# Patient Record
Sex: Female | Born: 1975 | Race: White | Hispanic: No | Smoking: Never smoker
Health system: Southern US, Community
[De-identification: ages and names within clinical notes are randomized; demographics above are authoritative.]

## PROBLEM LIST (undated history)

## (undated) ENCOUNTER — Inpatient Hospital Stay (HOSPITAL_COMMUNITY): Payer: Self-pay

## (undated) VITALS — BP 155/97 | HR 91 | Temp 98.1°F | Resp 18 | Ht 66.0 in | Wt 178.5 lb

## (undated) DIAGNOSIS — B999 Unspecified infectious disease: Secondary | ICD-10-CM

## (undated) DIAGNOSIS — R87619 Unspecified abnormal cytological findings in specimens from cervix uteri: Secondary | ICD-10-CM

## (undated) DIAGNOSIS — F319 Bipolar disorder, unspecified: Secondary | ICD-10-CM

## (undated) DIAGNOSIS — F32A Depression, unspecified: Secondary | ICD-10-CM

## (undated) DIAGNOSIS — F1010 Alcohol abuse, uncomplicated: Secondary | ICD-10-CM

## (undated) DIAGNOSIS — F329 Major depressive disorder, single episode, unspecified: Secondary | ICD-10-CM

## (undated) DIAGNOSIS — I471 Supraventricular tachycardia, unspecified: Secondary | ICD-10-CM

## (undated) DIAGNOSIS — IMO0002 Reserved for concepts with insufficient information to code with codable children: Secondary | ICD-10-CM

## (undated) DIAGNOSIS — N83209 Unspecified ovarian cyst, unspecified side: Secondary | ICD-10-CM

## (undated) DIAGNOSIS — A6000 Herpesviral infection of urogenital system, unspecified: Secondary | ICD-10-CM

## (undated) DIAGNOSIS — I10 Essential (primary) hypertension: Secondary | ICD-10-CM

## (undated) DIAGNOSIS — F259 Schizoaffective disorder, unspecified: Secondary | ICD-10-CM

## (undated) DIAGNOSIS — F419 Anxiety disorder, unspecified: Secondary | ICD-10-CM

## (undated) HISTORY — PX: OTHER SURGICAL HISTORY: SHX169

## (undated) HISTORY — DX: Depression, unspecified: F32.A

## (undated) HISTORY — DX: Herpesviral infection of urogenital system, unspecified: A60.00

## (undated) HISTORY — PX: KNEE SURGERY: SHX244

## (undated) HISTORY — PX: BREAST ENHANCEMENT SURGERY: SHX7

## (undated) HISTORY — DX: Essential (primary) hypertension: I10

## (undated) HISTORY — DX: Major depressive disorder, single episode, unspecified: F32.9

## (undated) HISTORY — PX: TONSILLECTOMY: SUR1361

## (undated) HISTORY — PX: WISDOM TOOTH EXTRACTION: SHX21

## (undated) HISTORY — PX: BREAST SURGERY: SHX581

---

## 1998-03-25 DIAGNOSIS — A6000 Herpesviral infection of urogenital system, unspecified: Secondary | ICD-10-CM

## 1998-03-25 HISTORY — DX: Herpesviral infection of urogenital system, unspecified: A60.00

## 1998-11-22 ENCOUNTER — Inpatient Hospital Stay (HOSPITAL_COMMUNITY): Admission: RE | Admit: 1998-11-22 | Discharge: 1998-11-22 | Payer: Self-pay | Admitting: Obstetrics

## 1998-12-04 ENCOUNTER — Encounter: Admission: RE | Admit: 1998-12-04 | Discharge: 1998-12-04 | Payer: Self-pay | Admitting: Internal Medicine

## 1998-12-07 ENCOUNTER — Encounter: Admission: RE | Admit: 1998-12-07 | Discharge: 1998-12-07 | Payer: Self-pay | Admitting: Obstetrics

## 1999-01-15 ENCOUNTER — Encounter: Admission: RE | Admit: 1999-01-15 | Discharge: 1999-01-15 | Payer: Self-pay | Admitting: Internal Medicine

## 2000-11-03 ENCOUNTER — Emergency Department (HOSPITAL_COMMUNITY): Admission: EM | Admit: 2000-11-03 | Discharge: 2000-11-03 | Payer: Self-pay | Admitting: Emergency Medicine

## 2000-11-03 ENCOUNTER — Encounter: Payer: Self-pay | Admitting: Emergency Medicine

## 2004-04-22 ENCOUNTER — Emergency Department: Payer: Self-pay | Admitting: Emergency Medicine

## 2007-04-23 ENCOUNTER — Emergency Department (HOSPITAL_COMMUNITY): Admission: EM | Admit: 2007-04-23 | Discharge: 2007-04-23 | Payer: Self-pay | Admitting: Emergency Medicine

## 2007-11-11 ENCOUNTER — Emergency Department (HOSPITAL_COMMUNITY): Admission: EM | Admit: 2007-11-11 | Discharge: 2007-11-11 | Payer: Self-pay | Admitting: Emergency Medicine

## 2008-06-07 ENCOUNTER — Emergency Department (HOSPITAL_COMMUNITY): Admission: EM | Admit: 2008-06-07 | Discharge: 2008-06-07 | Payer: Self-pay | Admitting: Internal Medicine

## 2008-11-15 ENCOUNTER — Emergency Department (HOSPITAL_COMMUNITY): Admission: EM | Admit: 2008-11-15 | Discharge: 2008-11-15 | Payer: Self-pay | Admitting: Emergency Medicine

## 2008-12-26 ENCOUNTER — Emergency Department (HOSPITAL_COMMUNITY): Admission: EM | Admit: 2008-12-26 | Discharge: 2008-12-26 | Payer: Self-pay | Admitting: Emergency Medicine

## 2009-01-31 ENCOUNTER — Inpatient Hospital Stay (HOSPITAL_COMMUNITY): Admission: AD | Admit: 2009-01-31 | Discharge: 2009-02-19 | Payer: Self-pay | Admitting: Psychiatry

## 2009-01-31 ENCOUNTER — Emergency Department (HOSPITAL_COMMUNITY): Admission: EM | Admit: 2009-01-31 | Discharge: 2009-01-31 | Payer: Self-pay | Admitting: Emergency Medicine

## 2009-01-31 ENCOUNTER — Ambulatory Visit: Payer: Self-pay | Admitting: Psychiatry

## 2009-02-05 ENCOUNTER — Emergency Department (HOSPITAL_COMMUNITY): Admission: EM | Admit: 2009-02-05 | Discharge: 2009-02-05 | Payer: Self-pay | Admitting: Emergency Medicine

## 2009-04-27 ENCOUNTER — Emergency Department (HOSPITAL_COMMUNITY): Admission: EM | Admit: 2009-04-27 | Discharge: 2009-04-28 | Payer: Self-pay | Admitting: Emergency Medicine

## 2009-04-28 ENCOUNTER — Inpatient Hospital Stay (HOSPITAL_COMMUNITY): Admission: RE | Admit: 2009-04-28 | Discharge: 2009-05-03 | Payer: Self-pay | Admitting: Psychiatry

## 2009-04-28 ENCOUNTER — Ambulatory Visit: Payer: Self-pay | Admitting: Psychiatry

## 2009-05-04 ENCOUNTER — Emergency Department (HOSPITAL_COMMUNITY): Admission: EM | Admit: 2009-05-04 | Discharge: 2009-05-05 | Payer: Self-pay | Admitting: Emergency Medicine

## 2009-07-01 ENCOUNTER — Emergency Department (HOSPITAL_COMMUNITY): Admission: EM | Admit: 2009-07-01 | Discharge: 2009-07-01 | Payer: Self-pay | Admitting: Emergency Medicine

## 2009-07-11 ENCOUNTER — Emergency Department (HOSPITAL_COMMUNITY): Admission: EM | Admit: 2009-07-11 | Discharge: 2009-07-11 | Payer: Self-pay | Admitting: Emergency Medicine

## 2009-07-11 ENCOUNTER — Inpatient Hospital Stay (HOSPITAL_COMMUNITY): Admission: EM | Admit: 2009-07-11 | Discharge: 2009-07-14 | Payer: Self-pay | Admitting: Psychiatry

## 2009-07-11 ENCOUNTER — Ambulatory Visit: Payer: Self-pay | Admitting: Psychiatry

## 2009-10-06 ENCOUNTER — Emergency Department (HOSPITAL_COMMUNITY): Admission: EM | Admit: 2009-10-06 | Discharge: 2009-10-07 | Payer: Self-pay | Admitting: Emergency Medicine

## 2009-10-07 ENCOUNTER — Ambulatory Visit: Payer: Self-pay | Admitting: Psychiatry

## 2009-10-17 ENCOUNTER — Emergency Department (HOSPITAL_COMMUNITY): Admission: EM | Admit: 2009-10-17 | Discharge: 2009-10-17 | Payer: Self-pay | Admitting: Emergency Medicine

## 2009-10-30 ENCOUNTER — Ambulatory Visit: Payer: Self-pay | Admitting: Psychiatry

## 2010-04-22 ENCOUNTER — Emergency Department (HOSPITAL_COMMUNITY)
Admission: EM | Admit: 2010-04-22 | Discharge: 2010-04-22 | Payer: Self-pay | Source: Home / Self Care | Admitting: Emergency Medicine

## 2010-05-24 ENCOUNTER — Emergency Department (HOSPITAL_COMMUNITY)
Admission: EM | Admit: 2010-05-24 | Discharge: 2010-05-24 | Disposition: A | Discharge status: Home / Self Care | Payer: Self-pay | Attending: Emergency Medicine | Admitting: Emergency Medicine

## 2010-05-24 DIAGNOSIS — K5289 Other specified noninfective gastroenteritis and colitis: Secondary | ICD-10-CM | POA: Insufficient documentation

## 2010-05-24 DIAGNOSIS — R5383 Other fatigue: Secondary | ICD-10-CM | POA: Insufficient documentation

## 2010-05-24 DIAGNOSIS — R630 Anorexia: Secondary | ICD-10-CM | POA: Insufficient documentation

## 2010-05-24 DIAGNOSIS — R1012 Left upper quadrant pain: Secondary | ICD-10-CM | POA: Insufficient documentation

## 2010-05-24 DIAGNOSIS — R509 Fever, unspecified: Secondary | ICD-10-CM | POA: Insufficient documentation

## 2010-05-24 DIAGNOSIS — R5381 Other malaise: Secondary | ICD-10-CM | POA: Insufficient documentation

## 2010-05-24 DIAGNOSIS — R112 Nausea with vomiting, unspecified: Secondary | ICD-10-CM | POA: Insufficient documentation

## 2010-05-24 DIAGNOSIS — R197 Diarrhea, unspecified: Secondary | ICD-10-CM | POA: Insufficient documentation

## 2010-05-24 LAB — BASIC METABOLIC PANEL
BUN: 8 mg/dL (ref 6–23)
CO2: 24 mEq/L (ref 19–32)
Chloride: 107 mEq/L (ref 96–112)
GFR calc non Af Amer: >60 mL/min (ref >60)
Sodium: 139 mEq/L (ref 135–145)

## 2010-06-09 LAB — CBC
Hemoglobin: 13.5 g/dL (ref 12.0–15.0)
MCHC: 34.9 g/dL (ref 30.0–36.0)
MCV: 91.8 fL (ref 78.0–100.0)
Platelets: 279 10*3/uL (ref 150–400)
WBC: 8.5 10*3/uL (ref 4.0–10.5)

## 2010-06-09 LAB — ETHANOL: Alcohol, Ethyl (B): 156 mg/dL — ABNORMAL HIGH (ref 0–10)

## 2010-06-09 LAB — DIFFERENTIAL
Basophils Relative: 0 % (ref 0–1)
Eosinophils Absolute: 0.1 10*3/uL (ref 0.0–0.7)
Lymphs Abs: 1.7 10*3/uL (ref 0.7–4.0)
Monocytes Absolute: 0.3 10*3/uL (ref 0.1–1.0)
Neutro Abs: 6.4 10*3/uL (ref 1.7–7.7)
Neutrophils Relative %: 76 % (ref 43–77)

## 2010-06-09 LAB — RAPID URINE DRUG SCREEN, HOSP PERFORMED
Benzodiazepines: NOT DETECTED
Cocaine: POSITIVE — AB

## 2010-06-09 LAB — POCT I-STAT, CHEM 8
Calcium, Ion: 1.08 mmol/L — ABNORMAL LOW (ref 1.12–1.32)
Potassium: 3.8 mEq/L (ref 3.5–5.1)
TCO2: 20 mmol/L (ref 0–100)

## 2010-06-09 LAB — TRICYCLICS SCREEN, URINE: TCA Scrn: NOT DETECTED

## 2010-06-12 LAB — CBC
Hemoglobin: 15.2 g/dL — ABNORMAL HIGH (ref 12.0–15.0)
MCHC: 33.5 g/dL (ref 30.0–36.0)
WBC: 11.5 10*3/uL — ABNORMAL HIGH (ref 4.0–10.5)

## 2010-06-12 LAB — RAPID URINE DRUG SCREEN, HOSP PERFORMED
Amphetamines: NOT DETECTED
Barbiturates: NOT DETECTED
Benzodiazepines: NOT DETECTED

## 2010-06-12 LAB — COMPREHENSIVE METABOLIC PANEL
AST: 30 U/L (ref 0–37)
Albumin: 5 g/dL (ref 3.5–5.2)
Alkaline Phosphatase: 69 U/L (ref 39–117)
BUN: 6 mg/dL (ref 6–23)
CO2: 24 mEq/L (ref 19–32)
Creatinine, Ser: 0.8 mg/dL (ref 0.4–1.2)
Glucose, Bld: 98 mg/dL (ref 70–99)
Potassium: 3.5 mEq/L (ref 3.5–5.1)
Sodium: 141 mEq/L (ref 135–145)
Total Protein: 8.5 g/dL — ABNORMAL HIGH (ref 6.0–8.3)

## 2010-06-13 LAB — COMPREHENSIVE METABOLIC PANEL
Albumin: 4.6 g/dL (ref 3.5–5.2)
BUN: 8 mg/dL (ref 6–23)
CO2: 24 mEq/L (ref 19–32)
Calcium: 9.6 mg/dL (ref 8.4–10.5)
Chloride: 101 mEq/L (ref 96–112)
Glucose, Bld: 82 mg/dL (ref 70–99)
Sodium: 138 mEq/L (ref 135–145)

## 2010-06-13 LAB — CBC
HCT: 42.8 % (ref 36.0–46.0)
Hemoglobin: 14.9 g/dL (ref 12.0–15.0)
WBC: 14 10*3/uL — ABNORMAL HIGH (ref 4.0–10.5)

## 2010-06-13 LAB — DIFFERENTIAL
Basophils Absolute: 0 10*3/uL (ref 0.0–0.1)
Eosinophils Absolute: 0.2 10*3/uL (ref 0.0–0.7)
Eosinophils Relative: 1 % (ref 0–5)
Monocytes Relative: 6 % (ref 3–12)

## 2010-06-13 LAB — BASIC METABOLIC PANEL
CO2: 28 mEq/L (ref 19–32)
Calcium: 8.5 mg/dL (ref 8.4–10.5)
Creatinine, Ser: 0.96 mg/dL (ref 0.4–1.2)
GFR calc Af Amer: >60 mL/min (ref >60)
GFR calc non Af Amer: >60 mL/min (ref >60)
Sodium: 137 mEq/L (ref 135–145)

## 2010-06-13 LAB — URINALYSIS, ROUTINE W REFLEX MICROSCOPIC
Bilirubin Urine: NEGATIVE
Bilirubin Urine: NEGATIVE
Glucose, UA: NEGATIVE mg/dL
Ketones, ur: NEGATIVE mg/dL
Ketones, ur: NEGATIVE mg/dL
Nitrite: NEGATIVE
Protein, ur: NEGATIVE mg/dL
Protein, ur: NEGATIVE mg/dL
Specific Gravity, Urine: 1.007 (ref 1.005–1.030)
Urobilinogen, UA: 0.2 mg/dL (ref 0.0–1.0)
pH: 6 (ref 5.0–8.0)
pH: 6 (ref 5.0–8.0)

## 2010-06-13 LAB — URINE MICROSCOPIC-ADD ON

## 2010-06-13 LAB — SALICYLATE LEVEL: Salicylate Lvl: <4 mg/dL (ref 2.8–20.0)

## 2010-06-13 LAB — GLUCOSE, CAPILLARY: Glucose-Capillary: 104 mg/dL — ABNORMAL HIGH (ref 70–99)

## 2010-06-13 LAB — RAPID URINE DRUG SCREEN, HOSP PERFORMED
Barbiturates: NOT DETECTED
Benzodiazepines: POSITIVE — AB
Cocaine: NOT DETECTED
Cocaine: POSITIVE — AB
Tetrahydrocannabinol: NOT DETECTED

## 2010-06-13 LAB — POCT PREGNANCY, URINE: Preg Test, Ur: NEGATIVE

## 2010-06-13 LAB — PREGNANCY, URINE
Preg Test, Ur: NEGATIVE
Preg Test, Ur: NEGATIVE

## 2010-06-13 LAB — ACETAMINOPHEN LEVEL: Acetaminophen (Tylenol), Serum: <10 ug/mL — ABNORMAL LOW (ref 10–30)

## 2010-06-14 LAB — RAPID STREP SCREEN (MED CTR MEBANE ONLY): Streptococcus, Group A Screen (Direct): NEGATIVE

## 2010-06-27 LAB — URINALYSIS, ROUTINE W REFLEX MICROSCOPIC
Bilirubin Urine: NEGATIVE
Bilirubin Urine: NEGATIVE
Glucose, UA: NEGATIVE mg/dL
Glucose, UA: NEGATIVE mg/dL
Hgb urine dipstick: NEGATIVE
Ketones, ur: NEGATIVE mg/dL
Ketones, ur: NEGATIVE mg/dL
Leukocytes, UA: NEGATIVE
Nitrite: NEGATIVE
Protein, ur: NEGATIVE mg/dL
Protein, ur: NEGATIVE mg/dL
Specific Gravity, Urine: 1.009 (ref 1.005–1.030)
Urobilinogen, UA: 0.2 mg/dL (ref 0.0–1.0)
pH: 7.5 (ref 5.0–8.0)
pH: 6.5 (ref 5.0–8.0)

## 2010-06-27 LAB — BASIC METABOLIC PANEL WITH GFR
BUN: 12 mg/dL (ref 6–23)
Chloride: 103 meq/L (ref 96–112)
GFR calc Af Amer: >60 mL/min (ref >60)
Potassium: 3.7 meq/L (ref 3.5–5.1)
Sodium: 136 meq/L (ref 135–145)

## 2010-06-27 LAB — HEPATIC FUNCTION PANEL
Alkaline Phosphatase: 52 U/L (ref 39–117)
Bilirubin, Direct: 0.2 mg/dL (ref 0.0–0.3)
Indirect Bilirubin: 0.4 mg/dL (ref 0.3–0.9)
Total Protein: 6.2 g/dL (ref 6.0–8.3)

## 2010-06-27 LAB — DIFFERENTIAL
Basophils Absolute: 0.1 K/uL (ref 0.0–0.1)
Basophils Relative: 1 % (ref 0–1)
Eosinophils Absolute: 0.2 10*3/uL (ref 0.0–0.7)
Eosinophils Relative: 3 % (ref 0–5)
Lymphocytes Relative: 26 % (ref 12–46)
Lymphs Abs: 2.1 10*3/uL (ref 0.7–4.0)
Monocytes Absolute: 0.4 K/uL (ref 0.1–1.0)
Monocytes Relative: 5 % (ref 3–12)
Neutro Abs: 5.2 K/uL (ref 1.7–7.7)
Neutrophils Relative %: 66 % (ref 43–77)

## 2010-06-27 LAB — CBC
HCT: 42 % (ref 36.0–46.0)
Hemoglobin: 14.6 g/dL (ref 12.0–15.0)
MCHC: 34.8 g/dL (ref 30.0–36.0)
MCV: 91.5 fL (ref 78.0–100.0)
Platelets: 175 K/uL (ref 150–400)
RBC: 4.59 MIL/uL (ref 3.87–5.11)
RDW: 13 % (ref 11.5–15.5)
WBC: 8 K/uL (ref 4.0–10.5)

## 2010-06-27 LAB — BASIC METABOLIC PANEL
CO2: 23 mEq/L (ref 19–32)
Calcium: 9 mg/dL (ref 8.4–10.5)
Creatinine, Ser: 0.82 mg/dL (ref 0.4–1.2)
GFR calc non Af Amer: >60 mL/min (ref >60)
Glucose, Bld: 90 mg/dL (ref 70–99)

## 2010-06-27 LAB — HEMOCCULT GUIAC POC 1CARD (OFFICE): Fecal Occult Bld: POSITIVE

## 2010-06-27 LAB — PREGNANCY, URINE: Preg Test, Ur: NEGATIVE

## 2010-06-27 LAB — RAPID URINE DRUG SCREEN, HOSP PERFORMED
Amphetamines: NOT DETECTED
Barbiturates: NOT DETECTED
Benzodiazepines: POSITIVE — AB
Cocaine: NOT DETECTED
Opiates: NOT DETECTED
Tetrahydrocannabinol: NOT DETECTED

## 2010-06-27 LAB — URINE MICROSCOPIC-ADD ON

## 2010-06-27 LAB — ETHANOL: Alcohol, Ethyl (B): 157 mg/dL — ABNORMAL HIGH (ref 0–10)

## 2010-06-27 LAB — ACETAMINOPHEN LEVEL: Acetaminophen (Tylenol), Serum: <10 ug/mL — ABNORMAL LOW (ref 10–30)

## 2010-06-27 LAB — SALICYLATE LEVEL
Salicylate Lvl: 16.6 mg/dL (ref 2.8–20.0)
Salicylate Lvl: 20.1 mg/dL — ABNORMAL HIGH (ref 2.8–20.0)

## 2010-06-28 LAB — BASIC METABOLIC PANEL
CO2: 27 mEq/L (ref 19–32)
Calcium: 9 mg/dL (ref 8.4–10.5)
Creatinine, Ser: 0.74 mg/dL (ref 0.4–1.2)
GFR calc Af Amer: >60 mL/min (ref >60)
Sodium: 138 mEq/L (ref 135–145)

## 2010-06-28 LAB — RAPID URINE DRUG SCREEN, HOSP PERFORMED
Amphetamines: NOT DETECTED
Benzodiazepines: POSITIVE — AB
Cocaine: NOT DETECTED
Opiates: NOT DETECTED
Tetrahydrocannabinol: NOT DETECTED

## 2010-06-28 LAB — DIFFERENTIAL
Lymphocytes Relative: 16 % (ref 12–46)
Lymphs Abs: 2 10*3/uL (ref 0.7–4.0)
Monocytes Absolute: 0.7 10*3/uL (ref 0.1–1.0)
Monocytes Relative: 5 % (ref 3–12)
Neutro Abs: 10 10*3/uL — ABNORMAL HIGH (ref 1.7–7.7)
Neutrophils Relative %: 79 % — ABNORMAL HIGH (ref 43–77)

## 2010-06-28 LAB — URINALYSIS, ROUTINE W REFLEX MICROSCOPIC
Bilirubin Urine: NEGATIVE
Ketones, ur: NEGATIVE mg/dL
Nitrite: NEGATIVE
Protein, ur: NEGATIVE mg/dL
Urobilinogen, UA: 0.2 mg/dL (ref 0.0–1.0)
pH: 6 (ref 5.0–8.0)

## 2010-06-28 LAB — CBC
Hemoglobin: 13.6 g/dL (ref 12.0–15.0)
RBC: 4.29 MIL/uL (ref 3.87–5.11)
WBC: 12.7 10*3/uL — ABNORMAL HIGH (ref 4.0–10.5)

## 2010-06-28 LAB — ETHANOL: Alcohol, Ethyl (B): 108 mg/dL — ABNORMAL HIGH (ref 0–10)

## 2010-06-28 LAB — PREGNANCY, URINE: Preg Test, Ur: NEGATIVE

## 2010-06-30 LAB — POCT I-STAT, CHEM 8
BUN: 9 mg/dL (ref 6–23)
Creatinine, Ser: 0.8 mg/dL (ref 0.4–1.2)
Potassium: 3.6 mEq/L (ref 3.5–5.1)
Sodium: 139 mEq/L (ref 135–145)

## 2010-06-30 LAB — URINALYSIS, ROUTINE W REFLEX MICROSCOPIC
Bilirubin Urine: NEGATIVE
Ketones, ur: 15 mg/dL — AB
Specific Gravity, Urine: 1.017 (ref 1.005–1.030)
Urobilinogen, UA: 0.2 mg/dL (ref 0.0–1.0)

## 2010-06-30 LAB — RAPID URINE DRUG SCREEN, HOSP PERFORMED
Amphetamines: NOT DETECTED
Barbiturates: NOT DETECTED
Cocaine: NOT DETECTED
Opiates: NOT DETECTED

## 2010-06-30 LAB — URINE MICROSCOPIC-ADD ON

## 2010-07-05 LAB — URINALYSIS, ROUTINE W REFLEX MICROSCOPIC
Glucose, UA: NEGATIVE mg/dL
Specific Gravity, Urine: 1.016 (ref 1.005–1.030)

## 2010-07-05 LAB — WET PREP, GENITAL
Trich, Wet Prep: NONE SEEN
Yeast Wet Prep HPF POC: NONE SEEN

## 2010-07-05 LAB — GC/CHLAMYDIA PROBE AMP, GENITAL: GC Probe Amp, Genital: NEGATIVE

## 2010-07-05 LAB — URINE MICROSCOPIC-ADD ON

## 2010-07-05 LAB — POCT I-STAT, CHEM 8
BUN: 9 mg/dL (ref 6–23)
Creatinine, Ser: 1 mg/dL (ref 0.4–1.2)
Hemoglobin: 13.9 g/dL (ref 12.0–15.0)
Potassium: 4 mEq/L (ref 3.5–5.1)
Sodium: 138 mEq/L (ref 135–145)

## 2010-07-05 LAB — RAPID URINE DRUG SCREEN, HOSP PERFORMED: Barbiturates: NOT DETECTED

## 2010-09-12 ENCOUNTER — Emergency Department (HOSPITAL_COMMUNITY): Payer: Self-pay

## 2010-09-12 ENCOUNTER — Emergency Department (HOSPITAL_COMMUNITY)
Admission: EM | Admit: 2010-09-12 | Discharge: 2010-09-12 | Disposition: A | Discharge status: Home / Self Care | Payer: Self-pay | Attending: Emergency Medicine | Admitting: Emergency Medicine

## 2010-09-12 DIAGNOSIS — N739 Female pelvic inflammatory disease, unspecified: Secondary | ICD-10-CM | POA: Insufficient documentation

## 2010-09-12 DIAGNOSIS — R0602 Shortness of breath: Secondary | ICD-10-CM | POA: Insufficient documentation

## 2010-09-12 DIAGNOSIS — K5730 Diverticulosis of large intestine without perforation or abscess without bleeding: Secondary | ICD-10-CM | POA: Insufficient documentation

## 2010-09-12 DIAGNOSIS — R109 Unspecified abdominal pain: Secondary | ICD-10-CM | POA: Insufficient documentation

## 2010-09-12 DIAGNOSIS — R911 Solitary pulmonary nodule: Secondary | ICD-10-CM | POA: Insufficient documentation

## 2010-09-12 LAB — URINALYSIS, ROUTINE W REFLEX MICROSCOPIC
Glucose, UA: NEGATIVE mg/dL
Ketones, ur: NEGATIVE mg/dL
Leukocytes, UA: NEGATIVE
pH: 5.5 (ref 5.0–8.0)

## 2010-09-12 LAB — DIFFERENTIAL
Basophils Absolute: 0.1 10*3/uL (ref 0.0–0.1)
Basophils Relative: 1 % (ref 0–1)
Eosinophils Relative: 1 % (ref 0–5)
Monocytes Absolute: 0.6 10*3/uL (ref 0.1–1.0)
Neutro Abs: 4.9 10*3/uL (ref 1.7–7.7)

## 2010-09-12 LAB — COMPREHENSIVE METABOLIC PANEL
ALT: 19 U/L (ref 0–35)
AST: 19 U/L (ref 0–37)
Albumin: 3.7 g/dL (ref 3.5–5.2)
Alkaline Phosphatase: 71 U/L (ref 39–117)
Glucose, Bld: 103 mg/dL — ABNORMAL HIGH (ref 70–99)
Potassium: 3.6 mEq/L (ref 3.5–5.1)
Sodium: 140 mEq/L (ref 135–145)
Total Protein: 6.7 g/dL (ref 6.0–8.3)

## 2010-09-12 LAB — WET PREP, GENITAL
Trich, Wet Prep: NONE SEEN
Yeast Wet Prep HPF POC: NONE SEEN

## 2010-09-12 LAB — CBC
MCHC: 35.4 g/dL (ref 30.0–36.0)
Platelets: 255 10*3/uL (ref 150–400)
RDW: 12.5 % (ref 11.5–15.5)

## 2010-09-12 LAB — URINE MICROSCOPIC-ADD ON

## 2010-09-12 MED ORDER — IOHEXOL 300 MG/ML  SOLN
80.0000 mL | Freq: Once | INTRAMUSCULAR | Status: AC | PRN
  Administered 2010-09-12: 80 mL via INTRAVENOUS

## 2010-09-21 ENCOUNTER — Emergency Department (HOSPITAL_COMMUNITY): Payer: Self-pay

## 2010-09-21 ENCOUNTER — Emergency Department (HOSPITAL_COMMUNITY)
Admission: EM | Admit: 2010-09-21 | Discharge: 2010-09-21 | Disposition: A | Discharge status: Home / Self Care | Payer: Self-pay | Attending: Emergency Medicine | Admitting: Emergency Medicine

## 2010-09-21 DIAGNOSIS — R51 Headache: Secondary | ICD-10-CM | POA: Insufficient documentation

## 2010-09-21 DIAGNOSIS — N390 Urinary tract infection, site not specified: Secondary | ICD-10-CM | POA: Insufficient documentation

## 2010-09-21 LAB — BASIC METABOLIC PANEL
Chloride: 102 mEq/L (ref 96–112)
Creatinine, Ser: 0.87 mg/dL (ref 0.50–1.10)
GFR calc Af Amer: >60 mL/min (ref >60)
GFR calc non Af Amer: >60 mL/min (ref >60)

## 2010-09-21 LAB — URINE MICROSCOPIC-ADD ON

## 2010-09-21 LAB — CBC
MCHC: 35.1 g/dL (ref 30.0–36.0)
MCV: 89.9 fL (ref 78.0–100.0)
Platelets: 303 10*3/uL (ref 150–400)
RDW: 12.9 % (ref 11.5–15.5)
WBC: 9.5 10*3/uL (ref 4.0–10.5)

## 2010-09-21 LAB — DIFFERENTIAL
Basophils Absolute: 0 10*3/uL (ref 0.0–0.1)
Eosinophils Absolute: 0.3 10*3/uL (ref 0.0–0.7)
Eosinophils Relative: 3 % (ref 0–5)
Lymphs Abs: 2 10*3/uL (ref 0.7–4.0)
Monocytes Absolute: 1 10*3/uL (ref 0.1–1.0)

## 2010-09-21 LAB — URINALYSIS, ROUTINE W REFLEX MICROSCOPIC
Bilirubin Urine: NEGATIVE
Specific Gravity, Urine: 1.025 (ref 1.005–1.030)
pH: 5.5 (ref 5.0–8.0)

## 2010-09-21 LAB — POCT PREGNANCY, URINE: Preg Test, Ur: NEGATIVE

## 2010-10-14 ENCOUNTER — Emergency Department (HOSPITAL_COMMUNITY)
Admission: EM | Admit: 2010-10-14 | Discharge: 2010-10-14 | Disposition: A | Discharge status: Nursing Home (Includes ICF) | Payer: No Typology Code available for payment source | Attending: Emergency Medicine | Admitting: Emergency Medicine

## 2010-10-14 DIAGNOSIS — N949 Unspecified condition associated with female genital organs and menstrual cycle: Secondary | ICD-10-CM | POA: Insufficient documentation

## 2010-10-14 DIAGNOSIS — R109 Unspecified abdominal pain: Secondary | ICD-10-CM | POA: Insufficient documentation

## 2010-10-22 ENCOUNTER — Emergency Department (HOSPITAL_COMMUNITY)
Admission: EM | Admit: 2010-10-22 | Discharge: 2010-10-22 | Discharge status: Against Medical Advice | Payer: Self-pay | Attending: Emergency Medicine | Admitting: Emergency Medicine

## 2010-10-23 ENCOUNTER — Emergency Department (HOSPITAL_COMMUNITY): Payer: Self-pay

## 2010-10-23 ENCOUNTER — Emergency Department (HOSPITAL_COMMUNITY)
Admission: EM | Admit: 2010-10-23 | Discharge: 2010-10-23 | Disposition: A | Discharge status: Home / Self Care | Payer: Self-pay | Attending: Emergency Medicine | Admitting: Emergency Medicine

## 2010-10-23 DIAGNOSIS — F1910 Other psychoactive substance abuse, uncomplicated: Secondary | ICD-10-CM | POA: Insufficient documentation

## 2010-10-23 DIAGNOSIS — R110 Nausea: Secondary | ICD-10-CM | POA: Insufficient documentation

## 2010-10-23 DIAGNOSIS — M25539 Pain in unspecified wrist: Secondary | ICD-10-CM | POA: Insufficient documentation

## 2010-10-23 LAB — URINALYSIS, ROUTINE W REFLEX MICROSCOPIC
Bilirubin Urine: NEGATIVE
Glucose, UA: NEGATIVE mg/dL
Ketones, ur: NEGATIVE mg/dL
Leukocytes, UA: NEGATIVE
Nitrite: NEGATIVE
Specific Gravity, Urine: 1.028 (ref 1.005–1.030)
pH: 7 (ref 5.0–8.0)

## 2010-10-23 LAB — CBC
HCT: 38.2 % (ref 36.0–46.0)
MCHC: 35.6 g/dL (ref 30.0–36.0)
Platelets: 312 10*3/uL (ref 150–400)
RDW: 12.2 % (ref 11.5–15.5)
WBC: 12.5 10*3/uL — ABNORMAL HIGH (ref 4.0–10.5)

## 2010-10-23 LAB — DIFFERENTIAL
Basophils Absolute: 0 10*3/uL (ref 0.0–0.1)
Basophils Relative: 0 % (ref 0–1)
Eosinophils Absolute: 0 10*3/uL (ref 0.0–0.7)
Eosinophils Relative: 0 % (ref 0–5)
Lymphocytes Relative: 19 % (ref 12–46)
Monocytes Absolute: 0.8 10*3/uL (ref 0.1–1.0)

## 2010-10-23 LAB — POCT PREGNANCY, URINE: Preg Test, Ur: NEGATIVE

## 2010-10-23 LAB — BASIC METABOLIC PANEL
Calcium: 9.1 mg/dL (ref 8.4–10.5)
GFR calc Af Amer: >60 mL/min (ref >60)
GFR calc non Af Amer: >60 mL/min (ref >60)
Potassium: 3.5 mEq/L (ref 3.5–5.1)
Sodium: 135 mEq/L (ref 135–145)

## 2010-10-23 LAB — RAPID URINE DRUG SCREEN, HOSP PERFORMED
Barbiturates: NOT DETECTED
Benzodiazepines: NOT DETECTED
Cocaine: NOT DETECTED

## 2010-10-23 LAB — URINE MICROSCOPIC-ADD ON

## 2010-10-23 LAB — ETHANOL: Alcohol, Ethyl (B): 68 mg/dL — ABNORMAL HIGH (ref 0–11)

## 2010-11-26 ENCOUNTER — Emergency Department (HOSPITAL_COMMUNITY)
Admission: EM | Admit: 2010-11-26 | Discharge: 2010-11-26 | Disposition: A | Discharge status: Home / Self Care | Payer: Self-pay | Attending: Emergency Medicine | Admitting: Emergency Medicine

## 2010-11-26 DIAGNOSIS — L02219 Cutaneous abscess of trunk, unspecified: Secondary | ICD-10-CM | POA: Insufficient documentation

## 2010-11-26 DIAGNOSIS — R21 Rash and other nonspecific skin eruption: Secondary | ICD-10-CM | POA: Insufficient documentation

## 2010-11-27 ENCOUNTER — Emergency Department (HOSPITAL_COMMUNITY)
Admission: EM | Admit: 2010-11-27 | Discharge: 2010-11-28 | Disposition: A | Discharge status: Other Acute Inpatient Hospital | Payer: Self-pay | Attending: Emergency Medicine | Admitting: Emergency Medicine

## 2010-11-27 DIAGNOSIS — F489 Nonpsychotic mental disorder, unspecified: Secondary | ICD-10-CM | POA: Insufficient documentation

## 2010-11-27 DIAGNOSIS — S61509A Unspecified open wound of unspecified wrist, initial encounter: Secondary | ICD-10-CM | POA: Insufficient documentation

## 2010-11-27 DIAGNOSIS — X789XXA Intentional self-harm by unspecified sharp object, initial encounter: Secondary | ICD-10-CM | POA: Insufficient documentation

## 2010-11-27 LAB — CBC
HCT: 38.8 % (ref 36.0–46.0)
MCHC: 35.3 g/dL (ref 30.0–36.0)
Platelets: 291 10*3/uL (ref 150–400)
RDW: 11.9 % (ref 11.5–15.5)
WBC: 7.7 10*3/uL (ref 4.0–10.5)

## 2010-11-27 LAB — DIFFERENTIAL
Basophils Absolute: 0 10*3/uL (ref 0.0–0.1)
Basophils Relative: 0 % (ref 0–1)
Eosinophils Absolute: 0.1 10*3/uL (ref 0.0–0.7)
Monocytes Absolute: 0.2 10*3/uL (ref 0.1–1.0)
Monocytes Relative: 3 % (ref 3–12)
Neutro Abs: 5.5 10*3/uL (ref 1.7–7.7)
Neutrophils Relative %: 71 % (ref 43–77)

## 2010-11-27 LAB — RAPID URINE DRUG SCREEN, HOSP PERFORMED
Amphetamines: NOT DETECTED
Benzodiazepines: NOT DETECTED
Cocaine: POSITIVE — AB

## 2010-11-27 LAB — COMPREHENSIVE METABOLIC PANEL
AST: 21 U/L (ref 0–37)
Albumin: 3.5 g/dL (ref 3.5–5.2)
Alkaline Phosphatase: 90 U/L (ref 39–117)
BUN: 10 mg/dL (ref 6–23)
Chloride: 106 mEq/L (ref 96–112)
Creatinine, Ser: 0.79 mg/dL (ref 0.50–1.10)
Potassium: 3.5 mEq/L (ref 3.5–5.1)
Total Bilirubin: 0.1 mg/dL — ABNORMAL LOW (ref 0.3–1.2)
Total Protein: 7.3 g/dL (ref 6.0–8.3)

## 2010-11-27 LAB — POCT PREGNANCY, URINE: Preg Test, Ur: NEGATIVE

## 2010-11-27 LAB — ETHANOL: Alcohol, Ethyl (B): 270 mg/dL — ABNORMAL HIGH (ref 0–11)

## 2010-11-28 ENCOUNTER — Inpatient Hospital Stay (HOSPITAL_COMMUNITY)
Admission: AD | Admit: 2010-11-28 | Discharge: 2010-12-03 | DRG: 885 | Disposition: A | Discharge status: Home / Self Care | Payer: PRIVATE HEALTH INSURANCE | Source: Ambulatory Visit | Attending: Psychiatry | Admitting: Psychiatry

## 2010-11-28 DIAGNOSIS — Z5900 Homelessness unspecified: Secondary | ICD-10-CM

## 2010-11-28 DIAGNOSIS — R51 Headache: Secondary | ICD-10-CM

## 2010-11-28 DIAGNOSIS — Z880 Allergy status to penicillin: Secondary | ICD-10-CM

## 2010-11-28 DIAGNOSIS — F19939 Other psychoactive substance use, unspecified with withdrawal, unspecified: Secondary | ICD-10-CM

## 2010-11-28 DIAGNOSIS — R45851 Suicidal ideations: Secondary | ICD-10-CM

## 2010-11-28 DIAGNOSIS — L089 Local infection of the skin and subcutaneous tissue, unspecified: Secondary | ICD-10-CM

## 2010-11-28 DIAGNOSIS — F411 Generalized anxiety disorder: Secondary | ICD-10-CM

## 2010-11-28 DIAGNOSIS — F39 Unspecified mood [affective] disorder: Principal | ICD-10-CM

## 2010-11-28 DIAGNOSIS — F1020 Alcohol dependence, uncomplicated: Secondary | ICD-10-CM

## 2010-11-28 DIAGNOSIS — Z111 Encounter for screening for respiratory tuberculosis: Secondary | ICD-10-CM

## 2010-11-28 DIAGNOSIS — F1120 Opioid dependence, uncomplicated: Secondary | ICD-10-CM

## 2010-11-29 DIAGNOSIS — F1920 Other psychoactive substance dependence, uncomplicated: Secondary | ICD-10-CM

## 2010-11-29 DIAGNOSIS — F1020 Alcohol dependence, uncomplicated: Secondary | ICD-10-CM

## 2010-12-03 NOTE — Discharge Summary (Signed)
NAMERYENN, HOWETH NO.:  [id number]  MEDICAL RECORD NO.:  [id number]  LOCATION:  0301                          FACILITY:  [facility]  PHYSICIAN:  Orson Aloe, MD       DATE OF BIRTH:  May 29, 1975  DATE OF ADMISSION:  11/28/2010 DATE OF DISCHARGE:  12/03/2010                              DISCHARGE SUMMARY   REASON FOR ADMISSION:  This the fourth [facility] admission since 2010 for Megan Edwards who has previously been completely abstinent for about 8 months from all of her substances and then gradually relapsed.  She unclear now about how long she has been using substances.  She was picked up by the police and was intoxicated and found to have a BAL of 270 and was combative and agitated in the emergency room.  While she was there, she called her mother and told her mother that she is going to kill herself as she left the emergency room.  She was transferred to [facility] for ongoing treatment.  She reports that she has been using cocaine a few times in the past week but this is very atypical for her and she does not typically  abuse  cocaine.  She has been using approximately 20 tablets of 10 mg Norco per day, crushing and snorting them several times several weeks ago and she did not use IV heroin.  She has been drinking large amounts of alcohol, as much as she can get her hands on.  She denies any legal problems.  She is currently homeless. She denies suicidal thoughts today.  Significant laboratory is urine screen was positive for cocaine. Alcohol was BAL.  CBC was normal.  She does report that she has had some outpatient testing for HIV and some other STDs and tests which were negative.  DISCHARGE DIAGNOSES:  AXIS I:  Mood disorder not otherwise specified, alcohol dependence, opiate dependence. AXIS II:  Deferred. AXIS III:  Headache not otherwise specified, rule out opiate withdrawal headache and skin infection not otherwise specified. AXIS IV:  Severe social  issues and housing issues. AXIS V:  50.  HOSPITAL COURSE:  During the hospital course, the patient was placed on detox and noted to have a good response to that.  She received Zofran for nausea and was also started on Tegretol 100 mg twice a day and was increased to 200 mg twice a day for mood regulation.  She had responded well to Tegretol in the past.  For pain, depression and sleep, she was placed on Elavil 25 mg at bedtime with good results.  Trazodone was also used for sleep and it was considered helpful.  Imitrex 50 mg tablets was ordered for headaches.  Actually Fioricet was considered at one point and Imitrex subcu was given for headache.  CONDITION ON DISCHARGE:  She denied any suicidal or homicidal ideation. She denied hallucinations, illusions, delusions.  She had good eye contact.  She was able to focus for lengthy interchange and interview and as well as in groups.  She had clear goal-directed thoughts.  She had natural conversational speech volume, rate and tone.  She is oriented x4, had recent, remote memory which was intact.  Judgment  was considered able to construct a reasonably safe discharge placement by applying and going to the [facility] program.  [facility]ight was improved from admission.  DISCHARGE RECOMMENDATIONS: 1. Resume typical activities. 2. Resume typical diet. 3. Continue amitriptyline 25 mg at bedtime, Tegretol 10 mg twice a day     for mood stabilization as well as Keflex 500 mg 4 times a day to     finish the prescription and doxycycline 100 mg also to finish the     prescription at a twice a day schedule and trazodone 100 mg at     bedtime.  She was to stop taking Lamictal and Klonopin.  She was     also follow up at the [facility] program and received assistance in     getting transported there.          ______________________________ Orson Aloe, MD     EW/MEDQ  D:  12/03/2010  T:  12/03/2010  Job:  [id number]  Electronically Signed by Orson Aloe  on  12/03/2010 05:18:12 PM

## 2010-12-03 NOTE — Assessment & Plan Note (Signed)
NAMEDENISS, WORMLEY NO.:  [id number]  MEDICAL RECORD NO.:  [id number]  LOCATION:  0301                          FACILITY:  [facility]  PHYSICIAN:  Orson Aloe, MD       DATE OF BIRTH:  1975/05/05  DATE OF ADMISSION:  11/28/2010 DATE OF DISCHARGE:                      PSYCHIATRIC ADMISSION ASSESSMENT   IDENTIFICATION:  This is a 35 year old Caucasian female.  This is a voluntary admission.  HISTORY OF PRESENT ILLNESS:  This the fourth [facility] admission since 2010 for [facility] who had  previously been completely abstinent for about 8 months from all substances and then gradually relapsed.  She is unclear now about how long she has been using substances.  She [place] police [place] the [place]  intoxicated and found to have an alcohol level of 270 mg/dL and was combative and agitated in the emergency room.  While there she called her mother and told her mother that she was going to kill herself as soon as she left the emergency room.  She was transferred to [facility]  for ongoing treatment.  Shadoe reports that she used cocaine a few times in the past week but this is very atypical for her and she does not typically abuse cocaine. She has been using approximately 20 tablets of 10 mg Norco per date crushing and snorting although several weeks ago she did use some IV heroin..  She has been drinking large amounts of alcohol, as much as she could get her hands on.  She denies any legal problems.  She is currently homeless.  She denies suicidal thoughts today.  PAST PSYCHIATRIC HISTORY:  Fourth [facility] admission.  No current outpatient treatment. Last [facility] admission was in April 2011. After that stay she went to the [facility] program, then came back at her own apartment and was working, had her life organized and was able to stay abstinent an additional 8 months.  She has a history of IV heroin abuse and alcohol abuse.  Previous medication trials include Prozac which she  reports worked quite well for her,  helped stabilize her mood and reinforced her sobriety.  She has also had trazodone in the past for sleep.  SOCIAL HISTORY:  This is a single Caucasian female with no current legal charges, currently homeless.  Mother does live in the area and is supportive.  Shamicka is currently unemployed.  She has no children.  FAMILY HISTORY:  Not available.  MEDICAL HISTORY:  No regular primary care provider. Current medical problems are skin infection NOS.  MEDICATIONS:  Home medications:  Keflex 500 mg four times a day, duration unclear,  and doxycycline 100 mg b.i.d. duration unclear.  ALLERGIES:  DRUG ALLERGIES ARE PENICILLIN AND TRAMADOL.  PHYSICAL EXAMINATION:  Positive physical findings:  This is a normally- developed Caucasian female, fair skin with light blonde hair.  She weighs 63 kg,  5 feet 6 inches tall.  No abnormal movements.  Normal gait.  She does not appear to be in acute withdrawal but is complaining of significant headache at this time which  she reports is typical for her with drug withdrawal.  She scores the headache a 7-8/10 with photophobia and phonophobia.  Full  physical exam was done in the emergency room and is noted in the record.  Her urine drug screen was positive for cocaine.  Alcohol level 270 mg/dL and CBC normal.  She does report that she has had some outpatient testing for HIV and other STDs and tests were negative..  MENTAL STATUS EXAM:  Fully alert female, cooperative, blunt affect. Speech is normal and  nonpressured.  Gives a coherent history.  Mood is mildly depressed, some guilt and regret about the relapse but she is looking forward today,  formulating a plan for getting her life stabilized and getting back into a drug treatment program.  Denies any suicidal thoughts today.  Cognitively she is completely intact.  AXIS I:  Mood disorder NOS.  Alcohol dependence.  Opiate dependence. AXIS II:  Deferred. AXIS III:   Headache NOS, rule out opiate withdrawal headache, skin infection NOS. AXIS IV:  Severe social issues. AXIS V:  Current 40,  past year not known.  PLAN:  Plan is to voluntarily admit her with goal of alleviating her suicidal thoughts and giving her a safe detox from opiates and alcohol. We started her on a Librium protocol,  a clonidine protocol for her withdrawal symptoms.  We are going to give her 30 mg of Toradol IM x one today for the headache with 25 mg of Phenergan followed by oral Fioricet as needed for the headache and she has been started on Tegretol 100 mg p.o. b.i.d. for mood stability and we will  consider adding Prozac.     Margaret A. Lorin Picket, N.P.   ______________________________ Orson Aloe, MD    MAS/MEDQ  D:  11/30/2010  T:  11/30/2010  Job:  [id number]  Electronically Signed by Kari Baars N.P. on 12/03/2010 08:43:37 AM Electronically Signed by Orson Aloe  on 12/03/2010 11:40:59 AM

## 2011-08-09 ENCOUNTER — Emergency Department (HOSPITAL_COMMUNITY)
Admission: EM | Admit: 2011-08-09 | Discharge: 2011-08-10 | Disposition: A | Discharge status: Other Acute Inpatient Hospital | Payer: Self-pay | Attending: Emergency Medicine | Admitting: Emergency Medicine

## 2011-08-09 ENCOUNTER — Encounter (HOSPITAL_COMMUNITY): Payer: Self-pay

## 2011-08-09 DIAGNOSIS — F1010 Alcohol abuse, uncomplicated: Secondary | ICD-10-CM | POA: Insufficient documentation

## 2011-08-09 DIAGNOSIS — F17200 Nicotine dependence, unspecified, uncomplicated: Secondary | ICD-10-CM | POA: Insufficient documentation

## 2011-08-09 DIAGNOSIS — F3289 Other specified depressive episodes: Secondary | ICD-10-CM | POA: Insufficient documentation

## 2011-08-09 DIAGNOSIS — F1110 Opioid abuse, uncomplicated: Secondary | ICD-10-CM | POA: Insufficient documentation

## 2011-08-09 DIAGNOSIS — F329 Major depressive disorder, single episode, unspecified: Secondary | ICD-10-CM

## 2011-08-09 DIAGNOSIS — F1910 Other psychoactive substance abuse, uncomplicated: Secondary | ICD-10-CM

## 2011-08-09 LAB — RAPID URINE DRUG SCREEN, HOSP PERFORMED
Amphetamines: NOT DETECTED
Benzodiazepines: NOT DETECTED
Cocaine: POSITIVE — AB
Opiates: NOT DETECTED

## 2011-08-09 LAB — URINALYSIS, ROUTINE W REFLEX MICROSCOPIC
Bilirubin Urine: NEGATIVE
Glucose, UA: NEGATIVE mg/dL
Hgb urine dipstick: NEGATIVE
Protein, ur: NEGATIVE mg/dL

## 2011-08-09 LAB — COMPREHENSIVE METABOLIC PANEL
Albumin: 3.6 g/dL (ref 3.5–5.2)
Alkaline Phosphatase: 99 U/L (ref 39–117)
BUN: 9 mg/dL (ref 6–23)
Calcium: 8.5 mg/dL (ref 8.4–10.5)
Creatinine, Ser: 0.75 mg/dL (ref 0.50–1.10)
GFR calc Af Amer: >90 mL/min (ref >90)
Glucose, Bld: 131 mg/dL — ABNORMAL HIGH (ref 70–99)
Total Protein: 7.1 g/dL (ref 6.0–8.3)

## 2011-08-09 LAB — CBC
HCT: 42.3 % (ref 36.0–46.0)
Hemoglobin: 14.8 g/dL (ref 12.0–15.0)
MCH: 30.6 pg (ref 26.0–34.0)
MCHC: 35 g/dL (ref 30.0–36.0)
RDW: 12.5 % (ref 11.5–15.5)

## 2011-08-09 LAB — ETHANOL: Alcohol, Ethyl (B): 256 mg/dL — ABNORMAL HIGH (ref 0–11)

## 2011-08-09 MED ORDER — LORAZEPAM 1 MG PO TABS
1.0000 mg | ORAL_TABLET | Freq: Three times a day (TID) | ORAL | Status: DC | PRN
  Administered 2011-08-10: 1 mg via ORAL
  Filled 2011-08-09: qty 1

## 2011-08-09 NOTE — ED Notes (Signed)
Patient has 2 bags at nurse's station. Patient has changed into blue scrubs and wanded by security.

## 2011-08-09 NOTE — ED Notes (Signed)
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 Patient states she last used methadone 2 days ago and drank 4 40's beer today. Patient states she last had detox 3 years ago.

## 2011-08-09 NOTE — ED Provider Notes (Signed)
History     CSN: [id number]  Arrival date & time 08/09/11  1726   First MD Initiated Contact with Patient 08/09/11 1907      Chief Complaint  Patient presents with  . Medical Clearance    requesting detox from methadone and alcohol    (Consider location/radiation/quality/duration/timing/severity/associated sxs/prior treatment) The history is provided by the patient.  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 States initially got on pain meds after knee injury. Has been in methadone program via [facility]. Pt denies other narcotic abuse or bzd abuse. States today drank total of 4 40 ounce beers. States drinks most days, and when stops feels shaky. Denies hx seizures or dts. Does note feelings of depression, denies thoughts of harm to self or SI. Denies any recent other health problems or other symptoms. No nvd. No abd pain. No fall, pain or injury. No fever or chills.  History reviewed. No pertinent past medical history.  Past Surgical History  Procedure Date  . Knee surgery     History reviewed. No pertinent family history.  History  Substance Use Topics  . Smoking status: Current Everyday Smoker    Types: Cigarettes  . Smokeless tobacco: Not on file  . Alcohol Use: Yes     4 40's beer daily    OB History    Grav Para Term Preterm Abortions TAB SAB Ect Mult Living                  Review of Systems  Constitutional: Negative for fever and chills.  HENT: Negative for neck pain.   Eyes: Negative for pain.  Respiratory: Negative for shortness of breath.   Cardiovascular: Negative for chest pain.  Gastrointestinal: Negative for abdominal pain.  Genitourinary: Negative for flank pain.  Musculoskeletal: Negative for back pain.  Skin: Negative for rash.  Neurological: Negative for headaches.  Hematological: Does not bruise/bleed easily.  Psychiatric/Behavioral: Negative for confusion.    Allergies  Penicillins  Home Medications  No  current outpatient prescriptions on file.  BP 127/87  Pulse 104  Temp(Src) 98.5 F (36.9 C) (Oral)  Resp 22  SpO2 96%  LMP 08/07/2011  Physical Exam  Nursing note and vitals reviewed. Constitutional: She is oriented to person, place, and time. She appears well-developed and well-nourished. No distress.  HENT:  Head: Atraumatic.  Eyes: Pupils are equal, round, and reactive to light. No scleral icterus.  Neck: Neck supple. No tracheal deviation present.  Cardiovascular: Normal rate, regular rhythm, normal heart sounds and intact distal pulses.   Pulmonary/Chest: Effort normal and breath sounds normal. No respiratory distress.  Abdominal: Soft. Normal appearance and bowel sounds are normal. She exhibits no distension. There is no tenderness.  Musculoskeletal: She exhibits no edema.  Neurological: She is alert and oriented to person, place, and time.       Motor intact bil. Steady gait.   Skin: Skin is warm and dry. No rash noted.  Psychiatric:       Flat affect, depressed mood.     ED Course  Procedures (including critical care time)  Labs Reviewed  COMPREHENSIVE METABOLIC PANEL - Abnormal; Notable for the following:    Glucose, Bld 131 (*)    Total Bilirubin 0.1 (*)    All other components within normal limits  ETHANOL - Abnormal; Notable for the following:    Alcohol, Ethyl (B) 256 (*)    All other components within normal limits  CBC  URINE RAPID DRUG SCREEN (  HOSP PERFORMED)  URINALYSIS, ROUTINE W REFLEX MICROSCOPIC  URINE RAPID DRUG SCREEN (HOSP PERFORMED)   Results for orders placed during the hospital encounter of 08/09/11  CBC      Component Value Range   WBC 8.9  4.0 - 10.5 (K/uL)   RBC 4.83  3.87 - 5.11 (MIL/uL)   Hemoglobin 14.8  12.0 - 15.0 (g/dL)   HCT 16.1  09.6 - 04.5 (%)   MCV 87.6  78.0 - 100.0 (fL)   MCH 30.6  26.0 - 34.0 (pg)   MCHC 35.0  30.0 - 36.0 (g/dL)   RDW 40.9  81.1 - 91.4 (%)   Platelets 324  150 - 400 (K/uL)  COMPREHENSIVE METABOLIC  PANEL      Component Value Range   Sodium 136  135 - 145 (mEq/L)   Potassium 3.9  3.5 - 5.1 (mEq/L)   Chloride 101  96 - 112 (mEq/L)   CO2 25  19 - 32 (mEq/L)   Glucose, Bld 131 (*) 70 - 99 (mg/dL)   BUN 9  6 - 23 (mg/dL)   Creatinine, Ser 7.82  0.50 - 1.10 (mg/dL)   Calcium 8.5  8.4 - 95.6 (mg/dL)   Total Protein 7.1  6.0 - 8.3 (g/dL)   Albumin 3.6  3.5 - 5.2 (g/dL)   AST 22  0 - 37 (U/L)   ALT 18  0 - 35 (U/L)   Alkaline Phosphatase 99  39 - 117 (U/L)   Total Bilirubin 0.1 (*) 0.3 - 1.2 (mg/dL)   GFR calc non Af Amer >90  >90 (mL/min)   GFR calc Af Amer >90  >90 (mL/min)  URINE RAPID DRUG SCREEN (HOSP PERFORMED)      Component Value Range   Opiates NONE DETECTED  NONE DETECTED    Cocaine POSITIVE (*) NONE DETECTED    Benzodiazepines NONE DETECTED  NONE DETECTED    Amphetamines NONE DETECTED  NONE DETECTED    Tetrahydrocannabinol NONE DETECTED  NONE DETECTED    Barbiturates NONE DETECTED  NONE DETECTED   URINALYSIS, ROUTINE W REFLEX MICROSCOPIC      Component Value Range   Color, Urine YELLOW  YELLOW    APPearance CLOUDY (*) CLEAR    Specific Gravity, Urine 1.011  1.005 - 1.030    pH 6.0  5.0 - 8.0    Glucose, UA NEGATIVE  NEGATIVE (mg/dL)   Hgb urine dipstick NEGATIVE  NEGATIVE    Bilirubin Urine NEGATIVE  NEGATIVE    Ketones, ur NEGATIVE  NEGATIVE (mg/dL)   Protein, ur NEGATIVE  NEGATIVE (mg/dL)   Urobilinogen, UA 0.2  0.0 - 1.0 (mg/dL)   Nitrite NEGATIVE  NEGATIVE    Leukocytes, UA NEGATIVE  NEGATIVE   ETHANOL      Component Value Range   Alcohol, Ethyl (B) 256 (*) 0 - 11 (mg/dL)       MDM  Labs.   Reviewed prior charts and nursing notes for additional hx.    Recheck awake and alert, no tremor or shakes.    Discussed w act team, their eval is pending.  Will sign out to oncoming edp to f/u with act/psych eval, recheck pt, and dispo appropriately.        Suzi Roots, MD 08/09/11 2010

## 2011-08-10 ENCOUNTER — Inpatient Hospital Stay (HOSPITAL_COMMUNITY)
Admission: EM | Admit: 2011-08-10 | Discharge: 2011-08-20 | DRG: 897 | Disposition: A | Discharge status: Home / Self Care | Payer: No Typology Code available for payment source | Source: Ambulatory Visit | Attending: Psychiatry | Admitting: Psychiatry

## 2011-08-10 DIAGNOSIS — F1994 Other psychoactive substance use, unspecified with psychoactive substance-induced mood disorder: Secondary | ICD-10-CM | POA: Diagnosis present

## 2011-08-10 DIAGNOSIS — F19939 Other psychoactive substance use, unspecified with withdrawal, unspecified: Principal | ICD-10-CM | POA: Diagnosis present

## 2011-08-10 DIAGNOSIS — F10939 Alcohol use, unspecified with withdrawal, unspecified: Secondary | ICD-10-CM | POA: Diagnosis present

## 2011-08-10 DIAGNOSIS — F1120 Opioid dependence, uncomplicated: Secondary | ICD-10-CM | POA: Diagnosis present

## 2011-08-10 DIAGNOSIS — F10239 Alcohol dependence with withdrawal, unspecified: Secondary | ICD-10-CM | POA: Diagnosis present

## 2011-08-10 DIAGNOSIS — F1010 Alcohol abuse, uncomplicated: Secondary | ICD-10-CM | POA: Diagnosis present

## 2011-08-10 DIAGNOSIS — F17200 Nicotine dependence, unspecified, uncomplicated: Secondary | ICD-10-CM | POA: Diagnosis present

## 2011-08-10 DIAGNOSIS — F1920 Other psychoactive substance dependence, uncomplicated: Secondary | ICD-10-CM | POA: Diagnosis present

## 2011-08-10 MED ORDER — DICYCLOMINE HCL 20 MG PO TABS
20.0000 mg | ORAL_TABLET | Freq: Four times a day (QID) | ORAL | Status: DC | PRN
  Administered 2011-08-10: 20 mg via ORAL
  Filled 2011-08-10: qty 1

## 2011-08-10 MED ORDER — ONDANSETRON 4 MG PO TBDP: 4.0000 mg | ORAL_TABLET | Freq: Four times a day (QID) | ORAL | Status: DC | PRN

## 2011-08-10 MED ORDER — CHLORDIAZEPOXIDE HCL 25 MG PO CAPS: 25.0000 mg | ORAL_CAPSULE | Freq: Every day | ORAL | Status: DC

## 2011-08-10 MED ORDER — CLONIDINE HCL 0.1 MG PO TABS: 0.1000 mg | ORAL_TABLET | ORAL | Status: DC

## 2011-08-10 MED ORDER — ADULT MULTIVITAMIN W/MINERALS CH
1.0000 | ORAL_TABLET | Freq: Every day | ORAL | Status: DC
  Administered 2011-08-11 – 2011-08-20 (×10): 1 via ORAL
  Filled 2011-08-10 (×12): qty 1

## 2011-08-10 MED ORDER — CLONIDINE HCL 0.1 MG PO TABS: 0.1000 mg | ORAL_TABLET | Freq: Every day | ORAL | Status: DC

## 2011-08-10 MED ORDER — CHLORDIAZEPOXIDE HCL 25 MG PO CAPS: 25.0000 mg | ORAL_CAPSULE | ORAL | Status: DC

## 2011-08-10 MED ORDER — VITAMIN B-1 100 MG PO TABS
100.0000 mg | ORAL_TABLET | Freq: Every day | ORAL | Status: DC
  Administered 2011-08-11 – 2011-08-20 (×10): 100 mg via ORAL
  Filled 2011-08-10 (×12): qty 1

## 2011-08-10 MED ORDER — LOPERAMIDE HCL 2 MG PO CAPS
2.0000 mg | ORAL_CAPSULE | ORAL | Status: AC | PRN
  Administered 2011-08-13: 4 mg via ORAL
  Administered 2011-08-13: 2 mg via ORAL

## 2011-08-10 MED ORDER — NAPROXEN 500 MG PO TABS
500.0000 mg | ORAL_TABLET | Freq: Two times a day (BID) | ORAL | Status: DC | PRN
  Administered 2011-08-10: 500 mg via ORAL
  Filled 2011-08-10: qty 1

## 2011-08-10 MED ORDER — CHLORDIAZEPOXIDE HCL 25 MG PO CAPS
25.0000 mg | ORAL_CAPSULE | Freq: Four times a day (QID) | ORAL | Status: AC
  Administered 2011-08-10 – 2011-08-12 (×6): 25 mg via ORAL
  Filled 2011-08-10 (×6): qty 1

## 2011-08-10 MED ORDER — THIAMINE HCL 100 MG/ML IJ SOLN
100.0000 mg | Freq: Once | INTRAMUSCULAR | Status: AC
  Administered 2011-08-19: 100 mg via INTRAMUSCULAR

## 2011-08-10 MED ORDER — ONDANSETRON HCL 4 MG PO TABS
4.0000 mg | ORAL_TABLET | Freq: Three times a day (TID) | ORAL | Status: DC | PRN
  Administered 2011-08-10: 4 mg via ORAL
  Filled 2011-08-10: qty 1

## 2011-08-10 MED ORDER — CHLORDIAZEPOXIDE HCL 25 MG PO CAPS
25.0000 mg | ORAL_CAPSULE | Freq: Four times a day (QID) | ORAL | Status: AC | PRN
  Administered 2011-08-10 – 2011-08-13 (×9): 25 mg via ORAL
  Filled 2011-08-10 (×9): qty 1

## 2011-08-10 MED ORDER — LOPERAMIDE HCL 2 MG PO CAPS: 2.0000 mg | ORAL_CAPSULE | ORAL | Status: DC | PRN

## 2011-08-10 MED ORDER — CLONIDINE HCL 0.1 MG PO TABS
0.1000 mg | ORAL_TABLET | Freq: Four times a day (QID) | ORAL | Status: DC
  Administered 2011-08-10 (×3): 0.1 mg via ORAL
  Filled 2011-08-10 (×3): qty 1

## 2011-08-10 MED ORDER — HYDROXYZINE HCL 25 MG PO TABS
25.0000 mg | ORAL_TABLET | Freq: Four times a day (QID) | ORAL | Status: AC | PRN
  Administered 2011-08-10 – 2011-08-13 (×4): 25 mg via ORAL

## 2011-08-10 MED ORDER — CHLORDIAZEPOXIDE HCL 25 MG PO CAPS
25.0000 mg | ORAL_CAPSULE | Freq: Three times a day (TID) | ORAL | Status: DC
  Administered 2011-08-12: 25 mg via ORAL
  Filled 2011-08-10: qty 1

## 2011-08-10 MED ORDER — MAGNESIUM HYDROXIDE 400 MG/5ML PO SUSP: 30.0000 mL | Freq: Every day | ORAL | Status: DC | PRN

## 2011-08-10 MED ORDER — ONDANSETRON 4 MG PO TBDP
4.0000 mg | ORAL_TABLET | Freq: Four times a day (QID) | ORAL | Status: AC | PRN
  Administered 2011-08-11 – 2011-08-12 (×3): 4 mg via ORAL
  Filled 2011-08-10 (×2): qty 1

## 2011-08-10 MED ORDER — ALUM & MAG HYDROXIDE-SIMETH 200-200-20 MG/5ML PO SUSP: 30.0000 mL | ORAL | Status: DC | PRN

## 2011-08-10 MED ORDER — ACETAMINOPHEN 325 MG PO TABS
650.0000 mg | ORAL_TABLET | Freq: Four times a day (QID) | ORAL | Status: DC | PRN
  Administered 2011-08-11: 650 mg via ORAL

## 2011-08-10 MED ORDER — HYDROXYZINE HCL 25 MG PO TABS
25.0000 mg | ORAL_TABLET | Freq: Four times a day (QID) | ORAL | Status: DC | PRN
  Administered 2011-08-10: 25 mg via ORAL
  Filled 2011-08-10: qty 1

## 2011-08-10 MED ORDER — METHOCARBAMOL 500 MG PO TABS
500.0000 mg | ORAL_TABLET | Freq: Three times a day (TID) | ORAL | Status: DC | PRN
  Filled 2011-08-10: qty 1

## 2011-08-10 NOTE — ED Notes (Signed)
Pt has been accepted to [facility], but can not transport until after shift change-per bobby ACT

## 2011-08-10 NOTE — BH Assessment (Addendum)
Per 7 pm shift report, pt has been accepted at [facility] by Verne Spurr PA to Dr Koren Shiver. Bed 307-2. Support paperwork signed and faxed and originals put in pt's chart. RN notified. Pt will go to [facility] after 1930 shift change. EDP notified.

## 2011-08-10 NOTE — ED Notes (Signed)
Up to the bathroom-vomiting
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
## 2011-08-10 NOTE — ED Notes (Signed)
Report given to psych ed. Pt to be moved to room 40 and pink duffle bag and small pt belongings bag will be in activity room.

## 2011-08-10 NOTE — ED Notes (Signed)
Up to the desk on the phone 

## 2011-08-10 NOTE — Tx Team (Signed)
Initial Interdisciplinary Treatment Plan  PATIENT STRENGTHS: (choose at least two) Ability for insight Average or above average intelligence Capable of independent living Motivation for treatment/growth Supportive family/friends Work skills  PATIENT STRESSORS: Financial difficulties Substance abuse   PROBLEM LIST: Problem List/Patient Goals Date to be addressed Date deferred Reason deferred Estimated date of resolution  Substance abuse 08/10/11     Depression 08/10/11                                                DISCHARGE CRITERIA:  Motivation to continue treatment in a less acute level of care Need for constant or close observation no longer present Withdrawal symptoms are absent or subacute and managed without 24-hour nursing intervention  PRELIMINARY DISCHARGE PLAN: Attend 12-step recovery group Return to previous living arrangement Return to previous work or school arrangements  PATIENT/FAMIILY INVOLVEMENT: This treatment plan has been presented to and reviewed with the patient, [employer].  The patient and family have been given the opportunity to ask questions and make suggestions.  Juliann Pares 08/10/2011, 9:58 PM

## 2011-08-10 NOTE — ED Provider Notes (Signed)
Depression and requesting detox from etoh and opiates.  Medically clear for psychiatric evaluation.  On assessment this am requested medication for nausea otherwise had no additional complaints.    RRR Lungs CTAB Abd soft, NT, ND  Labs reviewed and temp holding orders are placed.  Dayton Bailiff, MD 08/10/11 [phone]
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
## 2011-08-11 DIAGNOSIS — F1920 Other psychoactive substance dependence, uncomplicated: Secondary | ICD-10-CM | POA: Diagnosis present

## 2011-08-11 MED ORDER — METHOCARBAMOL 500 MG PO TABS
500.0000 mg | ORAL_TABLET | Freq: Three times a day (TID) | ORAL | Status: DC | PRN
  Administered 2011-08-11 – 2011-08-12 (×3): 500 mg via ORAL
  Filled 2011-08-11 (×3): qty 1

## 2011-08-11 MED ORDER — DICYCLOMINE HCL 20 MG PO TABS
20.0000 mg | ORAL_TABLET | Freq: Four times a day (QID) | ORAL | Status: AC | PRN
  Administered 2011-08-11 – 2011-08-15 (×10): 20 mg via ORAL
  Filled 2011-08-11 (×11): qty 1

## 2011-08-11 MED ORDER — CLONIDINE HCL 0.1 MG PO TABS
0.1000 mg | ORAL_TABLET | Freq: Every day | ORAL | Status: DC
  Filled 2011-08-11 (×2): qty 1

## 2011-08-11 MED ORDER — CLONIDINE HCL 0.1 MG PO TABS
0.1000 mg | ORAL_TABLET | Freq: Four times a day (QID) | ORAL | Status: AC
  Administered 2011-08-11 – 2011-08-12 (×8): 0.1 mg via ORAL
  Filled 2011-08-11 (×8): qty 1

## 2011-08-11 MED ORDER — NAPROXEN 500 MG PO TABS
500.0000 mg | ORAL_TABLET | Freq: Two times a day (BID) | ORAL | Status: DC | PRN
  Administered 2011-08-11 – 2011-08-12 (×3): 500 mg via ORAL
  Filled 2011-08-11 (×3): qty 1

## 2011-08-11 MED ORDER — CARBAMAZEPINE ER 200 MG PO TB12
200.0000 mg | ORAL_TABLET | Freq: Two times a day (BID) | ORAL | Status: DC
  Administered 2011-08-11 – 2011-08-12 (×2): 200 mg via ORAL
  Filled 2011-08-11 (×6): qty 1

## 2011-08-11 MED ORDER — CLONIDINE HCL 0.1 MG PO TABS
0.1000 mg | ORAL_TABLET | ORAL | Status: DC
  Administered 2011-08-13 – 2011-08-14 (×3): 0.1 mg via ORAL
  Filled 2011-08-11 (×5): qty 1

## 2011-08-11 MED ORDER — TRAZODONE HCL 100 MG PO TABS
100.0000 mg | ORAL_TABLET | Freq: Every evening | ORAL | Status: DC | PRN
  Administered 2011-08-11 – 2011-08-12 (×2): 100 mg via ORAL
  Filled 2011-08-11 (×6): qty 1
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
## 2011-08-11 NOTE — Progress Notes (Signed)
Patient ID: Megan Edwards, female   DOB: [birth year], 36 y.o.   MRN: [id number]  Pt has been very flat and depressed on the unit, pt has also reported severe withdrawal symptoms. Pt was given Bentyl and Robaxin for her withdrawal symptoms. Pt reported being negative SI/HI, no AH/VH noted.

## 2011-08-11 NOTE — Progress Notes (Signed)
Patient ID: Megan Edwards, female   DOB: 1975-11-28, 36 y.o.   MRN: [id number]  Pt. did not attend aftercare planning group.

## 2011-08-11 NOTE — Progress Notes (Signed)
Patient ID: Megan Edwards, female   DOB: March 12, 1976, 36 y.o.   MRN: [id number]  [facility] Group Notes:  (Counselor/Nursing/MHT/Case Management/Adjunct)  08/11/2011 1:15 PM  Type of Therapy:  Group Therapy, Dance/Movement Therapy   Participation Level:  Did Not Attend  Rhunette Croft
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
## 2011-08-11 NOTE — Progress Notes (Signed)
[facility] Adult Inpatient Family/Significant Other Suicide Prevention Education  Suicide Prevention Education:  Patient Refusal for Family/Significant Other Suicide Prevention Education: The patient Megan Edwards has refused to provide written consent for family/significant other to be provided Family/Significant Other Suicide Prevention Education during admission and/or prior to discharge.  Physician notified.  Pt. accepted information on suicide prevention, warning signs to look for with suicide and crisis line numbers to use. The pt. agreed to call crisis line numbers if having warning signs or having thoughts of suicide.   Pt stated she has "noone to call".   [facility] 08/11/2011, 11:26 AM

## 2011-08-12 DIAGNOSIS — F39 Unspecified mood [affective] disorder: Secondary | ICD-10-CM

## 2011-08-12 DIAGNOSIS — F1420 Cocaine dependence, uncomplicated: Secondary | ICD-10-CM

## 2011-08-12 DIAGNOSIS — F1020 Alcohol dependence, uncomplicated: Secondary | ICD-10-CM

## 2011-08-12 DIAGNOSIS — F1120 Opioid dependence, uncomplicated: Secondary | ICD-10-CM

## 2011-08-12 MED ORDER — ASPIRIN-ACETAMINOPHEN-CAFFEINE 250-250-65 MG PO TABS
2.0000 | ORAL_TABLET | Freq: Four times a day (QID) | ORAL | Status: DC | PRN
  Administered 2011-08-12 – 2011-08-19 (×8): 2 via ORAL
  Filled 2011-08-12 (×2): qty 2

## 2011-08-12 MED ORDER — ASPIRIN-ACETAMINOPHEN-CAFFEINE 250-250-65 MG PO TABS: 1.0000 | ORAL_TABLET | Freq: Four times a day (QID) | ORAL | Status: DC | PRN

## 2011-08-12 MED ORDER — CHLORDIAZEPOXIDE HCL 25 MG PO CAPS
50.0000 mg | ORAL_CAPSULE | Freq: Three times a day (TID) | ORAL | Status: AC
  Administered 2011-08-12: 25 mg via ORAL
  Filled 2011-08-12: qty 2

## 2011-08-12 MED ORDER — METHOCARBAMOL 500 MG PO TABS
500.0000 mg | ORAL_TABLET | Freq: Four times a day (QID) | ORAL | Status: DC | PRN
  Administered 2011-08-12 – 2011-08-15 (×10): 500 mg via ORAL
  Filled 2011-08-12 (×10): qty 1

## 2011-08-12 MED ORDER — CARBAMAZEPINE 200 MG PO TABS
200.0000 mg | ORAL_TABLET | Freq: Three times a day (TID) | ORAL | Status: DC
  Administered 2011-08-12 – 2011-08-20 (×25): 200 mg via ORAL
  Filled 2011-08-12 (×7): qty 1
  Filled 2011-08-12 (×2): qty 21
  Filled 2011-08-12 (×10): qty 1
  Filled 2011-08-12: qty 21
  Filled 2011-08-12 (×10): qty 1

## 2011-08-12 MED ORDER — CHLORDIAZEPOXIDE HCL 25 MG PO CAPS
50.0000 mg | ORAL_CAPSULE | ORAL | Status: AC
  Administered 2011-08-13 (×2): 50 mg via ORAL
  Filled 2011-08-12: qty 2
  Filled 2011-08-12 (×2): qty 1

## 2011-08-12 MED ORDER — TRAZODONE HCL 100 MG PO TABS
200.0000 mg | ORAL_TABLET | Freq: Every evening | ORAL | Status: DC | PRN
  Administered 2011-08-12 – 2011-08-19 (×8): 200 mg via ORAL
  Filled 2011-08-12 (×12): qty 2
  Filled 2011-08-12: qty 1
  Filled 2011-08-12 (×4): qty 2
  Filled 2011-08-12: qty 1
  Filled 2011-08-12 (×4): qty 2

## 2011-08-12 MED ORDER — CHLORDIAZEPOXIDE HCL 25 MG PO CAPS
50.0000 mg | ORAL_CAPSULE | Freq: Every day | ORAL | Status: AC
  Administered 2011-08-14: 50 mg via ORAL
  Filled 2011-08-12: qty 2

## 2011-08-12 NOTE — Progress Notes (Signed)
Patient ID: Megan Edwards, female   DOB: April 03, 1975, 36 y.o.   MRN: [id number] Spent most of the evening in bed, stated she was achey all over, her back and knees especially, and was given heat packs which seemed to help.  Decided not to come out for group, but did come to med window afterward for her hs meds.  Has been pleasant and cooperative otherwise, planning to try to go to groups tomorrow.  Contracts for safety, denies SI/HI.  Will continue to monitor.
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
## 2011-08-12 NOTE — Progress Notes (Addendum)
[facility] Group Notes:  (Counselor/Nursing/MHT/Case Management/Adjunct)  08/12/2011 1:04 PM  Type of Therapy:  Group Therapy at 11AM  Participation Level:  Active  Participation Quality:  Redirectable and Sharing  Affect:  Angry and Irritable  Cognitive:  Oriented  Insight:  Limited  Engagement in Group:  Good  Engagement in Therapy:  Limited  Modes of Intervention:  Clarification, Limit-setting and Support  Summary of Progress/Problems:  Gracelin shared frustrations of finding herself in detox again.  "I'm at the mercy of what my mother and my job says I have to do and of course she has contacted them." "You folks have to talk to her and she's saying I need long term treatment; she wants me to go for two years."  Others in group offered reality that her job will not be held for two years.  "I don't want to sit through all this again, I could probably teach the damn rehab classes, I won't learn anything new."  Later patient was asked if she felt ready to make changes and she stated "I have to be."  Pt also shared during group that treatment center ([facility] and other unnamed) had put her on methadone once and suboxone another time.   Clide Dales 08/12/2011, 1:04 PM

## 2011-08-12 NOTE — Progress Notes (Signed)
Patient ID: Megan Edwards, female   DOB: 20-Feb-1976, 36 y.o.   MRN: [id number] She has been up and about and to groups interacting with peers and staff. Has requested and received PRN medications throughout the day. Has had little affect for her w/d symptoms.

## 2011-08-12 NOTE — Progress Notes (Signed)
Patient ID: Megan Edwards, female   DOB: 1975/04/30, 36 y.o.   MRN: [id number] Pt did not attend AA group, due to cramping. "I've been in bed most of the day because my stomach has been cramping". Stated she was hoping to have her methodone  decreased gradually, but was informed that "we aren't able to do that anymore".  Pt plans to attend long term treatment after discharge to get off "everything".
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
## 2011-08-13 MED ORDER — GABAPENTIN 100 MG PO CAPS
100.0000 mg | ORAL_CAPSULE | Freq: Three times a day (TID) | ORAL | Status: DC
  Administered 2011-08-13 – 2011-08-16 (×9): 100 mg via ORAL
  Filled 2011-08-13 (×13): qty 1

## 2011-08-13 MED ORDER — LIDOCAINE 5 % EX PTCH
1.0000 | MEDICATED_PATCH | Freq: Once | CUTANEOUS | Status: AC
  Administered 2011-08-13: 1 via TRANSDERMAL
  Filled 2011-08-13 (×2): qty 1

## 2011-08-13 MED ORDER — LIDOCAINE 5 % EX PTCH
1.0000 | MEDICATED_PATCH | CUTANEOUS | Status: DC
  Administered 2011-08-14 – 2011-08-20 (×7): 1 via TRANSDERMAL
  Filled 2011-08-13 (×9): qty 1

## 2011-08-13 NOTE — Progress Notes (Signed)
Patient ID: Megan Edwards, female   DOB: 06/15/1975, 36 y.o.   MRN: [id number]  Pt was more pleasant and responsive today. Stated that she still experiencing cramps, and diffused pain.  "I started to leave this place today and go to the clinic." "Why would you do that". "I don't no man, I'm tired of feeling like this". "You've made a decision to come, your mind is made up, so now is the time to do it".

## 2011-08-13 NOTE — Tx Team (Signed)
Interdisciplinary Treatment Plan Update (Adult)  Date:  08/13/2011  Time Reviewed:  9:57 AM   Progress in Treatment: Attending groups: Yes Participating in groups:  Yes Taking medication as prescribed: Yes Tolerating medication:  Yes Family/Significant othe contact made:  Yes Patient understands diagnosis:  Yes Discussing patient identified problems/goals with staff:  Yes Medical problems stabilized or resolved:  Yes Denies suicidal/homicidal ideation: Yes Issues/concerns per patient self-inventory:  None identified Other: N/A  New problem(s) identified: None Identified  Reason for Continuation of Hospitalization: Anxiety Depression Medication stabilization Withdrawal symptoms  Interventions implemented related to continuation of hospitalization: mood stabilization, medication monitoring and adjustment, group therapy and psycho education, safety checks q 15 mins  Additional comments: N/A  Estimated length of stay: 3-5 days  Discharge Plan: Pt will follow up at [facility] for further treatment.    New goal(s): N/A  Review of initial/current patient goals per problem list:    1.  Goal(s): Address substance use  Met:  No  Target date: by discharge  As evidenced by: completing detox protocol and refer to appropriate treatment  2.  Goal (s): Reduce depressive and anxiety symptoms  Met:  No  Target date: by discharge  As evidenced by: Reducing depression from a 10 to a 3 as reported by pt.    3.  Goal(s): Eliminate SI  Met:  No  Target date: by discharge  As evidenced by: pt denying SI.    Attendees: Patient:  Megan Edwards 08/13/2011 9:59 AM   Family:     Physician:  Lupe Carney, DO 08/13/2011 9:57 AM   Nursing: Izola Price, RN 08/13/2011 9:57 AM   Case Manager:  Reyes Ivan, LCSWA 08/13/2011  9:57 AM   Counselor:  Ronda Fairly, LCSWA 08/13/2011  9:57 AM   Other:  Richelle Ito, LCSW 08/13/2011 9:57 AM   Other:  Robbie Louis, RN  08/13/2011 9:59 AM   Other:  Roswell Miners, RN 08/13/2011 9:59 AM   Other:      Scribe for Treatment Team:   Reyes Ivan 08/13/2011 9:57 AM

## 2011-08-13 NOTE — Progress Notes (Signed)
Pt attended discharge planning group and actively participated.  Pt presents with agitated affect and depressed mood.  Pt states that she is having severe withdrawal today and in pain.  Pt states that she is angry and depressed.  Pt ranks depression and anxiety at an 8-9 today.  Pt denies SI.  Pt states that her family and job lie to her by saying she can come back home once she detoxes, but once she gets here they want her to get further treatment.  Pt states that she will do what she needs to do.  Pt asked if she can transfer to [facility] to complete her detox.  SW explained this is not an option.  Pt is scheduled to go to [facility] next Tuesday.  No further needs voiced by pt at this time.    Reyes Ivan, LCSWA 08/13/2011  10:19 AM

## 2011-08-13 NOTE — Progress Notes (Addendum)
[facility] MD Progress Note  08/13/2011 1:37 PM  Current Mental Status: Patient seen and evaluated in team. Chart reviewed. Patient stated that her mood was "not good". Sig w/d s/s noted off methadone. Her affect was mood congruent and anxious. She denied any current thoughts of self injurious behavior, suicidal ideation or homicidal ideation. There were no auditory or visual hallucinations, paranoia, delusional thought processes, or mania noted. Thought process was linear and goal directed. Psychomotor agitation noted. Speech was normal rate, tone and volume. Eye contact was good. Judgment and insight are fair. Patient has been up and engaged on the unit. No acute safety concerns reported from team. C/o pain, difficulty sleeping and sig w/d s/s off opioids.  Sleep:  Number of Hours: 6.25    Vital Signs:Blood pressure 113/73, pulse 76, temperature 98.2 F (36.8 C), temperature source Oral, resp. rate 20, height 5\' 6"  (1.676 m), weight 80.967 kg (178 lb 8 oz), last menstrual period 08/07/2011.  Lab Results: No results found for this or any previous visit (from the past 48 hour(s)).  Physical Findings: CIWA:  CIWA-Ar Total: 6  COWS:  COWS Total Score: 12   CLINICAL FACTORS: Alcohol Dependence & W/D; Cocaine Dependence; Opioid Dependence & W/D; Mood Disorder NOS; BPAD, per Hx; r/o PD NOS with Cluster B Traits   PLAN OF CARE: Pt admitted for crisis stabilization, detox and treatment. Increased several meds for acute w/d s/s including increasing Librium taper to 50mg  protocol; increased Robaxin to q6hrs; Excedrin Migraine for c/o HAs; increased trazodone to 200mg  per pt report of past efficacy at 200-300mg  po qhs; increased Tegretol to 200mg  tid for improved mood stability and all prn meds reviewed with pt. Added lidocaine patch and Neurontin 100mg  po tid (in light of polypharmacy) today per pt request for pain. Please see orders. Medications reviewed with pt and medication education provided. No clinical  indication for one on one level of observation at this time. Pt contracting for safety. Mental health treatment, medication management and continued sobriety will mitigate against the increased risk of harm to self and/or others. Discussed the importance of recovery with pt, as well as, tools to move forward in a healthy & safe manner. Pt agreeable with the plan. Discussed with the team.   Lupe Carney 08/13/2011, 1:37 PM

## 2011-08-13 NOTE — Progress Notes (Signed)
[facility] Group Notes:  (Counselor/Nursing/MHT/Case Management/Adjunct)  08/13/2011 10:52 PM  Type of Therapy:  Group Therapy at 1:15  Participation Level:  Active  Participation Quality:  Attentive and Sharing  Affect:  Flat and not feeling well  Cognitive:  Oriented  Insight:  Good  Engagement in Group:  Good  Engagement in Therapy:  Good  Modes of Intervention:  Clarification, Education and Support  Summary of Progress/Problems:  Megan Edwards came into group late but willingly joined in discussion on feelings about diagnosis.  "I just don't want to go through all this again and feel like it is kind of hopeless.  I mean I'm not sure I can do this sobriety thing; what is the point of trying?"  Patient was open to concept of giving her addiction an 'idenenity' and perhaps what she is saying is really more of her addiction talking than her.  "Yeah, I know I'll make more sens in a few days once I make it through this withdrawal; but then I go for 'little miss recovery person star' - it's always perfection or failure with me." Patient was encouraged to find the middle ground and knowing that what she is doing today and what she does tomorrow will be enough if she is staying away from a drink or a drug.  Others in group spoke about the concept of 'being enough.'   Clide Dales 08/13/2011, 11:00 PM

## 2011-08-14 DIAGNOSIS — F1120 Opioid dependence, uncomplicated: Secondary | ICD-10-CM | POA: Diagnosis present

## 2011-08-14 MED ORDER — CHLORDIAZEPOXIDE HCL 25 MG PO CAPS
25.0000 mg | ORAL_CAPSULE | Freq: Two times a day (BID) | ORAL | Status: DC | PRN
  Administered 2011-08-15: 25 mg via ORAL
  Filled 2011-08-14 (×3): qty 1

## 2011-08-14 MED ORDER — QUETIAPINE FUMARATE 25 MG PO TABS
ORAL_TABLET | ORAL | Status: AC
  Filled 2011-08-14: qty 1

## 2011-08-14 MED ORDER — CLONIDINE HCL 0.1 MG PO TABS
0.1000 mg | ORAL_TABLET | Freq: Two times a day (BID) | ORAL | Status: DC
  Administered 2011-08-17 – 2011-08-18 (×2): 0.1 mg via ORAL
  Filled 2011-08-14 (×6): qty 1

## 2011-08-14 MED ORDER — QUETIAPINE FUMARATE 25 MG PO TABS
25.0000 mg | ORAL_TABLET | Freq: Three times a day (TID) | ORAL | Status: DC
  Administered 2011-08-14 – 2011-08-16 (×5): 25 mg via ORAL
  Filled 2011-08-14 (×8): qty 1

## 2011-08-14 MED ORDER — CHLORDIAZEPOXIDE HCL 25 MG PO CAPS
25.0000 mg | ORAL_CAPSULE | Freq: Two times a day (BID) | ORAL | Status: DC | PRN
  Administered 2011-08-15 – 2011-08-19 (×9): 25 mg via ORAL
  Filled 2011-08-14 (×7): qty 1

## 2011-08-14 MED ORDER — QUETIAPINE FUMARATE 100 MG PO TABS
100.0000 mg | ORAL_TABLET | Freq: Every day | ORAL | Status: DC
  Administered 2011-08-14 – 2011-08-15 (×2): 100 mg via ORAL
  Filled 2011-08-14 (×5): qty 1

## 2011-08-14 MED ORDER — CHLORDIAZEPOXIDE HCL 25 MG PO CAPS
25.0000 mg | ORAL_CAPSULE | Freq: Two times a day (BID) | ORAL | Status: DC
  Administered 2011-08-14 (×2): 25 mg via ORAL
  Filled 2011-08-14 (×2): qty 1

## 2011-08-14 MED ORDER — CLONIDINE HCL 0.1 MG PO TABS
0.1000 mg | ORAL_TABLET | Freq: Three times a day (TID) | ORAL | Status: AC
  Administered 2011-08-14 – 2011-08-17 (×9): 0.1 mg via ORAL
  Filled 2011-08-14 (×10): qty 1

## 2011-08-14 NOTE — Progress Notes (Signed)
Patient ID: Darden Amber, female   DOB: 1976/01/27, 36 y.o.   MRN: [id number] She has been up and went to group this pm. She has requested and received prn medication, see mars,. Has been tearful this PM saying I want to be off this merry-go-round, that she was tired of all the pain.

## 2011-08-14 NOTE — Progress Notes (Signed)
Patient ID: Megan Edwards, female   DOB: 23-Jun-1975, 36 y.o.   MRN: [id number] Patient with history of methadone maintenance, last took dose of methadone 100 mg on or about 18th of May. She is currently on the clonidine detox protocol and is noted to be tachycardic today with a pulse of 111, blood pressure 139/76. She is up and about in the milieu, well dressed with makeup on.  She continues to complain of significant withdrawal symptoms including significant myalgias,anxiety, and mild agitated thoughts. Slept from 11 PM to 3 AM this morning but was unable to get back to sleep. Sleep is restless and of poor quality.  She is having difficulty settling down and concentrating in group due to her symptoms.  Reports drug cravings are getting significantly worse as last couple days go by.  Asking for help with symptoms and cravings.  Has done well on Seroquel 100mg  q HS in past.   Mental status exam: Fully alert female, in full contact with reality, with appropriate hygiene and dress. Eye contact is good. Affect is anxious and mood is congruent rates her anxiety and 9/10 on a 1-10 scale if 10 is the worst symptoms. Rates depression 3-4/10, hopelessness 5/10. She denies suicidal thoughts but admits that she "feels miserable all over". Insight adequate. Impulse control and judgment are within normal limits. Cognition is preserved. No signs of delirium or confusion.  Plan: We'll extend clonidine detox protocol, clonidine 0.1 mg 3 times a day for 3 days, followed by clonidine 0.1 mg twice a day x3 days. Add Seroquel 100 mg each bedtime. Add Seroquel 25 mg 3 times a day, first dose now.  Continue trazodone at at bedtime. Add Librium 25 mg BID prn anxiety.

## 2011-08-14 NOTE — Progress Notes (Signed)
Pt attended discharge planning group and actively participated.  Pt presents with anxious mood and affect.  Pt reports no depression but high anxiety due to dealing with withdrawal.  Pt denies SI.  Pt states that she had poor sleep last night.  Pt states that she is thinking about changing her d/c plan but is uncertain.  Pt states that she could live with her sponsor.  Pt reports going to [facility] 4 times and [facility] in the past.  Pt requested to keep the appointment for [facility] though because she may change her mind again.  No further needs voiced by pt at this time.  Safety planning and suicide prevention discussed.     Reyes Ivan, LCSWA 08/14/2011  9:44 AM

## 2011-08-14 NOTE — Progress Notes (Signed)
Patient ID: Megan Edwards, female   DOB: 21-May-1975, 36 y.o.   MRN: [id number] She has been anxious about her withdrawal symptoms of chilling, diarrhea(has not requested diarrhea  Prn medication)  Tremors and cravings. Spoke of being on methadone and wanting off of it , then said that  It had helped her craving.

## 2011-08-14 NOTE — Progress Notes (Signed)
[facility] Group Notes:  (Counselor/Nursing/MHT/Case Management/Adjunct)  08/14/2011 1:02 PM  Type of Therapy:  Group Therapy at 11:00  Participation Level:  Active  Participation Quality:  Sharing  Affect:  Anxious and Irritable with chills and other withdrawal symptoms  Cognitive:  Oriented  Insight:  Limited  Engagement in Group:  Good  Engagement in Therapy:  Limited  Modes of Intervention:  Clarification, Limit-setting and Support  Summary of Progress/Problems:  Megan Edwards was visibly having withdrawal symptoms but came to group for entire session.  "Is it wrong of me to say I am jealous of other people's withdrawal or what appears to be lack of withdrawal."  "[place] two [place] if I had a guarantee I could stay sober."  And "I feel like my family and you folks get me in here saying this "this and this will be available" but then I get here and it "if this" and "if that" and "if when."  Patient was offered by others in group and facilitator.    Clide Dales 08/14/2011, 1:02 PM

## 2011-08-14 NOTE — Progress Notes (Signed)
[facility] Group Notes:  (Counselor/Nursing/MHT/Case Management/Adjunct)  08/14/2011 5:49 PM  Type of Therapy:  Group Therapy at 1:15  Participation Level:  Active  Participation Quality:  Sharing  Affect:  Angry  Cognitive:  Oriented  Insight:  Limited  Engagement in Group:  Limited  Engagement in Therapy:  None  Modes of Intervention:  Clarification and Support  Summary of Progress/Problems:  Megan Edwards was present for one third of group and expressed her anger and frustration about being here saying it is all because of boyfriend who provides certain things like transportation and in return is controlling her life by "going to my boss and telling them I have stolen money from him, taking charges out on me and threatening to have me arrested and jailed... So here I sit because I'd rather go through detox here than in jail." "I'm mad at myself for being trapped in this position.    Clide Dales 08/14/2011, 5:49 PM

## 2011-08-15 MED ORDER — METHOCARBAMOL 500 MG PO TABS
1000.0000 mg | ORAL_TABLET | Freq: Four times a day (QID) | ORAL | Status: DC | PRN
  Administered 2011-08-15 – 2011-08-19 (×10): 1000 mg via ORAL
  Filled 2011-08-15: qty 1
  Filled 2011-08-15 (×6): qty 2
  Filled 2011-08-15 (×3): qty 1
  Filled 2011-08-15: qty 2

## 2011-08-15 NOTE — Progress Notes (Signed)
[facility] Group Notes:  (Counselor/Nursing/MHT/Case Management/Adjunct)  08/15/2011 2:00 PM  Type of Therapy:  Psycho/Edu Group  Participation Level: Did Not Attend   Takuya Lariccia C 08/15/2011, 2:00 PM  

## 2011-08-15 NOTE — Progress Notes (Signed)
Pt has been up and has been active while in the milieu today, has been actively participating in various activities, pt did speak about having some withdrawal still and having some general body aches, pt also spoke about wanting to go to [facility] on Tuesday, pt has received all medications without incident, support provided, will continue to monitor

## 2011-08-15 NOTE — Progress Notes (Signed)
Patient ID: Megan Edwards, female   DOB: 05-10-1975, 36 y.o.   MRN: [id number]  Pt was pleasant and still continues to complain of generalized pain. Requested Robaxin, and was given.

## 2011-08-15 NOTE — Progress Notes (Signed)
[facility] MD Progress Note  08/15/2011 3:12 PM  Current Mental Status: Patient seen and evaluated. Chart reviewed. Patient stated that her mood was "better". Sig w/d s/s noted off methadone with continued muscle cramps/spasms. Her affect was mood congruent and anxious. She denied any current thoughts of self injurious behavior, suicidal ideation or homicidal ideation. There were no auditory or visual hallucinations, paranoia, delusional thought processes, or mania noted. Thought process was linear and goal directed. Psychomotor agitation noted. Speech was normal rate, tone and volume. Eye contact was good. Judgment and insight are fair. Patient has been up and engaged on the unit. No acute safety concerns reported from team.   Sleep:  Number of Hours: 6    Vital Signs:Blood pressure 134/84, pulse 97, temperature 98.9 F (37.2 C), temperature source Oral, resp. rate 16, height 5\' 6"  (1.676 m), weight 80.967 kg (178 lb 8 oz), last menstrual period 08/07/2011.  Lab Results: No results found for this or any previous visit (from the past 48 hour(s)).  Physical Findings: CIWA:  CIWA-Ar Total: 3  COWS:  COWS Total Score: 12   CLINICAL FACTORS: Alcohol Dependence & W/D; Cocaine Dependence; Opioid Dependence & W/D; Mood Disorder NOS; BPAD, per Hx; r/o PD NOS with Cluster B Traits   PLAN OF CARE: Increased robaxin to 1000mg  po 6hrs prn.  Will follow.  Treatment goals and medication management reviewed with patient.  Continue current treatment plan and medications.  No SEs reported at current dosages.  No acute medical or psychiatric issues noted.  Pt agreeable with plan.  Discussed with team.   Lupe Carney 08/15/2011, 3:12 PM

## 2011-08-15 NOTE — Progress Notes (Signed)
[facility] Group Notes:  (Counselor/Nursing/MHT/Case Management/Adjunct)  08/15/2011 11:55 AM  Type of Therapy:  Group Therapy  Participation Level:  Minimal  Participation Quality:  Attentive and Sharing  Affect:  Depressed  Cognitive:  Alert and Oriented  Insight:  Limited  Engagement in Group:  Limited  Engagement in Therapy:  Limited  Modes of Intervention:  Clarification, Orientation, Problem-solving and Support  Summary of Progress/Problems:  Therapist began group with a brief check-in.  Therapist prompted Pt to verbalize one thing she would like to change about herself.  Patient actively participated by disclosing that she would like to change how angry and aggressive she gets when she is manic.  She reports that Seroquel and Tegretol have been therapeutic. Pt was receptive to feedback..  Therapist offered support and encouragement.    Christen Butter 08/15/2011, 11:55 AM

## 2011-08-15 NOTE — Progress Notes (Signed)
Pt attended discharge planning group and actively participated.  Pt presents with flat affect and depressed mood.  Pt reports feeling high anxiety today.  Pt states that she is still having severe withdrawal symptoms today, with chills and pain.  Pt states that she feels the meds are helping her now.  Pt is still unsure if she will go to residential treatment.  Pt states that the thought of going to another facility makes her want to jump off of a bridge.  Pt then states that she wants to keep her appointment with [facility] though in case she changes her mind.  No further needs voiced by pt at this time.    Reyes Ivan, LCSWA 08/15/2011  9:51 AM

## 2011-08-15 NOTE — Progress Notes (Signed)
08/15/2011         Time: 1415      Group Topic/Focus: The focus of this group is on discussing various styles of communication and communicating assertively using 'I' (feeling) statements.  Participation Level: Minimal  Participation Quality: Intrusive  Affect: Excited  Cognitive: Oriented   Additional Comments: Patient late to group, silly and easily distracted by a female peer. Patient frequently making statements like "I just like to get high."  [name] 08/15/2011 3:47 PM

## 2011-08-15 NOTE — Progress Notes (Signed)
Pt denies SI/HI/AVH. Pt rates her depression, hopelessness, and anxiety 5. Pt visible on the unit today and attended groups. Support and encouragement offered. Pt receptive.

## 2011-08-16 MED ORDER — QUETIAPINE FUMARATE 100 MG PO TABS
100.0000 mg | ORAL_TABLET | Freq: Two times a day (BID) | ORAL | Status: DC
  Administered 2011-08-16 – 2011-08-20 (×9): 100 mg via ORAL
  Filled 2011-08-16: qty 18
  Filled 2011-08-16 (×8): qty 1
  Filled 2011-08-16: qty 18
  Filled 2011-08-16 (×4): qty 1

## 2011-08-16 MED ORDER — QUETIAPINE FUMARATE 50 MG PO TABS
50.0000 mg | ORAL_TABLET | Freq: Every day | ORAL | Status: DC
  Administered 2011-08-17 – 2011-08-20 (×4): 50 mg via ORAL
  Filled 2011-08-16 (×6): qty 1

## 2011-08-16 MED ORDER — GABAPENTIN 300 MG PO CAPS
300.0000 mg | ORAL_CAPSULE | Freq: Three times a day (TID) | ORAL | Status: DC
  Administered 2011-08-16 – 2011-08-20 (×13): 300 mg via ORAL
  Filled 2011-08-16 (×3): qty 1
  Filled 2011-08-16: qty 21
  Filled 2011-08-16 (×10): qty 1
  Filled 2011-08-16 (×2): qty 21
  Filled 2011-08-16 (×2): qty 1

## 2011-08-16 MED ORDER — QUETIAPINE FUMARATE 200 MG PO TABS
200.0000 mg | ORAL_TABLET | Freq: Every day | ORAL | Status: DC
  Administered 2011-08-16 – 2011-08-19 (×4): 200 mg via ORAL
  Filled 2011-08-16 (×2): qty 1
  Filled 2011-08-16: qty 7
  Filled 2011-08-16 (×3): qty 1

## 2011-08-16 NOTE — Progress Notes (Signed)
Pt attended discharge planning group and actively participated.  Pt presents with tearful affect and anxious mood.  Pt states that she is doing very poor today and in a lot of pain.  Pt states that she did not sleep last night due to aches and pain from withdrawal.  Pt states that she is still not sure about if she will go to [facility] or [facility] at d/c or will do outpatient instead.  Pt is scheduled to go to [facility] on Tuesday.  No further needs voiced by pt at this time.  Safety planning and suicide prevention discussed.     Reyes Ivan, LCSWA 08/16/2011  10:53 AM

## 2011-08-16 NOTE — Progress Notes (Signed)
Pt is reporting psychological withdraw symptoms of anxiety, hopelessness, and crying spells. Along with this Clinical research associate and self-motivation pt did make it to group this evening. Pt is very knowledgeable of the expected withdrawal process from methadone. Pt seems to express good insight of future management of her addictions. Pt reports having a sponsor and future plans of attending meetings. Continued support and availability as needed has been extended to this pt. Pt safety remains with q67min checks.

## 2011-08-16 NOTE — Progress Notes (Signed)
[facility] Group Notes:  (Counselor/Nursing/MHT/Case Management/Adjunct)  08/16/2011 1:00 PM  Type of Therapy:  Psych/Edu Group Therapy  Participation Level:  Did not attend    Pierra Skora C 08/16/2011, 2:00 PM  

## 2011-08-16 NOTE — Progress Notes (Signed)
Pt has been up intermittently during the day today, limited interaction or participation, pt did speak about having a difficult withdrawal, did endorse feelings of depression today and anxiety, pt has received all medications today without incident, support provided, will continue to monitor

## 2011-08-16 NOTE — Progress Notes (Signed)
Patient ID: Darden Amber, female   DOB: [birth year], 36 y.o.   MRN: [id number] Megan Edwards presents fully alert, well groomed with makeup on. Tearful, complaining of chronic myalgia and feeling generally miserable with methadone withdrawal symptoms. Complaining of leg cramps, generalized muscle aches, and some clear rhinorrhea.Slept well until 3am, then could not get back to sleep.  Feels agitated with the withdrawal symptoms, and feels safe. She is cooperative today and asking for some additional help with withdrawal symptoms and feelings of anxiety and agitation  Mental status exam: Somewhat tearful, but fully alert, and cooperative with good hygiene. She is well groomed. She appears in no distress but subjectively complains of some agitation. Denies dangerous ideas or intent to harm herself. Mildly irritable. Speech nonpressured. Vital signs are essentially normal.  Plan: We'll increase Seroquel to 100 mg every morning, 50 mg at noon, 100 mg at 1600, and 200 mg each bedtime.

## 2011-08-16 NOTE — Progress Notes (Signed)
[facility] Group Notes:  (Counselor/Nursing/MHT/Case Management/Adjunct)  08/16/2011 11:00 AM  Type of Therapy:  Group Therapy  Participation Level:  Minimal  Participation Quality:  Attentive and Sharing  Affect:   Depressed, angry, blunted  Cognitive:  Alert and Oriented  Insight:  Limited  Engagement in Group:  Limited  Engagement in Therapy:  Limited  Modes of Intervention:  Clarification, Orientation, Problem-solving and Support  Summary of Progress/Problems:  Therapist prompted Patients to identify barriers to recovery.  Patients engaged in a heated discussion which affirmed that until a person makes decides on their own and usually due to severe consequences of their addiction, not treatment will keep a person from using AOD.  Patient explained that she was tired of taking suggestions due to becoming addicted to medications that had been recommended.  She stated she was having a hard detox off  Methadone. Pt expressed feeling betrayed by her family for making her keep going to treatment.  One patient began using some negative language, that offended another patient.  Therapist explained that being respectful was a recovery attitude and asked that the objectionable language be stopped. Therapist encouraged patients to avoid conflict while in treatment as emotions are raw.  Pt expressed feelings and gave feedback from personal experience. Therapist offered support and encouragement.    Marni Griffon C 08/16/2011, 12:00 PM

## 2011-08-16 NOTE — Tx Team (Signed)
Interdisciplinary Treatment Plan Update (Adult)  Date:  08/16/2011  Time Reviewed:  10:07 AM   Progress in Treatment: Attending groups: Yes Participating in groups:  Yes Taking medication as prescribed: Yes Tolerating medication:  Yes Family/Significant othe contact made:  No, pt refused.  Patient understands diagnosis:  Yes Discussing patient identified problems/goals with staff:  Yes Medical problems stabilized or resolved:  Yes Denies suicidal/homicidal ideation: Yes Issues/concerns per patient self-inventory:  None identified Other: N/A  New problem(s) identified: None Identified  Reason for Continuation of Hospitalization: Anxiety Depression Medication stabilization  Interventions implemented related to continuation of hospitalization: mood stabilization, medication monitoring and adjustment, group therapy and psycho education, safety checks q 15 mins  Additional comments: N/A  Estimated length of stay: 3-4 days  Discharge Plan: Pt will follow up at [facility] for medication management and therapy.    New goal(s): N/A  Review of initial/current patient goals per problem list:    1.  Goal(s): Address substance use  Met:  No  Target date: by discharge  As evidenced by: completing detox protocol and refer to appropriate treatment  2.  Goal (s): Reduce depressive and anxiety symptoms  Met:  No  Target date: by discharge  As evidenced by: Reducing depression from a 10 to a 3 as reported by pt.    3.  Goal(s): Eliminate SI  Met:  No  Target date: by discharge  As evidenced by: Pt denying SI.    Attendees: Patient:     Family:     Physician:  Lupe Carney, DO 08/16/2011 10:07 AM   Nursing: Berneice Heinrich, RN 08/16/2011 10:07 AM   Case Manager:  Reyes Ivan, LCSWA 08/16/2011  10:07 AM   Counselor:Lilla Roxan Hockey, LCAS 08/16/2011  10:07 AM   Other:  Richelle Ito, LCSW 08/16/2011 10:07 AM   Other:     Other:     Other:      Scribe for Treatment Team:     Reyes Ivan 08/16/2011 10:07 AM

## 2011-08-16 NOTE — Progress Notes (Signed)
Lying quietly in bed with eyes closed.  Safety checks are being conducted Q 15 minutes to maintain safety.

## 2011-08-16 NOTE — Progress Notes (Deleted)
[facility] Group Notes:  (Counselor/Nursing/MHT/Case Management/Adjunct)  08/16/2011 11:00 AM  Type of Therapy:  Group Therapy  Participation Level:  Did not attend   Korey Prashad C 08/16/2011, 12:00 PM  

## 2011-08-17 DIAGNOSIS — F1920 Other psychoactive substance dependence, uncomplicated: Secondary | ICD-10-CM

## 2011-08-17 NOTE — Progress Notes (Signed)
  Megan Edwards is a 36 y.o. female [id number] 04-Feb-1976  08/10/2011 Principal Problem:  *Methadone dependence Active Problems:  Polysubstance (excluding opioids) dependence   Mental Status: Alert& oriented denies SI/HI/AVH    Subjective/Objective: Seen in room in bed trying to get over a migraine.     Filed Vitals:   08/17/11 0622  BP: 143/103  Pulse: 103  Temp:   Resp:     Lab Results:   BMET    Component Value Date/Time   NA 136 08/09/2011 1730   K 3.9 08/09/2011 1730   CL 101 08/09/2011 1730   CO2 25 08/09/2011 1730   GLUCOSE 131* 08/09/2011 1730   BUN 9 08/09/2011 1730   CREATININE 0.75 08/09/2011 1730   CALCIUM 8.5 08/09/2011 1730   GFRNONAA >90 08/09/2011 1730   GFRAA >90 08/09/2011 1730    Medications:  Scheduled:     . carbamazepine  200 mg Oral TID  . cloNIDine  0.1 mg Oral TID   Followed by  . cloNIDine  0.1 mg Oral BID  . gabapentin  300 mg Oral TID  . lidocaine  1 patch Transdermal Q24H  . mulitivitamin with minerals  1 tablet Oral Daily  . QUEtiapine  100 mg Oral BID WC  . QUEtiapine  200 mg Oral QHS  . QUEtiapine  50 mg Oral Q1200  . thiamine  100 mg Intramuscular Once  . thiamine  100 mg Oral Daily  . traZODone  200 mg Oral QHS,MR X 1     PRN Meds alum & mag hydroxide-simeth, aspirin-acetaminophen-caffeine, chlordiazePOXIDE, chlordiazePOXIDE, magnesium hydroxide, methocarbamol  Plan: continue current plan of care.  Annye Forrey,MICKIE D. 08/17/2011

## 2011-08-17 NOTE — Progress Notes (Signed)
Pt is experiencing the psychological symptom of anxiety related to her withdrawals. Pt also complains of a headache that was given some relief with Excedrin. Pt has been observed interacting approprietly with others and attending groups. Continued support and availability as needed has been extended to this pt. Pt safety remains with q75min checks.

## 2011-08-17 NOTE — Progress Notes (Signed)
Pt is on unit interacting with peers and attending groups with minimal participation. Continues to c/o multiple s/s of withdrawal including diarrhea(not observed),tremors,chills,cravings and agitation.  C/o HA and prn medications requested and received.  Did not rate depression/anxiety.  Denies SI.  Ongoing support offered and 15' checks cont for safety.

## 2011-08-17 NOTE — Progress Notes (Signed)
Patient ID: Megan Edwards, female   DOB: [birth year], 36 y.o.   MRN: [id number]  [facility] Group Notes:  (Counselor/Nursing/MHT/Case Management/Adjunct)  08/17/2011 1:15 PM  Type of Therapy:  Group Therapy, Dance/Movement Therapy   Participation Level:  Minimal  Participation Quality:  Inattentive  Affect:  Flat  Cognitive:  Oriented  Insight:  Limited  Engagement in Group:  Limited  Engagement in Therapy:  Limited  Modes of Intervention:  Clarification, Problem-solving, Role-play, Socialization and Support  Summary of Progress/Problems: The therapist discussed recovery and invited group participants to share their personal definition of recovery. Therapist shared two poems, "I am your disease" and "I am your recovery" with the group and invited group members to share their thoughts and opinions. Kampbell stated that she felt "good" but left shortly after group started.     [name]

## 2011-08-17 NOTE — Progress Notes (Signed)
Patient ID: Megan Edwards, female   DOB: 01/02/1976, 36 y.o.   MRN: [id number]  Pt. attended aftercare planning group. Pt. reported that her anxiety was at a 9 and her depression was at a 7. Pt. stated that she did not want to be in the hospital. Pt. did not participate after the check-in and left shortly after group had begun.

## 2011-08-18 MED ORDER — CLONIDINE HCL 0.1 MG/24HR TD PTWK
0.1000 mg | MEDICATED_PATCH | TRANSDERMAL | Status: DC
  Administered 2011-08-18: 0.1 mg via TRANSDERMAL
  Filled 2011-08-18: qty 1

## 2011-08-18 MED ORDER — BUTALBITAL-APAP-CAFFEINE 50-325-40 MG PO TABS
2.0000 | ORAL_TABLET | Freq: Four times a day (QID) | ORAL | Status: DC | PRN
  Administered 2011-08-19: 2 via ORAL
  Filled 2011-08-18: qty 2

## 2011-08-18 NOTE — Progress Notes (Signed)
  Megan Edwards is a 36 y.o. female [id number] 1975/12/17  08/10/2011 Principal Problem:  *Methadone dependence Active Problems:  Polysubstance (excluding opioids) dependence   Mental Status: Mood is better than last weekend denies SI/HI/AVH.  Subjective/Objective:  Having a hard detox .Asking for Fioricet to get rid of headache.  Filed Vitals:   08/18/11 0701  BP: 159/101  Pulse: 91  Temp:   Resp:     Lab Results:   BMET    Component Value Date/Time   NA 136 08/09/2011 1730   K 3.9 08/09/2011 1730   CL 101 08/09/2011 1730   CO2 25 08/09/2011 1730   GLUCOSE 131* 08/09/2011 1730   BUN 9 08/09/2011 1730   CREATININE 0.75 08/09/2011 1730   CALCIUM 8.5 08/09/2011 1730   GFRNONAA >90 08/09/2011 1730   GFRAA >90 08/09/2011 1730    Medications:  Scheduled:     . carbamazepine  200 mg Oral TID  . cloNIDine  0.1 mg Oral BID  . gabapentin  300 mg Oral TID  . lidocaine  1 patch Transdermal Q24H  . mulitivitamin with minerals  1 tablet Oral Daily  . QUEtiapine  100 mg Oral BID WC  . QUEtiapine  200 mg Oral QHS  . QUEtiapine  50 mg Oral Q1200  . thiamine  100 mg Intramuscular Once  . thiamine  100 mg Oral Daily  . traZODone  200 mg Oral QHS,MR X 1     PRN Meds alum & mag hydroxide-simeth, aspirin-acetaminophen-caffeine, chlordiazePOXIDE, chlordiazePOXIDE, magnesium hydroxide, methocarbamol Plan: change po clonidine to a patch.  Megan Edwards,Megan Edwards. 08/18/2011

## 2011-08-18 NOTE — Progress Notes (Signed)
Patient ID: Megan Edwards, female   DOB: 04-19-1975, 36 y.o.   MRN: [id number]   [facility] Group Notes:  (Counselor/Nursing/MHT/Case Management/Adjunct)  08/18/2011 1:15 PM  Type of Therapy:  Group Therapy, Dance/Movement Therapy   Participation Level:  Active  Participation Quality:  Appropriate and Attentive  Affect:  Appropriate   Cognitive:  Appropriate  Insight:  Good  Engagement in Group:  Good  Engagement in Therapy:  Good  Modes of Intervention:  Clarification, Problem-solving, Role-play, Socialization and Support  Summary of Progress/Problems: Therapist discussed letting go of addiction and adapting new and effective coping skills. Therapist invited group members to share negative things they would be leaving behind once they were discharged and modeled activity and drawing a hand on a sheet of paper and writ ng a healthy support or coping skill on each finger. [name] shared that she would like to leave behind her co-dependency once she is discharged. During group, [name] was questioned about her number of visits to the hospital and became extremely anger which caused a verbal altercation between she and the other patient. Therapist was able to calm [name] down. Once calm, [name] took a deep breathe and was able to continue to participate and process with the group.     [name]

## 2011-08-18 NOTE — Progress Notes (Signed)
Patient ID: Megan Edwards, female   DOB: 10/21/1975, 36 y.o.   MRN: [id number]  Pt. attended  aftercare planning group. Pt. listed their current anxiety level as 8 and current depression level as 5. Pt. stated that she feels "cooped up."

## 2011-08-18 NOTE — Progress Notes (Signed)
Pt reports the withdrawal symptoms of anxiety and muscle spasms. Pt anxiety and muscle spasms has been effectively managed with librium and robaxin. Pt attended this evenings AA meeting. Pt observed interacting appropriately on the unit. Continued support and availability as needed has been extended to this pt. Pt safety remains with q70min checks.

## 2011-08-18 NOTE — Progress Notes (Signed)
Patient ID: Megan Edwards, female   DOB: 07-02-1975, 36 y.o.   MRN: [id number] Has been sleeping, eyes closed, no c/o's voiced, resp reg and unlabored.  Will continue to monitor. 0500  Up, showered, requested and was given robaxin for back spasms.  Will continue to monitor.

## 2011-08-18 NOTE — Progress Notes (Signed)
Pt has been up and has been participating in various milieu activities during the day, pt has stated that she is feeling better but that she is still having various withdrawal symptoms, pt spoke about not sleeping well last night, and spoke about having some cravings, pt received all medications today without incident, support and encouragement provided, will continue to monitor

## 2011-08-19 MED ORDER — TRAZODONE HCL 100 MG PO TABS: ORAL_TABLET | ORAL | Status: DC

## 2011-08-19 MED ORDER — CARBAMAZEPINE 200 MG PO TABS: 200.0000 mg | ORAL_TABLET | Freq: Three times a day (TID) | ORAL | Status: DC

## 2011-08-19 MED ORDER — QUETIAPINE FUMARATE 200 MG PO TABS: 200.0000 mg | ORAL_TABLET | Freq: Every day | ORAL | Status: DC

## 2011-08-19 MED ORDER — QUETIAPINE FUMARATE 100 MG PO TABS: ORAL_TABLET | ORAL | Status: DC

## 2011-08-19 MED ORDER — GABAPENTIN 300 MG PO CAPS: 300.0000 mg | ORAL_CAPSULE | Freq: Three times a day (TID) | ORAL | Status: DC

## 2011-08-19 NOTE — Progress Notes (Signed)
Pt attended discharge planning group and actively participated.  Pt presents with calm mood and affect.  Pt denies SI and depression and ranks anxiety as high today.  Pt states that she is doing a lot better with her detox today.  Pt states that she has an important court date on Thursday that she is not allowed to miss.  Pt states that it has been continued for a year now.  Pt states that she has decided to go to [facility] and is scheduled to go tomorrow.  Pt wants to go but requests to have date pushed out after court date.  SW called and left a message for [facility] for a new appointment date.  Pt states that she will be able to stay with her sponsor until she goes to [facility]. No further needs voiced by pt at this time.    Reyes Ivan, LCSWA 08/19/2011  10:31 AM

## 2011-08-19 NOTE — Tx Team (Signed)
Interdisciplinary Treatment Plan Update (Adult)  Date:  08/19/2011  Time Reviewed:  10:03 AM   Progress in Treatment: Attending groups: Yes Participating in groups:  Yes Taking medication as prescribed: Yes Tolerating medication:  Yes Family/Significant othe contact made:   Patient understands diagnosis:  Yes Discussing patient identified problems/goals with staff:  Yes Medical problems stabilized or resolved:  Yes Denies suicidal/homicidal ideation: Yes Issues/concerns per patient self-inventory:  None identified Other: N/A  New problem(s) identified: None Identified  Reason for Continuation of Hospitalization: Anxiety Medication stabilization  Interventions implemented related to continuation of hospitalization: mood stabilization, medication monitoring and adjustment, group therapy and psycho education, safety checks q 15 mins  Additional comments: N/A  Estimated length of stay:  Discharge Plan: Pt will stay with sponsor until [facility] appointment  New goal(s): N/A  Review of initial/current patient goals per problem list:    1.  Goal(s): Address substance use  Met:  No  Target date: by discharge  As evidenced by: completing detox protocol and refer to appropriate treatment  2.  Goal (s): Reduce depressive and anxiety symptoms  Met:  No  Target date: by discharge  As evidenced by: Reducing depression from a 10 to a 3 as reported by pt.    3.  Goal(s): Eliminate SI  Met:  No  Target date: by discharge  As evidenced by: pt denying SI   Attendees: Patient:  Megan Edwards 08/19/2011 10:04 AM   Family:     Physician:  Lupe Carney, DO 08/19/2011 10:03 AM   Nursing: Roswell Miners, RN 08/19/2011 10:03 AM   Case Manager:  Reyes Ivan, LCSWA 08/19/2011  10:03 AM   Counselor:  Ronda Fairly, LCSWA 08/19/2011  10:03 AM   Other:  Robbie Louis, RN 08/19/2011 10:03 AM   Other:     Other:     Other:      Scribe for Treatment Team:     Reyes Ivan 08/19/2011 10:03 AM

## 2011-08-19 NOTE — Progress Notes (Signed)
Currently resting quietly in bed in right lateral position with eyes closed. Respirations are even and unlabored. No acute distress or discomfort noted. Safety has been maintained with Q15 minute observation. Will continue current POC. 

## 2011-08-19 NOTE — Progress Notes (Signed)
Patient ID: Megan Edwards, female   DOB: [birth year], 36 y.o.   MRN: [id number] SHE HAS BEEN UP AND ABOUT AND TO GROUPS INTERACTING  WITH PEERS AND STAFF. HAS C/O AND RECEIVED PRN MEDICATIONS FOR  HEADACHE AND ANXIETY.  MEDICATIONS WERE EFFECTIVE.Marland Kitchen
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
## 2011-08-20 MED ORDER — TRAZODONE HCL 100 MG PO TABS
200.0000 mg | ORAL_TABLET | Freq: Every evening | ORAL | Status: DC | PRN
  Filled 2011-08-20: qty 14

## 2011-08-20 NOTE — Discharge Summary (Signed)
Physician Discharge Summary Note  Patient:  Megan Edwards is an 36 y.o., female MRN:  [id number] DOB:  [birth year] Patient phone:  [phone] (home)  Patient address:   [address] [place] [address],   Date of Admission:  08/10/2011 Date of Discharge  08/20/2011  Discharge Diagnoses:  AXIS I: Alcohol Dependence; Cocaine Dependence; Opioid Dependence; Mood Disorder NOS; BPAD, per Hx  AXIS II: r/o PD NOS with Cluster B Traits  AXIS III: No chronic medical issues.  AXIS IV: Moderate  AXIS V: 50   Hospital Course:  [name] presented requesting detox from alcohol and methadone. She had been attending a methadone maintenance clinic and had been taking 100 mg daily and previous 8 months. She had also relapsed on cocaine, and alcohol she said that she wanted to be clean and abstinent from all substances again, and was "tired of being sick."  She reported regular use of 4 40-oz. Beers daily. She denied suicidal plans or intent, but said she could not go on living taking drugs and alcohol.Please refer to the psychiatric admission note for details regarding the events and circumstances leading to admission.    She was admitted to our dual diagnosis unit and methadone was discontinued. She was detoxed with an extended clonidine taper. Methocarbamol was increased to 1000 mg 3 times a day for muscle aches, and trazodone was increased to 200 mg each bedtime to facilitate sleep. As her detox progressed, she complained of increased opiate cravings in some mild agitation. Seroquel was added and titrated to effectiveness.Tegretol was added for mood stability. Although she had significant issues with myalgias and anxiety, she did very well with the detox. She clearly and consistenly denied any suicidal thoughts.   Group therapy participation was satisfactory.Our counselors helped her identify relapse triggers, and develop a relapse prevention program.    And she voiced or intent to followup  with residential treatment after she dealt with some legal issues that she had. Meanwhile she was beginning to attend Narcotics Anonymous, and alcoholics anonymous meetings.  Consults:  None  Significant Diagnostic Studies:  Initial alcohol screen 256 mg percent. Urine drug screen was positive for cocaine. Electrolytes, liver enzymes, and CBC were normal.  Discharge Vitals:   Blood pressure 116/71, pulse 93, temperature 97.5 F (36.4 C), temperature source Oral, resp. rate 20, height 5\' 6"  (1.676 m), weight 80.967 kg (178 lb 8 oz), last menstrual period 08/07/2011.  Mental Status Exam: See Mental Status Examination and Suicide Risk Assessment completed by Attending Physician prior to discharge.  Discharge destination:  Home  Is patient on multiple antipsychotic therapies at discharge:  No   Has Patient had three or more failed trials of antipsychotic monotherapy by history:  No  Recommended Plan for Multiple Antipsychotic Therapies: N/A   Medication List  As of 08/20/2011  8:00 AM   TAKE these medications      Indication    carbamazepine 200 MG tablet   Commonly known as: TEGRETOL   Take 1 tablet (200 mg total) by mouth 3 (three) times daily. For mood stability       gabapentin 300 MG capsule   Commonly known as: NEURONTIN   Take 1 capsule (300 mg total) by mouth 3 (three) times daily. For anxiety.       QUEtiapine 100 MG tablet   Commonly known as: SEROQUEL   Take one tablet (100mg ) twice daily at 8am and 4pm, and one-half tablet (50mg ) daily at 12 noon. For mood stability  and agitation.       QUEtiapine 200 MG tablet   Commonly known as: SEROQUEL   Take 1 tablet (200 mg total) by mouth at bedtime. For mood stability and agitation       traZODone 100 MG tablet   Commonly known as: DESYREL   Take two tablets (200mg ) daily at bedtime, as needed for sleep.            Follow-up Information    Follow up with [facility] on 08/20/2011. (Arrive at 8:00 am on this  date!)    Contact information:   [address] [place], [place] [address] [phone]         Follow-up recommendations:  Activity:  unrestricted Diet:  regular  Signed: Malayla Granberry A 08/20/2011, 8:00 AM

## 2011-08-20 NOTE — Progress Notes (Addendum)
[facility] Case Management Discharge Plan:  Will you be returning to the same living situation after discharge: No. Will reside with sponsor until going to [facility] At discharge, do you have transportation home?:Yes,  access to transportation Do you have the ability to pay for your medications:Yes,  access to meds, provided samples  Release of information consent forms completed and in the chart;  Patient's signature needed at discharge.  Patient to Follow up at:  Follow-up Information    Follow up with [facility] on 08/28/2011. (Arrive there at 8:00 am)    Contact information:   [address] [place], [place] [address] [phone]         Patient denies SI/HI:   Yes,  denies SI/HI    Safety Planning and Suicide Prevention discussed:  Yes,  discussed with pt  Barrier to discharge identified:No.  Summary and Recommendations: Pt attended discharge planning group and actively participated.  Pt presents with calm mood and affect.  Pt denies having depression and SI and ranks anxiety at a 10 today.  Pt reports feeling stable to d/c today.  No recommendations from SW.  No further needs voiced by pt.  Pt stable to discharge.     Carmina Miller 08/20/2011, 10:00 AM

## 2011-08-20 NOTE — Progress Notes (Signed)
Patient ID: Megan Edwards, female   DOB: [birth year], 36 y.o.   MRN: [id number] Pt was discharged. Was picked up by her mother. Megan Edwards voiced that she understood discharge instruction and that the case manager was to call her at ( [phone]) with a appointment for when she could go to [facility].  She denies thought of SI, all belongings taken home with her.

## 2011-08-20 NOTE — Discharge Summary (Signed)
Pt seen and evaluated today for final discharge visit today in team.  Completed Admission Suicide Risk Assessment yesterday.  No Ms changes today and pt stable for discharge.  See orders.  Pt agreeable with plan.  Discussed with team.

## 2011-08-20 NOTE — Tx Team (Signed)
Interdisciplinary Treatment Plan Update (Adult)  Date:  08/20/2011  Time Reviewed:  9:56 AM   Progress in Treatment: Attending groups: Yes Participating in groups:  Yes Taking medication as prescribed: Yes Tolerating medication:  Yes Family/Significant othe contact made:  No Patient understands diagnosis:  Yes Discussing patient identified problems/goals with staff:  Yes Medical problems stabilized or resolved:  Yes Denies suicidal/homicidal ideation: Yes Issues/concerns per patient self-inventory:  None identified Other: N/A  New problem(s) identified: None Identified  Reason for Continuation of Hospitalization: Stable to d/c  Interventions implemented related to continuation of hospitalization: Stable to d/c  Additional comments: N/A  Estimated length of stay: D/C today  Discharge Plan: Pt will follow up at [facility] for further treatment.  New goal(s): N/A  Review of initial/current patient goals per problem list:    1.  Goal(s): Address substance use  Met:  Yes  Target date: by discharge  As evidenced by: completed detox protocol and referred to appropriate treatment  2.  Goal (s): Reduce depressive and anxiety symptoms  Met:  Yes  Target date: by discharge  As evidenced by: Reducing depression from a 10 to a 3 as reported by pt.  Pt denies depression and ranks anxiety as high but reports feeling stable to d/c today.   3.  Goal(s): Eliminate SI  Met:  Yes  Target date: by discharge  As evidenced by: pt denies SI.     Attendees: Patient:  Megan Edwards 08/20/2011 10:00 AM   Family:     Physician:  Lupe Carney, DO 08/20/2011 9:56 AM   Nursing: Roswell Miners, RN 08/20/2011 9:56 AM   Case Manager:  Reyes Ivan, LCSWA 08/20/2011  9:56 AM   Counselor:  Ronda Fairly, LCSWA 08/20/2011  9:56 AM   Other:  Richelle Ito, LCSW 08/20/2011 9:56 AM   Other:  Robbie Louis, RN 08/20/2011 10:00 AM   Other:     Other:      Scribe for Treatment  Team:   Reyes Ivan 08/20/2011 9:56 AM

## 2011-08-20 NOTE — Progress Notes (Signed)
Patient ID: Megan Edwards, female   DOB: 1975-06-19, 36 y.o.   MRN: [id number] Pt was pleasant and cooperative during the assessment. Had no complaints or concerns. Informed the writer that she's to be discharged tomorrow.

## 2011-08-22 NOTE — Progress Notes (Signed)
Patient Discharge Instructions:  After Visit Summary (AVS):   Faxed to:  08/21/2011 Psychiatric Admission Assessment Note:   Faxed to:  08/21/2011 Suicide Risk Assessment - Discharge Assessment:   Faxed to:  08/21/2011 Faxed/Sent to the Next Level Care provider:  08/21/2011  Faxed to [facility] @ [phone]  Wandra Scot, 08/22/2011, 5:34 PM

## 2012-03-25 NOTE — L&D Delivery Note (Signed)
Delivery Note At 12:37 PM a viable female was delivered via Vaginal, Spontaneous Delivery (Presentation: Vertex ; Occiput Anterior).  APGAR: 7-9, ; weight 8 lb 15.9 oz (4080 g).   Placenta status: Intact, Spontaneous.  Cord: 3 vessels with the following complications: None.  Cord pH: none  Anesthesia: Spinal  Episiotomy:  Lacerations: 2nd degree; Perineal Suture Repair: 2.0 Est. Blood Loss (mL): 900  Mom to postpartum.  Baby to nursery-stable.  HARPER,CHARLES A 11/26/2012, 1:12 PM

## 2012-04-10 ENCOUNTER — Inpatient Hospital Stay (HOSPITAL_COMMUNITY): Payer: Medicaid Other

## 2012-04-10 ENCOUNTER — Encounter (HOSPITAL_COMMUNITY): Payer: Self-pay

## 2012-04-10 ENCOUNTER — Inpatient Hospital Stay (HOSPITAL_COMMUNITY)
Admission: AD | Admit: 2012-04-10 | Discharge: 2012-04-10 | Disposition: A | Discharge status: Home / Self Care | Payer: Medicaid Other | Source: Ambulatory Visit | Attending: Obstetrics & Gynecology | Admitting: Obstetrics & Gynecology

## 2012-04-10 DIAGNOSIS — F319 Bipolar disorder, unspecified: Secondary | ICD-10-CM | POA: Insufficient documentation

## 2012-04-10 DIAGNOSIS — Z3490 Encounter for supervision of normal pregnancy, unspecified, unspecified trimester: Secondary | ICD-10-CM

## 2012-04-10 DIAGNOSIS — O99891 Other specified diseases and conditions complicating pregnancy: Secondary | ICD-10-CM | POA: Insufficient documentation

## 2012-04-10 DIAGNOSIS — O99340 Other mental disorders complicating pregnancy, unspecified trimester: Secondary | ICD-10-CM | POA: Insufficient documentation

## 2012-04-10 LAB — RAPID URINE DRUG SCREEN, HOSP PERFORMED
Barbiturates: NOT DETECTED
Benzodiazepines: NOT DETECTED

## 2012-04-10 LAB — HCG, QUANTITATIVE, PREGNANCY: hCG, Beta Chain, Quant, S: 65045 m[IU]/mL — ABNORMAL HIGH (ref <5)

## 2012-04-10 LAB — URINALYSIS, ROUTINE W REFLEX MICROSCOPIC
Bilirubin Urine: NEGATIVE
Glucose, UA: NEGATIVE mg/dL
Nitrite: NEGATIVE
Specific Gravity, Urine: 1.01 (ref 1.005–1.030)
pH: 6.5 (ref 5.0–8.0)

## 2012-04-10 LAB — URINE MICROSCOPIC-ADD ON

## 2012-04-10 LAB — ABO/RH: ABO/RH(D): A POS

## 2012-04-10 NOTE — MAU Note (Addendum)
Pt states here from pregnancy care center, was told could not see baby on u/s, sent here for further eval and lab work. Denies abnormal vag d/c or bleeding. LMP-02/20/2012. Notes cramping on r side, constant/dull. Began 3-4 days ago.

## 2012-04-10 NOTE — MAU Provider Note (Signed)
Chief Complaint: Early pregnant, r/o ectopic    None    SUBJECTIVE HPI: Megan Edwards is a 37 y.o. G4P0030 at [redacted]w[redacted]d by LMP who presents to maternity admissions with early pregnancy as her only report.  She was seen earlier today at [facility] and an abdominal U/S was performed without clear evidence of uterine pregnancy.  She was sent over from their office for f/u to r/o ectopic.  She denies abdominal pain, LOF, vaginal bleeding, vaginal itching/burning, urinary symptoms, h/a, dizziness, n/v, or fever/chills.    She has hx of opiate abuse and tx with methadone but reports she has been clean for 2 years.  She is currently on medications for bipolar and is worried about these in pregnancy.    History reviewed. No pertinent past medical history. Past Surgical History  Procedure Date  . Knee surgery   . Breast enhancement surgery    History   Social History  . Marital Status: Divorced    Spouse Name: N/A    Number of Children: N/A  . Years of Education: N/A   Occupational History  . Not on file.   Social History Main Topics  . Smoking status: Current Every Day Smoker    Types: Cigarettes  . Smokeless tobacco: Not on file  . Alcohol Use: Yes     Comment: 4 40's beer daily  . Drug Use: No  . Sexually Active: No   Other Topics Concern  . Not on file   Social History Narrative  . No narrative on file   No current facility-administered medications on file prior to encounter.   Current Outpatient Prescriptions on File Prior to Encounter  Medication Sig Dispense Refill  . carbamazepine (TEGRETOL) 200 MG tablet Take 1 tablet (200 mg total) by mouth 3 (three) times daily. For mood stability  90 tablet  0  . gabapentin (NEURONTIN) 300 MG capsule Take 1 capsule (300 mg total) by mouth 3 (three) times daily. For anxiety.  90 capsule  0  . QUEtiapine (SEROQUEL) 100 MG tablet Take one tablet (100mg ) twice daily at 8am and 4pm, and one-half tablet (50mg ) daily at  12 noon. For mood stability and agitation.  75 tablet  0  . traZODone (DESYREL) 100 MG tablet Take two tablets (200mg ) daily at bedtime, as needed for sleep.  60 tablet  0   Allergies  Allergen Reactions  . Penicillins Rash    ROS: Pertinent items in HPI  OBJECTIVE Blood pressure 141/81, pulse 94, temperature 98.5 F (36.9 C), temperature source Oral, resp. rate 16, height 5' 7.5" (1.715 m), weight 82.611 kg (182 lb 2 oz), last menstrual period 02/20/2012. GENERAL: Well-developed, well-nourished female in no acute distress.  HEENT: Normocephalic HEART: normal rate RESP: normal effort ABDOMEN: Soft, non-tender EXTREMITIES: Nontender, no edema NEURO: Alert and oriented SPECULUM EXAM: deferred   LAB RESULTS Results for orders placed during the hospital encounter of 04/10/12 (from the past 24 hour(s))  URINALYSIS, ROUTINE W REFLEX MICROSCOPIC     Status: Abnormal   Collection Time   04/10/12  1:20 PM      Component Value Range   Color, Urine STRAW (*) YELLOW   APPearance CLEAR  CLEAR   Specific Gravity, Urine 1.010  1.005 - 1.030   pH 6.5  5.0 - 8.0   Glucose, UA NEGATIVE  NEGATIVE mg/dL   Hgb urine dipstick TRACE (*) NEGATIVE   Bilirubin Urine NEGATIVE  NEGATIVE   Ketones, ur NEGATIVE  NEGATIVE mg/dL  Protein, ur NEGATIVE  NEGATIVE mg/dL   Urobilinogen, UA 0.2  0.0 - 1.0 mg/dL   Nitrite NEGATIVE  NEGATIVE   Leukocytes, UA NEGATIVE  NEGATIVE  POCT PREGNANCY, URINE     Status: Abnormal   Collection Time   04/10/12  1:20 PM      Component Value Range   Preg Test, Ur POSITIVE (*) NEGATIVE  URINE MICROSCOPIC-ADD ON     Status: Normal   Collection Time   04/10/12  1:20 PM      Component Value Range   Squamous Epithelial / LPF RARE  RARE   WBC, UA 0-2  <3 WBC/hpf   RBC / HPF 0-2  <3 RBC/hpf   Bacteria, UA RARE  RARE  HCG, QUANTITATIVE, PREGNANCY     Status: Abnormal   Collection Time   04/10/12  1:58 PM      Component Value Range   hCG, Beta Chain, Quant, [place] [address] (*) <5  mIU/mL  ABO/RH     Status: Normal   Collection Time   04/10/12  1:58 PM      Component Value Range   ABO/RH(D) A POS      IMAGING    ASSESSMENT 1. Normal IUP (intrauterine pregnancy) on prenatal ultrasound     PLAN Discharge home F/U with [facility] for prenatal care F/U with current mental health practitioner to review meds for bipolar, continue current regimen for now as symptoms are well managed Discussed hx of abuse and safety of methadone/suboxone treatment in pregnancy.  Pt plans not to use these medications for now but has contact information for previous clinic she has used. Return to MAU as needed    Medication List     As of 04/10/2012  4:04 PM    ASK your doctor about these medications         carbamazepine 200 MG tablet   Commonly known as: TEGRETOL   Take 1 tablet (200 mg total) by mouth 3 (three) times daily. For mood stability      gabapentin 300 MG capsule   Commonly known as: NEURONTIN   Take 1 capsule (300 mg total) by mouth 3 (three) times daily. For anxiety.      QUEtiapine 100 MG tablet   Commonly known as: SEROQUEL   Take one tablet (100mg ) twice daily at 8am and 4pm, and one-half tablet (50mg ) daily at 12 noon. For mood stability and agitation.      QUEtiapine 200 MG tablet   Commonly known as: SEROQUEL   Take 200 mg by mouth at bedtime.      traZODone 100 MG tablet   Commonly known as: DESYREL   Take two tablets (200mg ) daily at bedtime, as needed for sleep.           Follow-up Information    Follow up with [facility]. (Please call [facility] to make appointment.  Return to MAU as needed.)    Contact information:   [address] [place] [address] [phone]         Sharen Counter Certified Nurse-Midwife 04/10/2012  4:04 PM

## 2012-04-13 NOTE — MAU Provider Note (Signed)
Attestation of Attending Supervision of Advanced Practitioner (CNM/NP): Evaluation and management procedures were performed by the Advanced Practitioner under my supervision and collaboration.  I have reviewed the Advanced Practitioner's note and chart, and I agree with the management and plan.  HARRAWAY-SMITH, Dilia Alemany 11:49 AM

## 2012-06-17 ENCOUNTER — Ambulatory Visit (INDEPENDENT_AMBULATORY_CARE_PROVIDER_SITE_OTHER): Payer: Medicaid Other | Admitting: Obstetrics

## 2012-06-17 ENCOUNTER — Other Ambulatory Visit: Payer: Self-pay | Admitting: Obstetrics

## 2012-06-17 ENCOUNTER — Encounter: Payer: Self-pay | Admitting: Obstetrics

## 2012-06-17 VITALS — BP 123/70 | Temp 97.7°F | Wt 175.0 lb

## 2012-06-17 DIAGNOSIS — Z113 Encounter for screening for infections with a predominantly sexual mode of transmission: Secondary | ICD-10-CM

## 2012-06-17 DIAGNOSIS — O09522 Supervision of elderly multigravida, second trimester: Secondary | ICD-10-CM

## 2012-06-17 DIAGNOSIS — Z3201 Encounter for pregnancy test, result positive: Secondary | ICD-10-CM

## 2012-06-17 DIAGNOSIS — Z3400 Encounter for supervision of normal first pregnancy, unspecified trimester: Secondary | ICD-10-CM

## 2012-06-17 DIAGNOSIS — O09529 Supervision of elderly multigravida, unspecified trimester: Secondary | ICD-10-CM

## 2012-06-17 LAB — POCT URINALYSIS DIPSTICK
Bilirubin, UA: NEGATIVE
Ketones, UA: NEGATIVE
Leukocytes, UA: NEGATIVE

## 2012-06-17 LAB — OB RESULTS CONSOLE GC/CHLAMYDIA: Gonorrhea: NEGATIVE

## 2012-06-17 MED ORDER — VALACYCLOVIR HCL 1 G PO TABS: 1000.0000 mg | ORAL_TABLET | Freq: Every day | ORAL | Status: DC

## 2012-06-17 MED ORDER — VALACYCLOVIR HCL 1 G PO TABS
500.0000 mg | ORAL_TABLET | Freq: Two times a day (BID) | ORAL | Status: DC
Start: 2012-06-17 — End: 2012-06-30

## 2012-06-17 MED ORDER — ONDANSETRON 8 MG PO TBDP: 8.0000 mg | ORAL_TABLET | Freq: Three times a day (TID) | ORAL | Status: DC | PRN

## 2012-06-17 NOTE — Progress Notes (Signed)
Nausea.  Zofran Rx.

## 2012-06-17 NOTE — Progress Notes (Signed)
Pulse-80 Pt c/o lower back pain x 2 weeks. 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

## 2012-06-17 NOTE — Addendum Note (Signed)
Addended by: Julaine Hua on: 06/17/2012 04:11 PM   Modules accepted: Orders

## 2012-06-18 LAB — PAP IG W/ RFLX HPV ASCU

## 2012-06-18 LAB — OBSTETRIC PANEL
Basophils Absolute: 0 10*3/uL (ref 0.0–0.1)
HCT: 31.9 % — ABNORMAL LOW (ref 36.0–46.0)
Lymphocytes Relative: 20 % (ref 12–46)
Monocytes Absolute: 0.4 10*3/uL (ref 0.1–1.0)
Neutro Abs: 4.8 10*3/uL (ref 1.7–7.7)
Platelets: 231 10*3/uL (ref 150–400)
RDW: 13.8 % (ref 11.5–15.5)
Rubella: 3.71 Index — ABNORMAL HIGH (ref <0.90)
WBC: 6.9 10*3/uL (ref 4.0–10.5)

## 2012-06-18 LAB — GC/CHLAMYDIA PROBE AMP: GC Probe RNA: NEGATIVE

## 2012-06-18 LAB — HIV ANTIBODY (ROUTINE TESTING W REFLEX): HIV: NONREACTIVE

## 2012-06-18 LAB — WET PREP BY MOLECULAR PROBE
Candida species: POSITIVE — AB
Trichomonas vaginosis: NEGATIVE

## 2012-06-19 ENCOUNTER — Other Ambulatory Visit: Payer: Self-pay | Admitting: Obstetrics

## 2012-06-19 DIAGNOSIS — Z3689 Encounter for other specified antenatal screening: Secondary | ICD-10-CM

## 2012-06-19 DIAGNOSIS — O09522 Supervision of elderly multigravida, second trimester: Secondary | ICD-10-CM

## 2012-06-23 LAB — AFP, QUAD SCREEN
Curr Gest Age: 16.6 wks.days
INH: 173.8 pg/mL
Interpretation-AFP: POSITIVE — AB
MoM for hCG: 1.76
Osb Risk: <1:27300 {titer}
Tri 18 Scr Risk Est: NEGATIVE
uE3 Mom: 0.33
uE3 Value: 0.2 ng/mL

## 2012-06-25 ENCOUNTER — Ambulatory Visit (HOSPITAL_COMMUNITY)
Admission: RE | Admit: 2012-06-25 | Discharge: 2012-06-25 | Disposition: A | Discharge status: Home / Self Care | Payer: Medicaid Other | Source: Ambulatory Visit | Attending: Obstetrics | Admitting: Obstetrics

## 2012-06-25 ENCOUNTER — Encounter: Payer: Self-pay | Admitting: Obstetrics

## 2012-06-25 DIAGNOSIS — O99330 Smoking (tobacco) complicating pregnancy, unspecified trimester: Secondary | ICD-10-CM | POA: Insufficient documentation

## 2012-06-25 DIAGNOSIS — O99340 Other mental disorders complicating pregnancy, unspecified trimester: Secondary | ICD-10-CM | POA: Insufficient documentation

## 2012-06-25 DIAGNOSIS — F1910 Other psychoactive substance abuse, uncomplicated: Secondary | ICD-10-CM | POA: Insufficient documentation

## 2012-06-25 DIAGNOSIS — O09899 Supervision of other high risk pregnancies, unspecified trimester: Secondary | ICD-10-CM | POA: Insufficient documentation

## 2012-06-25 DIAGNOSIS — Z363 Encounter for antenatal screening for malformations: Secondary | ICD-10-CM | POA: Insufficient documentation

## 2012-06-25 DIAGNOSIS — Z1389 Encounter for screening for other disorder: Secondary | ICD-10-CM | POA: Insufficient documentation

## 2012-06-25 DIAGNOSIS — O09529 Supervision of elderly multigravida, unspecified trimester: Secondary | ICD-10-CM | POA: Insufficient documentation

## 2012-06-25 DIAGNOSIS — Z3689 Encounter for other specified antenatal screening: Secondary | ICD-10-CM

## 2012-06-25 DIAGNOSIS — O09522 Supervision of elderly multigravida, second trimester: Secondary | ICD-10-CM

## 2012-06-25 DIAGNOSIS — O358XX0 Maternal care for other (suspected) fetal abnormality and damage, not applicable or unspecified: Secondary | ICD-10-CM | POA: Insufficient documentation

## 2012-06-25 DIAGNOSIS — O289 Unspecified abnormal findings on antenatal screening of mother: Secondary | ICD-10-CM | POA: Insufficient documentation

## 2012-06-26 ENCOUNTER — Encounter: Payer: Self-pay | Admitting: Obstetrics

## 2012-06-26 DIAGNOSIS — O280 Abnormal hematological finding on antenatal screening of mother: Secondary | ICD-10-CM | POA: Insufficient documentation

## 2012-06-26 NOTE — Progress Notes (Signed)
Genetic Counseling  High-Risk Gestation Note  Appointment Date:  06/25/2012 Referred By: Brock Bad, MD Date of Birth:  Jan 25, 1976    Pregnancy History: G4P0030 Estimated Date of Delivery: 11/26/12 Estimated Gestational Age: [redacted]w[redacted]d Attending: Particia Nearing, MD    [name] was seen for genetic counseling regarding a maternal age of 37 y.o. and an increased risk for Down syndrome based on Quad screen performed through [employer]. The patient was accompanied by her mother to today's visit.   They were counseled regarding maternal age and the association with risk for chromosome conditions due to nondisjunction with aging of the ova.   We reviewed chromosomes, nondisjunction, and the associated 1 in 3 risk for fetal aneuploidy at [redacted]w[redacted]d gestation related to a maternal age of 37 y.o. at delivery.  They were counseled that the risk for aneuploidy decreases as gestational age increases, accounting for those pregnancies which spontaneously abort.  We specifically discussed Down syndrome (trisomy 34), trisomies 42 and 48, and sex chromosome aneuploidies (47,XXX and 47,XXY) including the common features and prognoses of each.   We also reviewed [name]'s maternal serum Quad screen result and the associated increase in risk for fetal Down syndrome (1 in 210 to 1 in 65).  She was counseled regarding other explanations for a screen positive result including normal variation and differences in maternal metabolism.  In addition, we reviewed the screen negative risks for trisomy 18 (1 in 310) and ONTDs (<1 in 27,300).  She understands that Quad screening provides a pregnancy specific risk for Down syndrome, but is not considered to be diagnostic.    We reviewed other available screening options including noninvasive prenatal screening (NIPS) and detailed ultrasound.  Specifically, we discussed that NIPS analyzes cell free fetal DNA found in the maternal circulation. This test  is not diagnostic for chromosome conditions, but can provide information regarding the presence or absence of extra fetal DNA for chromosomes 13, 18, 21, X, and Y, and missing fetal DNA for chromosome X and Y (Turner syndrome). Thus, it would not identify or rule out all genetic conditions. The reported detection rate is greater than 99% for Trisomy 21, greater than 98% for Trisomy 18, and is approximately 80% (8 out of 10) for Trisomy 13. The false positive rate is reported to be less than 0.1% for any of these conditions.  In addition, we discussed that ~50-80% of fetuses with Down syndrome and up to 90-95% of fetuses with trisomy 18/13, when well visualized, have detectable anomalies or soft markers by detailed ultrasound (~18+ weeks gestation).   [name] was also counseled regarding diagnostic testing via amniocentesis.  We reviewed the approximate 1 in 300-500 risk for complications, including spontaneous pregnancy loss. After consideration of all the options, she elected to proceed with targeted ultrasound only today and declined NIPS and amniocentesis. She planned to further consider the options of NIPS and amniocentesis and contact our office, if she elects to pursue either of these options in the pregnancy.  A complete ultrasound was performed today.  The ultrasound report will be sent under separate cover.  [name] was provided with written information regarding cystic fibrosis (CF) including the carrier frequency and incidence in the Caucasian and African American populations, the availability of carrier testing and prenatal diagnosis if indicated.  In addition, we discussed that CF is routinely screened for as part of the [facility] newborn screening panel.  She declined CF testing today.   We also discussed sickle cell  anemia (SCA) including the carrier frequency and incidence in the African-American population and lower carrier frequency in the Caucasian population, the availability of  carrier testing and prenatal diagnosis if indicated.  In addition, we discussed that hemoglobinopathies are routinely screened for as part of the [place] newborn screening panel.    Both family histories were reviewed and found to be noncontributory for birth defects, mental retardation, and known genetic conditions. The patient reportedly has a maternal aunt and two maternal first cousins (the maternal aunt's two daughters) with a history of thyroid cancer. Though most cancers are thought to be sporadic or due to environmental factors, some families appear to have a strong predisposition to cancers.  When considering a family history of cancer, we look for common types of cancer in multiple family members occurring at younger than typical ages. We discussed the option of meeting with a cancer genetic counselor to discuss any possible screening or testing options available. If they are concerned about the family history of cancer and would like to learn more about the family's chance for an inherited cancer syndrome, her doctor may refer her or her relatives to [facility] [phone]). [name] was not familiar with the father of the baby's family history.  We, therefore, cannot comment on how his history might contribute to the overall chance for the baby to have a birth defect. Without further information regarding the provided family history, an accurate genetic risk cannot be calculated. Further genetic counseling is warranted if more information is obtained.  [name] denied exposure to environmental toxins. She denied the use of alcohol, tobacco or street drugs during pregnancy. She denied significant viral illnesses during the course of her pregnancy. Her medical and surgical histories were contributory for use of Seroquel, trazodone, and tegretol during the pregnancy. She reported that her physician has decreased her dosage of tegretol.    Before assessing any risk to a  pregnancy, it is important to know that any time a couple conceives, there is an approximate 3-5% chance that the baby will be affected by a birth defect or have mental retardation. For this reason, it is often difficult to determine if a drug is responsible for causing a pregnancy loss or birth defect. Therefore, in many cases, we cannot provide specific information and can only caution about possible associations between a certain exposure and a specific birth defect. We discussed that the critical period for fetal organ development is from the third through the eighth weeks after conception. Exposures during this time might have the potential for disrupting the beginning of the development of the biological systems.  Many factors contribute to what the effects, if any, a prenatal exposure will have on a pregnancy. Drug dosage (the quantity of drug exposed), timing (when during pregnancy did the exposure occur), the number of exposures to a substance, any drug-drug interactions with other substances taken during pregnancy, the genetic make-up of the mother, and the genetic make-up of a fetus all contribute to the consequences of any prenatal exposure(s). We discussed that we are not able to assess all of these factors when assessing the risk of an exposure in pregnancy but can evaluate medical literature for information regarding each exposure individually.   Seroquel (quetiapine) is a dibenzothiazepine antipsychotic agent. Experimental animal study data have not indicated an expected increased risk for birth defects. A small series of cases regarding prenatal use in human pregnancy also have not indicated an increased risk for birth defects. However,  data are limited regarding use of this medication in pregnancy.  Available animal study data and limited human experience have not suggested an expected increased risk for birth defects with use of trazodone in pregnancy. Available study data have indicated that  prenatal exposures to Tegretol (Carbamazepine) appear to be associated with an increased risk for a neural tube defect (NTD), such as spina bifida, above the general population risk of approximately 1 in 500.  Conflicting study data exist on whether or not the prenatal use of Tegretol also increase the chance for additional birth defects or possible developmental delays. Given that the patient reported use of Tegretol during the first trimester, the risk for ONTDs would be increased based on this exposure. Available screening and testing options in the current pregnancy include maternal serum AFP screening (approximately 80% detection of ONTDs), targeted ultrasound in the second trimester (approximately 90% detection of ONTDs, when well visualized) and amniocentesis (greater than 98% detection of ONTDs). We reviewed that [name]'s maternal serum AFP reduced the risk for ONTDs in the current pregnancy.   Even though a limited number of medicines are known to cause birth defects, we cannot say that it is completely safe to use any medicines during pregnancy.  It is also not possible to predict any drug-drug interactions that occur, or how they might affect a pregnancy.  Sometimes the maternal use of medications, dictated by a medical condition, may even be more beneficial to a pregnancy than not taking the medication(s) at all. [name] should not alter her medication without first consulting her physician.   I counseled [name] regarding the above risks and available options.  The approximate face-to-face time with the genetic counselor was 40 minutes.    Quinn Plowman, MS,  Certified Genetic Counselor 06/26/2012

## 2012-06-30 ENCOUNTER — Encounter (HOSPITAL_COMMUNITY): Payer: Self-pay | Admitting: *Deleted

## 2012-06-30 ENCOUNTER — Emergency Department (HOSPITAL_COMMUNITY)
Admission: EM | Admit: 2012-06-30 | Discharge: 2012-06-30 | Disposition: A | Discharge status: Home / Self Care | Payer: Medicaid Other | Attending: Emergency Medicine | Admitting: Emergency Medicine

## 2012-06-30 DIAGNOSIS — F329 Major depressive disorder, single episode, unspecified: Secondary | ICD-10-CM | POA: Insufficient documentation

## 2012-06-30 DIAGNOSIS — E860 Dehydration: Secondary | ICD-10-CM | POA: Insufficient documentation

## 2012-06-30 DIAGNOSIS — O219 Vomiting of pregnancy, unspecified: Secondary | ICD-10-CM | POA: Insufficient documentation

## 2012-06-30 DIAGNOSIS — Z8619 Personal history of other infectious and parasitic diseases: Secondary | ICD-10-CM | POA: Insufficient documentation

## 2012-06-30 DIAGNOSIS — R109 Unspecified abdominal pain: Secondary | ICD-10-CM | POA: Insufficient documentation

## 2012-06-30 DIAGNOSIS — Z79899 Other long term (current) drug therapy: Secondary | ICD-10-CM | POA: Insufficient documentation

## 2012-06-30 DIAGNOSIS — O9989 Other specified diseases and conditions complicating pregnancy, childbirth and the puerperium: Secondary | ICD-10-CM | POA: Insufficient documentation

## 2012-06-30 DIAGNOSIS — R6883 Chills (without fever): Secondary | ICD-10-CM | POA: Insufficient documentation

## 2012-06-30 DIAGNOSIS — F3289 Other specified depressive episodes: Secondary | ICD-10-CM | POA: Insufficient documentation

## 2012-06-30 DIAGNOSIS — R112 Nausea with vomiting, unspecified: Secondary | ICD-10-CM

## 2012-06-30 LAB — URINALYSIS, ROUTINE W REFLEX MICROSCOPIC
Hgb urine dipstick: NEGATIVE
Protein, ur: 100 mg/dL — AB
Urobilinogen, UA: 0.2 mg/dL (ref 0.0–1.0)

## 2012-06-30 LAB — CBC WITH DIFFERENTIAL/PLATELET
Basophils Absolute: 0 10*3/uL (ref 0.0–0.1)
Eosinophils Relative: 0 % (ref 0–5)
Lymphocytes Relative: 5 % — ABNORMAL LOW (ref 12–46)
Lymphs Abs: 0.7 10*3/uL (ref 0.7–4.0)
MCV: 82.4 fL (ref 78.0–100.0)
Neutro Abs: 13.6 10*3/uL — ABNORMAL HIGH (ref 1.7–7.7)
Neutrophils Relative %: 91 % — ABNORMAL HIGH (ref 43–77)
Platelets: 255 10*3/uL (ref 150–400)
RBC: 4.26 MIL/uL (ref 3.87–5.11)
RDW: 12.8 % (ref 11.5–15.5)
WBC: 14.8 10*3/uL — ABNORMAL HIGH (ref 4.0–10.5)

## 2012-06-30 LAB — COMPREHENSIVE METABOLIC PANEL
ALT: 14 U/L (ref 0–35)
AST: 19 U/L (ref 0–37)
Alkaline Phosphatase: 72 U/L (ref 39–117)
CO2: 24 mEq/L (ref 19–32)
Calcium: 9.3 mg/dL (ref 8.4–10.5)
GFR calc non Af Amer: >90 mL/min (ref >90)
Potassium: 3.2 mEq/L — ABNORMAL LOW (ref 3.5–5.1)
Sodium: 133 mEq/L — ABNORMAL LOW (ref 135–145)

## 2012-06-30 LAB — URINE MICROSCOPIC-ADD ON

## 2012-06-30 MED ORDER — QUETIAPINE FUMARATE 25 MG PO TABS
300.0000 mg | ORAL_TABLET | Freq: Once | ORAL | Status: AC
  Administered 2012-06-30: 300 mg via ORAL
  Filled 2012-06-30: qty 4

## 2012-06-30 MED ORDER — ONDANSETRON HCL 4 MG/2ML IJ SOLN
4.0000 mg | Freq: Once | INTRAMUSCULAR | Status: AC
  Administered 2012-06-30: 4 mg via INTRAVENOUS
  Filled 2012-06-30: qty 2

## 2012-06-30 MED ORDER — PROMETHAZINE HCL 25 MG PO TABS: 25.0000 mg | ORAL_TABLET | Freq: Four times a day (QID) | ORAL | Status: DC | PRN

## 2012-06-30 MED ORDER — SODIUM CHLORIDE 0.9 % IV SOLN
1000.0000 mL | Freq: Once | INTRAVENOUS | Status: AC
  Administered 2012-06-30: 1000 mL via INTRAVENOUS

## 2012-06-30 MED ORDER — SODIUM CHLORIDE 0.9 % IV BOLUS (SEPSIS)
1000.0000 mL | Freq: Once | INTRAVENOUS | Status: AC
  Administered 2012-06-30: 1000 mL via INTRAVENOUS

## 2012-06-30 MED ORDER — SODIUM CHLORIDE 0.9 % IV SOLN
1000.0000 mL | INTRAVENOUS | Status: DC
  Administered 2012-06-30: 1000 mL via INTRAVENOUS

## 2012-06-30 MED ORDER — QUETIAPINE FUMARATE 200 MG PO TABS: 200.0000 mg | ORAL_TABLET | Freq: Every day | ORAL | Status: DC

## 2012-06-30 MED ORDER — PROMETHAZINE HCL 25 MG/ML IJ SOLN
25.0000 mg | Freq: Once | INTRAMUSCULAR | Status: AC
  Administered 2012-06-30: 25 mg via INTRAVENOUS
  Filled 2012-06-30: qty 1

## 2012-06-30 MED ORDER — QUETIAPINE FUMARATE 100 MG PO TABS: 300.0000 mg | ORAL_TABLET | Freq: Every day | ORAL | Status: DC

## 2012-06-30 NOTE — ED Provider Notes (Signed)
History     CSN: [id number]  Arrival date & time 06/30/12  1555   First MD Initiated Contact with Patient 06/30/12 1705      Chief Complaint  Patient presents with  . Emesis  . Abdominal Pain     The history is provided by the patient.   patient is a 37 year old G1 P0 at 4 and 2/7 weeks.  She presents with nausea and vomiting without diarrhea.  Chills without documented fever.  No recent sick contacts.  No vaginal bleeding or abdominal pain.  No difficulty urinating.  No back pain.  Her symptoms are mild to moderate in severity.  Nothing worsens or improves her symptoms.  Her vomiting began at around 2 AM this morning.  She's been unable to keep fluids down.  No history of kidney stones.  She also reports that she is out of her seroquel.    Past Medical History  Diagnosis Date  . Depression   . Genital herpes 2000    Past Surgical History  Procedure Laterality Date  . Knee surgery    . Breast enhancement surgery      Family History  Problem Relation Age of Onset  . Alcoholism    . Leukemia    . Cervical cancer    . Bone cancer    . Cirrhosis    . Diabetes Mellitus II    . Depression    . Hypertension    . Thyroid cancer      History  Substance Use Topics  . Smoking status: Never Smoker   . Smokeless tobacco: Never Used  . Alcohol Use: No     Comment: 4 40's beer daily- not currently    OB History   Grav Para Term Preterm Abortions TAB SAB Ect Mult Living   4 0 0 0 3 2 1 0 0 0       Review of Systems  All other systems reviewed and are negative.    Allergies  Penicillins  Home Medications   Current Outpatient Rx  Name  Route  Sig  Dispense  Refill  . carbamazepine (TEGRETOL) 200 MG tablet   Oral   Take 1 tablet (200 mg total) by mouth 3 (three) times daily. For mood stability   90 tablet   0   . gabapentin (NEURONTIN) 300 MG capsule   Oral   Take 1 capsule (300 mg total) by mouth 3 (three) times daily. For anxiety.   90 capsule   0   .  ondansetron (ZOFRAN ODT) 8 MG disintegrating tablet   Oral   Take 1 tablet (8 mg total) by mouth every 8 (eight) hours as needed for nausea.   20 tablet   5   . QUEtiapine (SEROQUEL) 100 MG tablet   Oral   Take 100 mg by mouth 3 (three) times daily. Take one tablet (100mg ) at 8am and noon. Take 3 tablets by mouth at bedtime. For mood stability and agitation.         . QUEtiapine (SEROQUEL) 200 MG tablet   Oral   Take 200 mg by mouth at bedtime.         . traZODone (DESYREL) 100 MG tablet   Oral   Take 200 mg by mouth at bedtime.         . valACYclovir (VALTREX) 1000 MG tablet   Oral   Take 1 tablet (1,000 mg total) by mouth daily.   30 tablet   11  Suppression during pregnancy.     BP 130/101  Pulse 92  Temp(Src) 98.1 F (36.7 C) (Oral)  Resp 16  SpO2 100%  LMP 02/20/2012  Physical Exam  Nursing note and vitals reviewed. Constitutional: She is oriented to person, place, and time. She appears well-developed and well-nourished. No distress.  HENT:  Head: Normocephalic and atraumatic.  Eyes: EOM are normal.  Neck: Normal range of motion.  Cardiovascular: Normal rate, regular rhythm and normal heart sounds.   Pulmonary/Chest: Effort normal and breath sounds normal.  Abdominal: Soft. She exhibits no distension. There is no tenderness.  Musculoskeletal: Normal range of motion.  Neurological: She is alert and oriented to person, place, and time.  Skin: Skin is warm and dry.  Psychiatric: She has a normal mood and affect. Judgment normal.    ED Course  Procedures (including critical care time)  Labs Reviewed  CBC WITH DIFFERENTIAL - Abnormal; Notable for the following:    WBC 14.8 (*)    HCT 35.1 (*)    MCHC 36.8 (*)    Neutrophils Relative 91 (*)    Neutro Abs 13.6 (*)    Lymphocytes Relative 5 (*)    All other components within normal limits  COMPREHENSIVE METABOLIC PANEL - Abnormal; Notable for the following:    Sodium 133 (*)    Potassium 3.2 (*)     Glucose, Bld 100 (*)    All other components within normal limits  URINALYSIS, ROUTINE W REFLEX MICROSCOPIC - Abnormal; Notable for the following:    Color, Urine AMBER (*)    APPearance CLOUDY (*)    Specific Gravity, Urine 1.035 (*)    Ketones, ur >80 (*)    Protein, ur 100 (*)    Leukocytes, UA TRACE (*)    All other components within normal limits  URINE MICROSCOPIC-ADD ON - Abnormal; Notable for the following:    Squamous Epithelial / LPF MANY (*)    Bacteria, UA MANY (*)    All other components within normal limits  LIPASE, BLOOD  URINALYSIS, MICROSCOPIC ONLY   No results found.   1. Nausea & vomiting   2. Dehydration       MDM  9:46 PM The patient feels much better at this time.  She's keeping oral fluids down.  She continues to feel the baby move.  Discharge home in good condition.  This appears to be a viral syndrome.  Abdominal exam is benign.  No vaginal complaints including no loss of fluid or vaginal bleeding.  Urine culture will be sent.  No white blood cells in her urine.  Appears be contaminated specimen.        Lyanne Co, MD 06/30/12 2149

## 2012-06-30 NOTE — ED Notes (Signed)
Pt, [redacted] weeks pregnant, with emesis since 2 am this morning and lower abd pain and difficulty urinating since 8 am.

## 2012-06-30 NOTE — ED Notes (Signed)
EDD 11-22-12. Denies vaginal bleeding or discharge.

## 2012-07-15 ENCOUNTER — Ambulatory Visit: Payer: Medicaid Other | Admitting: Obstetrics

## 2012-08-04 ENCOUNTER — Ambulatory Visit (INDEPENDENT_AMBULATORY_CARE_PROVIDER_SITE_OTHER): Payer: Medicaid Other | Admitting: Obstetrics

## 2012-08-04 ENCOUNTER — Inpatient Hospital Stay (HOSPITAL_COMMUNITY)
Admission: AD | Admit: 2012-08-04 | Discharge: 2012-08-04 | Disposition: A | Discharge status: Home / Self Care | Payer: Medicaid Other | Source: Ambulatory Visit | Attending: Obstetrics | Admitting: Obstetrics

## 2012-08-04 ENCOUNTER — Encounter (HOSPITAL_COMMUNITY): Payer: Self-pay | Admitting: *Deleted

## 2012-08-04 VITALS — BP 126/87 | Temp 98.2°F | Wt 172.2 lb

## 2012-08-04 DIAGNOSIS — O99340 Other mental disorders complicating pregnancy, unspecified trimester: Secondary | ICD-10-CM | POA: Insufficient documentation

## 2012-08-04 DIAGNOSIS — F3289 Other specified depressive episodes: Secondary | ICD-10-CM | POA: Insufficient documentation

## 2012-08-04 DIAGNOSIS — O212 Late vomiting of pregnancy: Secondary | ICD-10-CM | POA: Insufficient documentation

## 2012-08-04 DIAGNOSIS — F329 Major depressive disorder, single episode, unspecified: Secondary | ICD-10-CM | POA: Insufficient documentation

## 2012-08-04 DIAGNOSIS — O0990 Supervision of high risk pregnancy, unspecified, unspecified trimester: Secondary | ICD-10-CM

## 2012-08-04 DIAGNOSIS — O211 Hyperemesis gravidarum with metabolic disturbance: Secondary | ICD-10-CM

## 2012-08-04 DIAGNOSIS — O210 Mild hyperemesis gravidarum: Secondary | ICD-10-CM

## 2012-08-04 DIAGNOSIS — Z3402 Encounter for supervision of normal first pregnancy, second trimester: Secondary | ICD-10-CM

## 2012-08-04 HISTORY — DX: Unspecified ovarian cyst, unspecified side: N83.209

## 2012-08-04 HISTORY — DX: Reserved for concepts with insufficient information to code with codable children: IMO0002

## 2012-08-04 HISTORY — DX: Unspecified abnormal cytological findings in specimens from cervix uteri: R87.619

## 2012-08-04 HISTORY — DX: Unspecified infectious disease: B99.9

## 2012-08-04 LAB — URINALYSIS, ROUTINE W REFLEX MICROSCOPIC
Hgb urine dipstick: NEGATIVE
Nitrite: NEGATIVE
Specific Gravity, Urine: 1.03 (ref 1.005–1.030)
Urobilinogen, UA: 0.2 mg/dL (ref 0.0–1.0)

## 2012-08-04 LAB — POCT URINALYSIS DIPSTICK
Bilirubin, UA: NEGATIVE
Blood, UA: NEGATIVE
Glucose, UA: NEGATIVE
Nitrite, UA: NEGATIVE

## 2012-08-04 LAB — CBC
MCV: 87.9 fL (ref 78.0–100.0)
Platelets: 193 10*3/uL (ref 150–400)
RBC: 3.38 MIL/uL — ABNORMAL LOW (ref 3.87–5.11)
RDW: 13.1 % (ref 11.5–15.5)
WBC: 10.6 10*3/uL — ABNORMAL HIGH (ref 4.0–10.5)

## 2012-08-04 LAB — COMPREHENSIVE METABOLIC PANEL
ALT: 14 U/L (ref 0–35)
AST: 13 U/L (ref 0–37)
Albumin: 2.8 g/dL — ABNORMAL LOW (ref 3.5–5.2)
CO2: 26 mEq/L (ref 19–32)
Chloride: 100 mEq/L (ref 96–112)
GFR calc non Af Amer: >90 mL/min (ref >90)
Potassium: 3.3 mEq/L — ABNORMAL LOW (ref 3.5–5.1)
Sodium: 133 mEq/L — ABNORMAL LOW (ref 135–145)
Total Bilirubin: 0.2 mg/dL — ABNORMAL LOW (ref 0.3–1.2)

## 2012-08-04 MED ORDER — QUETIAPINE FUMARATE 100 MG PO TABS
100.0000 mg | ORAL_TABLET | Freq: Once | ORAL | Status: AC
  Administered 2012-08-04: 100 mg via ORAL
  Filled 2012-08-04: qty 1

## 2012-08-04 MED ORDER — PROMETHAZINE HCL 25 MG PO TABS: 25.0000 mg | ORAL_TABLET | Freq: Four times a day (QID) | ORAL | Status: DC | PRN

## 2012-08-04 MED ORDER — DEXTROSE IN LACTATED RINGERS 5 % IV SOLN
Freq: Once | INTRAVENOUS | Status: AC
  Administered 2012-08-04: 11:00:00 via INTRAVENOUS

## 2012-08-04 MED ORDER — PROMETHAZINE HCL 25 MG/ML IJ SOLN
25.0000 mg | Freq: Once | INTRAVENOUS | Status: AC
  Administered 2012-08-04: 25 mg via INTRAVENOUS
  Filled 2012-08-04: qty 1

## 2012-08-04 NOTE — Progress Notes (Signed)
Crackers and PB to pt per request

## 2012-08-04 NOTE — MAU Note (Signed)
Ongoing problem with hyperemesis.  Has been taking zofran at home, not working.  Sent from office for fluids.

## 2012-08-04 NOTE — Progress Notes (Signed)
Written and verbal d/c instructions given and understanding voiced. 

## 2012-08-04 NOTE — MAU Provider Note (Signed)
History     CSN: [id number]  Arrival date and time: 08/04/12 8657   First Provider Initiated Contact with Patient 08/04/12 1038      Chief Complaint  Patient presents with  . Emesis During Pregnancy   HPI  Pt is [redacted]w[redacted]d pregnant who presents with nausea and vomiting in pregnancy.  Dr. Clearance Coots called in orders.  Pt has hx of depression and is on Seroquel and has not had dose in 4 days - prescription is out- is feeling side affects of lack of med- with headache, etc pt thinks due to decrease in seratonin/dopamine antagonist.  Pt denies abd pain.  Past Medical History  Diagnosis Date  . Depression   . Genital herpes 2000  . Infection     UTI  . Ovarian cyst   . Abnormal Pap smear     Past Surgical History  Procedure Laterality Date  . Knee surgery    . Breast enhancement surgery      Family History  Problem Relation Age of Onset  . Alcoholism    . Leukemia    . Cervical cancer    . Bone cancer    . Cirrhosis    . Diabetes Mellitus II    . Depression    . Hypertension    . Thyroid cancer      History  Substance Use Topics  . Smoking status: Never Smoker   . Smokeless tobacco: Never Used  . Alcohol Use: No     Comment: 4 40's beer daily- not currently    Allergies:  Allergies  Allergen Reactions  . Penicillins Rash    Prescriptions prior to admission  Medication Sig Dispense Refill  . buprenorphine-naloxone (SUBOXONE) 8-2 MG SUBL Place 2 tablets under the tongue daily.      . Prenatal Vit-Fe Fumarate-FA (PRENATAL MULTIVITAMIN) TABS Take 1 tablet by mouth daily at 12 noon.      . QUEtiapine (SEROQUEL) 100 MG tablet Take 500 mg by mouth at bedtime.      . [DISCONTINUED] QUEtiapine (SEROQUEL) 100 MG tablet Take 3 tablets (300 mg total) by mouth at bedtime.  15 tablet  0  . valACYclovir (VALTREX) 1000 MG tablet Take 1 tablet (1,000 mg total) by mouth daily.  30 tablet  11    ROS Physical Exam   Blood pressure 125/82, pulse 87, temperature 97.5 F (36.4  C), temperature source Oral, resp. rate 18, height 5\' 7"  (1.702 m), weight 77.565 kg (171 lb), last menstrual period 02/20/2012.  Physical Exam  Nursing note and vitals reviewed. Constitutional: She is oriented to person, place, and time. She appears well-developed and well-nourished. No distress.  HENT:  Head: Normocephalic.  Eyes: Pupils are equal, round, and reactive to light.  Neck: Normal range of motion. Neck supple.  Cardiovascular: Normal rate.   Respiratory: Effort normal.  GI: Soft.  Musculoskeletal: Normal range of motion.  Neurological: She is alert and oriented to person, place, and time.  Skin: Skin is warm and dry.  Psychiatric: She has a normal mood and affect.    MAU Course  Procedures  Results for orders placed in visit on 08/04/12 (from the past 24 hour(s))  POCT URINALYSIS DIPSTICK     Status: None   Collection Time    08/04/12  9:39 AM      Result Value Range   Color, UA YELLOW     Clarity, UA CLOUDY     Glucose, UA NEGATIVE     Bilirubin, UA NEGATIVE  Ketones, UA TRACE     Spec Grav, UA 1.010     Blood, UA NEGATIVE     pH, UA >=9.0     Protein, UA NEGATIVE     Urobilinogen, UA negative     Nitrite, UA NEGATIVE     Leukocytes, UA moderate (2+)     Pt tolerating PO fluids and peanut butter and crackers- feeling much better after IV hydration and antiemetics  Assessment and Plan  Hyperemesis- rx for phenergan; diet for hyperemesis Bipolar- f/u with psychiatrist for Seroquel  Emmons Toth 08/04/2012, 10:41 AM

## 2012-08-04 NOTE — Progress Notes (Signed)
Pulse: 99 

## 2012-08-05 ENCOUNTER — Encounter: Payer: Self-pay | Admitting: Obstetrics

## 2012-08-05 LAB — URINE CULTURE

## 2012-08-13 ENCOUNTER — Encounter: Payer: Medicaid Other | Admitting: Obstetrics

## 2012-08-21 ENCOUNTER — Ambulatory Visit (INDEPENDENT_AMBULATORY_CARE_PROVIDER_SITE_OTHER): Payer: Medicaid Other | Admitting: Obstetrics

## 2012-08-21 VITALS — BP 115/76 | Temp 98.3°F | Wt 178.4 lb

## 2012-08-21 DIAGNOSIS — Z3483 Encounter for supervision of other normal pregnancy, third trimester: Secondary | ICD-10-CM

## 2012-08-21 DIAGNOSIS — Z3400 Encounter for supervision of normal first pregnancy, unspecified trimester: Secondary | ICD-10-CM

## 2012-08-21 DIAGNOSIS — Z3402 Encounter for supervision of normal first pregnancy, second trimester: Secondary | ICD-10-CM

## 2012-08-21 LAB — POCT URINALYSIS DIPSTICK
Bilirubin, UA: NEGATIVE
Blood, UA: NEGATIVE
Glucose, UA: NEGATIVE
Nitrite, UA: NEGATIVE
Spec Grav, UA: <=1.005
Urobilinogen, UA: NEGATIVE
pH, UA: 8

## 2012-08-21 NOTE — Progress Notes (Signed)
Pulse: 103

## 2012-09-07 ENCOUNTER — Encounter: Payer: Medicaid Other | Admitting: Obstetrics

## 2012-09-07 ENCOUNTER — Encounter: Payer: Self-pay | Admitting: Obstetrics

## 2012-09-07 ENCOUNTER — Ambulatory Visit (INDEPENDENT_AMBULATORY_CARE_PROVIDER_SITE_OTHER): Payer: Medicaid Other | Admitting: Obstetrics

## 2012-09-07 VITALS — BP 131/83 | Temp 98.0°F | Wt 185.0 lb

## 2012-09-07 DIAGNOSIS — O3663X1 Maternal care for excessive fetal growth, third trimester, fetus 1: Secondary | ICD-10-CM

## 2012-09-07 DIAGNOSIS — Z3403 Encounter for supervision of normal first pregnancy, third trimester: Secondary | ICD-10-CM

## 2012-09-07 DIAGNOSIS — O3660X0 Maternal care for excessive fetal growth, unspecified trimester, not applicable or unspecified: Secondary | ICD-10-CM

## 2012-09-07 DIAGNOSIS — Z3400 Encounter for supervision of normal first pregnancy, unspecified trimester: Secondary | ICD-10-CM

## 2012-09-07 LAB — POCT URINALYSIS DIPSTICK
Blood, UA: NEGATIVE
Glucose, UA: NEGATIVE
Ketones, UA: NEGATIVE
Spec Grav, UA: <=1.005
Urobilinogen, UA: NEGATIVE

## 2012-09-07 NOTE — Progress Notes (Signed)
Pulse- 105 Pt states she is having pain in her lower back and the top of her abdomen.

## 2012-09-08 ENCOUNTER — Ambulatory Visit (INDEPENDENT_AMBULATORY_CARE_PROVIDER_SITE_OTHER): Payer: Medicaid Other

## 2012-09-08 DIAGNOSIS — O3663X1 Maternal care for excessive fetal growth, third trimester, fetus 1: Secondary | ICD-10-CM

## 2012-09-08 DIAGNOSIS — Z3400 Encounter for supervision of normal first pregnancy, unspecified trimester: Secondary | ICD-10-CM

## 2012-09-08 LAB — US OB DETAIL + 14 WK

## 2012-09-09 ENCOUNTER — Encounter: Payer: Self-pay | Admitting: Obstetrics & Gynecology

## 2012-09-09 LAB — US OB DETAIL + 14 WK

## 2012-09-13 ENCOUNTER — Emergency Department (HOSPITAL_COMMUNITY)
Admission: EM | Admit: 2012-09-13 | Discharge: 2012-09-13 | Disposition: A | Discharge status: Home / Self Care | Payer: Medicaid Other

## 2012-09-13 NOTE — ED Notes (Signed)
Pt did not answer x 3 

## 2012-09-14 NOTE — Progress Notes (Signed)
This encounter was created in error - please disregard.

## 2012-09-15 ENCOUNTER — Emergency Department (HOSPITAL_COMMUNITY)
Admission: EM | Admit: 2012-09-15 | Discharge: 2012-09-15 | Disposition: A | Discharge status: Home / Self Care | Payer: Medicaid Other | Attending: Emergency Medicine | Admitting: Emergency Medicine

## 2012-09-15 ENCOUNTER — Emergency Department (HOSPITAL_COMMUNITY): Payer: Medicaid Other

## 2012-09-15 ENCOUNTER — Encounter (HOSPITAL_COMMUNITY): Payer: Self-pay | Admitting: Emergency Medicine

## 2012-09-15 DIAGNOSIS — Z880 Allergy status to penicillin: Secondary | ICD-10-CM | POA: Insufficient documentation

## 2012-09-15 DIAGNOSIS — Z3490 Encounter for supervision of normal pregnancy, unspecified, unspecified trimester: Secondary | ICD-10-CM

## 2012-09-15 DIAGNOSIS — O09519 Supervision of elderly primigravida, unspecified trimester: Secondary | ICD-10-CM | POA: Insufficient documentation

## 2012-09-15 DIAGNOSIS — R799 Abnormal finding of blood chemistry, unspecified: Secondary | ICD-10-CM | POA: Insufficient documentation

## 2012-09-15 DIAGNOSIS — F3289 Other specified depressive episodes: Secondary | ICD-10-CM | POA: Insufficient documentation

## 2012-09-15 DIAGNOSIS — Z79899 Other long term (current) drug therapy: Secondary | ICD-10-CM | POA: Insufficient documentation

## 2012-09-15 DIAGNOSIS — R0602 Shortness of breath: Secondary | ICD-10-CM | POA: Insufficient documentation

## 2012-09-15 DIAGNOSIS — R079 Chest pain, unspecified: Secondary | ICD-10-CM

## 2012-09-15 DIAGNOSIS — O99340 Other mental disorders complicating pregnancy, unspecified trimester: Secondary | ICD-10-CM | POA: Insufficient documentation

## 2012-09-15 DIAGNOSIS — F329 Major depressive disorder, single episode, unspecified: Secondary | ICD-10-CM | POA: Insufficient documentation

## 2012-09-15 DIAGNOSIS — O280 Abnormal hematological finding on antenatal screening of mother: Secondary | ICD-10-CM

## 2012-09-15 DIAGNOSIS — O09512 Supervision of elderly primigravida, second trimester: Secondary | ICD-10-CM

## 2012-09-15 DIAGNOSIS — R072 Precordial pain: Secondary | ICD-10-CM | POA: Insufficient documentation

## 2012-09-15 DIAGNOSIS — R918 Other nonspecific abnormal finding of lung field: Secondary | ICD-10-CM

## 2012-09-15 DIAGNOSIS — O9989 Other specified diseases and conditions complicating pregnancy, childbirth and the puerperium: Secondary | ICD-10-CM | POA: Insufficient documentation

## 2012-09-15 DIAGNOSIS — O219 Vomiting of pregnancy, unspecified: Secondary | ICD-10-CM | POA: Insufficient documentation

## 2012-09-15 DIAGNOSIS — Z87440 Personal history of urinary (tract) infections: Secondary | ICD-10-CM | POA: Insufficient documentation

## 2012-09-15 DIAGNOSIS — R000 Tachycardia, unspecified: Secondary | ICD-10-CM

## 2012-09-15 DIAGNOSIS — R911 Solitary pulmonary nodule: Secondary | ICD-10-CM | POA: Insufficient documentation

## 2012-09-15 LAB — CBC WITH DIFFERENTIAL/PLATELET
Basophils Absolute: 0 10*3/uL (ref 0.0–0.1)
Eosinophils Relative: 1 % (ref 0–5)
Lymphocytes Relative: 14 % (ref 12–46)
MCV: 83.3 fL (ref 78.0–100.0)
Neutrophils Relative %: 80 % — ABNORMAL HIGH (ref 43–77)
Platelets: 161 10*3/uL (ref 150–400)
RDW: 12.7 % (ref 11.5–15.5)
WBC: 8.5 10*3/uL (ref 4.0–10.5)

## 2012-09-15 LAB — BASIC METABOLIC PANEL
CO2: 21 mEq/L (ref 19–32)
Calcium: 8.2 mg/dL — ABNORMAL LOW (ref 8.4–10.5)
GFR calc non Af Amer: >90 mL/min (ref >90)
Sodium: 139 mEq/L (ref 135–145)

## 2012-09-15 LAB — POCT I-STAT TROPONIN I: Troponin i, poc: 0.01 ng/mL (ref 0.00–0.08)

## 2012-09-15 LAB — RAPID URINE DRUG SCREEN, HOSP PERFORMED
Amphetamines: NOT DETECTED
Barbiturates: NOT DETECTED
Benzodiazepines: NOT DETECTED
Tetrahydrocannabinol: NOT DETECTED

## 2012-09-15 MED ORDER — ADENOSINE 6 MG/2ML IV SOLN
12.0000 mg | Freq: Once | INTRAVENOUS | Status: AC
  Administered 2012-09-15: 12 mg via INTRAVENOUS

## 2012-09-15 MED ORDER — ADENOSINE 6 MG/2ML IV SOLN
INTRAVENOUS | Status: AC
  Filled 2012-09-15: qty 6

## 2012-09-15 MED ORDER — ADENOSINE 6 MG/2ML IV SOLN
6.0000 mg | Freq: Once | INTRAVENOUS | Status: AC
  Administered 2012-09-15: 6 mg via INTRAVENOUS

## 2012-09-15 MED ORDER — ADENOSINE 6 MG/2ML IV SOLN
INTRAVENOUS | Status: AC
  Filled 2012-09-15: qty 4

## 2012-09-15 MED ORDER — SODIUM CHLORIDE 0.9 % IV BOLUS (SEPSIS)
1000.0000 mL | Freq: Once | INTRAVENOUS | Status: AC
  Administered 2012-09-15: 1000 mL via INTRAVENOUS

## 2012-09-15 MED ORDER — ADENOSINE 6 MG/2ML IV SOLN
6.0000 mg | Freq: Once | INTRAVENOUS | Status: AC
  Administered 2012-09-15: 12 mg via INTRAVENOUS

## 2012-09-15 MED ORDER — LORAZEPAM 2 MG/ML IJ SOLN
0.5000 mg | Freq: Once | INTRAMUSCULAR | Status: AC
  Administered 2012-09-15: 0.5 mg via INTRAVENOUS
  Filled 2012-09-15: qty 1

## 2012-09-15 MED ORDER — IOHEXOL 350 MG/ML SOLN
100.0000 mL | Freq: Once | INTRAVENOUS | Status: AC | PRN
  Administered 2012-09-15: 100 mL via INTRAVENOUS

## 2012-09-15 NOTE — Progress Notes (Signed)
Dr. Clearance Coots notified of OB RROB encounter with pt at [facility] ED, of FHR and UC's and of plan per ED MD to call Dr. Clearance Coots following CT scan.  Patient OB cleared.

## 2012-09-15 NOTE — ED Notes (Signed)
Report received, assumed care.  

## 2012-09-15 NOTE — ED Provider Notes (Signed)
History    CSN: [id number] Arrival date & time 09/15/12  1443  First MD Initiated Contact with Patient 09/15/12 1500     Chief Complaint  Patient presents with  . Shortness of Breath  . Possible Pregnancy   (Consider location/radiation/quality/duration/timing/severity/associated sxs/prior Treatment) Patient is a 37 y.o. female presenting with chest pain.  Chest Pain Pain location:  Substernal area Pain quality: sharp   Pain radiates to:  Upper back, L shoulder and R shoulder Pain radiates to the back: yes   Onset quality:  Gradual Duration:  3 days Timing:  Constant Progression:  Waxing and waning Chronicity:  New Context: breathing and at rest   Relieved by:  Nothing Worsened by:  Nothing tried Ineffective treatments:  None tried Associated symptoms: nausea, shortness of breath and vomiting   Associated symptoms: no abdominal pain, no back pain, no cough, no dizziness, no fever, no near-syncope, no numbness, no syncope and no weakness    Past Medical History  Diagnosis Date  . Depression   . Genital herpes 2000  . Infection     UTI  . Ovarian cyst   . Abnormal Pap smear    Past Surgical History  Procedure Laterality Date  . Knee surgery    . Breast enhancement surgery     Family History  Problem Relation Age of Onset  . Alcoholism    . Leukemia    . Cervical cancer    . Bone cancer    . Cirrhosis    . Diabetes Mellitus II    . Depression    . Hypertension    . Thyroid cancer     History  Substance Use Topics  . Smoking status: Never Smoker   . Smokeless tobacco: Never Used  . Alcohol Use: No     Comment: 4 40's beer daily- not currently   OB History   Grav Para Term Preterm Abortions TAB SAB Ect Mult Living   4 0 0 0 3 2 1 0 0 0      Review of Systems  Constitutional: Negative for fever and chills.  HENT: Negative for congestion, sore throat and rhinorrhea.   Eyes: Negative for photophobia and visual disturbance.  Respiratory: Positive for  shortness of breath. Negative for cough.   Cardiovascular: Positive for chest pain. Negative for leg swelling, syncope and near-syncope.  Gastrointestinal: Positive for nausea and vomiting. Negative for abdominal pain, diarrhea and constipation.  Endocrine: Negative for polyphagia and polyuria.  Genitourinary: Negative for dysuria, flank pain, vaginal bleeding, vaginal discharge and enuresis.  Musculoskeletal: Negative for back pain and gait problem.  Skin: Negative for color change and rash.  Neurological: Negative for dizziness, syncope, weakness, light-headedness and numbness.  Hematological: Negative for adenopathy. Does not bruise/bleed easily.  All other systems reviewed and are negative.    Allergies  Penicillins  Home Medications   Current Outpatient Rx  Name  Route  Sig  Dispense  Refill  . buprenorphine-naloxone (SUBOXONE) 8-2 MG SUBL   Sublingual   Place 2 tablets under the tongue daily.         . Prenatal Vit-Fe Fumarate-FA (PRENATAL MULTIVITAMIN) TABS   Oral   Take 1 tablet by mouth daily at 12 noon.         . promethazine (PHENERGAN) 25 MG tablet   Oral   Take 1 tablet (25 mg total) by mouth every 6 (six) hours as needed for nausea.   30 tablet   0   . QUEtiapine (SEROQUEL)  100 MG tablet   Oral   Take 500 mg by mouth at bedtime.         . valACYclovir (VALTREX) 1000 MG tablet   Oral   Take 1 tablet (1,000 mg total) by mouth daily.   30 tablet   11     Suppression during pregnancy.    BP 126/76  Pulse 113  Resp 18  SpO2 98%  LMP 02/20/2012 Physical Exam  Vitals reviewed. Constitutional: She is oriented to person, place, and time. She appears well-developed and well-nourished.  HENT:  Head: Normocephalic and atraumatic.  Right Ear: External ear normal.  Left Ear: External ear normal.  Eyes: Conjunctivae and EOM are normal. Pupils are equal, round, and reactive to light.  Neck: Normal range of motion. Neck supple.  Cardiovascular: Regular  rhythm, normal heart sounds and intact distal pulses.  Tachycardia present.   Pulmonary/Chest: Effort normal and breath sounds normal.  Abdominal: Soft. Bowel sounds are normal. There is no tenderness.  Musculoskeletal: Normal range of motion.  Neurological: She is alert and oriented to person, place, and time.  Skin: Skin is warm and dry.    ED Course  Procedures (including critical care time) Labs Reviewed  CBC WITH DIFFERENTIAL - Abnormal; Notable for the following:    RBC 3.48 (*)    Hemoglobin 10.4 (*)    HCT 29.0 (*)    Neutrophils Relative % 80 (*)    All other components within normal limits  URINE RAPID DRUG SCREEN (HOSP PERFORMED)  BASIC METABOLIC PANEL   Dg Chest Portable 1 View  09/15/2012   *RADIOLOGY REPORT*  Clinical Data: Short of breath.  PORTABLE CHEST - 1 VIEW  Comparison: None.  Findings: This pregnant patient was shielded.  Cardiopericardial silhouette within normal limits. Mediastinal contours normal. Trachea midline.  No airspace disease or effusion. Monitoring leads are projected over the chest. No pneumothorax.  IMPRESSION: No active cardiopulmonary disease.   Original Report Authenticated By: Andreas Newport, M.D.   1. Elderly primigravida, antepartum, second trimester   2. Abnormal maternal serum screening test     Date: 09/15/2012  Rate: 174  Rhythm: supraventricular tachycardia (SVT)  QRS Axis: normal  Intervals: normal  ST/T Wave abnormalities: indeterminate  Conduction Disutrbances:none  Narrative Interpretation: SVT  Old EKG Reviewed: none available    Date: 09/15/2012  Rate: 139  Rhythm: sinus tachycardia  QRS Axis: right  Intervals: normal  ST/T Wave abnormalities: normal  Conduction Disutrbances:none  Narrative Interpretation: Sinus tach has replaced SVT  Old EKG Reviewed: changes noted    MDM   37 y.o. female  with pertinent PMH of depression, anxiety, on suboxone presents with pleuritic chest pain and dyspnea x3 days, waxing and  waning.  She is G4P0, at [redacted] wks gestation.  On arrival, pt with tachycardia to 170s, BP 120 systolic (no hypotension throughout my exam, suspect errant data on triage note).  Initial rhythm SVT, pushed adenosine 09-04-10 with slowing of rate to  ~140, apparent sinus rhythm at this time.  Labs and imaging performed as above.  CXR without acute findings.  Given sinus tachycardia and pleuritic chest pain in pt currently pregnant, feel that PE is of significant concern, discussed risks with pt and mother in room, and pt elected to receive ct scan.  NS bolus given.  Pt on FHR monitor, adequate rate, no vaginal bleeding/dc, or other obstetric concerns at this time.    Pt care to Dr. Fayrene Fearing pending final dispo.   Labs and imaging  as above reviewed by myself and attending,Dr. Patria Mane, with whom case was discussed.   1. Elderly primigravida, antepartum, second trimester   2. Abnormal maternal serum screening test        Noel Gerold, MD 09/15/12 1705

## 2012-09-15 NOTE — ED Notes (Signed)
Pt comes to er stating please help me I am 7 months preg pts heart rate 178  bp 68/40 pt pale axious stating I am going to die

## 2012-09-15 NOTE — ED Provider Notes (Signed)
I saw and evaluated the patient, reviewed the resident's note and I agree with the findings and plan. I personally evaluated the ECG and agree with the interpretation of the resident  Patient presents with what appears to be SVT.  Adenosine was given and the patient seemed to improve although she remains tachycardic at around 130.  She's having severe left-sided pleuritic chest pain and given her pregnancy test we are very concerned about the possibility of pulmonary embolism.  The patient will undergo CT angiogram of her chest at this time to evaluate for PE.  By mouth heart rate is in the 150s with good variability.  Her response nursing is at the bedside to assist with tocometry.  The patient's baby does not appear to be any fetal distress at this time.  Bedside echocardiogram will be obtained as well. Care to Dr Fonnie Jarvis  Results for orders placed during the hospital encounter of 09/15/12  URINE RAPID DRUG SCREEN (HOSP PERFORMED)      Result Value Range   Opiates NONE DETECTED  NONE DETECTED   Cocaine NONE DETECTED  NONE DETECTED   Benzodiazepines NONE DETECTED  NONE DETECTED   Amphetamines NONE DETECTED  NONE DETECTED   Tetrahydrocannabinol NONE DETECTED  NONE DETECTED   Barbiturates NONE DETECTED  NONE DETECTED  CBC WITH DIFFERENTIAL      Result Value Range   WBC 8.5  4.0 - 10.5 K/uL   RBC 3.48 (*) 3.87 - 5.11 MIL/uL   Hemoglobin 10.4 (*) 12.0 - 15.0 g/dL   HCT 13.0 (*) 86.5 - 78.4 %   MCV 83.3  78.0 - 100.0 fL   MCH 29.9  26.0 - 34.0 pg   MCHC 35.9  30.0 - 36.0 g/dL   RDW 69.6  29.5 - 28.4 %   Platelets 161  150 - 400 K/uL   Neutrophils Relative % 80 (*) 43 - 77 %   Neutro Abs 6.9  1.7 - 7.7 K/uL   Lymphocytes Relative 14  12 - 46 %   Lymphs Abs 1.2  0.7 - 4.0 K/uL   Monocytes Relative 5  3 - 12 %   Monocytes Absolute 0.5  0.1 - 1.0 K/uL   Eosinophils Relative 1  0 - 5 %   Eosinophils Absolute 0.1  0.0 - 0.7 K/uL   Basophils Relative 0  0 - 1 %   Basophils Absolute 0.0  0.0 -  0.1 K/uL  BASIC METABOLIC PANEL      Result Value Range   Sodium 139  135 - 145 mEq/L   Potassium 3.3 (*) 3.5 - 5.1 mEq/L   Chloride 107  96 - 112 mEq/L   CO2 21  19 - 32 mEq/L   Glucose, Bld 89  70 - 99 mg/dL   BUN 6  6 - 23 mg/dL   Creatinine, Ser 1.32  0.50 - 1.10 mg/dL   Calcium 8.2 (*) 8.4 - 10.5 mg/dL   GFR calc non Af Amer >90  >90 mL/min   GFR calc Af Amer >90  >90 mL/min   Dg Chest Portable 1 View  09/15/2012   *RADIOLOGY REPORT*  Clinical Data: Short of breath.  PORTABLE CHEST - 1 VIEW  Comparison: None.  Findings: This pregnant patient was shielded.  Cardiopericardial silhouette within normal limits. Mediastinal contours normal. Trachea midline.  No airspace disease or effusion. Monitoring leads are projected over the chest. No pneumothorax.  IMPRESSION: No active cardiopulmonary disease.   Original Report Authenticated By: Andreas Newport, M.D.  Lyanne Co, MD 09/15/12 1719

## 2012-09-15 NOTE — Progress Notes (Addendum)
1452 Received call from [facility] ED about this patient presenting at 29.[redacted] wk GA to ED with maternal tachycardia and hypotension. 1506  Arrived to ED RM #34 to find patient with ED MD and RN's, IV NS bolus going, maternal heart rate 140-150.  EFM was previously placed by ED RN's, not captured in OBIX, FHR on my arrival 145.  FHR very similar to maternal rate, fetal movement palpated, pt also reports fetal movement.  FHR sound on monitor consistent with fetal not maternal sound.  Patient denies bleeding or leaking fluids, complains of contractions earlier in the day.  Denies drug use.  Patient complains of SOB and chest pain. Patient has been given multiple doses of adenosine to lower maternal heart rate without success.  O2 10L via facemask placed. 1545  EFM x 30 min, reactive.  ED MD notified.  He states it is OK to d/c monitoring.  Plan for patient is CT to rule out PE.  ED MD to call OB MD once this result is received to decide if patient will be admitted to [facility] or to [facility].

## 2012-09-17 ENCOUNTER — Other Ambulatory Visit: Payer: Self-pay | Admitting: *Deleted

## 2012-09-17 DIAGNOSIS — Z3402 Encounter for supervision of normal first pregnancy, second trimester: Secondary | ICD-10-CM

## 2012-09-22 ENCOUNTER — Ambulatory Visit (INDEPENDENT_AMBULATORY_CARE_PROVIDER_SITE_OTHER): Payer: Medicaid Other

## 2012-09-22 ENCOUNTER — Ambulatory Visit (INDEPENDENT_AMBULATORY_CARE_PROVIDER_SITE_OTHER): Payer: Medicaid Other | Admitting: Obstetrics

## 2012-09-22 VITALS — BP 150/89 | Temp 98.3°F | Wt 185.0 lb

## 2012-09-22 DIAGNOSIS — Z3402 Encounter for supervision of normal first pregnancy, second trimester: Secondary | ICD-10-CM

## 2012-09-22 DIAGNOSIS — Z3400 Encounter for supervision of normal first pregnancy, unspecified trimester: Secondary | ICD-10-CM

## 2012-09-22 DIAGNOSIS — Z3403 Encounter for supervision of normal first pregnancy, third trimester: Secondary | ICD-10-CM

## 2012-09-22 LAB — POCT URINALYSIS DIPSTICK
Ketones, UA: NEGATIVE
Protein, UA: NEGATIVE
Spec Grav, UA: 1.01
pH, UA: 6

## 2012-09-22 NOTE — Progress Notes (Signed)
Pulse- 94 

## 2012-09-23 ENCOUNTER — Encounter: Payer: Self-pay | Admitting: Obstetrics

## 2012-09-23 LAB — US OB DETAIL + 14 WK

## 2012-10-08 ENCOUNTER — Encounter: Payer: Medicaid Other | Admitting: Obstetrics

## 2012-10-09 ENCOUNTER — Encounter (HOSPITAL_COMMUNITY): Payer: Self-pay | Admitting: *Deleted

## 2012-10-09 ENCOUNTER — Inpatient Hospital Stay (HOSPITAL_COMMUNITY)
Admission: AD | Admit: 2012-10-09 | Discharge: 2012-10-10 | Disposition: A | Discharge status: Home / Self Care | Payer: Medicaid Other | Source: Ambulatory Visit | Attending: Emergency Medicine | Admitting: Emergency Medicine

## 2012-10-09 ENCOUNTER — Inpatient Hospital Stay (HOSPITAL_COMMUNITY): Payer: Medicaid Other

## 2012-10-09 ENCOUNTER — Other Ambulatory Visit: Payer: Self-pay

## 2012-10-09 DIAGNOSIS — I498 Other specified cardiac arrhythmias: Secondary | ICD-10-CM | POA: Insufficient documentation

## 2012-10-09 DIAGNOSIS — R0602 Shortness of breath: Secondary | ICD-10-CM | POA: Insufficient documentation

## 2012-10-09 DIAGNOSIS — Z3493 Encounter for supervision of normal pregnancy, unspecified, third trimester: Secondary | ICD-10-CM

## 2012-10-09 DIAGNOSIS — R079 Chest pain, unspecified: Secondary | ICD-10-CM | POA: Insufficient documentation

## 2012-10-09 DIAGNOSIS — R000 Tachycardia, unspecified: Secondary | ICD-10-CM

## 2012-10-09 DIAGNOSIS — O99891 Other specified diseases and conditions complicating pregnancy: Secondary | ICD-10-CM | POA: Insufficient documentation

## 2012-10-09 HISTORY — DX: Supraventricular tachycardia: I47.1

## 2012-10-09 HISTORY — DX: Supraventricular tachycardia, unspecified: I47.10

## 2012-10-09 LAB — CBC WITH DIFFERENTIAL/PLATELET
Lymphocytes Relative: 19 % (ref 12–46)
Lymphs Abs: 1.8 10*3/uL (ref 0.7–4.0)
Neutrophils Relative %: 73 % (ref 43–77)
Platelets: 167 10*3/uL (ref 150–400)
RBC: 3.34 MIL/uL — ABNORMAL LOW (ref 3.87–5.11)
WBC: 9.8 10*3/uL (ref 4.0–10.5)

## 2012-10-09 LAB — POCT I-STAT TROPONIN I: Troponin i, poc: 0 ng/mL (ref 0.00–0.08)

## 2012-10-09 LAB — CG4 I-STAT (LACTIC ACID): Lactic Acid, Venous: 1.28 mmol/L (ref 0.5–2.2)

## 2012-10-09 MED ORDER — SODIUM CHLORIDE 0.9 % IV SOLN
1000.0000 mL | Freq: Once | INTRAVENOUS | Status: AC
  Administered 2012-10-09: 1000 mL via INTRAVENOUS

## 2012-10-09 MED ORDER — SODIUM CHLORIDE 0.9 % IV SOLN
INTRAVENOUS | Status: DC
  Administered 2012-10-09: 22:00:00 via INTRAVENOUS

## 2012-10-09 MED ORDER — SODIUM CHLORIDE 0.9 % IV SOLN
1000.0000 mL | INTRAVENOUS | Status: DC
  Administered 2012-10-09 – 2012-10-10 (×2): 1000 mL via INTRAVENOUS

## 2012-10-09 NOTE — MAU Note (Signed)
Janalyn Shy [facility] at bedside

## 2012-10-09 NOTE — ED Notes (Addendum)
2231  Pt arrives from [facility] with CP and SOB and tachy.  Pt is [redacted] wks pregnant and was cleared at [facility] by DR Clearance Coots stating baby is fine.  Pt is showing a lot of pain with a lot of chest tightness at this time.  Pt is extremely tachy at this time at 145 BMP.  Pt has had SVT in the past and amiodarone worked to convert her.  0030  Pt is still C/O chest tightness.

## 2012-10-09 NOTE — MAU Note (Signed)
EKG done

## 2012-10-09 NOTE — MAU Provider Note (Signed)
History     CSN: [id number]  Arrival date and time: 10/09/12 2105   First Provider Initiated Contact with Patient 10/09/12 2118      Chief Complaint  Patient presents with  . Chest Pain  . Shortness of Breath   Chest Pain  Associated symptoms include shortness of breath.  Shortness of Breath Associated symptoms include chest pain.    Megan Edwards is a 38 y.o. G4P0030 at [redacted]w[redacted]d who presents today with shortness of breath and rapid heart rate. She thought she was having a panic attack all day, but then realized that this was not a panic attack. She was seen on 09/15/12 and treated for SVT with adenosine. She has not had any problems since.   Past Medical History  Diagnosis Date  . Depression   . Genital herpes 2000  . Infection     UTI  . Ovarian cyst   . Abnormal Pap smear   . SVT (supraventricular tachycardia)     Past Surgical History  Procedure Laterality Date  . Knee surgery    . Breast enhancement surgery      Family History  Problem Relation Age of Onset  . Alcoholism    . Leukemia    . Cervical cancer    . Bone cancer    . Cirrhosis    . Diabetes Mellitus II    . Depression    . Hypertension    . Thyroid cancer      History  Substance Use Topics  . Smoking status: Never Smoker   . Smokeless tobacco: Never Used  . Alcohol Use: No     Comment: 4 40's beer daily- not currently    Allergies:  Allergies  Allergen Reactions  . Penicillins Rash    Prescriptions prior to admission  Medication Sig Dispense Refill  . buprenorphine-naloxone (SUBOXONE) 8-2 MG SUBL Place 2 tablets under the tongue daily.      . lansoprazole (PREVACID) 30 MG capsule Take 30 mg by mouth daily.      . Prenatal Vit-Fe Fumarate-FA (PRENATAL MULTIVITAMIN) TABS Take 1 tablet by mouth daily at 12 noon.      . promethazine (PHENERGAN) 25 MG tablet Take 1 tablet (25 mg total) by mouth every 6 (six) hours as needed for nausea.  30 tablet  0  . QUEtiapine (SEROQUEL) 100 MG  tablet Take 100-200 mg by mouth 4 (four) times daily - after meals and at bedtime. Patient takes 100 mg 3 times daily and 200 mg at bedtime.      . traZODone (DESYREL) 100 MG tablet Take 100 mg by mouth at bedtime.      . valACYclovir (VALTREX) 1000 MG tablet Take 1 tablet (1,000 mg total) by mouth daily.  30 tablet  11    Review of Systems  Respiratory: Positive for shortness of breath.   Cardiovascular: Positive for chest pain.   Physical Exam   Blood pressure 95/57, pulse 156, temperature 97 F (36.1 C), temperature source Oral, resp. rate 22, height 5\' 7"  (1.702 m), weight 79.379 kg (175 lb), last menstrual period 02/20/2012, SpO2 97.00%.  Physical Exam  Nursing note and vitals reviewed. Constitutional: She is oriented to person, place, and time. She appears well-developed and well-nourished. She appears distressed.  Cardiovascular:  Elevated heart rate  Respiratory: Breath sounds normal. No respiratory distress.  Only taking shallow breaths   GI: Soft. There is no tenderness.  Neurological: She is alert and oriented to person, place, and time.  Skin:  Skin is warm and dry.  Psychiatric: She has a normal mood and affect.    MAU Course  Procedures  2119: Spoke with Dr. Clearance Coots, he is no comfortable treating this patient here, and would like her transferred to [facility] or [facility] 2120; Spoke with Dr. Loretha Stapler and their telemetry is down, and he would prefer her to go to [facility] 2125: Unable to find the ED MD at this time, was advised to call back in 6-7 mins 2130: Asked Dr. Despina Hidden to also evaluate the patient 2135: Spoke with Dr. Radford Pax at [facility], transfer accepted 2145: Rapid response RN here to see the patient.  2144: Dr. Despina Hidden here to see the patient  2150: [facility] here to transfer patient 2206: Patient leaving the unit with [facility]  Assessment and Plan   1. Tachycardia    Transfer to [facility]  Tawnya Crook 10/09/2012, 9:38 PM

## 2012-10-09 NOTE — ED Provider Notes (Signed)
History    CSN: [id number] Arrival date & time 10/09/12  2105  First MD Initiated Contact with Patient 10/09/12 2302     Chief Complaint  Patient presents with  . Chest Pain  . Shortness of Breath   (Consider location/radiation/quality/duration/timing/severity/associated sxs/prior Treatment) Patient is a 37 y.o. female presenting with chest pain and shortness of breath. The history is provided by the patient.  Chest Pain Associated symptoms: shortness of breath   Shortness of Breath Associated symptoms: chest pain   She had onset this afternoon of a tight feeling in her abdomen and difficulty breathing. This is associated with dyspnea, nausea, diaphoresis. Hasn't continued, and she developed a burning sensation in her chest. Symptoms waxed and waned but she could not identify what made it better and was made it worse. She had a similar episode about one month ago and was in the ED. She is [redacted] weeks pregnant and is gravida 2, para 0 with one miscarriage. There've been no complications of pregnancy other than the noted episode of rapid heartbeat. She had been seen at the [facility] hospital prior to coming here and was told that there were no problems with the baby. Past Medical History  Diagnosis Date  . Depression   . Genital herpes 2000  . Infection     UTI  . Ovarian cyst   . Abnormal Pap smear   . SVT (supraventricular tachycardia)    Past Surgical History  Procedure Laterality Date  . Knee surgery    . Breast enhancement surgery     Family History  Problem Relation Age of Onset  . Alcoholism    . Leukemia    . Cervical cancer    . Bone cancer    . Cirrhosis    . Diabetes Mellitus II    . Depression    . Hypertension    . Thyroid cancer     History  Substance Use Topics  . Smoking status: Never Smoker   . Smokeless tobacco: Never Used  . Alcohol Use: No     Comment: 4 40's beer daily- not currently   OB History   Grav Para Term Preterm Abortions TAB SAB Ect Mult  Living   4 0 0 0 3 2 1 0 0 0      Review of Systems  Respiratory: Positive for shortness of breath.   Cardiovascular: Positive for chest pain.  All other systems reviewed and are negative.    Allergies  Penicillins  Home Medications   Current Outpatient Rx  Name  Route  Sig  Dispense  Refill  . buprenorphine-naloxone (SUBOXONE) 8-2 MG SUBL   Sublingual   Place 2 tablets under the tongue daily.         . lansoprazole (PREVACID) 30 MG capsule   Oral   Take 30 mg by mouth daily.         . Prenatal Vit-Fe Fumarate-FA (PRENATAL MULTIVITAMIN) TABS   Oral   Take 1 tablet by mouth daily at 12 noon.         . promethazine (PHENERGAN) 25 MG tablet   Oral   Take 1 tablet (25 mg total) by mouth every 6 (six) hours as needed for nausea.   30 tablet   0   . QUEtiapine (SEROQUEL) 100 MG tablet   Oral   Take 100-200 mg by mouth 4 (four) times daily - after meals and at bedtime. Patient takes 100 mg 3 times daily and 200 mg at bedtime.         Marland Kitchen  traZODone (DESYREL) 100 MG tablet   Oral   Take 100 mg by mouth at bedtime.         . valACYclovir (VALTREX) 1000 MG tablet   Oral   Take 1 tablet (1,000 mg total) by mouth daily.   30 tablet   11     Suppression during pregnancy.    BP 85/53  Pulse 132  Temp(Src) 97 F (36.1 C) (Oral)  Resp 21  Ht 5\' 7"  (1.702 m)  Wt 175 lb (79.379 kg)  BMI 27.4 kg/m2  SpO2 98%  LMP 02/20/2012 Physical Exam  Nursing note and vitals reviewed.  37 year old female, resting comfortably and in no acute distress. Vital signs are significant for hypertension with blood pressure 85/53, tachycardia with heart rate of 132, and tachypnea with respiratory rate of 21. Oxygen saturation is 98%, which is normal. Head is normocephalic and atraumatic. PERRLA, EOMI. Oropharynx is clear. Neck is nontender and supple without adenopathy or JVD. Back is nontender and there is no CVA tenderness. Lungs are clear without rales, wheezes, or  rhonchi. Chest is nontender. Heart has regular rate and rhythm without murmur. Abdomen is soft, flat, nontender without masses or hepatosplenomegaly and peristalsis is normoactive. Gravid uterus is present at near term height and fetal movement is detected. Extremities have no cyanosis or edema, full range of motion is present. Skin is warm and dry without rash. Neurologic: Mental status is normal, cranial nerves are intact, there are no motor or sensory deficits.  ED Course  Procedures (including critical care time) Results for orders placed during the hospital encounter of 10/09/12  CBC WITH DIFFERENTIAL      Result Value Range   WBC 9.8  4.0 - 10.5 K/uL   RBC 3.34 (*) 3.87 - 5.11 MIL/uL   Hemoglobin 9.5 (*) 12.0 - 15.0 g/dL   HCT 16.1 (*) 09.6 - 04.5 %   MCV 81.1  78.0 - 100.0 fL   MCH 28.4  26.0 - 34.0 pg   MCHC 35.1  30.0 - 36.0 g/dL   RDW 40.9  81.1 - 91.4 %   Platelets 167  150 - 400 K/uL   Neutrophils Relative % 73  43 - 77 %   Neutro Abs 7.1  1.7 - 7.7 K/uL   Lymphocytes Relative 19  12 - 46 %   Lymphs Abs 1.8  0.7 - 4.0 K/uL   Monocytes Relative 8  3 - 12 %   Monocytes Absolute 0.8  0.1 - 1.0 K/uL   Eosinophils Relative 1  0 - 5 %   Eosinophils Absolute 0.1  0.0 - 0.7 K/uL   Basophils Relative 0  0 - 1 %   Basophils Absolute 0.0  0.0 - 0.1 K/uL  BASIC METABOLIC PANEL      Result Value Range   Sodium 139  135 - 145 mEq/L   Potassium 3.7  3.5 - 5.1 mEq/L   Chloride 108  96 - 112 mEq/L   CO2 22  19 - 32 mEq/L   Glucose, Bld 83  70 - 99 mg/dL   BUN 5 (*) 6 - 23 mg/dL   Creatinine, Ser 7.82  0.50 - 1.10 mg/dL   Calcium 8.3 (*) 8.4 - 10.5 mg/dL   GFR calc non Af Amer >90  >90 mL/min   GFR calc Af Amer >90  >90 mL/min  CG4 I-STAT (LACTIC ACID)      Result Value Range   Lactic Acid, Venous 1.28  0.5 -  2.2 mmol/L  POCT I-STAT TROPONIN I      Result Value Range   Troponin i, poc 0.00  0.00 - 0.08 ng/mL   Comment 3            Dg Chest Portable 1 View  10/09/2012    *RADIOLOGY REPORT*  Clinical Data: Chest pain and shortness of breath  PORTABLE CHEST - 1 VIEW  Comparison: 09/15/2012  Findings: The heart, mediastinum and hila are within normal limits. The lungs are clear.  No pleural effusion or pneumothorax.  The bony thorax is intact.  IMPRESSION: Normal AP portable chest radiograph.   Original Report Authenticated By: Amie Portland, M.D.      Date: 10/09/2012  Rate: 154  Rhythm: sinus tachycardia  QRS Axis: normal  Intervals: normal  ST/T Wave abnormalities: normal  Conduction Disutrbances:none  Narrative Interpretation: Sinus tachycardia. When compared with ECG of 09/15/2012, reversal of arm leads has been corrected.  Old EKG Reviewed: changes noted   1. Tachycardia   2. Sinus tachycardia   3. Third trimester pregnancy     MDM  Sinus tachycardia of uncertain cause. Screening labs were obtained. Old records are reviewed and she was seen on June 24 with a SVT which converted to a sinus tachycardia after adenosine. There is a notation that she had been given amiodarone in the past but I cannot find any ED visits she been given amiodarone and patient was not aware of ever being given amiodarone.  In the ED, carotid sinus massage was attempted with little change in heart rate. She was given 2 L of fluid and heart rate came down to 123 but she was still symptomatic with a sense of heart racing and some vague chest discomfort. She was given a dose of metoprolol in heart rate has come down to 107 and she feels like she is back to normal. I discussed with her that metoprolol is category C. for pregnancy and I feel that she needs to discuss with her obstetrician whether she should continue taking at. She is discharged without any prescriptions and is to followup with her obstetrician next week.  Dione Booze, MD 10/10/12 [phone]

## 2012-10-09 NOTE — MAU Note (Signed)
Patient complains of shortness of breath and chest tightness all day. Felt like she was having a panic attack. Was seen in the ED for the same symptoms in June and told she nodes on her lungs.

## 2012-10-09 NOTE — MAU Note (Signed)
Dr. Eure at bedside. 

## 2012-10-09 NOTE — MAU Note (Signed)
Called to see pt hx SVT witrh recent cardioversion. Pt not prescribed beta-blockers,. Pt sitting high Semi-Fowlers's, c/o SOB. HR bounding at rate 170. Pt anxious and diaphoretic. Small ice pavk applied axilla and chest with no change in HR. Heart monitor applied, SVT rate 166.

## 2012-10-10 LAB — BASIC METABOLIC PANEL
CO2: 22 mEq/L (ref 19–32)
Chloride: 108 mEq/L (ref 96–112)
Glucose, Bld: 83 mg/dL (ref 70–99)
Potassium: 3.7 mEq/L (ref 3.5–5.1)
Sodium: 139 mEq/L (ref 135–145)

## 2012-10-10 MED ORDER — SODIUM CHLORIDE 0.9 % IV BOLUS (SEPSIS)
1000.0000 mL | Freq: Once | INTRAVENOUS | Status: AC
  Administered 2012-10-10: 1000 mL via INTRAVENOUS

## 2012-10-10 MED ORDER — METOPROLOL TARTRATE 1 MG/ML IV SOLN
2.5000 mg | Freq: Once | INTRAVENOUS | Status: AC
  Administered 2012-10-10: 2.5 mg via INTRAVENOUS
  Filled 2012-10-10: qty 5

## 2012-10-13 ENCOUNTER — Encounter: Payer: Self-pay | Admitting: Obstetrics & Gynecology

## 2012-10-20 ENCOUNTER — Encounter: Payer: Self-pay | Admitting: Obstetrics & Gynecology

## 2012-10-20 LAB — US OB DETAIL + 14 WK

## 2012-10-22 ENCOUNTER — Encounter: Payer: Self-pay | Admitting: Obstetrics

## 2012-10-22 ENCOUNTER — Ambulatory Visit (INDEPENDENT_AMBULATORY_CARE_PROVIDER_SITE_OTHER): Payer: Medicaid Other | Admitting: Obstetrics

## 2012-10-22 VITALS — BP 126/87 | Temp 98.1°F | Wt 198.0 lb

## 2012-10-22 DIAGNOSIS — Z3403 Encounter for supervision of normal first pregnancy, third trimester: Secondary | ICD-10-CM

## 2012-10-22 DIAGNOSIS — Z3400 Encounter for supervision of normal first pregnancy, unspecified trimester: Secondary | ICD-10-CM

## 2012-10-22 LAB — POCT URINALYSIS DIPSTICK
Spec Grav, UA: 1.015
Urobilinogen, UA: NEGATIVE
pH, UA: 8

## 2012-10-22 LAB — OB RESULTS CONSOLE GBS: GBS: NEGATIVE

## 2012-10-22 NOTE — Progress Notes (Signed)
P-99 Pt states she is having braxton hicks.

## 2012-10-25 LAB — CULTURE, BETA STREP (GROUP B ONLY)

## 2012-11-02 ENCOUNTER — Ambulatory Visit (INDEPENDENT_AMBULATORY_CARE_PROVIDER_SITE_OTHER): Payer: Medicaid Other | Admitting: Obstetrics

## 2012-11-02 VITALS — BP 128/80 | Temp 98.2°F | Wt 197.4 lb

## 2012-11-02 DIAGNOSIS — Z3400 Encounter for supervision of normal first pregnancy, unspecified trimester: Secondary | ICD-10-CM

## 2012-11-02 DIAGNOSIS — N390 Urinary tract infection, site not specified: Secondary | ICD-10-CM

## 2012-11-02 DIAGNOSIS — Z3403 Encounter for supervision of normal first pregnancy, third trimester: Secondary | ICD-10-CM

## 2012-11-02 LAB — POCT URINALYSIS DIPSTICK
Bilirubin, UA: NEGATIVE
Blood, UA: NEGATIVE
Glucose, UA: NEGATIVE
Nitrite, UA: POSITIVE
Spec Grav, UA: 1.015
pH, UA: 7

## 2012-11-02 MED ORDER — NITROFURANTOIN MONOHYD MACRO 100 MG PO CAPS: 100.0000 mg | ORAL_CAPSULE | Freq: Two times a day (BID) | ORAL | Status: DC

## 2012-11-02 NOTE — Progress Notes (Signed)
Pulse- 97. C/O abnormal green vaginal discharge.

## 2012-11-05 LAB — CULTURE, OB URINE

## 2012-11-09 ENCOUNTER — Encounter: Payer: Medicaid Other | Admitting: Obstetrics

## 2012-11-11 ENCOUNTER — Encounter: Payer: Medicaid Other | Admitting: Obstetrics

## 2012-11-13 ENCOUNTER — Ambulatory Visit (INDEPENDENT_AMBULATORY_CARE_PROVIDER_SITE_OTHER): Payer: Medicaid Other | Admitting: Obstetrics

## 2012-11-13 ENCOUNTER — Encounter: Payer: Self-pay | Admitting: Obstetrics

## 2012-11-13 VITALS — BP 136/90 | Temp 98.2°F | Wt 201.0 lb

## 2012-11-13 DIAGNOSIS — Z3403 Encounter for supervision of normal first pregnancy, third trimester: Secondary | ICD-10-CM

## 2012-11-13 DIAGNOSIS — Z3400 Encounter for supervision of normal first pregnancy, unspecified trimester: Secondary | ICD-10-CM

## 2012-11-13 LAB — POCT URINALYSIS DIPSTICK
Glucose, UA: NEGATIVE
Nitrite, UA: NEGATIVE
Spec Grav, UA: <=1.005
Urobilinogen, UA: NEGATIVE

## 2012-11-13 NOTE — Progress Notes (Signed)
Pulse: 108

## 2012-11-16 ENCOUNTER — Ambulatory Visit (INDEPENDENT_AMBULATORY_CARE_PROVIDER_SITE_OTHER): Payer: Medicaid Other | Admitting: Obstetrics

## 2012-11-16 ENCOUNTER — Encounter: Payer: Self-pay | Admitting: Obstetrics

## 2012-11-16 VITALS — BP 125/90 | Temp 97.6°F | Wt 203.0 lb

## 2012-11-16 DIAGNOSIS — Z3480 Encounter for supervision of other normal pregnancy, unspecified trimester: Secondary | ICD-10-CM

## 2012-11-16 DIAGNOSIS — Z3483 Encounter for supervision of other normal pregnancy, third trimester: Secondary | ICD-10-CM

## 2012-11-16 LAB — POCT URINALYSIS DIPSTICK
Nitrite, UA: NEGATIVE
Protein, UA: NEGATIVE
pH, UA: 6

## 2012-11-16 NOTE — Progress Notes (Signed)
P- 111 Pressure in lower abdomen and pain in lower back.

## 2012-11-24 ENCOUNTER — Inpatient Hospital Stay (HOSPITAL_COMMUNITY)
Admission: AD | Admit: 2012-11-24 | Discharge: 2012-11-28 | DRG: 775 | Disposition: A | Discharge status: Home / Self Care | Payer: Medicaid Other | Source: Ambulatory Visit | Attending: Obstetrics | Admitting: Obstetrics

## 2012-11-24 ENCOUNTER — Ambulatory Visit (INDEPENDENT_AMBULATORY_CARE_PROVIDER_SITE_OTHER): Payer: Medicaid Other | Admitting: Obstetrics

## 2012-11-24 ENCOUNTER — Encounter (HOSPITAL_COMMUNITY): Payer: Self-pay | Admitting: *Deleted

## 2012-11-24 VITALS — BP 153/97 | Temp 98.6°F | Wt 205.0 lb

## 2012-11-24 DIAGNOSIS — D649 Anemia, unspecified: Secondary | ICD-10-CM | POA: Diagnosis not present

## 2012-11-24 DIAGNOSIS — O133 Gestational [pregnancy-induced] hypertension without significant proteinuria, third trimester: Secondary | ICD-10-CM

## 2012-11-24 DIAGNOSIS — Z3403 Encounter for supervision of normal first pregnancy, third trimester: Secondary | ICD-10-CM

## 2012-11-24 DIAGNOSIS — O09529 Supervision of elderly multigravida, unspecified trimester: Secondary | ICD-10-CM | POA: Diagnosis present

## 2012-11-24 DIAGNOSIS — Z3400 Encounter for supervision of normal first pregnancy, unspecified trimester: Secondary | ICD-10-CM

## 2012-11-24 DIAGNOSIS — O139 Gestational [pregnancy-induced] hypertension without significant proteinuria, unspecified trimester: Principal | ICD-10-CM | POA: Diagnosis present

## 2012-11-24 DIAGNOSIS — F1120 Opioid dependence, uncomplicated: Secondary | ICD-10-CM | POA: Diagnosis present

## 2012-11-24 DIAGNOSIS — O280 Abnormal hematological finding on antenatal screening of mother: Secondary | ICD-10-CM

## 2012-11-24 DIAGNOSIS — O9903 Anemia complicating the puerperium: Secondary | ICD-10-CM | POA: Diagnosis not present

## 2012-11-24 DIAGNOSIS — O09513 Supervision of elderly primigravida, third trimester: Secondary | ICD-10-CM

## 2012-11-24 DIAGNOSIS — IMO0002 Reserved for concepts with insufficient information to code with codable children: Secondary | ICD-10-CM | POA: Diagnosis not present

## 2012-11-24 LAB — PROTEIN / CREATININE RATIO, URINE
Creatinine, Urine: 250.72 mg/dL
Protein Creatinine Ratio: 0.04 (ref 0.00–0.15)
Total Protein, Urine: 10.1 mg/dL

## 2012-11-24 LAB — POCT URINALYSIS DIPSTICK
Protein, UA: 3
pH, UA: 6

## 2012-11-24 LAB — CBC
HCT: 30.9 % — ABNORMAL LOW (ref 36.0–46.0)
Hemoglobin: 10.5 g/dL — ABNORMAL LOW (ref 12.0–15.0)
Hemoglobin: 10.3 g/dL — ABNORMAL LOW (ref 12.0–15.0)
MCH: 25.7 pg — ABNORMAL LOW (ref 26.0–34.0)
MCH: 25.4 pg — ABNORMAL LOW (ref 26.0–34.0)
MCHC: 33.4 g/dL (ref 30.0–36.0)
MCV: 76.3 fL — ABNORMAL LOW (ref 78.0–100.0)
RBC: 4.05 MIL/uL (ref 3.87–5.11)
RDW: 15.2 % (ref 11.5–15.5)
WBC: 10.1 10*3/uL (ref 4.0–10.5)

## 2012-11-24 LAB — COMPREHENSIVE METABOLIC PANEL
ALT: 15 U/L (ref 0–35)
Albumin: 2.5 g/dL — ABNORMAL LOW (ref 3.5–5.2)
BUN: 6 mg/dL (ref 6–23)
Calcium: 9.4 mg/dL (ref 8.4–10.5)
GFR calc Af Amer: >90 mL/min (ref >90)
Glucose, Bld: 115 mg/dL — ABNORMAL HIGH (ref 70–99)
Potassium: 3.6 mEq/L (ref 3.5–5.1)
Sodium: 134 mEq/L — ABNORMAL LOW (ref 135–145)
Total Protein: 5.7 g/dL — ABNORMAL LOW (ref 6.0–8.3)

## 2012-11-24 LAB — URINALYSIS, ROUTINE W REFLEX MICROSCOPIC
Bilirubin Urine: NEGATIVE
Hgb urine dipstick: NEGATIVE
Nitrite: NEGATIVE
Protein, ur: NEGATIVE mg/dL
Urobilinogen, UA: 0.2 mg/dL (ref 0.0–1.0)

## 2012-11-24 LAB — URIC ACID: Uric Acid, Serum: 3.8 mg/dL (ref 2.4–7.0)

## 2012-11-24 MED ORDER — OXYCODONE-ACETAMINOPHEN 5-325 MG PO TABS: 1.0000 | ORAL_TABLET | ORAL | Status: DC | PRN

## 2012-11-24 MED ORDER — ZOLPIDEM TARTRATE 5 MG PO TABS
5.0000 mg | ORAL_TABLET | Freq: Every evening | ORAL | Status: DC | PRN
  Administered 2012-11-24 – 2012-11-25 (×2): 5 mg via ORAL
  Filled 2012-11-24 (×2): qty 1

## 2012-11-24 MED ORDER — LACTATED RINGERS IV SOLN
INTRAVENOUS | Status: DC
  Administered 2012-11-24 – 2012-11-26 (×4): via INTRAVENOUS

## 2012-11-24 MED ORDER — OXYTOCIN 40 UNITS IN LACTATED RINGERS INFUSION - SIMPLE MED
1.0000 m[IU]/min | INTRAVENOUS | Status: DC
  Administered 2012-11-24: 1 m[IU]/min via INTRAVENOUS
  Administered 2012-11-24: 3 m[IU]/min via INTRAVENOUS
  Administered 2012-11-24: 2 m[IU]/min via INTRAVENOUS
  Administered 2012-11-25: 6 m[IU]/min via INTRAVENOUS
  Administered 2012-11-25: 5 m[IU]/min via INTRAVENOUS
  Administered 2012-11-25: 4 m[IU]/min via INTRAVENOUS
  Filled 2012-11-24: qty 1000

## 2012-11-24 MED ORDER — FENTANYL 2.5 MCG/ML BUPIVACAINE 1/10 % EPIDURAL INFUSION (WH - ANES)
14.0000 mL/h | INTRAMUSCULAR | Status: DC | PRN
  Administered 2012-11-25 – 2012-11-26 (×3): 14 mL/h via EPIDURAL
  Filled 2012-11-24 (×4): qty 125

## 2012-11-24 MED ORDER — ACETAMINOPHEN 325 MG PO TABS
650.0000 mg | ORAL_TABLET | ORAL | Status: DC | PRN
  Administered 2012-11-25 (×3): 650 mg via ORAL
  Filled 2012-11-24 (×3): qty 2

## 2012-11-24 MED ORDER — EPHEDRINE 5 MG/ML INJ
10.0000 mg | INTRAVENOUS | Status: DC | PRN
  Filled 2012-11-24: qty 4
  Filled 2012-11-24: qty 2

## 2012-11-24 MED ORDER — MAGNESIUM SULFATE 40 G IN LACTATED RINGERS - SIMPLE
2.0000 g/h | INTRAVENOUS | Status: DC
  Administered 2012-11-24 – 2012-11-26 (×3): 2 g/h via INTRAVENOUS
  Filled 2012-11-24 (×3): qty 500

## 2012-11-24 MED ORDER — ONDANSETRON HCL 4 MG/2ML IJ SOLN
4.0000 mg | Freq: Four times a day (QID) | INTRAMUSCULAR | Status: DC | PRN
  Administered 2012-11-24 – 2012-11-25 (×2): 4 mg via INTRAVENOUS
  Filled 2012-11-24 (×2): qty 2

## 2012-11-24 MED ORDER — MISOPROSTOL 25 MCG QUARTER TABLET
25.0000 ug | ORAL_TABLET | ORAL | Status: DC
  Administered 2012-11-24: 25 ug via VAGINAL
  Filled 2012-11-24: qty 0.25

## 2012-11-24 MED ORDER — OXYTOCIN BOLUS FROM INFUSION: 500.0000 mL | INTRAVENOUS | Status: DC

## 2012-11-24 MED ORDER — LACTATED RINGERS IV SOLN: 500.0000 mL | INTRAVENOUS | Status: DC | PRN

## 2012-11-24 MED ORDER — PHENYLEPHRINE 40 MCG/ML (10ML) SYRINGE FOR IV PUSH (FOR BLOOD PRESSURE SUPPORT)
80.0000 ug | PREFILLED_SYRINGE | INTRAVENOUS | Status: DC | PRN
  Filled 2012-11-24: qty 5
  Filled 2012-11-24: qty 2

## 2012-11-24 MED ORDER — EPHEDRINE 5 MG/ML INJ
10.0000 mg | INTRAVENOUS | Status: DC | PRN
  Filled 2012-11-24: qty 2

## 2012-11-24 MED ORDER — IBUPROFEN 600 MG PO TABS: 600.0000 mg | ORAL_TABLET | Freq: Four times a day (QID) | ORAL | Status: DC | PRN

## 2012-11-24 MED ORDER — LACTATED RINGERS IV SOLN
500.0000 mL | Freq: Once | INTRAVENOUS | Status: AC
  Administered 2012-11-25: 300 mL via INTRAVENOUS

## 2012-11-24 MED ORDER — CITRIC ACID-SODIUM CITRATE 334-500 MG/5ML PO SOLN
30.0000 mL | ORAL | Status: DC | PRN
  Administered 2012-11-25: 30 mL via ORAL
  Filled 2012-11-24: qty 15

## 2012-11-24 MED ORDER — MAGNESIUM SULFATE BOLUS VIA INFUSION
4.0000 g | Freq: Once | INTRAVENOUS | Status: AC
  Administered 2012-11-24: 4 g via INTRAVENOUS
  Filled 2012-11-24: qty 500

## 2012-11-24 MED ORDER — OXYTOCIN 40 UNITS IN LACTATED RINGERS INFUSION - SIMPLE MED
62.5000 mL/h | INTRAVENOUS | Status: DC
  Administered 2012-11-26: 100 mL/h via INTRAVENOUS
  Filled 2012-11-24: qty 1000

## 2012-11-24 MED ORDER — LIDOCAINE HCL (PF) 1 % IJ SOLN
30.0000 mL | INTRAMUSCULAR | Status: DC | PRN
  Administered 2012-11-26: 30 mL via SUBCUTANEOUS
  Filled 2012-11-24 (×2): qty 30

## 2012-11-24 MED ORDER — PHENYLEPHRINE 40 MCG/ML (10ML) SYRINGE FOR IV PUSH (FOR BLOOD PRESSURE SUPPORT)
80.0000 ug | PREFILLED_SYRINGE | INTRAVENOUS | Status: DC | PRN
  Filled 2012-11-24: qty 2

## 2012-11-24 MED ORDER — DIPHENHYDRAMINE HCL 50 MG/ML IJ SOLN: 12.5000 mg | INTRAMUSCULAR | Status: DC | PRN

## 2012-11-24 MED ORDER — FLEET ENEMA 7-19 GM/118ML RE ENEM: 1.0000 | ENEMA | RECTAL | Status: DC | PRN

## 2012-11-24 NOTE — MAU Note (Signed)
Here for [facility] evaluation sent from office, 3cm today in office with elevated BP

## 2012-11-24 NOTE — MAU Provider Note (Signed)
History     CSN: [id number]  Arrival date and time: 11/24/12 1200   First Provider Initiated Contact with Patient 11/24/12 1455      Chief Complaint  Patient presents with  . Hypertension   HPI  Pt is a G4P0030 here at [redacted]w[redacted]d sent from office with report of elevated blood pressure in office.  No report of headache or epigastric pain.  +floaters.  No report of vaginal bleeding or contractions.    Past Medical History  Diagnosis Date  . Depression   . Genital herpes 2000  . Infection     UTI  . Ovarian cyst   . Abnormal Pap smear   . SVT (supraventricular tachycardia)     Past Surgical History  Procedure Laterality Date  . Knee surgery    . Breast enhancement surgery    . Tonsillectomy    . Addenoidectomy    . Wisdom tooth extraction      Family History  Problem Relation Age of Onset  . Alcoholism    . Leukemia    . Cervical cancer    . Bone cancer    . Cirrhosis    . Diabetes Mellitus II    . Depression    . Hypertension    . Thyroid cancer      History  Substance Use Topics  . Smoking status: Never Smoker   . Smokeless tobacco: Never Used  . Alcohol Use: No     Comment: 4 40's beer daily- not currently    Allergies:  Allergies  Allergen Reactions  . Penicillins Rash    Prescriptions prior to admission  Medication Sig Dispense Refill  . buprenorphine-naloxone (SUBOXONE) 8-2 MG SUBL Place 2 tablets under the tongue 3 (three) times daily with meals as needed.       . Prenatal Vit-Fe Fumarate-FA (PRENATAL MULTIVITAMIN) TABS Take 1 tablet by mouth daily at 12 noon.      . QUEtiapine (SEROQUEL) 100 MG tablet Take 100-200 mg by mouth 4 (four) times daily - after meals and at bedtime. Patient takes 100 mg 3 times daily and 200 mg at bedtime.      . traZODone (DESYREL) 100 MG tablet Take 100 mg by mouth at bedtime.      . valACYclovir (VALTREX) 1000 MG tablet Take 1 tablet (1,000 mg total) by mouth daily.  30 tablet  11  . nitrofurantoin,  macrocrystal-monohydrate, (MACROBID) 100 MG capsule Take 1 capsule (100 mg total) by mouth 2 (two) times daily.  14 capsule  0    Review of Systems  Eyes:       Floaters  Gastrointestinal: Negative for abdominal pain.  Neurological: Negative for headaches.  All other systems reviewed and are negative.   Physical Exam   Blood pressure 137/96, pulse 104, temperature 98 F (36.7 C), resp. rate 18, height 5\' 7"  (1.702 m), weight 93.441 kg (206 lb), last menstrual period 02/20/2012, SpO2 100.00%. Filed Vitals:   11/24/12 1301 11/24/12 1345 11/24/12 1346 11/24/12 1439  BP: 153/93 136/96 146/94 137/96  Pulse: 111   104  Temp:      Resp:    18  Height:      Weight:      SpO2:        Physical Exam  Constitutional: She is oriented to person, place, and time. She appears well-developed and well-nourished. No distress.  HENT:  Head: Normocephalic.  Eyes: Pupils are equal, round, and reactive to light.  Neck: Normal range of motion.  Neck supple.  Cardiovascular: Normal rate and regular rhythm.   Respiratory: Effort normal and breath sounds normal.  Genitourinary: There is no lesion on the right labia. There is no lesion on the left labia. No bleeding around the vagina.  Musculoskeletal:  Trace pedal edema  Neurological: She is alert and oriented to person, place, and time. She has normal reflexes. She displays normal reflexes.  Skin: Skin is warm and dry.  Dilation: 1 Effacement (%): 50 Cervical Position: Posterior Station: -3 Presentation: Vertex Exam by:: Roney Marion, CNM  FHR 130's, +accels, reactive Toco - none  MAU Course  Procedures  Results for orders placed during the hospital encounter of 11/24/12 (from the past 24 hour(s))  URINALYSIS, ROUTINE W REFLEX MICROSCOPIC     Status: Abnormal   Collection Time    11/24/12 12:35 PM      Result Value Range   Color, Urine STRAW (*) YELLOW   APPearance CLEAR  CLEAR   Specific Gravity, Urine <1.005 (*) 1.005 - 1.030   pH  7.0  5.0 - 8.0   Glucose, UA NEGATIVE  NEGATIVE mg/dL   Hgb urine dipstick NEGATIVE  NEGATIVE   Bilirubin Urine NEGATIVE  NEGATIVE   Ketones, ur NEGATIVE  NEGATIVE mg/dL   Protein, ur NEGATIVE  NEGATIVE mg/dL   Urobilinogen, UA 0.2  0.0 - 1.0 mg/dL   Nitrite NEGATIVE  NEGATIVE   Leukocytes, UA SMALL (*) NEGATIVE  URINE MICROSCOPIC-ADD ON     Status: None   Collection Time    11/24/12 12:35 PM      Result Value Range   Squamous Epithelial / LPF RARE  RARE   WBC, UA 0-2  <3 WBC/hpf   RBC / HPF 0-2  <3 RBC/hpf   Bacteria, UA RARE  RARE  CBC     Status: Abnormal   Collection Time    11/24/12  1:03 PM      Result Value Range   WBC 9.6  4.0 - 10.5 K/uL   RBC 4.08  3.87 - 5.11 MIL/uL   Hemoglobin 10.5 (*) 12.0 - 15.0 g/dL   HCT 16.1 (*) 09.6 - 04.5 %   MCV 77.0 (*) 78.0 - 100.0 fL   MCH 25.7 (*) 26.0 - 34.0 pg   MCHC 33.4  30.0 - 36.0 g/dL   RDW 40.9  81.1 - 91.4 %   Platelets 192  150 - 400 K/uL  COMPREHENSIVE METABOLIC PANEL     Status: Abnormal   Collection Time    11/24/12  1:03 PM      Result Value Range   Sodium 134 (*) 135 - 145 mEq/L   Potassium 3.6  3.5 - 5.1 mEq/L   Chloride 100  96 - 112 mEq/L   CO2 22  19 - 32 mEq/L   Glucose, Bld 115 (*) 70 - 99 mg/dL   BUN 6  6 - 23 mg/dL   Creatinine, Ser 7.82  0.50 - 1.10 mg/dL   Calcium 9.4  8.4 - 95.6 mg/dL   Total Protein 5.7 (*) 6.0 - 8.3 g/dL   Albumin 2.5 (*) 3.5 - 5.2 g/dL   AST 19  0 - 37 U/L   ALT 15  0 - 35 U/L   Alkaline Phosphatase 264 (*) 39 - 117 U/L   Total Bilirubin 0.2 (*) 0.3 - 1.2 mg/dL   GFR calc non Af Amer >90  >90 mL/min   GFR calc Af Amer >90  >90 mL/min  URIC ACID  Status: None   Collection Time    11/24/12  1:03 PM      Result Value Range   Uric Acid, Serum 3.8  2.4 - 7.0 mg/dL  LACTATE DEHYDROGENASE     Status: None   Collection Time    11/24/12  1:03 PM      Result Value Range   LDH 194  94 - 250 U/L     Assessment and Plan  Gestational Hypertension Category I FHR  Tracing  Plan: Consulted with Dr. Clearance Coots > admit to [facility] for Induction of Labor   [facility] 11/24/2012, 2:57 PM

## 2012-11-24 NOTE — Progress Notes (Signed)
P 102 Patient is ready to have this baby

## 2012-11-25 ENCOUNTER — Encounter (HOSPITAL_COMMUNITY): Payer: Self-pay | Admitting: Anesthesiology

## 2012-11-25 ENCOUNTER — Inpatient Hospital Stay (HOSPITAL_COMMUNITY): Payer: Medicaid Other | Admitting: Anesthesiology

## 2012-11-25 LAB — CBC
Hemoglobin: 10.9 g/dL — ABNORMAL LOW (ref 12.0–15.0)
MCH: 25.9 pg — ABNORMAL LOW (ref 26.0–34.0)
MCHC: 33.6 g/dL (ref 30.0–36.0)
MCV: 77 fL — ABNORMAL LOW (ref 78.0–100.0)
RBC: 4.21 MIL/uL (ref 3.87–5.11)

## 2012-11-25 LAB — TYPE AND SCREEN: ABO/RH(D): A POS

## 2012-11-25 MED ORDER — PROMETHAZINE HCL 25 MG/ML IJ SOLN
12.5000 mg | Freq: Once | INTRAMUSCULAR | Status: AC
  Administered 2012-11-25: 12.5 mg via INTRAVENOUS
  Filled 2012-11-25: qty 1

## 2012-11-25 MED ORDER — BUPRENORPHINE HCL 8 MG SL SUBL: 8.0000 mg | SUBLINGUAL_TABLET | Freq: Two times a day (BID) | SUBLINGUAL | Status: DC

## 2012-11-25 MED ORDER — OXYTOCIN 40 UNITS IN LACTATED RINGERS INFUSION - SIMPLE MED
1.0000 m[IU]/min | INTRAVENOUS | Status: DC
  Administered 2012-11-25: 2 m[IU]/min via INTRAVENOUS

## 2012-11-25 MED ORDER — FENTANYL 2.5 MCG/ML BUPIVACAINE 1/10 % EPIDURAL INFUSION (WH - ANES)
INTRAMUSCULAR | Status: DC | PRN
  Administered 2012-11-25: 14 mL/h via EPIDURAL

## 2012-11-25 MED ORDER — OXYTOCIN 40 UNITS IN LACTATED RINGERS INFUSION - SIMPLE MED: 1.0000 m[IU]/min | INTRAVENOUS | Status: DC

## 2012-11-25 MED ORDER — BUPRENORPHINE HCL 8 MG SL SUBL
16.0000 mg | SUBLINGUAL_TABLET | Freq: Every day | SUBLINGUAL | Status: DC
  Administered 2012-11-25 – 2012-11-26 (×2): 16 mg via SUBLINGUAL
  Filled 2012-11-25 (×2): qty 2

## 2012-11-25 MED ORDER — QUETIAPINE FUMARATE 200 MG PO TABS
200.0000 mg | ORAL_TABLET | Freq: Every day | ORAL | Status: DC
  Administered 2012-11-25: 200 mg via ORAL
  Filled 2012-11-25 (×2): qty 1

## 2012-11-25 MED ORDER — LIDOCAINE HCL (PF) 1 % IJ SOLN
INTRAMUSCULAR | Status: DC | PRN
  Administered 2012-11-25 (×2): 4 mL

## 2012-11-25 MED ORDER — BUPRENORPHINE HCL 8 MG SL SUBL
8.0000 mg | SUBLINGUAL_TABLET | Freq: Every day | SUBLINGUAL | Status: DC
  Administered 2012-11-25 – 2012-11-26 (×2): 8 mg via SUBLINGUAL
  Filled 2012-11-25 (×2): qty 1

## 2012-11-25 MED ORDER — QUETIAPINE FUMARATE 100 MG PO TABS
100.0000 mg | ORAL_TABLET | Freq: Three times a day (TID) | ORAL | Status: DC
  Administered 2012-11-25: 100 mg via ORAL
  Filled 2012-11-25 (×8): qty 1

## 2012-11-25 MED ORDER — QUETIAPINE FUMARATE 100 MG PO TABS: 100.0000 mg | ORAL_TABLET | Freq: Three times a day (TID) | ORAL | Status: DC

## 2012-11-25 MED ORDER — TRAZODONE HCL 100 MG PO TABS
100.0000 mg | ORAL_TABLET | Freq: Every day | ORAL | Status: DC
  Administered 2012-11-25: 100 mg via ORAL
  Filled 2012-11-25 (×2): qty 1

## 2012-11-25 NOTE — Anesthesia Preprocedure Evaluation (Addendum)
Anesthesia Evaluation  Patient identified by MRN, date of birth, ID band Patient awake    Reviewed: Allergy & Precautions, H&P , Patient's Chart, lab work & pertinent test results  Airway Mallampati: III TM Distance: >3 FB Neck ROM: Full    Dental no notable dental hx. (+) Teeth Intact   Pulmonary neg pulmonary ROS,  breath sounds clear to auscultation  Pulmonary exam normal       Cardiovascular hypertension, + dysrhythmias Supra Ventricular Tachycardia Rhythm:Regular Rate:Normal     Neuro/Psych PSYCHIATRIC DISORDERS Depression negative neurological ROS     GI/Hepatic (+)     substance abuse  IV drug use,   Endo/Other  negative endocrine ROS  Renal/GU negative Renal ROS  negative genitourinary   Musculoskeletal negative musculoskeletal ROS (+)   Abdominal   Peds  Hematology negative hematology ROS (+)   Anesthesia Other Findings   Reproductive/Obstetrics (+) Pregnancy HSV PIH on Magnesium Sulfate                          Anesthesia Physical Anesthesia Plan  ASA: II  Anesthesia Plan: Epidural   Post-op Pain Management:    Induction:   Airway Management Planned: Natural Airway  Additional Equipment:   Intra-op Plan:   Post-operative Plan:   Informed Consent: I have reviewed the patients History and Physical, chart, labs and discussed the procedure including the risks, benefits and alternatives for the proposed anesthesia with the patient or authorized representative who has indicated his/her understanding and acceptance.     Plan Discussed with: Anesthesiologist  Anesthesia Plan Comments:         Anesthesia Quick Evaluation

## 2012-11-25 NOTE — Progress Notes (Signed)
Megan Edwards is a 37 y.o. G4P0030 at [redacted]w[redacted]d by LMP admitted for induction of labor due to [facility]  Subjective: Comfortable  Objective: BP 149/97  Pulse 97  Temp(Src) 97.9 F (36.6 C) (Oral)  Resp 16  Ht 5\' 7"  (1.702 m)  Wt 206 lb (93.441 kg)  BMI 32.26 kg/m2  SpO2 100%  LMP 02/20/2012 I/O last 3 completed shifts: In: 1781.4 [P.O.:840; I.V.:941.4] Out: 1900 [Urine:1900] Total I/O In: 726.4 [P.O.:480; I.V.:246.4] Out: 450 [Urine:450]  FHT:  FHR: 140 bpm, variability: moderate,  accelerations:  Present,  decelerations:  Absent UC:   irregular, every 5 minutes SVE:   Dilation: 2 Effacement (%): 70 Station: -1 Exam by:: Marijean Heath, Rn Unable to AROM Labs: Lab Results  Component Value Date   WBC 10.1 11/24/2012   HGB 10.3* 11/24/2012   HCT 30.9* 11/24/2012   MCV 76.3* 11/24/2012   PLT 186 11/24/2012    Assessment / Plan: Undergoing augmentation of labor secondary to severe PIH H/O opioid  dependence Labor: latent labor Preeclampsia:  on magnesium sulfate, no signs or symptoms of toxicity, intake and ouput balanced and labs stable Fetal Wellbeing:  Category I Pain Control:  IV narcotics I/D:  n/a Anticipated MOD:  NSVD Resume maintenance opioid/mood stabilizer JACKSON-MOORE,Doni Bacha A 11/25/2012, 9:30 AM

## 2012-11-25 NOTE — Progress Notes (Signed)
Patient ID: Megan Edwards, female   DOB: [birth year], 37 y.o.   MRN: [id number] Megan Edwards is a 37 y.o. G4P0030 at [redacted]w[redacted]d by LMP admitted for induction of labor due to [facility].  Subjective: C/O HA.  Objective: BP 127/83  Pulse 92  Temp(Src) 97.7 F (36.5 C) (Oral)  Resp 10  Ht 5\' 7"  (1.702 m)  Wt 206 lb (93.441 kg)  BMI 32.26 kg/m2  SpO2 100%  LMP 02/20/2012 I/O last 3 completed shifts: In: 1781.4 [P.O.:840; I.V.:941.4] Out: 1900 [Urine:1900] Total I/O In: 1395.1 [P.O.:900; I.V.:495.1] Out: 1500 [Urine:1500]  FHT:  FHR: 140 bpm, variability: moderate,  accelerations:  Present,  decelerations:  Absent UC:   irregular, every 5 minutes SVE:   Dilation: 2 Effacement (%): 70 Station: -1 Exam by:: Marijean Heath, Rn AROM for clear fluid   Assessment / Plan: Undergoing augmentation of labor secondary to severe PIH H/O opioid  dependence Labor: latent labor Preeclampsia:  on magnesium sulfate, no signs or symptoms of toxicity, intake and ouput balanced and labs stable; possible neurological symptoms; B/Ps labile Fetal Wellbeing:  Category I Pain Control:  IV narcotics I/D:  n/a Anticipated MOD:  NSVD Resume maintenance opioid/mood stabilizer Megan Edwards A 11/25/2012, 1:33 PM

## 2012-11-25 NOTE — Anesthesia Procedure Notes (Signed)
Epidural Patient location during procedure: OB Start time: 11/25/2012 2:46 PM  Staffing Anesthesiologist: Nell Schrack A. Performed by: anesthesiologist   Preanesthetic Checklist Completed: patient identified, site marked, surgical consent, pre-op evaluation, timeout performed, IV checked, risks and benefits discussed and monitors and equipment checked  Epidural Patient position: sitting Prep: site prepped and draped and DuraPrep Patient monitoring: continuous pulse ox and blood pressure Approach: midline Injection technique: LOR air  Needle:  Needle type: Tuohy  Needle gauge: 17 G Needle length: 9 cm and 9 Needle insertion depth: 6 cm Catheter type: closed end flexible Catheter size: 19 Gauge Catheter at skin depth: 11 cm Test dose: negative and Other  Assessment Events: blood not aspirated, injection not painful, no injection resistance, negative IV test and no paresthesia  Additional Notes Patient identified. Risks and benefits discussed including failed block, incomplete  Pain control, post dural puncture headache, nerve damage, paralysis, blood pressure Changes, nausea, vomiting, reactions to medications-both toxic and allergic and post Partum back pain. All questions were answered. Patient expressed understanding and wished to proceed. Sterile technique was used throughout procedure. Epidural site was Dressed with sterile barrier dressing. No paresthesias, signs of intravascular injection Or signs of intrathecal spread were encountered.  Patient was more comfortable after the epidural was dosed. Please see RN's note for documentation of vital signs and FHR which are stable.

## 2012-11-25 NOTE — Progress Notes (Signed)
Patient ID: Megan Edwards, female   DOB: 11-Mar-1976, 37 y.o.   MRN: [id number] Megan Edwards is a 37 y.o. G4P0030 at [redacted]w[redacted]d by LMP admitted for induction of labor due to [facility].  Subjective: Comfortable  Objective: BP 141/76  Pulse 95  Temp(Src) 97.6 F (36.4 C) (Oral)  Resp 16  Ht 5\' 7"  (1.702 m)  Wt 206 lb (93.441 kg)  BMI 32.26 kg/m2  SpO2 100%  LMP 02/20/2012 I/O last 3 completed shifts: In: 5346 [P.O.:2610; I.V.:2736] Out: 4425 [Urine:4425] Total I/O In: 285.2 [P.O.:100; I.V.:185.2] Out: 1200 [Urine:1200]  FHT:  FHR: 140 bpm, variability: moderate,  accelerations:  Present,  decelerations:  Absent UC:   irregular, every 5 minutes SVE:   Dilation: 4 Effacement (%): 80 Station: -1 Exam by:: Dr. Tamela Oddi IUPC placed   Assessment / Plan: Undergoing augmentation of labor secondary to severe PIH H/O opioid  dependence Labor: latent labor Preeclampsia:  on magnesium sulfate, no signs or symptoms of toxicity, intake and ouput balanced and labs stable; B/Ps OK Fetal Wellbeing:  Category I Pain Control:  Epidural I/D:  n/a Anticipated MOD:  NSVD  JACKSON-MOORE,Narciso Stoutenburg A 11/25/2012, 10:54 PM

## 2012-11-26 ENCOUNTER — Encounter: Payer: Self-pay | Admitting: Obstetrics

## 2012-11-26 ENCOUNTER — Encounter (HOSPITAL_COMMUNITY): Payer: Self-pay | Admitting: *Deleted

## 2012-11-26 DIAGNOSIS — O169 Unspecified maternal hypertension, unspecified trimester: Secondary | ICD-10-CM

## 2012-11-26 LAB — CBC
Hemoglobin: 9.9 g/dL — ABNORMAL LOW (ref 12.0–15.0)
RBC: 3.89 MIL/uL (ref 3.87–5.11)

## 2012-11-26 LAB — MRSA PCR SCREENING: MRSA by PCR: NEGATIVE

## 2012-11-26 MED ORDER — ONDANSETRON HCL 4 MG/2ML IJ SOLN: 4.0000 mg | INTRAMUSCULAR | Status: DC | PRN

## 2012-11-26 MED ORDER — ZOLPIDEM TARTRATE 5 MG PO TABS
5.0000 mg | ORAL_TABLET | Freq: Every evening | ORAL | Status: DC | PRN
  Administered 2012-11-26 – 2012-11-27 (×2): 5 mg via ORAL
  Filled 2012-11-26 (×2): qty 1

## 2012-11-26 MED ORDER — OXYCODONE-ACETAMINOPHEN 5-325 MG PO TABS: 1.0000 | ORAL_TABLET | ORAL | Status: DC | PRN

## 2012-11-26 MED ORDER — CARBOPROST TROMETHAMINE 250 MCG/ML IM SOLN
INTRAMUSCULAR | Status: AC
  Administered 2012-11-26: 250 ug via INTRAMUSCULAR
  Filled 2012-11-26: qty 1

## 2012-11-26 MED ORDER — MEDROXYPROGESTERONE ACETATE 150 MG/ML IM SUSP: 150.0000 mg | INTRAMUSCULAR | Status: DC | PRN

## 2012-11-26 MED ORDER — DIPHENOXYLATE-ATROPINE 2.5-0.025 MG PO TABS
2.0000 | ORAL_TABLET | Freq: Once | ORAL | Status: AC
  Administered 2012-11-26: 2 via ORAL
  Filled 2012-11-26: qty 2

## 2012-11-26 MED ORDER — SENNOSIDES-DOCUSATE SODIUM 8.6-50 MG PO TABS
2.0000 | ORAL_TABLET | Freq: Every day | ORAL | Status: DC
  Administered 2012-11-26 – 2012-11-27 (×2): 2 via ORAL

## 2012-11-26 MED ORDER — WITCH HAZEL-GLYCERIN EX PADS
1.0000 "application " | MEDICATED_PAD | CUTANEOUS | Status: DC | PRN
  Administered 2012-11-26: 1 via TOPICAL

## 2012-11-26 MED ORDER — ONDANSETRON HCL 4 MG PO TABS: 4.0000 mg | ORAL_TABLET | ORAL | Status: DC | PRN

## 2012-11-26 MED ORDER — DIBUCAINE 1 % RE OINT: 1.0000 "application " | TOPICAL_OINTMENT | RECTAL | Status: DC | PRN

## 2012-11-26 MED ORDER — ACETAMINOPHEN 500 MG PO TABS
1000.0000 mg | ORAL_TABLET | Freq: Once | ORAL | Status: AC
  Administered 2012-11-26: 1000 mg via ORAL
  Filled 2012-11-26: qty 2

## 2012-11-26 MED ORDER — SIMETHICONE 80 MG PO CHEW: 80.0000 mg | CHEWABLE_TABLET | ORAL | Status: DC | PRN

## 2012-11-26 MED ORDER — TETANUS-DIPHTH-ACELL PERTUSSIS 5-2.5-18.5 LF-MCG/0.5 IM SUSP
0.5000 mL | Freq: Once | INTRAMUSCULAR | Status: DC
  Filled 2012-11-26: qty 0.5

## 2012-11-26 MED ORDER — QUETIAPINE FUMARATE 200 MG PO TABS
200.0000 mg | ORAL_TABLET | Freq: Every day | ORAL | Status: DC
  Administered 2012-11-26 – 2012-11-27 (×2): 200 mg via ORAL
  Filled 2012-11-26 (×3): qty 1

## 2012-11-26 MED ORDER — MISOPROSTOL 200 MCG PO TABS
ORAL_TABLET | ORAL | Status: AC
  Filled 2012-11-26: qty 4

## 2012-11-26 MED ORDER — OXYTOCIN 40 UNITS IN LACTATED RINGERS INFUSION - SIMPLE MED: 250.0000 mL/h | INTRAVENOUS | Status: DC

## 2012-11-26 MED ORDER — MAGNESIUM SULFATE 40 G IN LACTATED RINGERS - SIMPLE
2.0000 g/h | INTRAVENOUS | Status: AC
  Filled 2012-11-26: qty 500

## 2012-11-26 MED ORDER — OXYTOCIN 40 UNITS IN LACTATED RINGERS INFUSION - SIMPLE MED: 62.5000 mL/h | INTRAVENOUS | Status: DC | PRN

## 2012-11-26 MED ORDER — PRENATAL MULTIVITAMIN CH
1.0000 | ORAL_TABLET | Freq: Every day | ORAL | Status: DC
  Administered 2012-11-27 – 2012-11-28 (×2): 1 via ORAL
  Filled 2012-11-26 (×2): qty 1

## 2012-11-26 MED ORDER — DIPHENHYDRAMINE HCL 25 MG PO CAPS: 25.0000 mg | ORAL_CAPSULE | Freq: Four times a day (QID) | ORAL | Status: DC | PRN

## 2012-11-26 MED ORDER — CARBOPROST TROMETHAMINE 250 MCG/ML IM SOLN: 250.0000 ug | INTRAMUSCULAR | Status: DC | PRN

## 2012-11-26 MED ORDER — LANOLIN HYDROUS EX OINT: TOPICAL_OINTMENT | CUTANEOUS | Status: DC | PRN

## 2012-11-26 MED ORDER — PROMETHAZINE HCL 25 MG/ML IJ SOLN
12.5000 mg | Freq: Four times a day (QID) | INTRAMUSCULAR | Status: DC | PRN
  Administered 2012-11-26: 12.5 mg via INTRAVENOUS
  Filled 2012-11-26: qty 1

## 2012-11-26 MED ORDER — BENZOCAINE-MENTHOL 20-0.5 % EX AERO
1.0000 "application " | INHALATION_SPRAY | CUTANEOUS | Status: DC | PRN
  Administered 2012-11-26: 1 via TOPICAL
  Filled 2012-11-26: qty 56

## 2012-11-26 MED ORDER — IBUPROFEN 600 MG PO TABS
600.0000 mg | ORAL_TABLET | Freq: Four times a day (QID) | ORAL | Status: DC
  Administered 2012-11-26 – 2012-11-28 (×8): 600 mg via ORAL
  Filled 2012-11-26 (×8): qty 1

## 2012-11-26 MED ORDER — LACTATED RINGERS IV SOLN
INTRAVENOUS | Status: DC
  Administered 2012-11-27: 02:00:00 via INTRAVENOUS

## 2012-11-26 NOTE — Progress Notes (Signed)
Megan Edwards is a 37 y.o. G4P0030 at [redacted]w[redacted]d by LMP admitted for induction of labor due to Hypertension.  Subjective:   Objective: BP 126/84  Pulse 111  Temp(Src) 98.2 F (36.8 C) (Oral)  Resp 17  Ht 5\' 7"  (1.702 m)  Wt 206 lb (93.441 kg)  BMI 32.26 kg/m2  SpO2 100%  LMP 02/20/2012 I/O last 3 completed shifts: In: 7330.6 [P.O.:3570; I.V.:3760.6] Out: 7725 [Urine:7725] Total I/O In: 230.7 [I.V.:230.7] Out: 125 [Urine:125]  FHT:  FHR: 150-160 bpm, variability: moderate,  accelerations:  Present,  decelerations:  Absent UC:   regular, every 2-5 minutes SVE:   Dilation: 9 Effacement (%): 100 Station: 0 Exam by:: B.Cagna,Rn  Labs: Lab Results  Component Value Date   WBC 12.3* 11/25/2012   HGB 10.9* 11/25/2012   HCT 32.4* 11/25/2012   MCV 77.0* 11/25/2012   PLT 180 11/25/2012    Assessment / Plan: Induction of labor due to gestational hypertension,  progressing well on pitocin  Labor: Progressing normally Preeclampsia:  on magnesium sulfate Fetal Wellbeing:  Category I Pain Control:  Epidural I/D:  n/a Anticipated MOD:  NSVD  Megan Edwards A 11/26/2012, 9:36 AM

## 2012-11-27 ENCOUNTER — Encounter (HOSPITAL_COMMUNITY): Payer: Self-pay | Admitting: *Deleted

## 2012-11-27 LAB — CBC
HCT: 24.8 % — ABNORMAL LOW (ref 36.0–46.0)
Hemoglobin: 8.4 g/dL — ABNORMAL LOW (ref 12.0–15.0)
MCH: 25.9 pg — ABNORMAL LOW (ref 26.0–34.0)
MCHC: 33.9 g/dL (ref 30.0–36.0)
MCV: 76.5 fL — ABNORMAL LOW (ref 78.0–100.0)
Platelets: 153 10*3/uL (ref 150–400)
RBC: 3.24 MIL/uL — ABNORMAL LOW (ref 3.87–5.11)
RDW: 15.3 % (ref 11.5–15.5)
WBC: 16.7 10*3/uL — ABNORMAL HIGH (ref 4.0–10.5)

## 2012-11-27 MED ORDER — LABETALOL HCL 200 MG PO TABS
200.0000 mg | ORAL_TABLET | Freq: Three times a day (TID) | ORAL | Status: DC
  Administered 2012-11-27 – 2012-11-28 (×5): 200 mg via ORAL
  Filled 2012-11-27 (×8): qty 1

## 2012-11-27 MED ORDER — LACTATED RINGERS IV SOLN: INTRAVENOUS | Status: AC

## 2012-11-27 MED ORDER — BUPRENORPHINE HCL 8 MG SL SUBL
8.0000 mg | SUBLINGUAL_TABLET | Freq: Every day | SUBLINGUAL | Status: DC
  Administered 2012-11-27 – 2012-11-28 (×2): 8 mg via SUBLINGUAL
  Filled 2012-11-27 (×3): qty 1

## 2012-11-27 MED ORDER — BUPRENORPHINE HCL 8 MG SL SUBL
16.0000 mg | SUBLINGUAL_TABLET | Freq: Every day | SUBLINGUAL | Status: DC
  Administered 2012-11-27 – 2012-11-28 (×2): 16 mg via SUBLINGUAL
  Filled 2012-11-27: qty 2
  Filled 2012-11-27: qty 1

## 2012-11-27 MED ORDER — QUETIAPINE FUMARATE 100 MG PO TABS
100.0000 mg | ORAL_TABLET | Freq: Two times a day (BID) | ORAL | Status: DC
  Administered 2012-11-27 – 2012-11-28 (×3): 100 mg via ORAL
  Filled 2012-11-27 (×5): qty 1

## 2012-11-27 NOTE — Anesthesia Postprocedure Evaluation (Signed)
Anesthesia Post Note  Patient: Megan Edwards  Procedure(s) Performed: * No procedures listed *  Anesthesia type: Epidural  Patient location: Mother/Baby  Post pain: Pain level controlled  Post assessment: Post-op Vital signs reviewed  Last Vitals:  Filed Vitals:   11/27/12 0641  BP:   Pulse: 79  Temp:   Resp: 20    Post vital signs: Reviewed  Level of consciousness:alert  Complications: No apparent anesthesia complications

## 2012-11-27 NOTE — Progress Notes (Signed)
Baby to nursery

## 2012-11-27 NOTE — Progress Notes (Signed)
Post Partum Day 1 Subjective: no complaints  Objective: Blood pressure 115/68, pulse 79, temperature 97.7 F (36.5 C), temperature source Oral, resp. rate 20, height 5\' 7"  (1.702 m), weight 195 lb 12.8 oz (88.814 kg), last menstrual period 02/20/2012, SpO2 100.00%, unknown if currently breastfeeding.  Physical Exam:  General: alert and no distress Lochia: appropriate Uterine Fundus: firm Incision: healing well DVT Evaluation: No evidence of DVT seen on physical exam.   Recent Labs  11/26/12 1310 11/27/12 0504  HGB 9.9* 8.4*  HCT 29.6* 24.8*    Assessment/Plan: Plan for discharge tomorrow.  Anemia.  Clinically stable.  Iron started.   LOS: 3 days   Megan Edwards A 11/27/2012, 8:50 AM

## 2012-11-27 NOTE — Progress Notes (Signed)
UR chart review completed.  

## 2012-11-27 NOTE — Clinical Social Work Maternal (Signed)
Clinical Social Work Department  PSYCHOSOCIAL ASSESSMENT - MATERNAL/CHILD  11/27/2012  Patient: Megan Edwards,Megan Edwards Account Number: [id number] Admit Date: 11/24/2012  Childs Name:  Jayden [name] Little   Clinical Social Worker: Enjoli Tidd, LCSW Date/Time: 11/27/2012 12:43 PM  Date Referred: 11/27/2012  Referral source   CN    Referred reason   Substance Abuse   Behavioral Health Issues   Other referral source:  I: FAMILY / HOME ENVIRONMENT  Child's legal guardian: PARENT  Guardian - Name  Guardian - Age  Guardian - Address   Nikitta Zapata  37  [address] [place], [place] [address]   Tim Little  42    Other household support members/support persons  Name  Relationship  DOB   Lina Hardy  MOTHER    Other support:  II PSYCHOSOCIAL DATA  Information Source: Patient Interview  Financial and Community Resources  Employment:  Financial resources: Medicaid  If Medicaid - County: [place]  Other   Food Stamps   WIC   School / Grade:  Maternity Care Coordinator / Child Services Coordination / Early Interventions: Cultural issues impacting care:  III STRENGTHS  Strengths   Adequate Resources   Home prepared for Child (including basic supplies)   Supportive family/friends   Strength comment:  IV RISK FACTORS AND CURRENT PROBLEMS  Current Problem: YES  Risk Factor & Current Problem  Patient Issue  Family Issue  Risk Factor / Current Problem Comment   Substance Abuse  Y  N  Hx of polysubstance abuse   Mental Illness  Y  N  Hx bipolar disorder, SI    N  N    V SOCIAL WORK ASSESSMENT  CSW referral received to assess pt's history of polysubstance abuse & mental illness diagnoses. Pt is currently living with her mother, who she identifies as her primary support person. She acknowledges a lengthy history of substance abuse however denies any use on "over 1 year." Previous [facility] admissions noted. Pt experienced SI in 5/13 & was hospitalized a [facility]. Once she discharged from the [facility], that was the  start of pt's journey to sobriety. She sought treatment at [facility] & started Suboxone maintenance program. She is currently taking 8mg, 3 times a day. She denies any positive screens since she started the maintenance program in 5/13. Pt is aware that the infant will likely experience withdrawals symptoms, which may require NICU admission. She is ambivalent about breastfeeding at this time. She explained that she is willing to do what is best for him but would like for the medication to be completely out of his system once discharged. CSW will ask the pediatrician to speak with pt to discuss pro's/con's. Pt's mother is aware of pt's history but other family/ friends are not. She appears to be appropriately concerned at this time & engaged in conversation. Pt started receiving mental health services in May '13, as well. Her symptoms are being treated with Seroquel & Trazodone, prescribed by Dr. Litz at [facility]. She is seen once a month & plans to continue upon discharge. She denies any depression symptoms now. No SI since last year. Pt has clothes for the baby but does not have a car seat or crib. CSW told pt about the $30 car seat fee. CSW encouraged pt to use local consignment shops as a resource for supplies. Pt was receptive to information discussed & seems committed to doing what is best for her baby. She is not sure if the FOB will be involved.   CSW will monitor drug screen results & continue to offer services as needed until discharge.   VI SOCIAL WORK PLAN  Social Work Plan   No Further Intervention Required / No Barriers to Discharge   Type of pt/family education:  If child protective services report - county:  If child protective services report - date:  Information/referral to community resources comment:  Other social work plan:   

## 2012-11-28 DIAGNOSIS — IMO0002 Reserved for concepts with insufficient information to code with codable children: Secondary | ICD-10-CM | POA: Diagnosis not present

## 2012-11-28 MED ORDER — ACETAMINOPHEN 325 MG PO TABS
650.0000 mg | ORAL_TABLET | Freq: Once | ORAL | Status: AC
Start: 2012-11-28 — End: 2012-11-28
  Administered 2012-11-28: 650 mg via ORAL
  Filled 2012-11-28: qty 2

## 2012-11-28 MED ORDER — LABETALOL HCL 200 MG PO TABS: 200.0000 mg | ORAL_TABLET | Freq: Three times a day (TID) | ORAL | Status: DC

## 2012-11-28 MED ORDER — FERROUS SULFATE 325 (65 FE) MG PO TABS: 325.0000 mg | ORAL_TABLET | Freq: Two times a day (BID) | ORAL | Status: DC

## 2012-11-28 NOTE — Discharge Summary (Signed)
  Obstetric Discharge Summary Reason for Admission: induction of labor Prenatal Procedures: none Intrapartum Procedures: spontaneous vaginal delivery Postpartum Procedures: none Complications-Operative and Postpartum: hemorrhage  Hemoglobin  Date Value Range Status  11/27/2012 8.4* 12.0 - 15.0 g/dL Final     HCT  Date Value Range Status  11/27/2012 24.8* 36.0 - 46.0 % Final    Physical Exam:  General: alert Lochia: appropriate Uterine: firm Incision: n/a DVT Evaluation: No evidence of DVT seen on physical exam.  Discharge Diagnoses: Active Problems:   Pregnancy induced hypertension   Normal delivery   Immediate postpartum hemorrhage, with delivery   Maternal anemia complicating pregnancy, childbirth, or the puerperium   Discharge Information: Date: 11/28/2012 Activity: pelvic rest Diet: routine Medications:  Prior to Admission medications   Medication Sig Start Date End Date Taking? Authorizing Provider  buprenorphine-naloxone (SUBOXONE) 8-2 MG SUBL SL tablet Place 1-2 tablets under the tongue 2 (two) times daily. Pt takes 2 tablets (16mg ) in the morning and 1 tablet (8mg ) at 3 pm.   Yes Historical Provider, MD  Prenatal Vit-Fe Fumarate-FA (PRENATAL MULTIVITAMIN) TABS Take 1 tablet by mouth daily at 12 noon.   Yes Historical Provider, MD  QUEtiapine (SEROQUEL) 100 MG tablet Take 100-200 mg by mouth 4 (four) times daily - after meals and at bedtime. Patient takes 100 mg 3 times daily and 200 mg at bedtime. 06/30/12  Yes Lyanne Co, MD  traZODone (DESYREL) 100 MG tablet Take 100 mg by mouth at bedtime.   Yes Historical Provider, MD  valACYclovir (VALTREX) 1000 MG tablet Take 1 tablet (1,000 mg total) by mouth daily. 06/17/12  Yes Brock Bad, MD  ferrous sulfate 325 (65 FE) MG tablet Take 1 tablet (325 mg total) by mouth 2 (two) times daily before a meal. 11/28/12   Antionette Char, MD  labetalol (NORMODYNE) 200 MG tablet Take 1 tablet (200 mg total) by mouth 3 (three)  times daily. 11/28/12   Antionette Char, MD    Condition: stable Instructions: refer to routine discharge instructions Discharge to: home Follow-up Information   Follow up with HARPER,CHARLES A, MD In 3 days.   Specialty:  Obstetrics and Gynecology   Contact information:   [address] [place] [address] [phone]       Newborn Data:  Live born female  Birth Weight: 8 lb 15.9 oz (4080 g) APGAR: 7, 9   Home with mother.  Megan Edwards,Megan Edwards A 11/28/2012, 11:32 AM

## 2012-11-29 ENCOUNTER — Ambulatory Visit: Payer: Self-pay

## 2012-11-29 NOTE — Lactation Note (Signed)
This note was copied from the chart of Megan Edwards. Lactation Consultation Note   Initial consult with this mom of a term baby, in NICU for NAS. Mom decided today, at 72 hours post partum, that she wanted to  try and provide breast milk for her baby. She was first told she could not - she is on trazadone, seroquil and saboxone.   Dr. Mikle Bosworth and Darryl Lent, NNP, have spoken to  mom. Mom is aware they may decided against using mom's milk, due to safety concerns. Dr. Mikle Bosworth will let mom know one way or the other sometime today. I loaned mom a DEP, and briefly explained to her how to use it - mom  Had somone picking her up who could not wait . Mom knows to call lactation for questions/concerns..  Patient Name: Megan Edwards Date: 11/29/2012 Reason for consult: Initial assessment;NICU baby   Maternal Data    Feeding Feeding Type: Formula Nipple Type: Regular  LATCH Score/Interventions                      Lactation Tools Discussed/Used WIC Program: Yes Pump Review: Setup, frequency, and cleaning Initiated by:: c Waunetta Riggle  Date initiated:: 11/29/12   Consult Status Consult Status: Follow-up Date: 11/30/12 Follow-up type: In-patient (in NICU)    Alfred Levins 11/29/2012, 1:26 PM

## 2012-11-30 ENCOUNTER — Encounter: Payer: Medicaid Other | Admitting: Obstetrics

## 2012-12-01 ENCOUNTER — Ambulatory Visit: Payer: Self-pay

## 2012-12-01 NOTE — Lactation Note (Signed)
This note was copied from the chart of Megan Mischell [place]. Lactation Consultation Note  Follow up consult with this mom and term baby, in NICU for NAS. Baby is 84 days old, and this was his first time breast feeding. He latched well, lips flng with mom, and showed her how to  Hand express. i will follow this family in NICU.  Patient Name: Megan Edwards [id number] Date: 12/01/2012 Reason for consult: Follow-up assessment;NICU baby   Maternal Data    Feeding Feeding Type: Formula Nipple Type: Regular Length of feed: 30 min  LATCH Score/Interventions Latch: Grasps breast easily, tongue down, lips flanged, rhythmical sucking.  Audible Swallowing: Spontaneous and intermittent  Type of Nipple: Everted at rest and after stimulation  Comfort (Breast/Nipple): Soft / non-tender  Problem noted: Filling  Hold (Positioning): Assistance needed to correctly position infant at breast and maintain latch.  LATCH Score: 9  Lactation Tools Discussed/Used WIC Program: Yes   Consult Status Consult Status: PRN Follow-up type:  (in NICU)    Alfred Levins 12/01/2012, 2:36 PM

## 2012-12-05 ENCOUNTER — Ambulatory Visit: Payer: Self-pay

## 2012-12-05 NOTE — Lactation Note (Signed)
This note was copied from the chart of Megan Roniqua Kintz. Lactation Consultation Note Mom returned her loaner pump. Mom left hospital before I could get her $30 deposit to her, mom stated she would return later today. Mom's $30 cash deposit was left in the care of RN Gunnar Fusi who placed the money in baby's bedside drawer.  Patient Name: Megan Edwards Date: 12/05/2012     Maternal Data    Feeding Feeding Type: Breast Milk with Formula added Nipple Type: Regular Length of feed: 30 min  LATCH Score/Interventions                      Lactation Tools Discussed/Used     Consult Status      Megan Edwards 12/05/2012, 4:23 PM

## 2013-07-23 ENCOUNTER — Ambulatory Visit: Payer: Medicaid Other | Admitting: Obstetrics

## 2013-07-26 ENCOUNTER — Ambulatory Visit: Payer: Medicaid Other | Admitting: Obstetrics

## 2014-01-24 ENCOUNTER — Encounter (HOSPITAL_COMMUNITY): Payer: Self-pay | Admitting: *Deleted

## 2014-02-01 ENCOUNTER — Ambulatory Visit: Payer: Self-pay | Admitting: Internal Medicine

## 2014-02-24 ENCOUNTER — Ambulatory Visit: Payer: Medicaid Other | Admitting: Family Medicine

## 2014-03-16 ENCOUNTER — Ambulatory Visit (HOSPITAL_BASED_OUTPATIENT_CLINIC_OR_DEPARTMENT_OTHER): Payer: Medicaid Other | Admitting: *Deleted

## 2014-03-16 ENCOUNTER — Encounter: Payer: Self-pay | Admitting: Internal Medicine

## 2014-03-16 ENCOUNTER — Ambulatory Visit: Payer: Medicaid Other | Attending: Internal Medicine | Admitting: Internal Medicine

## 2014-03-16 VITALS — BP 151/87 | HR 84 | Temp 98.4°F | Resp 16 | Ht 67.0 in | Wt 210.0 lb

## 2014-03-16 DIAGNOSIS — T402X5A Adverse effect of other opioids, initial encounter: Secondary | ICD-10-CM | POA: Diagnosis not present

## 2014-03-16 DIAGNOSIS — K5909 Other constipation: Secondary | ICD-10-CM | POA: Diagnosis not present

## 2014-03-16 DIAGNOSIS — I10 Essential (primary) hypertension: Secondary | ICD-10-CM | POA: Diagnosis not present

## 2014-03-16 DIAGNOSIS — A6000 Herpesviral infection of urogenital system, unspecified: Secondary | ICD-10-CM | POA: Insufficient documentation

## 2014-03-16 DIAGNOSIS — K088 Other specified disorders of teeth and supporting structures: Secondary | ICD-10-CM | POA: Diagnosis not present

## 2014-03-16 DIAGNOSIS — K5903 Drug induced constipation: Secondary | ICD-10-CM

## 2014-03-16 DIAGNOSIS — Z23 Encounter for immunization: Secondary | ICD-10-CM | POA: Diagnosis not present

## 2014-03-16 DIAGNOSIS — K0889 Other specified disorders of teeth and supporting structures: Secondary | ICD-10-CM

## 2014-03-16 MED ORDER — KETOROLAC TROMETHAMINE 30 MG/ML IJ SOLN
30.0000 mg | Freq: Once | INTRAMUSCULAR | Status: AC
  Administered 2014-03-16: 30 mg via INTRAMUSCULAR

## 2014-03-16 MED ORDER — LUBIPROSTONE 8 MCG PO CAPS: 8.0000 ug | ORAL_CAPSULE | Freq: Two times a day (BID) | ORAL | Status: DC

## 2014-03-16 MED ORDER — CLINDAMYCIN HCL 300 MG PO CAPS: 300.0000 mg | ORAL_CAPSULE | Freq: Three times a day (TID) | ORAL | Status: DC

## 2014-03-16 MED ORDER — LISINOPRIL 20 MG PO TABS: 20.0000 mg | ORAL_TABLET | Freq: Every day | ORAL | Status: DC

## 2014-03-16 MED ORDER — IBUPROFEN 600 MG PO TABS
600.0000 mg | ORAL_TABLET | Freq: Three times a day (TID) | ORAL | Status: DC | PRN
Start: 2014-03-16 — End: 2014-12-18

## 2014-03-16 MED ORDER — VALACYCLOVIR HCL 1 G PO TABS: 1000.0000 mg | ORAL_TABLET | Freq: Every day | ORAL | Status: DC

## 2014-03-16 NOTE — Patient Instructions (Signed)
DASH Eating Plan DASH stands for "Dietary Approaches to Stop Hypertension." The DASH eating plan is a healthy eating plan that has been shown to reduce high blood pressure (hypertension). Additional health benefits may include reducing the risk of type 2 diabetes mellitus, heart disease, and stroke. The DASH eating plan may also help with weight loss. WHAT DO I NEED TO KNOW ABOUT THE DASH EATING PLAN? For the DASH eating plan, you will follow these general guidelines:  Choose foods with a percent daily value for sodium of less than 5% (as listed on the food label).  Use salt-free seasonings or herbs instead of table salt or sea salt.  Check with your health care provider or pharmacist before using salt substitutes.  Eat lower-sodium products, often labeled as "lower sodium" or "no salt added."  Eat fresh foods.  Eat more vegetables, fruits, and low-fat dairy products.  Choose whole grains. Look for the word "whole" as the first word in the ingredient list.  Choose fish and skinless chicken or turkey more often than red meat. Limit fish, poultry, and meat to 6 oz (170 g) each day.  Limit sweets, desserts, sugars, and sugary drinks.  Choose heart-healthy fats.  Limit cheese to 1 oz (28 g) per day.  Eat more home-cooked food and less restaurant, buffet, and fast food.  Limit fried foods.  Cook foods using methods other than frying.  Limit canned vegetables. If you do use them, rinse them well to decrease the sodium.  When eating at a restaurant, ask that your food be prepared with less salt, or no salt if possible. WHAT FOODS CAN I EAT? Seek help from a dietitian for individual calorie needs. Grains Whole grain or whole wheat bread. Brown rice. Whole grain or whole wheat pasta. Quinoa, bulgur, and whole grain cereals. Low-sodium cereals. Corn or whole wheat flour tortillas. Whole grain cornbread. Whole grain crackers. Low-sodium crackers. Vegetables Fresh or frozen vegetables  (raw, steamed, roasted, or grilled). Low-sodium or reduced-sodium tomato and vegetable juices. Low-sodium or reduced-sodium tomato sauce and paste. Low-sodium or reduced-sodium canned vegetables.  Fruits All fresh, canned (in natural juice), or frozen fruits. Meat and Other Protein Products Ground beef (85% or leaner), grass-fed beef, or beef trimmed of fat. Skinless chicken or turkey. Ground chicken or turkey. Pork trimmed of fat. All fish and seafood. Eggs. Dried beans, peas, or lentils. Unsalted nuts and seeds. Unsalted canned beans. Dairy Low-fat dairy products, such as skim or 1% milk, 2% or reduced-fat cheeses, low-fat ricotta or cottage cheese, or plain low-fat yogurt. Low-sodium or reduced-sodium cheeses. Fats and Oils Tub margarines without trans fats. Light or reduced-fat mayonnaise and salad dressings (reduced sodium). Avocado. Safflower, olive, or canola oils. Natural peanut or almond butter. Other Unsalted popcorn and pretzels. The items listed above may not be a complete list of recommended foods or beverages. Contact your dietitian for more options. WHAT FOODS ARE NOT RECOMMENDED? Grains White bread. White pasta. White rice. Refined cornbread. Bagels and croissants. Crackers that contain trans fat. Vegetables Creamed or fried vegetables. Vegetables in a cheese sauce. Regular canned vegetables. Regular canned tomato sauce and paste. Regular tomato and vegetable juices. Fruits Dried fruits. Canned fruit in light or heavy syrup. Fruit juice. Meat and Other Protein Products Fatty cuts of meat. Ribs, chicken wings, bacon, sausage, bologna, salami, chitterlings, fatback, hot dogs, bratwurst, and packaged luncheon meats. Salted nuts and seeds. Canned beans with salt. Dairy Whole or 2% milk, cream, half-and-half, and cream cheese. Whole-fat or sweetened yogurt. Full-fat   cheeses or blue cheese. Nondairy creamers and whipped toppings. Processed cheese, cheese spreads, or cheese  curds. Condiments Onion and garlic salt, seasoned salt, table salt, and sea salt. Canned and packaged gravies. Worcestershire sauce. Tartar sauce. Barbecue sauce. Teriyaki sauce. Soy sauce, including reduced sodium. Steak sauce. Fish sauce. Oyster sauce. Cocktail sauce. Horseradish. Ketchup and mustard. Meat flavorings and tenderizers. Bouillon cubes. Hot sauce. Tabasco sauce. Marinades. Taco seasonings. Relishes. Fats and Oils Butter, stick margarine, lard, shortening, ghee, and bacon fat. Coconut, palm kernel, or palm oils. Regular salad dressings. Other Pickles and olives. Salted popcorn and pretzels. The items listed above may not be a complete list of foods and beverages to avoid. Contact your dietitian for more information. WHERE CAN I FIND MORE INFORMATION? National Heart, Lung, and Blood Institute: www.nhlbi.nih.gov/health/health-topics/topics/dash/ Document Released: 02/28/2011 Document Revised: 07/26/2013 Document Reviewed: 01/13/2013 ExitCare Patient Information 2015 [employer]. This information is not intended to replace advice given to you by your health care provider. Make sure you discuss any questions you have with your health care provider. Hypertension Hypertension, commonly called high blood pressure, is when the force of blood pumping through your arteries is too strong. Your arteries are the blood vessels that carry blood from your heart throughout your body. A blood pressure reading consists of a higher number over a lower number, such as 110/72. The higher number (systolic) is the pressure inside your arteries when your heart pumps. The lower number (diastolic) is the pressure inside your arteries when your heart relaxes. Ideally you want your blood pressure below 120/80. Hypertension forces your heart to work harder to pump blood. Your arteries may become narrow or stiff. Having hypertension puts you at risk for heart disease, stroke, and other problems.  RISK  FACTORS Some risk factors for high blood pressure are controllable. Others are not.  Risk factors you cannot control include:   Race. You may be at higher risk if you are African American.  Age. Risk increases with age.  Gender. Men are at higher risk than women before age 45 years. After age 65, women are at higher risk than men. Risk factors you can control include:  Not getting enough exercise or physical activity.  Being overweight.  Getting too much fat, sugar, calories, or salt in your diet.  Drinking too much alcohol. SIGNS AND SYMPTOMS Hypertension does not usually cause signs or symptoms. Extremely high blood pressure (hypertensive crisis) may cause headache, anxiety, shortness of breath, and nosebleed. DIAGNOSIS  To check if you have hypertension, your health care provider will measure your blood pressure while you are seated, with your arm held at the level of your heart. It should be measured at least twice using the same arm. Certain conditions can cause a difference in blood pressure between your right and left arms. A blood pressure reading that is higher than normal on one occasion does not mean that you need treatment. If one blood pressure reading is high, ask your health care provider about having it checked again. TREATMENT  Treating high blood pressure includes making lifestyle changes and possibly taking medicine. Living a healthy lifestyle can help lower high blood pressure. You may need to change some of your habits. Lifestyle changes may include:  Following the DASH diet. This diet is high in fruits, vegetables, and whole grains. It is low in salt, red meat, and added sugars.  Getting at least 2 hours of brisk physical activity every week.  Losing weight if necessary.  Not smoking.  Limiting   alcoholic beverages.  Learning ways to reduce stress. If lifestyle changes are not enough to get your blood pressure under control, your health care provider may  prescribe medicine. You may need to take more than one. Work closely with your health care provider to understand the risks and benefits. HOME CARE INSTRUCTIONS  Have your blood pressure rechecked as directed by your health care provider.   Take medicines only as directed by your health care provider. Follow the directions carefully. Blood pressure medicines must be taken as prescribed. The medicine does not work as well when you skip doses. Skipping doses also puts you at risk for problems.   Do not smoke.   Monitor your blood pressure at home as directed by your health care provider. SEEK MEDICAL CARE IF:   You think you are having a reaction to medicines taken.  You have recurrent headaches or feel dizzy.  You have swelling in your ankles.  You have trouble with your vision. SEEK IMMEDIATE MEDICAL CARE IF:  You develop a severe headache or confusion.  You have unusual weakness, numbness, or feel faint.  You have severe chest or abdominal pain.  You vomit repeatedly.  You have trouble breathing. MAKE SURE YOU:   Understand these instructions.  Will watch your condition.  Will get help right away if you are not doing well or get worse. Document Released: 03/11/2005 Document Revised: 07/26/2013 Document Reviewed: 01/01/2013 ExitCare Patient Information 2015 [employer]. This information is not intended to replace advice given to you by your health care provider. Make sure you discuss any questions you have with your health care provider.  

## 2014-03-16 NOTE — Progress Notes (Signed)
Patient is here to establish care Patient states she has HTN  Patient currently taking suboxone Patient complains of constipation-BM q 2 weeks.  She reports stool softeners do not help Needs refill of valtrex

## 2014-03-16 NOTE — Progress Notes (Signed)
Patient ID: Megan Edwards, female   DOB: [birth year], 38 y.o.   MRN: [id number]     [id number]:[id number]  [id number]  DOB - 1975-08-21  CC:  Chief Complaint  Patient presents with  . Establish Care       HPI: Megan Edwards is a 38 y.o. female here today to establish medical care. She has a past medical history of depression, genital herpes, SVT, hypertension, and past polysubstance abuse.   Patient reports a left knee surgery in 2009 and at that time got hooked on pain medication which later progressed to Heroin use.  She reports that started on Suboxone and is currently still on the medication for 2 years.  She is seeing a provider with [facility] in [facility] for her Suboxone management.   The MD who put on her Suboxone has been monitoring her BP and states that it has been elevated and told her to come get checked. She reports numbers of 170-180 systolic and 100's diastolic. She reports that she may be getting switched off Suboxone due to chronic constipation.  She has tried enema, mineral oil, stool softeners, miralax, amitiza all without relief.  She states the [place] worked the best of all medications she has tried.  She reports bowel movements once weekly.   Today she c/o of toothache on right side for past three days, pain radiating to head.  Swelling of gums, no bleeding, fevers, chills, swollen lymph nodes.  Patient has No headache, No chest pain, No abdominal pain - No Nausea, No new weakness tingling or numbness, No Cough - SOB.  Allergies  Allergen Reactions  . Penicillins Rash   Past Medical History  Diagnosis Date  . Depression   . Genital herpes 2000  . Infection     UTI  . Ovarian cyst   . Abnormal Pap smear   . SVT (supraventricular tachycardia)   . Hypertension    Current Outpatient Prescriptions on File Prior to Visit  Medication Sig Dispense Refill  . buprenorphine-naloxone (SUBOXONE) 8-2 MG SUBL SL tablet Place 1-2 tablets under the tongue 2  (two) times daily. Pt takes 2 tablets (16mg ) in the morning and 1 tablet (8mg ) at 3 pm.    . Prenatal Vit-Fe Fumarate-FA (PRENATAL MULTIVITAMIN) TABS Take 1 tablet by mouth daily at 12 noon.    . valACYclovir (VALTREX) 1000 MG tablet Take 1 tablet (1,000 mg total) by mouth daily. 30 tablet 11  . ferrous sulfate 325 (65 FE) MG tablet Take 1 tablet (325 mg total) by mouth 2 (two) times daily before a meal. (Patient not taking: Reported on 03/16/2014) 60 tablet 11  . labetalol (NORMODYNE) 200 MG tablet Take 1 tablet (200 mg total) by mouth 3 (three) times daily. (Patient not taking: Reported on 03/16/2014) 90 tablet 2  . QUEtiapine (SEROQUEL) 100 MG tablet Take 100-200 mg by mouth 4 (four) times daily - after meals and at bedtime. Patient takes 100 mg 3 times daily and 200 mg at bedtime.    . traZODone (DESYREL) 100 MG tablet Take 100 mg by mouth at bedtime.     No current facility-administered medications on file prior to visit.   Family History  Problem Relation Age of Onset  . Alcoholism    . Leukemia    . Cervical cancer    . Bone cancer    . Cirrhosis    . Diabetes Mellitus II    . Depression    . Hypertension    . Thyroid cancer  History   Social History  . Marital Status: Divorced    Spouse Name: N/A    Number of Children: N/A  . Years of Education: N/A   Occupational History  . Server    Social History Main Topics  . Smoking status: Never Smoker   . Smokeless tobacco: Never Used  . Alcohol Use: No     Comment: 4 40's beer daily- not currently  . Drug Use: No     Comment:  methadone dependence  . Sexual Activity: Not Currently   Other Topics Concern  . Not on file   Social History Narrative    Review of Systems: See HPI   Objective:   Filed Vitals:   03/16/14 1610  BP: 151/87  Pulse: 84  Temp: 98.4 F (36.9 C)  Resp: 16    Physical Exam: Constitutional: Patient appears well-developed and well-nourished. No distress. HENT: Normocephalic,  atraumatic, External right and left ear normal. Oropharynx is erythematous with swelling of gums Eyes: Conjunctivae and EOM are normal. PERRLA, no scleral icterus. Neck: Normal ROM. Neck supple.  CVS: RRR, S1/S2 +, no murmurs, no gallops, no carotid bruit.  Pulmonary: Effort and breath sounds normal, no stridor, rhonchi, wheezes, rales.  Abdominal: Soft. BS +, no distension, tenderness, rebound or guarding.  Musculoskeletal: Normal range of motion. No edema and no tenderness.  Lymphadenopathy: No lymphadenopathy noted, cervical, Neuro: Alert. Normal reflexes, muscle tone coordination. Skin: Skin is warm and dry. No rash noted. Not diaphoretic. No erythema. No pallor. Psychiatric: Normal mood and affect. Behavior, judgment, thought content normal.  Lab Results  Component Value Date   WBC 16.7* 11/27/2012   HGB 8.4* 11/27/2012   HCT 24.8* 11/27/2012   MCV 76.5* 11/27/2012   PLT 153 11/27/2012   Lab Results  Component Value Date   CREATININE 0.64 11/24/2012   BUN 6 11/24/2012   NA 134* 11/24/2012   K 3.6 11/24/2012   CL 100 11/24/2012   CO2 22 11/24/2012    No results found for: HGBA1C Lipid Panel  No results found for: CHOL, TRIG, HDL, CHOLHDL, VLDL, LDLCALC     Assessment and plan:   Tresa EndoKelly was seen today for establish care.  Diagnoses and associated orders for this visit:  Pain, dental - ketorolac (TORADOL) 30 MG/ML injection 30 mg; Inject 1 mL (30 mg total) into the muscle once. - clindamycin (CLEOCIN) 300 MG capsule; Take 1 capsule (300 mg total) by mouth 3 (three) times daily. - ibuprofen (ADVIL,MOTRIN) 600 MG tablet; Take 1 tablet (600 mg total) by mouth every 8 (eight) hours as needed.  Constipation due to opioid therapy - lubiprostone (AMITIZA) 8 MCG capsule; Take 1 capsule (8 mcg total) by mouth 2 (two) times daily with a meal.  Essential hypertension - Begin lisinopril (PRINIVIL,ZESTRIL) 20 MG tablet; Take 1 tablet (20 mg total) by mouth daily.  Explained to  patient diet modifications that may contribute to elevated BP. Patient will avoid foods that are high in sodium such as  canned soups and vegetables, tomato juice, commercial baked goods, commercially prepared frozen or canned entrees and sauces. Avoid salty snacks, added salt when cooking, and substituting for low sodium herbs or spices Explained exercise regimen of cardio at least three times weekly to help lower BP and cholesterol  Genital herpes - Refill valACYclovir (VALTREX) 1000 MG tablet; Take 1 tablet (1,000 mg total) by mouth daily. Safe sex discussed     Return in about 2 weeks (around 03/30/2014) for RN visit-BP check and  Lab visit and 3 mo PCP.    Holland CommonsKECK, Natalyn Szymanowski, NP-C [facility] [phone] 03/16/2014, 4:36 PM

## 2014-04-04 ENCOUNTER — Other Ambulatory Visit: Payer: Self-pay

## 2014-04-14 ENCOUNTER — Other Ambulatory Visit: Payer: Self-pay

## 2014-05-19 ENCOUNTER — Ambulatory Visit: Payer: Medicaid Other | Attending: Internal Medicine

## 2014-05-26 ENCOUNTER — Ambulatory Visit: Payer: Self-pay | Admitting: Internal Medicine

## 2014-12-18 ENCOUNTER — Inpatient Hospital Stay (HOSPITAL_COMMUNITY)
Admission: AD | Admit: 2014-12-18 | Discharge: 2014-12-18 | Disposition: A | Discharge status: Home / Self Care | Payer: Medicaid Other | Source: Ambulatory Visit | Attending: Obstetrics and Gynecology | Admitting: Obstetrics and Gynecology

## 2014-12-18 ENCOUNTER — Encounter (HOSPITAL_COMMUNITY): Payer: Self-pay | Admitting: *Deleted

## 2014-12-18 DIAGNOSIS — O219 Vomiting of pregnancy, unspecified: Secondary | ICD-10-CM

## 2014-12-18 DIAGNOSIS — Z3A12 12 weeks gestation of pregnancy: Secondary | ICD-10-CM | POA: Diagnosis not present

## 2014-12-18 DIAGNOSIS — O4691 Antepartum hemorrhage, unspecified, first trimester: Secondary | ICD-10-CM | POA: Diagnosis not present

## 2014-12-18 DIAGNOSIS — O210 Mild hyperemesis gravidarum: Secondary | ICD-10-CM | POA: Diagnosis not present

## 2014-12-18 DIAGNOSIS — O209 Hemorrhage in early pregnancy, unspecified: Secondary | ICD-10-CM | POA: Insufficient documentation

## 2014-12-18 DIAGNOSIS — R109 Unspecified abdominal pain: Secondary | ICD-10-CM | POA: Diagnosis present

## 2014-12-18 DIAGNOSIS — O10911 Unspecified pre-existing hypertension complicating pregnancy, first trimester: Secondary | ICD-10-CM | POA: Diagnosis not present

## 2014-12-18 LAB — URINALYSIS, ROUTINE W REFLEX MICROSCOPIC
Bilirubin Urine: NEGATIVE
Glucose, UA: NEGATIVE mg/dL
KETONES UR: NEGATIVE mg/dL
LEUKOCYTES UA: NEGATIVE
NITRITE: NEGATIVE
PROTEIN: NEGATIVE mg/dL
Specific Gravity, Urine: 1.015 (ref 1.005–1.030)
UROBILINOGEN UA: 0.2 mg/dL (ref 0.0–1.0)
pH: 7.5 (ref 5.0–8.0)

## 2014-12-18 LAB — WET PREP, GENITAL
CLUE CELLS WET PREP: NONE SEEN
TRICH WET PREP: NONE SEEN
YEAST WET PREP: NONE SEEN

## 2014-12-18 LAB — URINE MICROSCOPIC-ADD ON

## 2014-12-18 LAB — CBC
HCT: 36 % (ref 36.0–46.0)
HEMOGLOBIN: 12.6 g/dL (ref 12.0–15.0)
MCH: 30.2 pg (ref 26.0–34.0)
MCHC: 35 g/dL (ref 30.0–36.0)
MCV: 86.3 fL (ref 78.0–100.0)
Platelets: 266 10*3/uL (ref 150–400)
RBC: 4.17 MIL/uL (ref 3.87–5.11)
RDW: 13.2 % (ref 11.5–15.5)
WBC: 12.7 10*3/uL — AB (ref 4.0–10.5)

## 2014-12-18 LAB — OB RESULTS CONSOLE GC/CHLAMYDIA: GC PROBE AMP, GENITAL: NEGATIVE

## 2014-12-18 MED ORDER — PROMETHAZINE HCL 25 MG PO TABS
25.0000 mg | ORAL_TABLET | Freq: Four times a day (QID) | ORAL | Status: DC | PRN
Start: 2014-12-18 — End: 2015-02-06

## 2014-12-18 MED ORDER — VALACYCLOVIR HCL 1 G PO TABS: 1000.0000 mg | ORAL_TABLET | ORAL | Status: DC | PRN

## 2014-12-18 MED ORDER — CONCEPT OB 130-92.4-1 MG PO CAPS: 1.0000 | ORAL_CAPSULE | Freq: Every day | ORAL | Status: DC

## 2014-12-18 NOTE — MAU Provider Note (Signed)
Chief Complaint: Emesis   First Provider Initiated Contact with Patient 12/18/14 [phone]      SUBJECTIVE HPI: Megan Edwards is a 39 y.o. G5P1031 at [redacted]w[redacted]d by LMP who presents to maternity admissions reporting onset of bright red bleeding that is lighter than a period this morning.  The bleeding is accompanied by light abdominal cramping that is intermittent and mild.  She also reports n/v x 3 weeks worsening in the last 2 days, and vaginal odor without noticeable discharge x 1 week.  She has her initial OB appointment with [facility] tomorrow. She denies vaginal itching/burning, urinary symptoms, h/a, dizziness, or fever/chills.    A Pos  Emesis  This is a recurrent problem. The current episode started 1 to 4 weeks ago. The problem occurs 2 to 4 times per day. The problem has been gradually worsening. The emesis has an appearance of stomach contents. There has been no fever. Associated symptoms include abdominal pain. Pertinent negatives include no chest pain, chills, diarrhea, dizziness, fever or headaches. She has tried nothing for the symptoms.  Vaginal Bleeding The patient's primary symptoms include vaginal bleeding. The patient's pertinent negatives include no pelvic pain or vaginal discharge. This is a new problem. The current episode started today. The problem occurs constantly. The problem has been unchanged. The pain is mild. She is pregnant. Associated symptoms include abdominal pain, nausea and vomiting. Pertinent negatives include no chills, constipation, diarrhea, dysuria, fever, flank pain, frequency, headaches or urgency. The vaginal bleeding is lighter than menses. She has not been passing clots. She has not been passing tissue. Nothing aggravates the symptoms. She has tried nothing for the symptoms. Her past medical history is significant for miscarriage.    Past Medical History  Diagnosis Date  . Depression   . Genital herpes 2000  . Infection     UTI  . Ovarian cyst   .  Abnormal Pap smear   . SVT (supraventricular tachycardia)   . Hypertension    Past Surgical History  Procedure Laterality Date  . Knee surgery    . Breast enhancement surgery    . Tonsillectomy    . Addenoidectomy    . Wisdom tooth extraction    . Breast surgery    . Knee surgery Left    Social History   Social History  . Marital Status: Divorced    Spouse Name: N/A  . Number of Children: N/A  . Years of Education: N/A   Occupational History  . Server    Social History Main Topics  . Smoking status: Never Smoker   . Smokeless tobacco: Never Used  . Alcohol Use: No     Comment: 4 40's beer daily- not currently  . Drug Use: No     Comment:  methadone dependence  . Sexual Activity: Not Currently   Other Topics Concern  . Not on file   Social History Narrative   No current facility-administered medications on file prior to encounter.   Current Outpatient Prescriptions on File Prior to Encounter  Medication Sig Dispense Refill  . clindamycin (CLEOCIN) 300 MG capsule Take 1 capsule (300 mg total) by mouth 3 (three) times daily. (Patient not taking: Reported on 12/18/2014) 21 capsule 0  . ferrous sulfate 325 (65 FE) MG tablet Take 1 tablet (325 mg total) by mouth 2 (two) times daily before a meal. (Patient not taking: Reported on 03/16/2014) 60 tablet 11  . ibuprofen (ADVIL,MOTRIN) 600 MG tablet Take 1 tablet (600 mg total) by mouth every  8 (eight) hours as needed. (Patient not taking: Reported on 12/18/2014) 30 tablet 1  . labetalol (NORMODYNE) 200 MG tablet Take 1 tablet (200 mg total) by mouth 3 (three) times daily. (Patient not taking: Reported on 03/16/2014) 90 tablet 2  . lisinopril (PRINIVIL,ZESTRIL) 20 MG tablet Take 1 tablet (20 mg total) by mouth daily. (Patient not taking: Reported on 12/18/2014) 30 tablet 1  . lubiprostone (AMITIZA) 8 MCG capsule Take 1 capsule (8 mcg total) by mouth 2 (two) times daily with a meal. (Patient not taking: Reported on 12/18/2014) 60  capsule 1  . valACYclovir (VALTREX) 1000 MG tablet Take 1 tablet (1,000 mg total) by mouth daily. (Patient taking differently: Take 1,000 mg by mouth as needed (with flare ups). ) 30 tablet 3   Allergies  Allergen Reactions  . Penicillins Rash    Has patient had a PCN reaction causing immediate rash, facial/tongue/throat swelling, SOB or lightheadedness with hypotension: Yes Has patient had a PCN reaction causing severe rash involving mucus membranes or skin necrosis: No Has patient had a PCN reaction that required hospitalization No Has patient had a PCN reaction occurring within the last 10 years: No If all of the above answers are "NO", then may proceed with Cephalosporin use.    ROS:  Review of Systems  Constitutional: Negative for fever, chills and fatigue.  HENT: Negative for sinus pressure.   Eyes: Negative for photophobia.  Respiratory: Negative for shortness of breath.   Cardiovascular: Negative for chest pain.  Gastrointestinal: Positive for nausea, vomiting and abdominal pain. Negative for diarrhea and constipation.  Genitourinary: Positive for vaginal bleeding. Negative for dysuria, urgency, frequency, flank pain, vaginal discharge, difficulty urinating, vaginal pain and pelvic pain.  Musculoskeletal: Negative for neck pain.  Neurological: Negative for dizziness, weakness and headaches.  Psychiatric/Behavioral: Negative.      I have reviewed patient's Past Medical Hx, Surgical Hx, Family Hx, Social Hx, medications and allergies.   Physical Exam   Patient Vitals for the past 24 hrs:  BP Temp Pulse Resp  12/18/14 0948 106/66 mmHg - 76 18  12/18/14 0940 106/66 mmHg - 76 18  12/18/14 0735 146/88 mmHg 98.2 F (36.8 C) 107 18   Constitutional: Well-developed, well-nourished female in no acute distress.  Cardiovascular: normal rate Respiratory: normal effort GI: Abd soft, non-tender. Pos BS x 4 MS: Extremities nontender, no edema, normal ROM Neurologic: Alert and  oriented x 4.  GU: Neg CVAT.  PELVIC EXAM: Cervix pink, visually closed, without lesion, small amount light red bleeding, vaginal walls and external genitalia normal Bimanual exam: Cervix 0/long/high, firm, anterior, neg CMT, uterus nontender, nonenlarged, adnexa without tenderness, enlargement, or mass  FHT 159 by doppler  Results for orders placed or performed during the hospital encounter of 12/18/14 (from the past 24 hour(s))  CBC     Status: Abnormal   Collection Time: 12/18/14  7:50 AM  Result Value Ref Range   WBC 12.7 (H) 4.0 - 10.5 K/uL   RBC 4.17 3.87 - 5.11 MIL/uL   Hemoglobin 12.6 12.0 - 15.0 g/dL   HCT 16.1 09.6 - 04.5 %   MCV 86.3 78.0 - 100.0 fL   MCH 30.2 26.0 - 34.0 pg   MCHC 35.0 30.0 - 36.0 g/dL   RDW 40.9 81.1 - 91.4 %   Platelets 266 150 - 400 K/uL  Wet prep, genital     Status: Abnormal   Collection Time: 12/18/14  7:55 AM  Result Value Ref Range   Yeast Wet  Prep HPF POC NONE SEEN NONE SEEN   Trich, Wet Prep NONE SEEN NONE SEEN   Clue Cells Wet Prep HPF POC NONE SEEN NONE SEEN   WBC, Wet Prep HPF POC FEW (A) NONE SEEN  Urinalysis, Routine w reflex microscopic (not at [facility])     Status: Abnormal   Collection Time: 12/18/14  8:27 AM  Result Value Ref Range   Color, Urine YELLOW YELLOW   APPearance CLEAR CLEAR   Specific Gravity, Urine 1.015 1.005 - 1.030   pH 7.5 5.0 - 8.0   Glucose, UA NEGATIVE NEGATIVE mg/dL   Hgb urine dipstick LARGE (A) NEGATIVE   Bilirubin Urine NEGATIVE NEGATIVE   Ketones, ur NEGATIVE NEGATIVE mg/dL   Protein, ur NEGATIVE NEGATIVE mg/dL   Urobilinogen, UA 0.2 0.0 - 1.0 mg/dL   Nitrite NEGATIVE NEGATIVE   Leukocytes, UA NEGATIVE NEGATIVE  Urine microscopic-add on     Status: Abnormal   Collection Time: 12/18/14  8:27 AM  Result Value Ref Range   Squamous Epithelial / LPF FEW (A) RARE   WBC, UA 0-2 <3 WBC/hpf   RBC / HPF 3-6 <3 RBC/hpf   Bacteria, UA FEW (A) RARE   Urine-Other MUCOUS PRESENT      MAU course U/A, CBC, wet  prep, GCC, HIV pending  Report to [place], CNM  Dorathy Kinsman, CNM seen care of patient at 8:10 AM. Labs pending.  MDM -39 year old female at 12 weeks and 2 days gestation with painless light vaginal bleeding, positive fetal heart tones closed cervix. Etiology unclear, but pregnancy stable. -Onset of bleeding coincided with frequent vomiting. No active vomiting or dehydration present during MAU visit. Rx Phenergan given.  -Initial BP mildly elevated, but repeat normal. No need to restart blood pressure medicines now, but instructed that she'll need to discuss this with her OB/GYN.  ASSESSMENT 1. Vaginal bleeding in pregnancy, first trimester   2. Chronic hypertension in pregnancy, first trimester   3. Nausea and vomiting during pregnancy prior to [redacted] weeks gestation    PLAN Discharge home in stable condition. Bleeding precautions. Pelvic rest 1 week. GC/Chlamydia cultures pending. Discussed blood pressure medicine at new OB visit Follow-up Information    Follow up with HARPER,CHARLES A, MD On 12/19/2014.   Specialty:  Obstetrics and Gynecology   Why:  Start prenatal care   Contact information:   [address] [place] [address] [phone]       Follow up with [facility] [place] [facility] ADMISS[facility].   Why:  As needed in emergencies   Contact information:   [address] [id number] mc [place] [address] [phone]         Medication List    STOP taking these medications        clindamycin 300 MG capsule  Commonly known as:  CLEOCIN     ferrous sulfate 325 (65 FE) MG tablet     ibuprofen 600 MG tablet  Commonly known as:  ADVIL,MOTRIN     labetalol 200 MG tablet  Commonly known as:  NORMODYNE     lisinopril 20 MG tablet  Commonly known as:  PRINIVIL,ZESTRIL     lubiprostone 8 MCG capsule  Commonly known as:  AMITIZA      TAKE these medications        buprenorphine 2 MG Subl SL tablet   Commonly known as:  SUBUTEX  Place 28 mg under the tongue daily.     CONCEPT OB 130-92.4-1 MG  Caps  Take 1 tablet by mouth daily.     lansoprazole 15 MG capsule  Commonly known as:  PREVACID  Take 15 mg by mouth daily at 12 noon.     promethazine 25 MG tablet  Commonly known as:  PHENERGAN  Take 1 tablet (25 mg total) by mouth every 6 (six) hours as needed.     valACYclovir 1000 MG tablet  Commonly known as:  VALTREX  Take 1 tablet (1,000 mg total) by mouth as needed (with flare ups).        Sharen Counter Certified Nurse-Midwife 12/18/2014  7:54 AM  Dorathy Kinsman, CNM 12/18/2014 11:08 AM

## 2014-12-18 NOTE — Discharge Instructions (Signed)
Vaginal Bleeding During Pregnancy, First Trimester  A small amount of bleeding (spotting) from the vagina is relatively common in early pregnancy. It usually stops on its own. Various things may cause bleeding or spotting in early pregnancy. Some bleeding may be related to the pregnancy, and some may not. In most cases, the bleeding is normal and is not a problem. However, bleeding can also be a sign of something serious. Be sure to tell your health care provider about any vaginal bleeding right away.  Some possible causes of vaginal bleeding during the first trimester include:  · Infection or inflammation of the cervix.  · Growths (polyps) on the cervix.  · Miscarriage or threatened miscarriage.  · Pregnancy tissue has developed outside of the uterus and in a fallopian tube (tubal pregnancy).  · Tiny cysts have developed in the uterus instead of pregnancy tissue (molar pregnancy).  HOME CARE INSTRUCTIONS   Watch your condition for any changes. The following actions may help to lessen any discomfort you are feeling:  · Follow your health care provider's instructions for limiting your activity. If your health care provider orders bed rest, you may need to stay in bed and only get up to use the bathroom. However, your health care provider may allow you to continue light activity.  · If needed, make plans for someone to help with your regular activities and responsibilities while you are on bed rest.  · Keep track of the number of pads you use each day, how often you change pads, and how soaked (saturated) they are. Write this down.  · Do not use tampons. Do not douche.  · Do not have sexual intercourse or orgasms until approved by your health care provider.  · If you pass any tissue from your vagina, save the tissue so you can show it to your health care provider.  · Only take over-the-counter or prescription medicines as directed by your health care provider.  · Do not take aspirin because it can make you  bleed.  · Keep all follow-up appointments as directed by your health care provider.  SEEK MEDICAL CARE IF:  · You have any vaginal bleeding during any part of your pregnancy.  · You have cramps or labor pains.  · You have a fever, not controlled by medicine.  SEEK IMMEDIATE MEDICAL CARE IF:   · You have severe cramps in your back or belly (abdomen).  · You pass large clots or tissue from your vagina.  · Your bleeding increases.  · You feel light-headed or weak, or you have fainting episodes.  · You have chills.  · You are leaking fluid or have a gush of fluid from your vagina.  · You pass out while having a bowel movement.  MAKE SURE YOU:  · Understand these instructions.  · Will watch your condition.  · Will get help right away if you are not doing well or get worse.  Document Released: 12/19/2004 Document Revised: 03/16/2013 Document Reviewed: 11/16/2012  ExitCare® Patient Information ©2015 [employer]. This information is not intended to replace advice given to you by your health care provider. Make sure you discuss any questions you have with your health care provider.

## 2014-12-18 NOTE — MAU Note (Signed)
Pt presents to MAU with complaints of nausea and vaginal bleeding. Reports lower abdominal cramping.

## 2014-12-19 ENCOUNTER — Encounter: Payer: Self-pay | Admitting: Obstetrics

## 2014-12-19 ENCOUNTER — Ambulatory Visit (INDEPENDENT_AMBULATORY_CARE_PROVIDER_SITE_OTHER): Payer: Medicaid Other | Admitting: Obstetrics

## 2014-12-19 VITALS — BP 124/86 | HR 90 | Temp 98.8°F | Wt 206.0 lb

## 2014-12-19 DIAGNOSIS — O161 Unspecified maternal hypertension, first trimester: Secondary | ICD-10-CM | POA: Diagnosis not present

## 2014-12-19 DIAGNOSIS — Z3482 Encounter for supervision of other normal pregnancy, second trimester: Secondary | ICD-10-CM

## 2014-12-19 DIAGNOSIS — F1910 Other psychoactive substance abuse, uncomplicated: Principal | ICD-10-CM

## 2014-12-19 DIAGNOSIS — O09521 Supervision of elderly multigravida, first trimester: Secondary | ICD-10-CM

## 2014-12-19 DIAGNOSIS — O99321 Drug use complicating pregnancy, first trimester: Secondary | ICD-10-CM | POA: Diagnosis not present

## 2014-12-19 LAB — POCT URINALYSIS DIPSTICK
Bilirubin, UA: NEGATIVE
GLUCOSE UA: NEGATIVE
KETONES UA: NEGATIVE
Leukocytes, UA: NEGATIVE
Nitrite, UA: NEGATIVE
Protein, UA: NEGATIVE
RBC UA: 250
SPEC GRAV UA: 1.01
UROBILINOGEN UA: NEGATIVE
pH, UA: 7

## 2014-12-19 LAB — GC/CHLAMYDIA PROBE AMP (~~LOC~~) NOT AT ARMC
CHLAMYDIA, DNA PROBE: NEGATIVE
NEISSERIA GONORRHEA: NEGATIVE

## 2014-12-19 LAB — HIV ANTIBODY (ROUTINE TESTING W REFLEX): HIV Screen 4th Generation wRfx: NONREACTIVE

## 2014-12-19 MED ORDER — LABETALOL HCL 200 MG PO TABS: 200.0000 mg | ORAL_TABLET | Freq: Three times a day (TID) | ORAL | Status: DC

## 2014-12-19 NOTE — Addendum Note (Signed)
Addended by: Marya Landry D on: 12/19/2014 04:18 PM   Modules accepted: Orders

## 2014-12-19 NOTE — Progress Notes (Addendum)
Subjective:    Megan Edwards is being seen today for her first obstetrical visit.  This is not a planned pregnancy. She is at [redacted]w[redacted]d gestation. Her obstetrical history is significant for AMA, substance abuse, HTN, obesity, bipolar disorder. Relationship with FOB: significant other, not living together. Patient does not intend to breast feed. Pregnancy history fully reviewed.  The information documented in the HPI was reviewed and verified.  Menstrual History: OB History    Gravida Para Term Preterm AB TAB SAB Ectopic Multiple Living   0 0 0 1       Patient's last menstrual period was 09/23/2014.    Past Medical History  Diagnosis Date  . Depression   . Genital herpes 2000  . Infection     UTI  . Ovarian cyst   . Abnormal Pap smear   . SVT (supraventricular tachycardia)   . Hypertension     Past Surgical History  Procedure Laterality Date  . Knee surgery    . Breast enhancement surgery    . Tonsillectomy    . Addenoidectomy    . Wisdom tooth extraction    . Breast surgery    . Knee surgery Left      (Not in a hospital admission) Allergies  Allergen Reactions  . Penicillins Rash    Has patient had a PCN reaction causing immediate rash, facial/tongue/throat swelling, SOB or lightheadedness with hypotension: Yes Has patient had a PCN reaction causing severe rash involving mucus membranes or skin necrosis: No Has patient had a PCN reaction that required hospitalization No Has patient had a PCN reaction occurring within the last 10 years: No If all of the above answers are "NO", then may proceed with Cephalosporin use.    Social History  Substance Use Topics  . Smoking status: Never Smoker   . Smokeless tobacco: Never Used  . Alcohol Use: No     Comment: 4 40's beer daily- not currently    Family History  Problem Relation Age of Onset  . Alcoholism    . Leukemia    . Cervical cancer    . Bone cancer    . Cirrhosis    . Diabetes Mellitus II    .  Depression    . Hypertension    . Thyroid cancer       Review of Systems Constitutional: negative for weight loss Gastrointestinal: negative for vomiting Genitourinary:negative for genital lesions and vaginal discharge and dysuria Musculoskeletal:negative for back pain Behavioral/Psych: negative for abusive relationship, depression, illegal drug usage and tobacco use    Objective:    BP 124/86 mmHg  Pulse 90  Temp(Src) 98.8 F (37.1 C)  Wt 206 lb (93.441 kg)  LMP 09/23/2014 General Appearance:    Alert, cooperative, no distress, appears stated age  Head:    Normocephalic, without obvious abnormality, atraumatic  Eyes:    PERRL, conjunctiva/corneas clear, EOM's intact, fundi    benign, both eyes  Ears:    Normal TM's and external ear canals, both ears  Nose:   Nares normal, septum midline, mucosa normal, no drainage    or sinus tenderness  Throat:   Lips, mucosa, and tongue normal; teeth and gums normal  Neck:   Supple, symmetrical, trachea midline, no adenopathy;    thyroid:  no enlargement/tenderness/nodules; no carotid   bruit or JVD  Back:     Symmetric, no curvature, ROM normal, no CVA tenderness  Lungs:     Clear to  auscultation bilaterally, respirations unlabored  Chest Wall:    No tenderness or deformity   Heart:    Regular rate and rhythm, S1 and S2 normal, no murmur, rub   or gallop  Breast Exam:    No tenderness, masses, or nipple abnormality  Abdomen:     Soft, non-tender, bowel sounds active all four quadrants,    no masses, no organomegaly  Genitalia:    Normal female without lesion, discharge or tenderness  Extremities:   Extremities normal, atraumatic, no cyanosis or edema  Pulses:   2+ and symmetric all extremities  Skin:   Skin color, texture, turgor normal, no rashes or lesions  Lymph nodes:   Cervical, supraclavicular, and axillary nodes normal  Neurologic:   CNII-XII intact, normal strength, sensation and reflexes    throughout      Lab Review Urine  pregnancy test Labs reviewed yes Radiologic studies reviewed no Assessment:    Pregnancy at [redacted]w[redacted]d weeks    AMA  Substance abuse, on Subutex  HTN, on Labetalol  Bipolar Disorder, on Seroquel  H/O SVT   Plan:      Prenatal vitamins.  Counseling provided regarding continued use of seat belts, cessation of alcohol consumption, smoking or use of illicit drugs; infection precautions i.e., influenza/TDAP immunizations, toxoplasmosis,CMV, parvovirus, listeria and varicella; workplace safety, exercise during pregnancy; routine dental care, safe medications, sexual activity, hot tubs, saunas, pools, travel, caffeine use, fish and methlymercury, potential toxins, hair treatments, varicose veins Weight gain recommendations per IOM guidelines reviewed: underweight/BMI< 18.5--> gain 28 - 40 lbs; normal weight/BMI 18.5 - 24.9--> gain 25 - 35 lbs; overweight/BMI 25 - 29.9--> gain 15 - 25 lbs; obese/BMI >30->gain  11 - 20 lbs Problem list reviewed and updated. FIRST/CF mutation testing/NIPT/QUAD SCREEN/fragile X/Ashkenazi Jewish population testing/Spinal muscular atrophy discussed: requested. Role of ultrasound in pregnancy discussed; fetal survey: requested. Amniocentesis discussed: undecided.  Referred to MFM F/U in 4 weeks  Meds ordered this encounter  Medications  . labetalol (NORMODYNE) 200 MG tablet    Sig: Take 1 tablet (200 mg total) by mouth 3 (three) times daily.    Dispense:  90 tablet    Refill:  5   Orders Placed This Encounter  Procedures  . Culture, OB Urine  . US OB Comp Less 14 Wks    Standing Status: Future     Number of Occurrences:      Standing Expiration Date: 02/18/2016    Order Specific Question:  Reason for Exam (SYMPTOM  OR DIAGNOSIS REQUIRED)    Answer:  AMA    Order Specific Question:  Preferred imaging location?    Answer:  [facility]  . US Fetal Nuchal Translucency Measurement    Standing Status: Future     Number of Occurrences:      Standing  Expiration Date: 02/18/2016    Order Specific Question:  Reason for Exam (SYMPTOM  OR DIAGNOSIS REQUIRED)    Answer:  AMA    Order Specific Question:  Preferred imaging location?    Answer:  [facility]  . Obstetric panel  . HIV antibody  . Hemoglobinopathy evaluation  . Varicella zoster antibody, IgG  . Vit D  25 hydroxy (rtn osteoporosis monitoring)  . TSH  . AMB Referral to Maternal Fetal Medicine (MFM)    Referral Priority:  Routine    Referral Type:  Consultation    Number of Visits Requested:  1

## 2014-12-20 LAB — OBSTETRIC PANEL
ANTIBODY SCREEN: NEGATIVE
Basophils Absolute: 0 10*3/uL (ref 0.0–0.1)
Basophils Relative: 0 % (ref 0–1)
EOS PCT: 1 % (ref 0–5)
Eosinophils Absolute: 0.1 10*3/uL (ref 0.0–0.7)
HCT: 36.1 % (ref 36.0–46.0)
Hemoglobin: 12.8 g/dL (ref 12.0–15.0)
Hepatitis B Surface Ag: NEGATIVE
LYMPHS ABS: 2.3 10*3/uL (ref 0.7–4.0)
LYMPHS PCT: 19 % (ref 12–46)
MCH: 30.5 pg (ref 26.0–34.0)
MCHC: 35.5 g/dL (ref 30.0–36.0)
MCV: 86 fL (ref 78.0–100.0)
MONO ABS: 0.6 10*3/uL (ref 0.1–1.0)
MONOS PCT: 5 % (ref 3–12)
MPV: 9 fL (ref 8.6–12.4)
Neutro Abs: 9.1 10*3/uL — ABNORMAL HIGH (ref 1.7–7.7)
Neutrophils Relative %: 75 % (ref 43–77)
PLATELETS: 254 10*3/uL (ref 150–400)
RBC: 4.2 MIL/uL (ref 3.87–5.11)
RDW: 13.7 % (ref 11.5–15.5)
RH TYPE: POSITIVE
RUBELLA: 3.35 {index} — AB (ref <0.90)
WBC: 12.1 10*3/uL — AB (ref 4.0–10.5)

## 2014-12-20 LAB — CULTURE, OB URINE

## 2014-12-20 LAB — HIV ANTIBODY (ROUTINE TESTING W REFLEX): HIV: NONREACTIVE

## 2014-12-20 LAB — VARICELLA ZOSTER ANTIBODY, IGG: Varicella IgG: 2671 Index — ABNORMAL HIGH (ref <135.00)

## 2014-12-20 LAB — VITAMIN D 25 HYDROXY (VIT D DEFICIENCY, FRACTURES): Vit D, 25-Hydroxy: 15 ng/mL — ABNORMAL LOW (ref 30–100)

## 2014-12-20 LAB — TSH: TSH: 3.719 u[IU]/mL (ref 0.350–4.500)

## 2014-12-21 LAB — HEMOGLOBINOPATHY EVALUATION
HGB F QUANT: 0 % (ref 0.0–2.0)
Hemoglobin Other: 0 %
Hgb A2 Quant: 2.5 % (ref 2.2–3.2)
Hgb A: 97.5 % (ref 96.8–97.8)
Hgb S Quant: 0 %

## 2014-12-22 LAB — PAP, TP IMAGING W/ HPV RNA, RFLX HPV TYPE 16,18/45: HPV mRNA, High Risk: NOT DETECTED

## 2014-12-27 ENCOUNTER — Other Ambulatory Visit: Payer: Self-pay | Admitting: Obstetrics

## 2014-12-27 ENCOUNTER — Ambulatory Visit (HOSPITAL_COMMUNITY): Admission: RE | Admit: 2014-12-27 | Payer: Medicaid Other | Source: Ambulatory Visit

## 2014-12-27 ENCOUNTER — Ambulatory Visit (HOSPITAL_COMMUNITY)
Admission: RE | Admit: 2014-12-27 | Discharge: 2014-12-27 | Disposition: A | Discharge status: Home / Self Care | Payer: Medicaid Other | Source: Ambulatory Visit | Attending: Obstetrics | Admitting: Obstetrics

## 2014-12-27 ENCOUNTER — Encounter (HOSPITAL_COMMUNITY): Payer: Self-pay

## 2014-12-27 DIAGNOSIS — O09522 Supervision of elderly multigravida, second trimester: Secondary | ICD-10-CM

## 2014-12-27 DIAGNOSIS — O09521 Supervision of elderly multigravida, first trimester: Secondary | ICD-10-CM | POA: Diagnosis not present

## 2014-12-27 DIAGNOSIS — O99322 Drug use complicating pregnancy, second trimester: Secondary | ICD-10-CM

## 2014-12-27 DIAGNOSIS — Z369 Encounter for antenatal screening, unspecified: Secondary | ICD-10-CM

## 2014-12-27 DIAGNOSIS — F1910 Other psychoactive substance abuse, uncomplicated: Secondary | ICD-10-CM

## 2014-12-27 DIAGNOSIS — B009 Herpesviral infection, unspecified: Secondary | ICD-10-CM

## 2014-12-27 DIAGNOSIS — O99321 Drug use complicating pregnancy, first trimester: Secondary | ICD-10-CM | POA: Diagnosis present

## 2014-12-27 DIAGNOSIS — Z3687 Encounter for antenatal screening for uncertain dates: Secondary | ICD-10-CM

## 2014-12-27 DIAGNOSIS — O162 Unspecified maternal hypertension, second trimester: Secondary | ICD-10-CM

## 2014-12-27 DIAGNOSIS — O161 Unspecified maternal hypertension, first trimester: Secondary | ICD-10-CM | POA: Diagnosis not present

## 2014-12-27 DIAGNOSIS — Z3689 Encounter for other specified antenatal screening: Secondary | ICD-10-CM

## 2014-12-27 DIAGNOSIS — O09512 Supervision of elderly primigravida, second trimester: Secondary | ICD-10-CM

## 2014-12-27 DIAGNOSIS — Z3A15 15 weeks gestation of pregnancy: Secondary | ICD-10-CM

## 2014-12-27 DIAGNOSIS — Z3A13 13 weeks gestation of pregnancy: Secondary | ICD-10-CM | POA: Diagnosis not present

## 2014-12-27 NOTE — Progress Notes (Signed)
Genetic Counseling  High-Risk Gestation Note  Appointment Date:  12/27/2014 Referred By: Coral Ceo, MD Date of Birth:  [birth year]   Pregnancy History: [id number] Estimated Date of Delivery: 06/19/15 Estimated Gestational Age: [redacted]w[redacted]d Attending: Damaris Hippo, MD  Megan Edwards and her partner were seen for genetic counseling because of a maternal age of 39 y.o..     In Summary:   Discussed approximate 1 in 45 risk for fetal aneuploidy related to maternal age of 39 years old  Ultrasound today changed estimated date of delivery  Father of the pregnancy reported that he is 39 years old. Discussed associations with advanced paternal age (APA) and offered third trimester growth ultrasound  Couple plan to consider Quad, NIPS, and amniocentesis further, but indicated not likely interested in amniocentesis  They declined pursuing any screening or testing at today's visit given their time constraints today  Follow-up ultrasound is scheduled for 01/24/15  They were counseled regarding maternal age and the association with risk for chromosome conditions due to nondisjunction with aging of the ova.   We reviewed chromosomes, nondisjunction, and the associated 1 in 31 risk for fetal aneuploidy related to a maternal age of 39 y.o. at [redacted]w[redacted]d gestation.  They were counseled that the risk for aneuploidy decreases as gestational age increases, accounting for those pregnancies which spontaneously abort.  We specifically discussed Down syndrome (trisomy 42), trisomies 77 and 1, and sex chromosome aneuploidies (47,XXX and 47,XXY) including the common features and prognoses of each.   We reviewed available screening options including Quad screen, noninvasive prenatal screening (NIPS)/prenatal cell free DNA (cfDNA) testing, and detailed ultrasound. They were counseled that screening tests are used to modify a patient's a priori risk for aneuploidy, typically based on age. This estimate provides a  pregnancy specific risk assessment. We reviewed the benefits and limitations of each option. Specifically, we discussed the conditions for which each test screens, the detection rates, and false positive rates of each. They were also counseled regarding diagnostic testing via amniocentesis. We reviewed the approximate 1 in 300-500 risk for complications for amniocentesis, including spontaneous pregnancy loss. They declined pursuing screening or testing today, (including Quad, NIPS and amniocentesis) and planned to further discuss these options together.    A complete ultrasound was performed today. The ultrasound report will be sent under separate cover. They understand that screening tests cannot rule out all birth defects or genetic syndromes. The patient was advised of this limitation and states she still does not want additional testing at this time.   The father of the pregnancy reported that he is 39 years old. This couple was counseled that advanced paternal age (APA) is defined as paternal age greater than or equal to age 35.  Recent large-scale sequencing studies have shown that approximately 80% of de novo point mutations are of paternal origin.  Many studies have demonstrated a strong correlation between increased paternal age and de novo point mutations.  Although no specific data is available regarding fetal risks for fathers 65+ years old at conception, it is apparent that the overall risk for single gene conditions is increased.  To estimate the relative increase in risk of a genetic disorder with APA, the heritability of the disease must be considered.  Assuming an approximate 2x increase in risk for conditions that are exclusively paternal in origin, the risk for each individual condition is still relatively low.  It is estimated that the overall chance for a de novo mutation is ~0.5%.  We also discussed  the wide range of conditions which can be caused by new dominant gene mutations  (achondroplasia, neurofibromatosis, Marfan syndrome etc.).  They were counseled that genetic testing for each individual single gene condition is not warranted or available unless ultrasound or family history concerns lend suspicion to a specific condition.  We discussed the recommendation for a detailed ultrasound in the second trimester and a follow up ultrasound at ~28 weeks to monitor fetal growth.  [name]'s family history was obtained previously during her 38 genetic counseling visit. Family history was not updated today and family history was not obtained for the father of this pregnancy today given the couple's time constraints and that genetic counseling was not originally scheduled for today. Without further information regarding the provided family history, an accurate genetic risk cannot be calculated. Further genetic counseling is warranted if more information is obtained.  Pregnancy and medical history were not reviewed with Megan Edwards at the time of today's visit given the patient's time constraints.   I counseled this couple regarding the above risks and available options.  The approximate face-to-face time with the genetic counselor was 25 minutes.  Quinn Plowman, MS,  Certified Genetic Counselor 12/27/2014

## 2015-01-16 ENCOUNTER — Encounter: Payer: Medicaid Other | Admitting: Obstetrics

## 2015-01-24 ENCOUNTER — Ambulatory Visit (HOSPITAL_COMMUNITY): Payer: Medicaid Other | Attending: Obstetrics

## 2015-02-06 ENCOUNTER — Ambulatory Visit (INDEPENDENT_AMBULATORY_CARE_PROVIDER_SITE_OTHER): Payer: Medicaid Other | Admitting: Obstetrics

## 2015-02-06 VITALS — BP 138/83 | HR 108 | Wt 208.8 lb

## 2015-02-06 DIAGNOSIS — K219 Gastro-esophageal reflux disease without esophagitis: Secondary | ICD-10-CM

## 2015-02-06 DIAGNOSIS — O209 Hemorrhage in early pregnancy, unspecified: Secondary | ICD-10-CM

## 2015-02-06 DIAGNOSIS — O4691 Antepartum hemorrhage, unspecified, first trimester: Secondary | ICD-10-CM

## 2015-02-06 DIAGNOSIS — Z3482 Encounter for supervision of other normal pregnancy, second trimester: Secondary | ICD-10-CM

## 2015-02-06 DIAGNOSIS — O219 Vomiting of pregnancy, unspecified: Secondary | ICD-10-CM

## 2015-02-06 LAB — POCT URINALYSIS DIPSTICK
BILIRUBIN UA: NEGATIVE
GLUCOSE UA: NEGATIVE
Ketones, UA: NEGATIVE
Leukocytes, UA: NEGATIVE
NITRITE UA: NEGATIVE
Protein, UA: NEGATIVE
RBC UA: NEGATIVE
Spec Grav, UA: 1.01
Urobilinogen, UA: NEGATIVE
pH, UA: 6.5

## 2015-02-06 MED ORDER — PROMETHAZINE HCL 25 MG PO TABS: 25.0000 mg | ORAL_TABLET | Freq: Four times a day (QID) | ORAL | Status: DC | PRN

## 2015-02-06 MED ORDER — OMEPRAZOLE 20 MG PO CPDR: 20.0000 mg | DELAYED_RELEASE_CAPSULE | Freq: Two times a day (BID) | ORAL | Status: DC

## 2015-02-06 NOTE — Progress Notes (Signed)
Pt states that she is still having N&V.  Pt states that she does not have much of an appetite. Pt states that she is very constipated.

## 2015-02-07 ENCOUNTER — Encounter: Payer: Self-pay | Admitting: Obstetrics

## 2015-02-07 NOTE — Progress Notes (Signed)
Subjective:    Megan Edwards is a 39 y.o. female being seen today for her obstetrical visit. She is at 2367w1d gestation. Patient reports: no complaints . Fetal movement: normal.  Problem List Items Addressed This Visit    None    Visit Diagnoses    Encounter for supervision of other normal pregnancy in second trimester    -  Primary    Relevant Orders    POCT urinalysis dipstick (Completed)    Vaginal bleeding in pregnancy, first trimester        Relevant Medications    promethazine (PHENERGAN) 25 MG tablet    Nausea and vomiting during pregnancy prior to [redacted] weeks gestation        Relevant Medications    promethazine (PHENERGAN) 25 MG tablet    GERD without esophagitis        Relevant Medications    omeprazole (PRILOSEC) 20 MG capsule      Patient Active Problem List   Diagnosis Date Noted  . Normal delivery 11/28/2012  . Immediate postpartum hemorrhage, with delivery 11/28/2012  . Maternal anemia complicating pregnancy, childbirth, or the puerperium 11/28/2012  . Pregnancy induced hypertension 11/25/2012  . Elderly primigravida, antepartum 06/26/2012  . Methadone dependence (HCC) 08/14/2011  . Polysubstance (excluding opioids) dependence (HCC) 08/11/2011   Objective:    BP 138/83 mmHg  Pulse 108  Wt 208 lb 12.8 oz (94.711 kg)  LMP 09/23/2014 FHT: 150 BPM  Uterine Size: size equals dates     Assessment:    Pregnancy @ 3667w1d    Plan:      Labs, problem list reviewed and updated 2 hr GTT planned Follow up in 4 weeks.

## 2015-02-24 ENCOUNTER — Other Ambulatory Visit: Payer: Self-pay | Admitting: Certified Nurse Midwife

## 2015-03-06 ENCOUNTER — Encounter: Payer: Medicaid Other | Admitting: Obstetrics

## 2015-03-22 ENCOUNTER — Other Ambulatory Visit: Payer: Self-pay | Admitting: Obstetrics

## 2015-03-22 NOTE — Telephone Encounter (Signed)
Please review for refill.  

## 2015-03-26 NOTE — L&D Delivery Note (Signed)
Delivery Note At 9:23 PM a viable female was delivered via Vaginal, Spontaneous Delivery (Presentation: Right Occiput Anterior).  APGAR: 8, 9; weight 6 lb 14.4 oz (3130 g).   Placenta status: Intact, Spontaneous.  Cord: 3 vessels with the following complications: None.  Cord pH: none  Anesthesia: Epidural  Episiotomy: None Lacerations: 1st degree;Perineal Suture Repair: 2.0 chromic Est. Blood Loss (mL): 100  Mom to postpartum.  Baby to Couplet care / Skin to Skin.  HARPER,CHARLES A 06/12/2015, 11:11 PM

## 2015-04-05 ENCOUNTER — Encounter: Payer: Medicaid Other | Admitting: Obstetrics

## 2015-04-11 ENCOUNTER — Ambulatory Visit (INDEPENDENT_AMBULATORY_CARE_PROVIDER_SITE_OTHER): Payer: Medicaid Other | Admitting: Obstetrics

## 2015-04-11 ENCOUNTER — Encounter: Payer: Self-pay | Admitting: Obstetrics

## 2015-04-11 VITALS — BP 146/80 | HR 93 | Temp 97.8°F | Wt 212.0 lb

## 2015-04-11 DIAGNOSIS — O163 Unspecified maternal hypertension, third trimester: Secondary | ICD-10-CM

## 2015-04-11 DIAGNOSIS — O4691 Antepartum hemorrhage, unspecified, first trimester: Secondary | ICD-10-CM

## 2015-04-11 DIAGNOSIS — O09523 Supervision of elderly multigravida, third trimester: Secondary | ICD-10-CM

## 2015-04-11 DIAGNOSIS — O3663X1 Maternal care for excessive fetal growth, third trimester, fetus 1: Secondary | ICD-10-CM

## 2015-04-11 DIAGNOSIS — R110 Nausea: Secondary | ICD-10-CM

## 2015-04-11 DIAGNOSIS — O209 Hemorrhage in early pregnancy, unspecified: Secondary | ICD-10-CM

## 2015-04-11 LAB — POCT URINALYSIS DIPSTICK
Bilirubin, UA: NEGATIVE
Glucose, UA: NEGATIVE
Ketones, UA: NEGATIVE
LEUKOCYTES UA: NEGATIVE
NITRITE UA: NEGATIVE
PH UA: 6
Protein, UA: NEGATIVE
RBC UA: NEGATIVE
Spec Grav, UA: 1.01
UROBILINOGEN UA: NEGATIVE

## 2015-04-11 MED ORDER — PROMETHAZINE HCL 25 MG PO TABS: 25.0000 mg | ORAL_TABLET | Freq: Four times a day (QID) | ORAL | Status: DC | PRN

## 2015-04-11 NOTE — Progress Notes (Signed)
Subjective:    Megan Edwards is a 40 y.o. female being seen today for her obstetrical visit. She is at [redacted]w[redacted]d gestation. Patient reports nausea. Fetal movement: normal.  Problem List Items Addressed This Visit    None    Visit Diagnoses    LGA (large for gestational age) fetus affecting management of mother, third trimester, fetus 1    -  Primary    Relevant Orders    [place] MFM OB COMP + 14 WK    [place] MFM OB FOLLOW UP    POCT urinalysis dipstick (Completed)    AMA (advanced maternal age) multigravida 35+, third trimester        Relevant Orders    [place] MFM OB COMP + 14 WK    [place] MFM OB FOLLOW UP    POCT urinalysis dipstick (Completed)    HTN complicating peripregnancy, antepartum, third trimester        Relevant Orders    [place] MFM OB COMP + 14 WK    [place] MFM OB FOLLOW UP    POCT urinalysis dipstick (Completed)    Vaginal bleeding in pregnancy, first trimester        Relevant Medications    promethazine (PHENERGAN) 25 MG tablet    Other Relevant Orders    POCT urinalysis dipstick (Completed)    Nausea and vomiting during pregnancy prior to [redacted] weeks gestation        Relevant Medications    promethazine (PHENERGAN) 25 MG tablet    Other Relevant Orders    POCT urinalysis dipstick (Completed)      Patient Active Problem List   Diagnosis Date Noted  . Normal delivery 11/28/2012  . Immediate postpartum hemorrhage, with delivery 11/28/2012  . Maternal anemia complicating pregnancy, childbirth, or the puerperium 11/28/2012  . Pregnancy induced hypertension 11/25/2012  . Elderly primigravida, antepartum 06/26/2012  . Methadone dependence (HCC) 08/14/2011  . Polysubstance (excluding opioids) dependence (HCC) 08/11/2011   Objective:    BP 146/80 mmHg  Pulse 93  Temp(Src) 97.8 F (36.6 C)  Wt 212 lb (96.163 kg)  LMP 09/23/2014 FHT:  150 BPM  Uterine Size: size greater than dates  Presentation: unsure     Assessment:    Pregnancy @ [redacted]w[redacted]d weeks   Plan:     labs reviewed,  problem list updated Consent signed. GBS sent TDAP offered  Rhogam given for RH negative Pediatrician: discussed. Infant feeding: plans to bottle feed. Maternity leave: discussed. Cigarette smoking: never smoked. Orders Placed This Encounter  Procedures  . [place] MFM OB COMP + 14 WK    Standing Status: Future     Number of Occurrences:      Standing Expiration Date: 06/08/2016    Order Specific Question:  Reason for Exam (SYMPTOM  OR DIAGNOSIS REQUIRED)    Answer:  LGA.  AMA.  Chronic Hypertension.    Order Specific Question:  Preferred imaging location?    Answer:  [facility]  . [place] MFM OB FOLLOW UP    Standing Status: Future     Number of Occurrences:      Standing Expiration Date: 06/08/2016    Order Specific Question:  Reason for Exam (SYMPTOM  OR DIAGNOSIS REQUIRED)    Answer:  LGA.  AMA.  Chronic Hypertension.    Order Specific Question:  Preferred imaging location?    Answer:  [facility]  . POCT urinalysis dipstick   Meds ordered this encounter  Medications  . promethazine (PHENERGAN) 25 MG tablet    Sig:  Take 1 tablet (25 mg total) by mouth every 6 (six) hours as needed for nausea.    Dispense:  30 tablet    Refill:  2   Follow up in 2 Weeks.

## 2015-04-12 ENCOUNTER — Other Ambulatory Visit: Payer: Self-pay | Admitting: Obstetrics

## 2015-04-12 ENCOUNTER — Ambulatory Visit (HOSPITAL_COMMUNITY)
Admission: RE | Admit: 2015-04-12 | Discharge: 2015-04-12 | Disposition: A | Discharge status: Home / Self Care | Payer: Medicaid Other | Source: Ambulatory Visit | Attending: Obstetrics | Admitting: Obstetrics

## 2015-04-12 ENCOUNTER — Encounter (HOSPITAL_COMMUNITY): Payer: Self-pay

## 2015-04-12 DIAGNOSIS — O99323 Drug use complicating pregnancy, third trimester: Secondary | ICD-10-CM | POA: Diagnosis present

## 2015-04-12 DIAGNOSIS — Z3A30 30 weeks gestation of pregnancy: Secondary | ICD-10-CM | POA: Diagnosis not present

## 2015-04-12 DIAGNOSIS — O26843 Uterine size-date discrepancy, third trimester: Secondary | ICD-10-CM | POA: Insufficient documentation

## 2015-04-12 DIAGNOSIS — O98513 Other viral diseases complicating pregnancy, third trimester: Secondary | ICD-10-CM | POA: Diagnosis not present

## 2015-04-12 DIAGNOSIS — O09523 Supervision of elderly multigravida, third trimester: Secondary | ICD-10-CM

## 2015-04-12 DIAGNOSIS — O3663X1 Maternal care for excessive fetal growth, third trimester, fetus 1: Secondary | ICD-10-CM

## 2015-04-12 DIAGNOSIS — O10013 Pre-existing essential hypertension complicating pregnancy, third trimester: Secondary | ICD-10-CM | POA: Insufficient documentation

## 2015-04-12 DIAGNOSIS — O09893 Supervision of other high risk pregnancies, third trimester: Secondary | ICD-10-CM | POA: Diagnosis not present

## 2015-04-12 DIAGNOSIS — O163 Unspecified maternal hypertension, third trimester: Secondary | ICD-10-CM

## 2015-04-12 DIAGNOSIS — F1990 Other psychoactive substance use, unspecified, uncomplicated: Secondary | ICD-10-CM | POA: Insufficient documentation

## 2015-04-19 ENCOUNTER — Other Ambulatory Visit: Payer: Self-pay | Admitting: Certified Nurse Midwife

## 2015-04-25 ENCOUNTER — Encounter: Payer: Medicaid Other | Admitting: Obstetrics

## 2015-04-26 ENCOUNTER — Ambulatory Visit (INDEPENDENT_AMBULATORY_CARE_PROVIDER_SITE_OTHER): Payer: Medicaid Other | Admitting: Obstetrics

## 2015-04-26 ENCOUNTER — Encounter: Payer: Self-pay | Admitting: Obstetrics

## 2015-04-26 VITALS — BP 126/83 | HR 105 | Wt 212.0 lb

## 2015-04-26 DIAGNOSIS — J069 Acute upper respiratory infection, unspecified: Secondary | ICD-10-CM

## 2015-04-26 DIAGNOSIS — Z3483 Encounter for supervision of other normal pregnancy, third trimester: Secondary | ICD-10-CM

## 2015-04-26 LAB — POCT URINALYSIS DIPSTICK
BILIRUBIN UA: NEGATIVE
Blood, UA: NEGATIVE
Glucose, UA: NEGATIVE
KETONES UA: NEGATIVE
LEUKOCYTES UA: NEGATIVE
Nitrite, UA: NEGATIVE
PH UA: 5
Protein, UA: NEGATIVE
SPEC GRAV UA: 1.015
Urobilinogen, UA: NEGATIVE

## 2015-04-26 MED ORDER — AZITHROMYCIN 250 MG PO TABS: ORAL_TABLET | ORAL | Status: DC

## 2015-04-26 NOTE — Progress Notes (Signed)
Subjective:    Megan Edwards is a 40 y.o. female being seen today for her obstetrical visit. She is at [redacted]w[redacted]d gestation. Patient reports cough and congestion for ~ 2 weeks. Fetal movement: normal.  Problem List Items Addressed This Visit    None    Visit Diagnoses    Encounter for supervision of other normal pregnancy in third trimester    -  Primary    Relevant Orders    POCT urinalysis dipstick (Completed)    Acute URI        Relevant Medications    azithromycin (ZITHROMAX Z-PAK) 250 MG tablet      Patient Active Problem List   Diagnosis Date Noted  . Normal delivery 11/28/2012  . Immediate postpartum hemorrhage, with delivery 11/28/2012  . Maternal anemia complicating pregnancy, childbirth, or the puerperium 11/28/2012  . Pregnancy induced hypertension 11/25/2012  . Elderly primigravida, antepartum 06/26/2012  . Methadone dependence (HCC) 08/14/2011  . Polysubstance (excluding opioids) dependence (HCC) 08/11/2011   Objective:    BP 126/83 mmHg  Pulse 105  Wt 212 lb (96.163 kg)  LMP 09/23/2014 FHT:  150 BPM  Uterine Size: size greater than dates  Presentation: unsure     Assessment:    Pregnancy @ [redacted]w[redacted]d weeks    URI  Plan:    Z- Pak Rx    labs reviewed, problem list updated Consent signed. GBS sent TDAP offered  Rhogam given for RH negative Pediatrician: discussed. Infant feeding: plans to breastfeed. Maternity leave: discussed. Cigarette smoking: never smoked. Orders Placed This Encounter  Procedures  . POCT urinalysis dipstick   Meds ordered this encounter  Medications  . azithromycin (ZITHROMAX Z-PAK) 250 MG tablet    Sig: Take as directed.    Dispense:  6 tablet    Refill:  2   Follow up in 2 Weeks.

## 2015-04-26 NOTE — Progress Notes (Signed)
Pt states that she has chest/head cold for past 2 weeks. Pt states that she has had some ctx this week.

## 2015-05-10 ENCOUNTER — Ambulatory Visit (HOSPITAL_COMMUNITY)
Admission: RE | Admit: 2015-05-10 | Discharge: 2015-05-10 | Disposition: A | Discharge status: Home / Self Care | Payer: Medicaid Other | Source: Ambulatory Visit | Attending: Obstetrics | Admitting: Obstetrics

## 2015-05-10 ENCOUNTER — Other Ambulatory Visit (HOSPITAL_COMMUNITY): Payer: Self-pay | Admitting: Maternal and Fetal Medicine

## 2015-05-10 DIAGNOSIS — O26843 Uterine size-date discrepancy, third trimester: Secondary | ICD-10-CM

## 2015-05-10 DIAGNOSIS — B009 Herpesviral infection, unspecified: Secondary | ICD-10-CM

## 2015-05-10 DIAGNOSIS — O09523 Supervision of elderly multigravida, third trimester: Secondary | ICD-10-CM

## 2015-05-10 DIAGNOSIS — O98513 Other viral diseases complicating pregnancy, third trimester: Secondary | ICD-10-CM

## 2015-05-10 DIAGNOSIS — O163 Unspecified maternal hypertension, third trimester: Secondary | ICD-10-CM

## 2015-05-10 DIAGNOSIS — O99323 Drug use complicating pregnancy, third trimester: Secondary | ICD-10-CM

## 2015-05-10 DIAGNOSIS — O10013 Pre-existing essential hypertension complicating pregnancy, third trimester: Secondary | ICD-10-CM | POA: Insufficient documentation

## 2015-05-10 DIAGNOSIS — F1990 Other psychoactive substance use, unspecified, uncomplicated: Secondary | ICD-10-CM | POA: Diagnosis not present

## 2015-05-10 DIAGNOSIS — Z3A34 34 weeks gestation of pregnancy: Secondary | ICD-10-CM

## 2015-05-10 DIAGNOSIS — F1920 Other psychoactive substance dependence, uncomplicated: Secondary | ICD-10-CM

## 2015-05-11 ENCOUNTER — Ambulatory Visit (INDEPENDENT_AMBULATORY_CARE_PROVIDER_SITE_OTHER): Payer: Medicaid Other | Admitting: Obstetrics

## 2015-05-11 ENCOUNTER — Encounter (HOSPITAL_COMMUNITY): Payer: Self-pay | Admitting: *Deleted

## 2015-05-11 ENCOUNTER — Inpatient Hospital Stay (HOSPITAL_COMMUNITY)
Admission: AD | Admit: 2015-05-11 | Discharge: 2015-05-11 | Disposition: A | Discharge status: Home / Self Care | Payer: Medicaid Other | Source: Ambulatory Visit | Attending: Obstetrics | Admitting: Obstetrics

## 2015-05-11 ENCOUNTER — Encounter: Payer: Self-pay | Admitting: Obstetrics

## 2015-05-11 VITALS — BP 159/99 | HR 109 | Temp 98.1°F | Wt 218.0 lb

## 2015-05-11 DIAGNOSIS — O99613 Diseases of the digestive system complicating pregnancy, third trimester: Secondary | ICD-10-CM

## 2015-05-11 DIAGNOSIS — Z3A34 34 weeks gestation of pregnancy: Secondary | ICD-10-CM | POA: Diagnosis not present

## 2015-05-11 DIAGNOSIS — O0993 Supervision of high risk pregnancy, unspecified, third trimester: Secondary | ICD-10-CM

## 2015-05-11 DIAGNOSIS — K5900 Constipation, unspecified: Secondary | ICD-10-CM | POA: Diagnosis not present

## 2015-05-11 DIAGNOSIS — R030 Elevated blood-pressure reading, without diagnosis of hypertension: Secondary | ICD-10-CM | POA: Insufficient documentation

## 2015-05-11 DIAGNOSIS — R51 Headache: Secondary | ICD-10-CM | POA: Diagnosis present

## 2015-05-11 DIAGNOSIS — O99343 Other mental disorders complicating pregnancy, third trimester: Secondary | ICD-10-CM | POA: Insufficient documentation

## 2015-05-11 DIAGNOSIS — O163 Unspecified maternal hypertension, third trimester: Secondary | ICD-10-CM

## 2015-05-11 DIAGNOSIS — F329 Major depressive disorder, single episode, unspecified: Secondary | ICD-10-CM | POA: Diagnosis not present

## 2015-05-11 DIAGNOSIS — O26893 Other specified pregnancy related conditions, third trimester: Secondary | ICD-10-CM | POA: Insufficient documentation

## 2015-05-11 LAB — URINALYSIS, ROUTINE W REFLEX MICROSCOPIC
BILIRUBIN URINE: NEGATIVE
Glucose, UA: NEGATIVE mg/dL
HGB URINE DIPSTICK: NEGATIVE
KETONES UR: NEGATIVE mg/dL
Leukocytes, UA: NEGATIVE
NITRITE: NEGATIVE
Protein, ur: NEGATIVE mg/dL
Specific Gravity, Urine: <1.005 — ABNORMAL LOW (ref 1.005–1.030)
pH: 6 (ref 5.0–8.0)

## 2015-05-11 LAB — URIC ACID: URIC ACID, SERUM: 3.4 mg/dL (ref 2.3–6.6)

## 2015-05-11 LAB — COMPREHENSIVE METABOLIC PANEL
ALT: 15 U/L (ref 14–54)
AST: 19 U/L (ref 15–41)
Albumin: 3.1 g/dL — ABNORMAL LOW (ref 3.5–5.0)
Alkaline Phosphatase: 90 U/L (ref 38–126)
Anion gap: 7 (ref 5–15)
BILIRUBIN TOTAL: 0.2 mg/dL — AB (ref 0.3–1.2)
BUN: 10 mg/dL (ref 6–20)
CALCIUM: 8.6 mg/dL — AB (ref 8.9–10.3)
CHLORIDE: 105 mmol/L (ref 101–111)
CO2: 23 mmol/L (ref 22–32)
CREATININE: 0.7 mg/dL (ref 0.44–1.00)
Glucose, Bld: 95 mg/dL (ref 65–99)
Potassium: 3.8 mmol/L (ref 3.5–5.1)
Sodium: 135 mmol/L (ref 135–145)
TOTAL PROTEIN: 6.8 g/dL (ref 6.5–8.1)

## 2015-05-11 LAB — CBC
HEMATOCRIT: 32.6 % — AB (ref 36.0–46.0)
Hemoglobin: 11.2 g/dL — ABNORMAL LOW (ref 12.0–15.0)
MCH: 27.7 pg (ref 26.0–34.0)
MCHC: 34.4 g/dL (ref 30.0–36.0)
MCV: 80.5 fL (ref 78.0–100.0)
PLATELETS: 237 10*3/uL (ref 150–400)
RBC: 4.05 MIL/uL (ref 3.87–5.11)
RDW: 13.5 % (ref 11.5–15.5)
WBC: 12.9 10*3/uL — AB (ref 4.0–10.5)

## 2015-05-11 LAB — POCT URINALYSIS DIPSTICK
BILIRUBIN UA: NEGATIVE
GLUCOSE UA: NEGATIVE
Ketones, UA: NEGATIVE
Leukocytes, UA: NEGATIVE
NITRITE UA: NEGATIVE
RBC UA: NEGATIVE
SPEC GRAV UA: 1.015
Urobilinogen, UA: NEGATIVE
pH, UA: 5

## 2015-05-11 LAB — LACTATE DEHYDROGENASE: LDH: 134 U/L (ref 98–192)

## 2015-05-11 LAB — PROTEIN / CREATININE RATIO, URINE: CREATININE, URINE: 29 mg/dL

## 2015-05-11 MED ORDER — MAGNESIUM HYDROXIDE 400 MG/5ML PO SUSP: 30.0000 mL | Freq: Every day | ORAL | Status: DC | PRN

## 2015-05-11 MED ORDER — POLYETHYLENE GLYCOL 3350 17 GM/SCOOP PO POWD: 17.0000 g | Freq: Every day | ORAL | Status: DC

## 2015-05-11 MED ORDER — DOCUSATE SODIUM 100 MG PO CAPS: 100.0000 mg | ORAL_CAPSULE | Freq: Two times a day (BID) | ORAL | Status: DC | PRN

## 2015-05-11 MED ORDER — FLEET ENEMA 7-19 GM/118ML RE ENEM: 1.0000 | ENEMA | RECTAL | Status: DC | PRN

## 2015-05-11 NOTE — Discharge Instructions (Signed)
Hypertension During Pregnancy  Hypertension, or high blood pressure, is when there is extra pressure inside your blood vessels that carry blood from the heart to the rest of your body (arteries). It can happen at any time in life, including pregnancy. Hypertension during pregnancy can cause problems for you and your baby. Your baby might not weigh as much as he or she should at birth or might be born early (premature). Very bad cases of hypertension during pregnancy can be life-threatening.   Different types of hypertension can occur during pregnancy. These include:  · Chronic hypertension. This happens when a woman has hypertension before pregnancy and it continues during pregnancy.  · Gestational hypertension. This is when hypertension develops during pregnancy.  · Preeclampsia or toxemia of pregnancy. This is a very serious type of hypertension that develops only during pregnancy. It affects the whole body and can be very dangerous for both mother and baby.    Gestational hypertension and preeclampsia usually go away after your baby is born. Your blood pressure will likely stabilize within 6 weeks. Women who have hypertension during pregnancy have a greater chance of developing hypertension later in life or with future pregnancies.  RISK FACTORS  There are certain factors that make it more likely for you to develop hypertension during pregnancy. These include:  · Having hypertension before pregnancy.  · Having hypertension during a previous pregnancy.  · Being overweight.  · Being older than 40 years.  · Being pregnant with more than one baby.  · Having diabetes or kidney problems.  SIGNS AND SYMPTOMS  Chronic and gestational hypertension rarely cause symptoms. Preeclampsia has symptoms, which may include:  · Increased protein in your urine. Your health care provider will check for this at every prenatal visit.  · Swelling of your hands and face.  · Rapid weight gain.  · Headaches.  · Visual changes.  · Being  bothered by light.  · Abdominal pain, especially in the upper right area.  · Chest pain.  · Shortness of breath.  · Increased reflexes.  · Seizures. These occur with a more severe form of preeclampsia, called eclampsia.  DIAGNOSIS   You may be diagnosed with hypertension during a regular prenatal exam. At each prenatal visit, you may have:  · Your blood pressure checked.  · A urine test to check for protein in your urine.  The type of hypertension you are diagnosed with depends on when you developed it. It also depends on your specific blood pressure reading.  · Developing hypertension before 20 weeks of pregnancy is consistent with chronic hypertension.  · Developing hypertension after 20 weeks of pregnancy is consistent with gestational hypertension.  · Hypertension with increased urinary protein is diagnosed as preeclampsia.  · Blood pressure measurements that stay above 160 systolic or 110 diastolic are a sign of severe preeclampsia.  TREATMENT  Treatment for hypertension during pregnancy varies. Treatment depends on the type of hypertension and how serious it is.  · If you take medicine for chronic hypertension, you may need to switch medicines.    Medicines called ACE inhibitors should not be taken during pregnancy.    Low-dose aspirin may be suggested for women who have risk factors for preeclampsia.  · If you have gestational hypertension, you may need to take a blood pressure medicine that is safe during pregnancy. Your health care provider will recommend the correct medicine.  · If you have severe preeclampsia, you may need to be in the hospital. Health care   done to protect you and your baby. The only cure for preeclampsia is delivery.  Your health  care provider may recommend that you take one low-dose aspirin (81 mg) each day to help prevent high blood pressure during your pregnancy if you are at risk for preeclampsia. You may be at risk for preeclampsia if:  You had preeclampsia or eclampsia during a previous pregnancy.  Your baby did not grow as expected during a previous pregnancy.  You experienced preterm birth with a previous pregnancy.  You experienced a separation of the placenta from the uterus (placental abruption) during a previous pregnancy.  You experienced the loss of your baby during a previous pregnancy.  You are pregnant with more than one baby.  You have other medical conditions, such as diabetes or an autoimmune disease. HOME CARE INSTRUCTIONS  Schedule and keep all of your regular prenatal care appointments. This is important.  Take medicines only as directed by your health care provider. Tell your health care provider about all medicines you take.  Eat as little salt as possible.  Get regular exercise.  Do not drink alcohol.  Do not use tobacco products.  Do not drink products with caffeine.  Lie on your left side when resting. SEEK IMMEDIATE MEDICAL CARE IF:  You have severe abdominal pain.  You have sudden swelling in your hands, ankles, or face.  You gain 4 pounds (1.8 kg) or more in 1 week.  You vomit repeatedly.  You have vaginal bleeding.  You do not feel your baby moving as much.  You have a headache.  You have blurred or double vision.  You have muscle twitching or spasms.  You have shortness of breath.  You have blue fingernails or lips.  You have blood in your urine. MAKE SURE YOU:  Understand these instructions.  Will watch your condition.  Will get help right away if you are not doing well or get worse.   This information is not intended to replace advice given to you by your health care provider. Make sure you discuss any questions you have with your health care  provider.   Document Released: 11/27/2010 Document Revised: 04/01/2014 Document Reviewed: 10/08/2012 Elsevier Interactive Patient Education 2016 [employer].  Constipation, Adult Constipation is when a person has fewer than three bowel movements a week, has difficulty having a bowel movement, or has stools that are dry, hard, or larger than normal. As people grow older, constipation is more common. A low-fiber diet, not taking in enough fluids, and taking certain medicines may make constipation worse.  CAUSES   Certain medicines, such as antidepressants, pain medicine, iron supplements, antacids, and water pills.   Certain diseases, such as diabetes, irritable bowel syndrome (IBS), thyroid disease, or depression.   Not drinking enough water.   Not eating enough fiber-rich foods.   Stress or travel.   Lack of physical activity or exercise.   Ignoring the urge to have a bowel movement.   Using laxatives too much.  SIGNS AND SYMPTOMS   Having fewer than three bowel movements a week.   Straining to have a bowel movement.   Having stools that are hard, dry, or larger than normal.   Feeling full or bloated.   Pain in the lower abdomen.   Not feeling relief after having a bowel movement.  DIAGNOSIS  Your health care provider will take a medical history and perform a physical exam. Further testing may be done for severe constipation. Some tests may include:  A barium enema X-ray to examine your rectum, colon, and, sometimes, your small intestine.   A sigmoidoscopy to examine your lower colon.   A colonoscopy to examine your entire colon. TREATMENT  Treatment will depend on the severity of your constipation and what is causing it. Some dietary treatments include drinking more fluids and eating more fiber-rich foods. Lifestyle treatments may include regular exercise. If these diet and lifestyle recommendations do not help, your health care provider may recommend  taking over-the-counter laxative medicines to help you have bowel movements. Prescription medicines may be prescribed if over-the-counter medicines do not work.  HOME CARE INSTRUCTIONS   Eat foods that have a lot of fiber, such as fruits, vegetables, whole grains, and beans.  Limit foods high in fat and processed sugars, such as french fries, hamburgers, cookies, candies, and soda.   A fiber supplement may be added to your diet if you cannot get enough fiber from foods.   Drink enough fluids to keep your urine clear or pale yellow.   Exercise regularly or as directed by your health care provider.   Go to the restroom when you have the urge to go. Do not hold it.   Only take over-the-counter or prescription medicines as directed by your health care provider. Do not take other medicines for constipation without talking to your health care provider first.  SEEK IMMEDIATE MEDICAL CARE IF:   You have bright red blood in your stool.   Your constipation lasts for more than 4 days or gets worse.   You have abdominal or rectal pain.   You have thin, pencil-like stools.   You have unexplained weight loss. MAKE SURE YOU:   Understand these instructions.  Will watch your condition.  Will get help right away if you are not doing well or get worse.   This information is not intended to replace advice given to you by your health care provider. Make sure you discuss any questions you have with your health care provider.   Document Released: 12/08/2003 Document Revised: 04/01/2014 Document Reviewed: 12/21/2012 Elsevier Interactive Patient Education 2016 [employer] Inc[employer]  High-Fiber Diet Fiber, also called dietary fiber, is a type of carbohydrate found in fruits, vegetables, whole grains, and beans. A high-fiber diet can have many health benefits. Your health care provider may recommend a high-fiber diet to help:  Prevent constipation. Fiber can make your bowel movements more  regular.  Lower your cholesterol.  Relieve hemorrhoids, uncomplicated diverticulosis, or irritable bowel syndrome.  Prevent overeating as part of a weight-loss plan.  Prevent heart disease, type 2 diabetes, and certain cancers. WHAT IS MY PLAN? The recommended daily intake of fiber includes:  38 grams for men under age 59.  30 grams for men over age 38.  25 grams for women under age 33.  21 grams for women over age 19. You can get the recommended daily intake of dietary fiber by eating a variety of fruits, vegetables, grains, and beans. Your health care provider may also recommend a fiber supplement if it is not possible to get enough fiber through your diet. WHAT DO I NEED TO KNOW ABOUT A HIGH-FIBER DIET?  Fiber supplements have not been widely studied for their effectiveness, so it is better to get fiber through food sources.  Always check the fiber content on thenutrition facts label of any prepackaged food. Look for foods that contain at least 5 grams of fiber per serving.  Ask your dietitian if you have questions about specific foods  that are related to your condition, especially if those foods are not listed in the following section.  Increase your daily fiber consumption gradually. Increasing your intake of dietary fiber too quickly may cause bloating, cramping, or gas.  Drink plenty of water. Water helps you to digest fiber. WHAT FOODS CAN I EAT? Grains Whole-grain breads. Multigrain cereal. Oats and oatmeal. Brown rice. Barley. Bulgur wheat. Millet. Bran muffins. Popcorn. Rye wafer crackers. Vegetables Sweet potatoes. Spinach. Kale. Artichokes. Cabbage. Broccoli. Green peas. Carrots. Squash. Fruits Berries. Pears. Apples. Oranges. Avocados. Prunes and raisins. Dried figs. Meats and Other Protein Sources Navy, kidney, pinto, and soy beans. Split peas. Lentils. Nuts and seeds. Dairy Fiber-fortified yogurt. Beverages Fiber-fortified soy milk. Fiber-fortified orange  juice. Other Fiber bars. The items listed above may not be a complete list of recommended foods or beverages. Contact your dietitian for more options. WHAT FOODS ARE NOT RECOMMENDED? Grains White bread. Pasta made with refined flour. White rice. Vegetables Fried potatoes. Canned vegetables. Well-cooked vegetables.  Fruits Fruit juice. Cooked, strained fruit. Meats and Other Protein Sources Fatty cuts of meat. Fried Environmental education officer or fried fish. Dairy Milk. Yogurt. Cream cheese. Sour cream. Beverages Soft drinks. Other Cakes and pastries. Butter and oils. The items listed above may not be a complete list of foods and beverages to avoid. Contact your dietitian for more information. WHAT ARE SOME TIPS FOR INCLUDING HIGH-FIBER FOODS IN MY DIET?  Eat a wide variety of high-fiber foods.  Make sure that half of all grains consumed each day are whole grains.  Replace breads and cereals made from refined flour or white flour with whole-grain breads and cereals.  Replace white rice with brown rice, bulgur wheat, or millet.  Start the day with a breakfast that is high in fiber, such as a cereal that contains at least 5 grams of fiber per serving.  Use beans in place of meat in soups, salads, or pasta.  Eat high-fiber snacks, such as berries, raw vegetables, nuts, or popcorn.   This information is not intended to replace advice given to you by your health care provider. Make sure you discuss any questions you have with your health care provider.   Document Released: 03/11/2005 Document Revised: 04/01/2014 Document Reviewed: 08/24/2013 Elsevier Interactive Patient Education [employer]

## 2015-05-11 NOTE — MAU Provider Note (Deleted)
  History     CSN: [id number]  Arrival date and time: 04/06/13 0847   First Provider Initiated Contact with Patient 04/06/13 1017      Chief Complaint  Patient presents with  . Abdominal Pain   HPI:  Megan Edwards is a [id number] at [redacted]w[redacted]d who presents with 2 day hx of headaches. HA was constant, on right side of head, no change in vision. HA was worse with light and sound. Patient states that for the last 2 days she has also had constant abdominal pain, nausea, non-bloody non-bilious vomiting, and constipation.   Headache and nausea resolved while in MAU but constipation persists. Constipation has been persistent throughout entire pregnancy. Pt has had to resort to manual extraction. Admits to hematochezia. Abdominal pain is worsened with standing and alleviated with laying on her side.   Patient has been taking good PO.  Denies vaginal discharge, gushes of fluid, or swelling in legs.    Past Medical History  Diagnosis Date  . Fibroid     Past Surgical History  Procedure Laterality Date  . Breast surgery      left side had cyst, non cancerous  . Oophorectomy      unsure of which side, had cyst and needed ovary removed    Family History  Problem Relation Age of Onset  . Cancer Sister 92    breast  . Cancer Paternal Grandmother     breast    History  Substance Use Topics  . Smoking status: Current Every Day Smoker -- 0.50 packs/day for 25 years    Types: Cigarettes  . Smokeless tobacco: Never Used  . Alcohol Use: No    Allergies: No Known Allergies  No prescriptions prior to admission    ROS Physical Exam   Blood pressure 150/67, pulse 81, temperature 98.5 F (36.9 C), temperature source Oral, resp. rate 18, height  (1.626 m), weight 79.198 kg (174 lb 9.6 oz), last menstrual period 12/11/2012.  Physical Exam  MAU Course  Procedures  MDM   Assessment and Plan    @

## 2015-05-11 NOTE — MAU Provider Note (Signed)
Chief Complaint:  Hypertension   None     HPI: Megan Edwards is a 40 y.o. G5P1031 at [redacted]w[redacted]d who was sent from the office with elevated BP and 2 day hx of headaches. HA was constant, on right side of head, no change in vision. HA was worse with light and sound. Patient states that for the last 2 days she has also had constant abdominal pain, nausea, non-bloody non-bilious vomiting, and constipation.   Headache and nausea resolved while in MAU but constipation persists. Constipation has been persistent throughout entire pregnancy. Pt has had to resort to manual extraction. Admits to hematochezia. Abdominal pain is worsened with standing and alleviated with laying on her side.   Patient has been taking good PO.   She reports good fetal movement, denies regular contractions, LOF, vaginal bleeding, vaginal itching/burning, urinary symptoms, dizziness, or fever/chills.    Of note, she is on subutex for opiate addiction and has chronic constipation. She recently stopped taking her stool softener at home.  HPI  Past Medical History: Past Medical History  Diagnosis Date  . Depression   . Genital herpes 2000  . Infection     UTI  . Ovarian cyst   . Abnormal Pap smear   . SVT (supraventricular tachycardia) (HCC)   . Hypertension     Past obstetric history: OB History  Gravida Para Term Preterm AB SAB TAB Ectopic Multiple Living  0 0 0 1    # Outcome Date GA Lbr Len/2nd Weight Sex Delivery Anes PTL Lv  5 Current           4 Term 11/26/12 [redacted]w[redacted]d 05:29 / 01:08 8 lb 15.9 oz (4.08 kg) M Vag-Spont Spinal  Y     Comments: Facial bruising noted  3 SAB           2 TAB           1 TAB               Past Surgical History: Past Surgical History  Procedure Laterality Date  . Knee surgery    . Breast enhancement surgery    . Tonsillectomy    . Addenoidectomy    . Wisdom tooth extraction    . Breast surgery    . Knee surgery Left     Family History: Family History   Problem Relation Age of Onset  . Alcoholism    . Leukemia    . Cervical cancer    . Bone cancer    . Cirrhosis    . Diabetes Mellitus II    . Depression    . Hypertension    . Thyroid cancer      Social History: Social History  Substance Use Topics  . Smoking status: Never Smoker   . Smokeless tobacco: Never Used  . Alcohol Use: No     Comment: 4 40's beer daily- not currently    Allergies:  Allergies  Allergen Reactions  . Penicillins Rash    Has patient had a PCN reaction causing immediate rash, facial/tongue/throat swelling, SOB or lightheadedness with hypotension: Yes Has patient had a PCN reaction causing severe rash involving mucus membranes or skin necrosis: No Has patient had a PCN reaction that required hospitalization No Has patient had a PCN reaction occurring within the last 10 years: No If all of the above answers are "NO", then may proceed with Cephalosporin use.    Meds:  No prescriptions prior to  admission    ROS:  Review of Systems  Constitutional: Negative for fever, chills and fatigue.  Eyes: Negative for visual disturbance.  Respiratory: Negative for shortness of breath.   Cardiovascular: Negative for chest pain.  Gastrointestinal: Positive for abdominal pain. Negative for nausea and vomiting.  Genitourinary: Negative for dysuria, flank pain, vaginal bleeding, vaginal discharge, difficulty urinating, vaginal pain and pelvic pain.  Neurological: Positive for headaches. Negative for dizziness.  Psychiatric/Behavioral: Negative.      I have reviewed patient's Past Medical Hx, Surgical Hx, Family Hx, Social Hx, medications and allergies.   Physical Exam  Patient Vitals for the past 24 hrs:  BP Temp Temp src Pulse Resp Height Weight  05/11/15 1201 146/77 mmHg - - 94 - - -  05/11/15 1146 133/71 mmHg - - 106 - - -  05/11/15 1133 144/96 mmHg - - (!) 126 - - -  05/11/15 1117 (!) 172/107 mmHg 98.2 F (36.8 C) Oral 104 18 5\' 7"  (1.702 m) 217 lb 6.4  oz (98.612 kg)   Constitutional: Well-developed, well-nourished female in no acute distress.  Cardiovascular: normal rate Respiratory: normal effort GI: Abd soft, non-tender, gravid appropriate for gestational age.  MS: Extremities nontender, no edema, normal ROM Neurologic: Alert and oriented x 4.  GU: Neg CVAT.  PELVIC EXAM: Cervix pink, visually closed, without lesion, scant white creamy discharge, vaginal walls and external genitalia normal Bimanual exam: Cervix 0/long/high, firm, anterior, neg CMT, uterus nontender, nonenlarged, adnexa without tenderness, enlargement, or mass     FHT:  Baseline 135 , moderate variability, accelerations present, no decelerations Contractions: None on toco or to palpation   Labs: Results for orders placed or performed during the hospital encounter of 05/11/15 (from the past 24 hour(s))  Urinalysis, Routine w reflex microscopic (not at [facility])     Status: Abnormal   Collection Time: 05/11/15 11:00 AM  Result Value Ref Range   Color, Urine YELLOW YELLOW   APPearance CLEAR CLEAR   Specific Gravity, Urine <1.005 (L) 1.005 - 1.030   pH 6.0 5.0 - 8.0   Glucose, UA NEGATIVE NEGATIVE mg/dL   Hgb urine dipstick NEGATIVE NEGATIVE   Bilirubin Urine NEGATIVE NEGATIVE   Ketones, ur NEGATIVE NEGATIVE mg/dL   Protein, ur NEGATIVE NEGATIVE mg/dL   Nitrite NEGATIVE NEGATIVE   Leukocytes, UA NEGATIVE NEGATIVE  Protein / creatinine ratio, urine     Status: None   Collection Time: 05/11/15 11:00 AM  Result Value Ref Range   Creatinine, Urine 29.00 mg/dL   Total Protein, Urine <6 mg/dL   Protein Creatinine Ratio        0.00 - 0.15 mg/mg[Cre]  CBC     Status: Abnormal   Collection Time: 05/11/15 11:45 AM  Result Value Ref Range   WBC 12.9 (H) 4.0 - 10.5 K/uL   RBC 4.05 3.87 - 5.11 MIL/uL   Hemoglobin 11.2 (L) 12.0 - 15.0 g/dL   HCT 96.0 (L) 45.4 - 09.8 %   MCV 80.5 78.0 - 100.0 fL   MCH 27.7 26.0 - 34.0 pg   MCHC 34.4 30.0 - 36.0 g/dL   RDW 11.9 14.7 -  82.9 %   Platelets 237 150 - 400 K/uL  Comprehensive metabolic panel     Status: Abnormal   Collection Time: 05/11/15 11:45 AM  Result Value Ref Range   Sodium 135 135 - 145 mmol/L   Potassium 3.8 3.5 - 5.1 mmol/L   Chloride 105 101 - 111 mmol/L   CO2 23 22 -  32 mmol/L   Glucose, Bld 95 65 - 99 mg/dL   BUN 10 6 - 20 mg/dL   Creatinine, Ser 1.61 0.44 - 1.00 mg/dL   Calcium 8.6 (L) 8.9 - 10.3 mg/dL   Total Protein 6.8 6.5 - 8.1 g/dL   Albumin 3.1 (L) 3.5 - 5.0 g/dL   AST 19 15 - 41 U/L   ALT 15 14 - 54 U/L   Alkaline Phosphatase 90 38 - 126 U/L   Total Bilirubin 0.2 (L) 0.3 - 1.2 mg/dL   GFR calc non Af Amer >60 >60 mL/min   GFR calc Af Amer >60 >60 mL/min   Anion gap 7 5 - 15  Uric acid     Status: None   Collection Time: 05/11/15 11:45 AM  Result Value Ref Range   Uric Acid, Serum 3.4 2.3 - 6.6 mg/dL  Lactate dehydrogenase     Status: None   Collection Time: 05/11/15 11:45 AM  Result Value Ref Range   LDH 134 98 - 192 U/L   A/POS/-- (09/26 1434)   MAU Course/MDM: I have ordered labs and reviewed results.  Pt h/a and abdominal pain resolved after vomiting x 1 in MAU without other treatment.  BP initially severe range x1, but normal range to borderline (140s/90s) after pt symptoms improved.  Likely source of abdominal pain/nausea is constipation.  Consult Dr Clearance Coots, will manage constipation with medications/dietary changes.  Plan for follow up BP in office on Monday.  Preeclampsia precautions given.  Pt stable at time of discharge.  Assessment: 1. Constipation during pregnancy in third trimester   2. Elevated blood pressure affecting pregnancy in third trimester, antepartum     Plan: Discharge home with preeclampsia precautions Labor precautions and fetal kick counts Miralax 17g daily or Milk of Magnesia daily Colace 100 mg BID Fleet enema Q 3-4 days PRN High fiber diet, increase PO fluids   Follow-up Information    Follow up with HARPER,CHARLES A, MD.   Specialty:   Obstetrics and Gynecology   Why:  On Monday for BP check, Return to MAU as needed for emergencies   Contact information:   [address] [place] [address] [phone]        Medication List    TAKE these medications        buprenorphine 2 MG Subl SL tablet  Commonly known as:  SUBUTEX  Place 28 mg under the tongue daily.     CONCEPT OB 130-92.4-1 MG Caps  Take 1 tablet by mouth daily.     docusate sodium 100 MG capsule  Commonly known as:  COLACE  Take 1 capsule (100 mg total) by mouth 2 (two) times daily as needed.     labetalol 200 MG tablet  Commonly known as:  NORMODYNE  Take 1 tablet (200 mg total) by mouth 3 (three) times daily.     magnesium hydroxide 400 MG/5ML suspension  Commonly known as:  MILK OF MAGNESIA  Take 30 mLs by mouth daily as needed for mild constipation.     omeprazole 20 MG capsule  Commonly known as:  PRILOSEC  Take 1 capsule (20 mg total) by mouth 2 (two) times daily before a meal.     polyethylene glycol powder powder  Commonly known as:  GLYCOLAX/MIRALAX  Take 17 g by mouth daily.     promethazine 25 MG tablet  Commonly known as:  PHENERGAN  Take 1 tablet (25 mg total) by mouth every 6 (six) hours  as needed for nausea.     valACYclovir 1000 MG tablet  Commonly known as:  VALTREX  Take 1 tablet (1,000 mg total) by mouth as needed (with flare ups).        Sharen Counter Certified Nurse-Midwife 05/11/2015 2:12 PM

## 2015-05-11 NOTE — Progress Notes (Signed)
Subjective:    Megan Edwards is a 40 y.o. female being seen today for her obstetrical visit. She is at [redacted]w[redacted]d gestation. Patient reports headache and dizziness. Fetal movement: normal.  Problem List Items Addressed This Visit    None    Visit Diagnoses    Supervision of high risk pregnancy in third trimester    -  Primary    Relevant Orders    POCT urinalysis dipstick (Completed)      Patient Active Problem List   Diagnosis Date Noted  . Normal delivery 11/28/2012  . Immediate postpartum hemorrhage, with delivery 11/28/2012  . Maternal anemia complicating pregnancy, childbirth, or the puerperium 11/28/2012  . Pregnancy induced hypertension 11/25/2012  . Elderly primigravida, antepartum 06/26/2012  . Methadone dependence (HCC) 08/14/2011  . Polysubstance (excluding opioids) dependence (HCC) 08/11/2011   Objective:    BP 159/99 mmHg  Pulse 109  Temp(Src) 98.1 F (36.7 C)  Wt 218 lb (98.884 kg)  LMP 09/23/2014 FHT:  150 BPM  Uterine Size: size equals dates  Presentation: unsure     Assessment:    Pregnancy @ [redacted]w[redacted]d weeks    Elevated BP  Plan:   Sent to [facility] for further evaluation of elevated BP    labs reviewed, problem list updated Consent signed. GBS sent TDAP offered  Rhogam given for RH negative Pediatrician: discussed. Infant feeding: plans to breastfeed. Maternity leave: discussed. Cigarette smoking: never smoked.  Orders Placed This Encounter  Procedures  . POCT urinalysis dipstick   No orders of the defined types were placed in this encounter.    Follow up in 1 Week.

## 2015-05-11 NOTE — Progress Notes (Signed)
Patient has complaints of headache and just not feeling well today. Per Dr. Clearance Coots patient to go to [facility]U for evaluation.

## 2015-05-11 NOTE — MAU Note (Addendum)
Pt sent from office for elevated b/p's. Stated she has felt "funny " the last 2 days. Feeling a little dizzy and tired. Pt reprots h/a  And pain in her URQas well.

## 2015-05-23 ENCOUNTER — Ambulatory Visit (INDEPENDENT_AMBULATORY_CARE_PROVIDER_SITE_OTHER): Payer: Medicaid Other | Admitting: Obstetrics

## 2015-05-23 VITALS — BP 125/73 | HR 83 | Temp 98.3°F | Wt 215.0 lb

## 2015-05-23 DIAGNOSIS — Z3483 Encounter for supervision of other normal pregnancy, third trimester: Secondary | ICD-10-CM

## 2015-05-24 ENCOUNTER — Encounter: Payer: Self-pay | Admitting: Obstetrics

## 2015-05-24 LAB — STREP B DNA PROBE: GBSP: DETECTED

## 2015-05-24 NOTE — Progress Notes (Signed)
Subjective:    Megan Edwards is a 40 y.o. female being seen today for her obstetrical visit. She is at [redacted]w[redacted]d gestation. Patient reports no complaints. Fetal movement: normal.  Problem List Items Addressed This Visit    None    Visit Diagnoses    Encounter for supervision of other normal pregnancy in third trimester    -  Primary    Relevant Orders    Strep B DNA probe      Patient Active Problem List   Diagnosis Date Noted  . History of postpartum hemorrhage, currently pregnant 05/11/2015  . Immediate postpartum hemorrhage, with delivery 11/28/2012  . Pregnancy induced hypertension 11/25/2012  . Elderly primigravida, antepartum 06/26/2012  . Polysubstance (excluding opioids) dependence (HCC) 08/11/2011   Objective:    BP 125/73 mmHg  Pulse 83  Temp(Src) 98.3 F (36.8 C)  Wt 215 lb (97.523 kg)  LMP 09/23/2014 FHT:  150 BPM  Uterine Size: size equals dates  Presentation: unsure     Assessment:    Pregnancy @ [redacted]w[redacted]d weeks   Plan:     labs reviewed, problem list updated Consent signed. GBS sent TDAP offered  Rhogam given for RH negative Pediatrician: discussed. Infant feeding: plans to breastfeed. Maternity leave: discussed. Cigarette smoking: never smoked. Orders Placed This Encounter  Procedures  . Strep B DNA probe   No orders of the defined types were placed in this encounter.   Follow up in 1 Week.

## 2015-05-30 ENCOUNTER — Encounter: Payer: Medicaid Other | Admitting: Obstetrics

## 2015-06-01 ENCOUNTER — Ambulatory Visit (INDEPENDENT_AMBULATORY_CARE_PROVIDER_SITE_OTHER): Payer: Medicaid Other | Admitting: Obstetrics

## 2015-06-01 VITALS — BP 118/87 | HR 108 | Temp 98.5°F | Wt 220.0 lb

## 2015-06-01 DIAGNOSIS — Z3483 Encounter for supervision of other normal pregnancy, third trimester: Secondary | ICD-10-CM

## 2015-06-01 LAB — POCT URINALYSIS DIPSTICK
Bilirubin, UA: NEGATIVE
Blood, UA: NEGATIVE
Glucose, UA: NEGATIVE
Ketones, UA: NEGATIVE
LEUKOCYTES UA: NEGATIVE
NITRITE UA: NEGATIVE
PH UA: 5
PROTEIN UA: NEGATIVE
Spec Grav, UA: 1.015
Urobilinogen, UA: NEGATIVE

## 2015-06-02 ENCOUNTER — Encounter: Payer: Self-pay | Admitting: Obstetrics

## 2015-06-02 NOTE — Progress Notes (Signed)
Subjective:    Megan Edwards is a 40 y.o. female being seen today for her obstetrical visit. She is at 4440w4d gestation. Patient reports no complaints. Fetal movement: normal.  Problem List Items Addressed This Visit    None    Visit Diagnoses    Encounter for supervision of other normal pregnancy in third trimester    -  Primary    Relevant Orders    POCT urinalysis dipstick (Completed)      Patient Active Problem List   Diagnosis Date Noted  . History of postpartum hemorrhage, currently pregnant 05/11/2015  . Immediate postpartum hemorrhage, with delivery 11/28/2012  . Pregnancy induced hypertension 11/25/2012  . Elderly primigravida, antepartum 06/26/2012  . Polysubstance (excluding opioids) dependence (HCC) 08/11/2011    Objective:    BP 118/87 mmHg  Pulse 108  Temp(Src) 98.5 F (36.9 C)  Wt 220 lb (99.791 kg)  LMP 09/23/2014 FHT: 150 BPM  Uterine Size: size equals dates  Presentations: unsure    Assessment:    Pregnancy @ 2640w4d weeks   Plan:   Plans for delivery: Vaginal anticipated; labs reviewed; problem list updated Counseling: Consent signed. Infant feeding: plans to breastfeed. Cigarette smoking: never smoked. L&D discussion: symptoms of labor, discussed when to call, discussed what number to call, anesthetic/analgesic options reviewed and delivering clinician:  plans no preference. Postpartum supports and preparation: circumcision discussed and contraception plans discussed.  Follow up in 1 Week.

## 2015-06-11 ENCOUNTER — Inpatient Hospital Stay (HOSPITAL_COMMUNITY): Payer: Medicaid Other | Admitting: Anesthesiology

## 2015-06-11 ENCOUNTER — Encounter (HOSPITAL_COMMUNITY): Payer: Self-pay

## 2015-06-11 ENCOUNTER — Inpatient Hospital Stay (HOSPITAL_COMMUNITY)
Admission: AD | Admit: 2015-06-11 | Discharge: 2015-06-15 | DRG: 774 | Disposition: A | Discharge status: Home / Self Care | Payer: Medicaid Other | Source: Ambulatory Visit | Attending: Obstetrics | Admitting: Obstetrics

## 2015-06-11 DIAGNOSIS — O9962 Diseases of the digestive system complicating childbirth: Secondary | ICD-10-CM | POA: Diagnosis present

## 2015-06-11 DIAGNOSIS — K219 Gastro-esophageal reflux disease without esophagitis: Secondary | ICD-10-CM | POA: Diagnosis present

## 2015-06-11 DIAGNOSIS — Z6834 Body mass index (BMI) 34.0-34.9, adult: Secondary | ICD-10-CM

## 2015-06-11 DIAGNOSIS — A6000 Herpesviral infection of urogenital system, unspecified: Secondary | ICD-10-CM | POA: Diagnosis present

## 2015-06-11 DIAGNOSIS — O9832 Other infections with a predominantly sexual mode of transmission complicating childbirth: Secondary | ICD-10-CM | POA: Diagnosis present

## 2015-06-11 DIAGNOSIS — O09512 Supervision of elderly primigravida, second trimester: Secondary | ICD-10-CM

## 2015-06-11 DIAGNOSIS — O99214 Obesity complicating childbirth: Secondary | ICD-10-CM | POA: Diagnosis present

## 2015-06-11 DIAGNOSIS — F329 Major depressive disorder, single episode, unspecified: Secondary | ICD-10-CM | POA: Diagnosis present

## 2015-06-11 DIAGNOSIS — O99344 Other mental disorders complicating childbirth: Secondary | ICD-10-CM | POA: Diagnosis present

## 2015-06-11 DIAGNOSIS — Z3A38 38 weeks gestation of pregnancy: Secondary | ICD-10-CM

## 2015-06-11 DIAGNOSIS — O1002 Pre-existing essential hypertension complicating childbirth: Principal | ICD-10-CM | POA: Diagnosis present

## 2015-06-11 LAB — URINALYSIS, ROUTINE W REFLEX MICROSCOPIC
BILIRUBIN URINE: NEGATIVE
GLUCOSE, UA: NEGATIVE mg/dL
Ketones, ur: NEGATIVE mg/dL
Nitrite: NEGATIVE
Protein, ur: 100 mg/dL — AB
SPECIFIC GRAVITY, URINE: 1.025 (ref 1.005–1.030)
pH: 6 (ref 5.0–8.0)

## 2015-06-11 LAB — COMPREHENSIVE METABOLIC PANEL
ALK PHOS: 116 U/L (ref 38–126)
ALT: 19 U/L (ref 14–54)
ANION GAP: 5 (ref 5–15)
AST: 28 U/L (ref 15–41)
Albumin: 2.7 g/dL — ABNORMAL LOW (ref 3.5–5.0)
BILIRUBIN TOTAL: 0.7 mg/dL (ref 0.3–1.2)
BUN: 12 mg/dL (ref 6–20)
CALCIUM: 8.5 mg/dL — AB (ref 8.9–10.3)
CO2: 23 mmol/L (ref 22–32)
CREATININE: 0.62 mg/dL (ref 0.44–1.00)
Chloride: 107 mmol/L (ref 101–111)
Glucose, Bld: 131 mg/dL — ABNORMAL HIGH (ref 65–99)
Potassium: 3.8 mmol/L (ref 3.5–5.1)
Sodium: 135 mmol/L (ref 135–145)
TOTAL PROTEIN: 6 g/dL — AB (ref 6.5–8.1)

## 2015-06-11 LAB — TYPE AND SCREEN
ABO/RH(D): A POS
Antibody Screen: NEGATIVE

## 2015-06-11 LAB — CBC
HEMATOCRIT: 31.4 % — AB (ref 36.0–46.0)
Hemoglobin: 10.6 g/dL — ABNORMAL LOW (ref 12.0–15.0)
MCH: 26.5 pg (ref 26.0–34.0)
MCHC: 33.8 g/dL (ref 30.0–36.0)
MCV: 78.5 fL (ref 78.0–100.0)
Platelets: 210 10*3/uL (ref 150–400)
RBC: 4 MIL/uL (ref 3.87–5.11)
RDW: 14.3 % (ref 11.5–15.5)
WBC: 14 10*3/uL — AB (ref 4.0–10.5)

## 2015-06-11 LAB — POCT FERN TEST: POCT Fern Test: NEGATIVE

## 2015-06-11 LAB — PROTEIN / CREATININE RATIO, URINE
CREATININE, URINE: 96 mg/dL
Protein Creatinine Ratio: 2.36 mg/mg{Cre} — ABNORMAL HIGH (ref 0.00–0.15)
TOTAL PROTEIN, URINE: 227 mg/dL

## 2015-06-11 LAB — URINE MICROSCOPIC-ADD ON

## 2015-06-11 LAB — RAPID URINE DRUG SCREEN, HOSP PERFORMED
AMPHETAMINES: NOT DETECTED
BARBITURATES: NOT DETECTED
BENZODIAZEPINES: NOT DETECTED
COCAINE: NOT DETECTED
Opiates: NOT DETECTED
TETRAHYDROCANNABINOL: NOT DETECTED

## 2015-06-11 LAB — URIC ACID: URIC ACID, SERUM: 4.9 mg/dL (ref 2.3–6.6)

## 2015-06-11 LAB — LACTATE DEHYDROGENASE: LDH: 168 U/L (ref 98–192)

## 2015-06-11 MED ORDER — MAGNESIUM SULFATE 50 % IJ SOLN
2.0000 g/h | INTRAVENOUS | Status: DC
  Administered 2015-06-11 – 2015-06-12 (×3): 2 g/h via INTRAVENOUS
  Filled 2015-06-11 (×3): qty 80

## 2015-06-11 MED ORDER — PHENYLEPHRINE 40 MCG/ML (10ML) SYRINGE FOR IV PUSH (FOR BLOOD PRESSURE SUPPORT)
80.0000 ug | PREFILLED_SYRINGE | INTRAVENOUS | Status: DC | PRN
  Filled 2015-06-11 (×2): qty 20

## 2015-06-11 MED ORDER — ZOLPIDEM TARTRATE 5 MG PO TABS
5.0000 mg | ORAL_TABLET | Freq: Every evening | ORAL | Status: DC | PRN
  Administered 2015-06-11 – 2015-06-13 (×2): 5 mg via ORAL
  Filled 2015-06-11 (×2): qty 1

## 2015-06-11 MED ORDER — DIPHENHYDRAMINE HCL 50 MG/ML IJ SOLN: 12.5000 mg | INTRAMUSCULAR | Status: DC | PRN

## 2015-06-11 MED ORDER — ONDANSETRON HCL 4 MG/2ML IJ SOLN: 4.0000 mg | Freq: Four times a day (QID) | INTRAMUSCULAR | Status: DC | PRN

## 2015-06-11 MED ORDER — LABETALOL HCL 5 MG/ML IV SOLN: 20.0000 mg | INTRAVENOUS | Status: DC | PRN

## 2015-06-11 MED ORDER — FENTANYL 2.5 MCG/ML BUPIVACAINE 1/10 % EPIDURAL INFUSION (WH - ANES)
14.0000 mL/h | INTRAMUSCULAR | Status: DC | PRN
  Administered 2015-06-11 – 2015-06-12 (×6): 14 mL/h via EPIDURAL
  Filled 2015-06-11 (×6): qty 125

## 2015-06-11 MED ORDER — CITRIC ACID-SODIUM CITRATE 334-500 MG/5ML PO SOLN: 30.0000 mL | ORAL | Status: DC | PRN

## 2015-06-11 MED ORDER — BUTALBITAL-APAP-CAFFEINE 50-325-40 MG PO TABS
2.0000 | ORAL_TABLET | Freq: Four times a day (QID) | ORAL | Status: DC | PRN
  Administered 2015-06-11: 2 via ORAL
  Administered 2015-06-12: 1 via ORAL
  Administered 2015-06-12 (×2): 2 via ORAL
  Filled 2015-06-11: qty 2
  Filled 2015-06-11: qty 1
  Filled 2015-06-11 (×2): qty 2

## 2015-06-11 MED ORDER — MAGNESIUM SULFATE BOLUS VIA INFUSION
4.0000 g | Freq: Once | INTRAVENOUS | Status: AC
Start: 2015-06-11 — End: 2015-06-11
  Administered 2015-06-11: 4 g via INTRAVENOUS
  Filled 2015-06-11: qty 500

## 2015-06-11 MED ORDER — PROMETHAZINE HCL 25 MG/ML IJ SOLN
12.5000 mg | Freq: Four times a day (QID) | INTRAMUSCULAR | Status: DC | PRN
  Administered 2015-06-11: 12.5 mg via INTRAVENOUS
  Filled 2015-06-11: qty 1

## 2015-06-11 MED ORDER — LACTATED RINGERS IV SOLN: 500.0000 mL | Freq: Once | INTRAVENOUS | Status: DC

## 2015-06-11 MED ORDER — MISOPROSTOL 25 MCG QUARTER TABLET
25.0000 ug | ORAL_TABLET | ORAL | Status: DC | PRN
Start: 2015-06-11 — End: 2015-06-13
  Administered 2015-06-11 – 2015-06-12 (×4): 25 ug via VAGINAL
  Filled 2015-06-11 (×5): qty 0.25

## 2015-06-11 MED ORDER — PHENYLEPHRINE 40 MCG/ML (10ML) SYRINGE FOR IV PUSH (FOR BLOOD PRESSURE SUPPORT): 80.0000 ug | PREFILLED_SYRINGE | INTRAVENOUS | Status: DC | PRN

## 2015-06-11 MED ORDER — ACETAMINOPHEN 325 MG PO TABS
650.0000 mg | ORAL_TABLET | ORAL | Status: DC | PRN
  Administered 2015-06-11: 650 mg via ORAL
  Filled 2015-06-11 (×2): qty 2

## 2015-06-11 MED ORDER — EPHEDRINE 5 MG/ML INJ: 10.0000 mg | INTRAVENOUS | Status: DC | PRN

## 2015-06-11 MED ORDER — OXYTOCIN BOLUS FROM INFUSION
500.0000 mL | INTRAVENOUS | Status: DC
  Administered 2015-06-12: 500 mL via INTRAVENOUS

## 2015-06-11 MED ORDER — LABETALOL HCL 200 MG PO TABS
200.0000 mg | ORAL_TABLET | Freq: Three times a day (TID) | ORAL | Status: DC
  Administered 2015-06-11 – 2015-06-12 (×6): 200 mg via ORAL
  Filled 2015-06-11 (×8): qty 1

## 2015-06-11 MED ORDER — FLEET ENEMA 7-19 GM/118ML RE ENEM: 1.0000 | ENEMA | RECTAL | Status: DC | PRN

## 2015-06-11 MED ORDER — LIDOCAINE HCL (PF) 1 % IJ SOLN: 30.0000 mL | INTRAMUSCULAR | Status: DC | PRN

## 2015-06-11 MED ORDER — LACTATED RINGERS IV SOLN
INTRAVENOUS | Status: DC
  Administered 2015-06-11 – 2015-06-12 (×5): via INTRAVENOUS

## 2015-06-11 MED ORDER — CLINDAMYCIN PHOSPHATE 900 MG/50ML IV SOLN
900.0000 mg | Freq: Three times a day (TID) | INTRAVENOUS | Status: DC
  Administered 2015-06-11 – 2015-06-12 (×3): 900 mg via INTRAVENOUS
  Filled 2015-06-11 (×6): qty 50

## 2015-06-11 MED ORDER — OXYCODONE-ACETAMINOPHEN 5-325 MG PO TABS: 2.0000 | ORAL_TABLET | ORAL | Status: DC | PRN

## 2015-06-11 MED ORDER — TERBUTALINE SULFATE 1 MG/ML IJ SOLN: 0.2500 mg | Freq: Once | INTRAMUSCULAR | Status: DC | PRN

## 2015-06-11 MED ORDER — LIDOCAINE HCL (PF) 1 % IJ SOLN
INTRAMUSCULAR | Status: DC | PRN
  Administered 2015-06-11: 3 mL
  Administered 2015-06-11: 2 mL
  Administered 2015-06-11: 5 mL
  Administered 2015-06-12: 4 mL via EPIDURAL

## 2015-06-11 MED ORDER — OXYTOCIN 10 UNIT/ML IJ SOLN
2.5000 [IU]/h | INTRAVENOUS | Status: DC
  Administered 2015-06-12: 2.5 [IU]/h via INTRAVENOUS

## 2015-06-11 MED ORDER — HYDRALAZINE HCL 20 MG/ML IJ SOLN: 10.0000 mg | Freq: Once | INTRAMUSCULAR | Status: DC | PRN

## 2015-06-11 MED ORDER — LACTATED RINGERS IV SOLN
500.0000 mL | Freq: Once | INTRAVENOUS | Status: AC
  Administered 2015-06-11: 500 mL via INTRAVENOUS

## 2015-06-11 MED ORDER — OXYCODONE-ACETAMINOPHEN 5-325 MG PO TABS: 1.0000 | ORAL_TABLET | ORAL | Status: DC | PRN

## 2015-06-11 MED ORDER — CLINDAMYCIN PHOSPHATE 900 MG/50ML IV SOLN
900.0000 mg | Freq: Once | INTRAVENOUS | Status: AC
  Administered 2015-06-11: 900 mg via INTRAVENOUS
  Filled 2015-06-11: qty 50

## 2015-06-11 MED ORDER — LACTATED RINGERS IV SOLN
500.0000 mL | INTRAVENOUS | Status: DC | PRN
  Administered 2015-06-11: 500 mL via INTRAVENOUS

## 2015-06-11 MED ORDER — OXYTOCIN 10 UNIT/ML IJ SOLN: 1.0000 m[IU]/min | INTRAMUSCULAR | Status: DC

## 2015-06-11 MED ORDER — OXYTOCIN 10 UNIT/ML IJ SOLN
1.0000 m[IU]/min | INTRAVENOUS | Status: DC
  Administered 2015-06-11: 1 m[IU]/min via INTRAVENOUS
  Filled 2015-06-11: qty 10

## 2015-06-11 NOTE — Progress Notes (Signed)
Provided education to the patient on pain management options. Patient requesting Phenergan. Explained the importance of reactive FHR prior to administration. Encouraged patient to turn to lateral position  For further assessment prior to admin. Patient refusing all other medication options at this time.

## 2015-06-11 NOTE — Anesthesia Preprocedure Evaluation (Signed)
Anesthesia Evaluation  Patient identified by MRN, date of birth, ID band Patient awake    Reviewed: Allergy & Precautions, Patient's Chart, lab work & pertinent test results  Airway Mallampati: II       Dental   Pulmonary neg pulmonary ROS,    Pulmonary exam normal        Cardiovascular hypertension, Pt. on medications Normal cardiovascular exam     Neuro/Psych Depression On Magnesium negative neurological ROS     GI/Hepatic GERD  ,(+)     substance abuse (on Subutex)  ,   Endo/Other  Morbid obesity  Renal/GU negative Renal ROS     Musculoskeletal   Abdominal   Peds  Hematology  (+) anemia ,   Anesthesia Other Findings   Reproductive/Obstetrics                             Lab Results  Component Value Date   WBC 14.0* 06/11/2015   HGB 10.6* 06/11/2015   HCT 31.4* 06/11/2015   MCV 78.5 06/11/2015   PLT 210 06/11/2015   Lab Results  Component Value Date   CREATININE 0.62 06/11/2015   BUN 12 06/11/2015   NA 135 06/11/2015   K 3.8 06/11/2015   CL 107 06/11/2015   CO2 23 06/11/2015    Anesthesia Physical Anesthesia Plan  ASA: III  Anesthesia Plan: Epidural   Post-op Pain Management:    Induction:   Airway Management Planned:   Additional Equipment:   Intra-op Plan:   Post-operative Plan:   Informed Consent: I have reviewed the patients History and Physical, chart, labs and discussed the procedure including the risks, benefits and alternatives for the proposed anesthesia with the patient or authorized representative who has indicated his/her understanding and acceptance.     Plan Discussed with:   Anesthesia Plan Comments:         Anesthesia Quick Evaluation

## 2015-06-11 NOTE — H&P (Signed)
Megan Edwards is a 40 y.o. female presenting for UC's. Maternal Medical History:  Reason for admission: Contractions.   Contractions: Frequency: regular.   Perceived severity is moderate.    Fetal activity: Perceived fetal activity is normal.   Last perceived fetal movement was within the past hour.    Prenatal Complications - Diabetes: none.    OB History    Gravida Para Term Preterm AB TAB SAB Ectopic Multiple Living   5 1 1  0 3 2 1  0 0 1     Past Medical History  Diagnosis Date  . Depression   . Genital herpes 2000  . Infection     UTI  . Ovarian cyst   . Abnormal Pap smear   . SVT (supraventricular tachycardia) (HCC)   . Hypertension    Past Surgical History  Procedure Laterality Date  . Knee surgery    . Breast enhancement surgery    . Tonsillectomy    . Addenoidectomy    . Wisdom tooth extraction    . Breast surgery    . Knee surgery Left    Family History: family history is not on file. Social History:  reports that she has never smoked. She has never used smokeless tobacco. She reports that she does not drink alcohol or use illicit drugs.   Prenatal Transfer Tool  Maternal Diabetes: No Genetic Screening: Normal Maternal Ultrasounds/Referrals: Normal Fetal Ultrasounds or other Referrals:  Referred to Materal Fetal Medicine  Maternal Substance Abuse:  No Significant Maternal Medications:  Meds include: Other: Labetalol Significant Maternal Lab Results:  None Other Comments:  None  Review of Systems  All other systems reviewed and are negative.   Dilation: 3 Effacement (%): 80 Station: -2 Exam by:: [employer] Omaha[employer]ji Stanley RN Blood pressure 146/95, pulse 99, temperature 97.6 F (36.4 C), resp. rate 16, last menstrual period 09/23/2014, SpO2 94 %. Maternal Exam:  Uterine Assessment: Contraction strength is moderate.  Contraction frequency is regular.   Abdomen: Patient reports no abdominal tenderness. Fetal presentation: vertex  Introitus: Normal  vulva. Normal vagina.  Pelvis: adequate for delivery.   Cervix: Cervix evaluated by digital exam.     Physical Exam  Nursing note and vitals reviewed. Constitutional: She is oriented to person, place, and time. She appears well-developed and well-nourished.  HENT:  Head: Normocephalic and atraumatic.  Eyes: Conjunctivae are normal. Pupils are equal, round, and reactive to light.  Neck: Normal range of motion. Neck supple.  Cardiovascular: Normal rate and regular rhythm.   Respiratory: Effort normal and breath sounds normal.  GI: Soft. Bowel sounds are normal.  Genitourinary: Vagina normal and uterus normal.  Musculoskeletal: Normal range of motion.  Neurological: She is alert and oriented to person, place, and time.  Skin: Skin is warm and dry.  Psychiatric: She has a normal mood and affect. Her behavior is normal. Judgment and thought content normal.    Prenatal labs: ABO, Rh: A/POS/-- (09/26 1434) Antibody: NEG (09/26 1434) Rubella: 3.35 (09/26 1434) RPR: NON REAC (09/26 1434)  HBsAg: NEGATIVE (09/26 1434)  HIV: NONREACTIVE (09/26 1434)  GBS: DETECTED (02/28 1546)   Assessment/Plan: 38 weeks.  Active labor.  Admit.   HARPER,CHARLES A 06/11/2015, 5:28 AM

## 2015-06-11 NOTE — Anesthesia Procedure Notes (Addendum)
Epidural Patient location during procedure: OB  Staffing Anesthesiologist: Marcene DuosFITZGERALD, ROBERT Performed by: anesthesiologist   Preanesthetic Checklist Completed: patient identified, site marked, surgical consent, pre-op evaluation, timeout performed, IV checked, risks and benefits discussed and monitors and equipment checked  Epidural Patient position: sitting Prep: site prepped and draped and DuraPrep Patient monitoring: continuous pulse ox and blood pressure Approach: midline Location: L4-L5 Injection technique: LOR saline  Needle:  Needle type: Tuohy  Needle gauge: 17 G Needle length: 9 cm and 9 Needle insertion depth: 7 cm Catheter type: closed end flexible Catheter size: 19 Gauge Catheter at skin depth: 13 (12cm at skin initially. Advanced to 13cm when pt laid in left lat decub position.) cm Test dose: negative  Assessment Events: blood not aspirated, injection not painful, no injection resistance, negative IV test and no paresthesia  Epidural Patient location during procedure: OB Start time: 06/12/2015 12:00 PM End time: 06/12/2015 12:06 PM  Staffing Anesthesiologist: Shona SimpsonHOLLIS, Cleaven Demario D Performed by: anesthesiologist   Preanesthetic Checklist Completed: patient identified, site marked, surgical consent, pre-op evaluation, timeout performed, IV checked, risks and benefits discussed and monitors and equipment checked  Epidural Patient position: sitting Prep: ChloraPrep Patient monitoring: heart rate, continuous pulse ox and blood pressure Approach: midline Location: L3-L4 Injection technique: LOR saline  Needle:  Needle type: Tuohy  Needle gauge: 17 G Needle length: 9 cm Catheter type: closed end flexible Catheter size: 20 Guage Test dose: negative and 1.5% lidocaine  Assessment Events: blood not aspirated, injection not painful, no injection resistance and no paresthesia  Additional Notes Initial epidural removed due to no level and catheter pulled out  to 6 at skin.   LOR @ 6  Patient identified. Risks/Benefits/Options discussed with patient including but not limited to bleeding, infection, nerve damage, paralysis, failed block, incomplete pain control, headache, blood pressure changes, nausea, vomiting, reactions to medications, itching and postpartum back pain. Confirmed with bedside nurse the patient's most recent platelet count. Confirmed with patient that they are not currently taking any anticoagulation, have any bleeding history or any family history of bleeding disorders. Patient expressed understanding and wished to proceed. All questions were answered. Sterile technique was used throughout the entire procedure. Please see nursing notes for vital signs. Test dose was given through epidural catheter and negative prior to continuing to dose epidural or start infusion. Warning signs of high block given to the patient including shortness of breath, tingling/numbness in hands, complete motor block, or any concerning symptoms with instructions to call for help. Patient was given instructions on fall risk and not to get out of bed. All questions and concerns addressed with instructions to call with any issues or inadequate analgesia.    Reason for block:procedure for pain

## 2015-06-11 NOTE — MAU Note (Signed)
Contractions all day, every 1,2, 3 min apart.  No bleeding. 1 episode of watery d/c went through pants, clearish at 2:00 pm. Baby moving well.

## 2015-06-12 ENCOUNTER — Encounter (HOSPITAL_COMMUNITY): Payer: Self-pay | Admitting: *Deleted

## 2015-06-12 ENCOUNTER — Encounter: Payer: Medicaid Other | Admitting: Obstetrics

## 2015-06-12 LAB — CBC
HCT: 34.9 % — ABNORMAL LOW (ref 36.0–46.0)
Hemoglobin: 11.5 g/dL — ABNORMAL LOW (ref 12.0–15.0)
MCH: 26.4 pg (ref 26.0–34.0)
MCHC: 33 g/dL (ref 30.0–36.0)
MCV: 80 fL (ref 78.0–100.0)
PLATELETS: 209 10*3/uL (ref 150–400)
RBC: 4.36 MIL/uL (ref 3.87–5.11)
RDW: 14.7 % (ref 11.5–15.5)
WBC: 19.1 10*3/uL — ABNORMAL HIGH (ref 4.0–10.5)

## 2015-06-12 LAB — RPR: RPR: NONREACTIVE

## 2015-06-12 MED ORDER — BUPRENORPHINE HCL 2 MG SL SUBL: 2.0000 mg | SUBLINGUAL_TABLET | Freq: Every day | SUBLINGUAL | Status: DC

## 2015-06-12 MED ORDER — BUPRENORPHINE HCL 8 MG SL SUBL
28.0000 mg | SUBLINGUAL_TABLET | SUBLINGUAL | Status: DC
  Administered 2015-06-12: 28 mg via SUBLINGUAL
  Filled 2015-06-12: qty 3.5
  Filled 2015-06-12: qty 4

## 2015-06-12 NOTE — Progress Notes (Signed)
[facility]  [facility].  Discovered pt had own dose of subutex in hand when assessing this AM.  Confiscated dose.  Stressed importance of hospital managing all her drugs while she is an inpatient.  Pharmacy aware of issue.  MD notified.

## 2015-06-12 NOTE — Progress Notes (Signed)
Megan Edwards is a 40 y.o. G5P1031 at 7463w0d by LMP admitted for induction of labor due to Hypertension.  Subjective:   Objective: BP 126/84 mmHg  Pulse 68  Temp(Src) 97.7 F (36.5 C) (Oral)  Resp 10  Ht 5\' 7"  (1.702 m)  Wt 220 lb (99.791 kg)  BMI 34.45 kg/m2  SpO2 100%  LMP 09/23/2014 I/O last 3 completed shifts: In: 4520.2 [P.O.:1490; I.V.:2557.8; Other:322.5; IV Piggyback:150] Out: 3810 [Urine:3810]    FHT:  FHR: 120 bpm, variability: moderate,  accelerations:  Present,  decelerations:  Absent UC:   irregular, every 6-10 minutes SVE:   Dilation: 4 Effacement (%): 60 Station: -2 Exam by:: aleasha walker,rn  Labs: Lab Results  Component Value Date   WBC 14.0* 06/11/2015   HGB 10.6* 06/11/2015   HCT 31.4* 06/11/2015   MCV 78.5 06/11/2015   PLT 210 06/11/2015    Assessment / Plan: Induction of labor due to preeclampsia,  progressing well on pitocin  Labor: None Preeclampsia:  on magnesium sulfate, no signs or symptoms of toxicity, intake and ouput balanced and labs stable Fetal Wellbeing:  Category I Pain Control:  Epidural I/D:  n/a Anticipated MOD:  NSVD  HARPER,CHARLES A 06/12/2015, 7:17 AM

## 2015-06-13 LAB — CBC
HEMATOCRIT: 30.4 % — AB (ref 36.0–46.0)
Hemoglobin: 10.1 g/dL — ABNORMAL LOW (ref 12.0–15.0)
MCH: 26.4 pg (ref 26.0–34.0)
MCHC: 33.2 g/dL (ref 30.0–36.0)
MCV: 79.6 fL (ref 78.0–100.0)
PLATELETS: 190 10*3/uL (ref 150–400)
RBC: 3.82 MIL/uL — ABNORMAL LOW (ref 3.87–5.11)
RDW: 14.9 % (ref 11.5–15.5)
WBC: 13 10*3/uL — ABNORMAL HIGH (ref 4.0–10.5)

## 2015-06-13 LAB — CCBB MATERNAL DONOR DRAW

## 2015-06-13 MED ORDER — ONDANSETRON HCL 4 MG/2ML IJ SOLN: 4.0000 mg | INTRAMUSCULAR | Status: DC | PRN

## 2015-06-13 MED ORDER — PRENATAL MULTIVITAMIN CH
1.0000 | ORAL_TABLET | Freq: Every day | ORAL | Status: DC
  Administered 2015-06-13 – 2015-06-15 (×3): 1 via ORAL
  Filled 2015-06-13 (×3): qty 1

## 2015-06-13 MED ORDER — LACTATED RINGERS IV SOLN
INTRAVENOUS | Status: DC
  Administered 2015-06-14: 02:00:00 via INTRAVENOUS

## 2015-06-13 MED ORDER — OXYCODONE-ACETAMINOPHEN 5-325 MG PO TABS: 1.0000 | ORAL_TABLET | ORAL | Status: DC | PRN

## 2015-06-13 MED ORDER — ONDANSETRON HCL 4 MG PO TABS
4.0000 mg | ORAL_TABLET | ORAL | Status: DC | PRN
Start: 2015-06-13 — End: 2015-06-15

## 2015-06-13 MED ORDER — LANOLIN HYDROUS EX OINT: TOPICAL_OINTMENT | CUTANEOUS | Status: DC | PRN

## 2015-06-13 MED ORDER — SIMETHICONE 80 MG PO CHEW: 80.0000 mg | CHEWABLE_TABLET | ORAL | Status: DC | PRN

## 2015-06-13 MED ORDER — TETANUS-DIPHTH-ACELL PERTUSSIS 5-2.5-18.5 LF-MCG/0.5 IM SUSP
0.5000 mL | Freq: Once | INTRAMUSCULAR | Status: AC
  Administered 2015-06-13: 0.5 mL via INTRAMUSCULAR
  Filled 2015-06-13 (×2): qty 0.5

## 2015-06-13 MED ORDER — DIBUCAINE 1 % RE OINT
1.0000 "application " | TOPICAL_OINTMENT | RECTAL | Status: DC | PRN
  Filled 2015-06-13: qty 28

## 2015-06-13 MED ORDER — BENZOCAINE-MENTHOL 20-0.5 % EX AERO
1.0000 "application " | INHALATION_SPRAY | CUTANEOUS | Status: DC | PRN
  Filled 2015-06-13: qty 56

## 2015-06-13 MED ORDER — BUTALBITAL-APAP-CAFFEINE 50-325-40 MG PO TABS
2.0000 | ORAL_TABLET | ORAL | Status: DC | PRN
  Administered 2015-06-13 – 2015-06-15 (×4): 2 via ORAL
  Filled 2015-06-13 (×4): qty 2

## 2015-06-13 MED ORDER — OXYTOCIN 10 UNIT/ML IJ SOLN
2.5000 [IU]/h | INTRAVENOUS | Status: DC | PRN
  Administered 2015-06-13: 2.5 [IU]/h via INTRAVENOUS
  Filled 2015-06-13: qty 10

## 2015-06-13 MED ORDER — DIPHENHYDRAMINE HCL 25 MG PO CAPS: 25.0000 mg | ORAL_CAPSULE | Freq: Four times a day (QID) | ORAL | Status: DC | PRN

## 2015-06-13 MED ORDER — LABETALOL HCL 200 MG PO TABS
200.0000 mg | ORAL_TABLET | Freq: Three times a day (TID) | ORAL | Status: DC
  Administered 2015-06-13 – 2015-06-15 (×7): 200 mg via ORAL
  Filled 2015-06-13 (×8): qty 1

## 2015-06-13 MED ORDER — ZOLPIDEM TARTRATE 5 MG PO TABS
5.0000 mg | ORAL_TABLET | Freq: Every evening | ORAL | Status: DC | PRN
  Administered 2015-06-13: 5 mg via ORAL
  Filled 2015-06-13: qty 1

## 2015-06-13 MED ORDER — WITCH HAZEL-GLYCERIN EX PADS: 1.0000 "application " | MEDICATED_PAD | CUTANEOUS | Status: DC | PRN

## 2015-06-13 MED ORDER — HYDRALAZINE HCL 20 MG/ML IJ SOLN: 10.0000 mg | Freq: Once | INTRAMUSCULAR | Status: DC | PRN

## 2015-06-13 MED ORDER — MAGNESIUM SULFATE 50 % IJ SOLN
2.0000 g/h | INTRAVENOUS | Status: DC
  Administered 2015-06-13: 2 g/h via INTRAVENOUS
  Filled 2015-06-13: qty 80

## 2015-06-13 MED ORDER — SENNOSIDES-DOCUSATE SODIUM 8.6-50 MG PO TABS
2.0000 | ORAL_TABLET | ORAL | Status: DC
  Administered 2015-06-13 – 2015-06-14 (×3): 2 via ORAL
  Filled 2015-06-13 (×3): qty 2

## 2015-06-13 MED ORDER — ACETAMINOPHEN 325 MG PO TABS: 650.0000 mg | ORAL_TABLET | ORAL | Status: DC | PRN

## 2015-06-13 MED ORDER — LABETALOL HCL 5 MG/ML IV SOLN
20.0000 mg | INTRAVENOUS | Status: DC | PRN
  Administered 2015-06-13: 20 mg via INTRAVENOUS
  Filled 2015-06-13: qty 4

## 2015-06-13 MED ORDER — OXYCODONE-ACETAMINOPHEN 5-325 MG PO TABS: 2.0000 | ORAL_TABLET | ORAL | Status: DC | PRN

## 2015-06-13 MED ORDER — IBUPROFEN 600 MG PO TABS
600.0000 mg | ORAL_TABLET | Freq: Four times a day (QID) | ORAL | Status: DC
  Administered 2015-06-13 – 2015-06-15 (×10): 600 mg via ORAL
  Filled 2015-06-13 (×10): qty 1

## 2015-06-13 MED ORDER — BUPRENORPHINE HCL 8 MG SL SUBL
28.0000 mg | SUBLINGUAL_TABLET | SUBLINGUAL | Status: DC
  Administered 2015-06-13 – 2015-06-15 (×3): 28 mg via SUBLINGUAL
  Filled 2015-06-13 (×2): qty 3.5
  Filled 2015-06-13: qty 4

## 2015-06-13 NOTE — Progress Notes (Signed)
Dr. Clearance CootsHarper called to inform of patients BP's and the patient status of getting up and down. At this time no new orders given.

## 2015-06-13 NOTE — Progress Notes (Signed)
Pt called out for help. RN arrives to room to find patient standing at the bedside. Patient has been instructed by LD RN to not get up without assistance. Nursery RN also instructed patient to not get up when she arrived to the room and found the patient standing. While at the bedside RN found a pill laying on the floor beside the bed. Patient states "Mother took her pills home and must have dropped one". RN looked around the room and no other pills found. RN sent the pill in a bag to the Pharmacy to waste per Thurston HoleAnne, Pharmacist.  Foley catheter removed because patient keeps getting up and leaving it attached to the bed. Patient instructed on the importance of calling for help when getting up out of bed.

## 2015-06-13 NOTE — Progress Notes (Signed)
Post Partum Day 1 Subjective: no complaints  Objective: Blood pressure 117/63, pulse 65, temperature 97.5 F (36.4 C), temperature source Oral, resp. rate 14, height 5\' 7"  (1.702 m), weight 220 lb (99.791 kg), last menstrual period 09/23/2014, SpO2 100 %, unknown if currently breastfeeding.  Physical Exam:  General: alert and no distress Lochia: appropriate Uterine Fundus: firm Incision: healing well DVT Evaluation: No evidence of DVT seen on physical exam.   Recent Labs  06/12/15 2258 06/13/15 0512  HGB 11.5* 10.1*  HCT 34.9* 30.4*    Assessment/Plan: Plan for discharge tomorrow   LOS: 2 days   Megan Edwards A 06/13/2015, 7:31 AM

## 2015-06-13 NOTE — Anesthesia Postprocedure Evaluation (Signed)
Anesthesia Post Note  Patient: Megan Edwards  Procedure(s) Performed: * No procedures listed *  Patient location during evaluation: Mother Baby Anesthesia Type: Epidural Level of consciousness: awake and alert Pain management: satisfactory to patient Vital Signs Assessment: post-procedure vital signs reviewed and stable Respiratory status: respiratory function stable Cardiovascular status: stable Postop Assessment: no headache, no backache, epidural receding, patient able to bend at knees, no signs of nausea or vomiting and adequate PO intake Anesthetic complications: no Comments: Comfort level was assessed by AnesthesiaTeam and the patient was pleased with the care, interventions, and services provided by the Department of Anesthesia.    Last Vitals:  Filed Vitals:   06/13/15 1831 06/13/15 1851  BP: 149/87 161/94  Pulse: 79 74  Temp:    Resp:  18    Last Pain:  Filed Vitals:   06/13/15 1936  PainSc: 5                  Graesyn Schreifels

## 2015-06-14 MED ORDER — LACTATED RINGERS IV BOLUS (SEPSIS): 300.0000 mL | Freq: Once | INTRAVENOUS | Status: AC

## 2015-06-14 NOTE — Progress Notes (Signed)
CSW and MSW intern attempted to meet with MOB to offer support and to complete psychosocial assessment due to mental health history and current participation in Subutex program.  MOB requested a visit at another time due to about to take a shower. CSW made second attempt, but noted numerous visitors in her room.  CSW to follow up on 3/23.   Loleta BooksSarah Keilon Ressel MSW, LCSW

## 2015-06-14 NOTE — Progress Notes (Signed)
Assumed care from Moira Dailey RN. 

## 2015-06-14 NOTE — Progress Notes (Signed)
Post Partum Day 2 Subjective: no complaints  Objective: Blood pressure 127/88, pulse 95, temperature 98.1 F (36.7 C), temperature source Oral, resp. rate 16, height 5\' 7"  (1.702 m), weight 223 lb 1.9 oz (101.207 kg), last menstrual period 09/23/2014, SpO2 97 %, unknown if currently breastfeeding.  Physical Exam:  General: alert and no distress Lochia: appropriate Uterine Fundus: firm Incision: none DVT Evaluation: No evidence of DVT seen on physical exam.   Recent Labs  06/12/15 2258 06/13/15 0512  HGB 11.5* 10.1*  HCT 34.9* 30.4*    Assessment/Plan: Chronic hypertension.  BP stable off magnesium sulfate.  Continue Labetalol. Plan for discharge tomorrow and Social Work consult   LOS: 3 days   Megan Edwards A 06/14/2015, 5:22 PM

## 2015-06-15 MED ORDER — OXYCODONE-ACETAMINOPHEN 5-325 MG PO TABS: 1.0000 | ORAL_TABLET | ORAL | Status: DC | PRN

## 2015-06-15 MED ORDER — IBUPROFEN 600 MG PO TABS: 600.0000 mg | ORAL_TABLET | Freq: Four times a day (QID) | ORAL | Status: DC | PRN

## 2015-06-15 NOTE — Progress Notes (Signed)
Pt verbalizes understanding of d/c instructions,  Medications, follow up appts, when to seek medical attention, and belongings policy. Pt was encouraged to check the room thoroughly for her belongings. Pt was given her subutex medication prior to leaving. Pt was given a copy of AVS, d/c instructions and her prescription.  IV was d/c by NT. Pt has no questions at time of d/c. Pts baby will remain a pt,so [facility] where [facility] baby staff will resume care of infant. Mother d/c at this time. Sheryn BisonGordon, Rex Oesterle Warner

## 2015-06-15 NOTE — Clinical Social Work Maternal (Signed)
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 Mental Health Concerns--MOB presents with a history of depression and bipolar, with last participation in outpatient treatment one year ago. MOB endorsed presence of anxiety and bipolar symptoms during the pregnancy.  2.  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  Cognitive State:  Flight of Ideas , Racing Thoughts , Distractible , Tangential   Mood/Affect:  Happy , Bright    CSW Assessment:  CSW received request for consult due to MOB presenting with a history of depression, bipolar, and current participation in a Subutex program.  MOB was alone in her room during the assessment.   MOB was easily engaged, was receptive to the visit, and required minimal time to establish rapport. She was pleasant, and displayed a full range in affect.  MOB was  observed to be frequently moving and fidgeting in her seat. Her thought process was tangential, she required frequent re-direction, and speech was pressured.   MOB endorsed feelings of happiness and excitement secondary to the infant's birth. She stated that she feels more prepared and confident as she transitions postpartum with this infant. MOB discussed at length now knowing what to anticipate with her first son (Megan Edwards, age 2). She shared that she was overwhelmed as a first time mother, having few basic needs met, and not having a lot of information about NAS.  MOB reported that she experienced a lot of feelings of guilt due to Megan Edwards requiring a transfer to the NICU for treatment, and shared that there were a lot of unknowns that triggered anxiety, worries, and fears.  MOB was able to reflect upon this experience also as positive since she was able to learn a lot that has helped to reduce anxieties during this pregnancy.  She stated that she knows what to monitor for in regards to NAS, knows that this infant will be okay, and she has increased her confidence in regards to parenting and knowing that she was participating in the Subutex program because she knows that it was best for her and the infant.  Despite increase in confidence, she still identified ongoing feelings of guilt since she does not want the infant to experience withdrawal.  MOB shared that she knows the situation is only temporary and that the infant will be okay. MOB recognized how she can   utilize positive self-talk and what she has previously learned in order to increase her confidence and reduce feelings of guilt.   Per MOB, she has been participating in Subutex programs on and off for approximately 8 years. She stated that she has a history of knee surgery, and was prescribed pain medications. MOB did not discuss the exact events that led to her decision to participate in a Subutex program, but discussed belief that is a positive program  for her since she is not on the streets requesting medications that are not prescribed to her of an unknown origin.  MOB shared that she is a current patient at [facility], but is on the wait list for other programs. She stated that now that she is no longer pregnant, she intends to change practices.   CSW reviewed education on NAS. MOB expressed gratitude that the infant's NAS scores are low, but also acknowledged that scores may still increase. She shared that she is familiar with the minimum 5 day stay, and denied any concerns about needing to stay. MOB shared that she will need to go to her clinic tomorrow for her dose since she has been discharged, and was informed of her ability to have an adult caregiver in the room with the infant or for the infant to be brought to the nursery while she is at the clinic.  CSW reviewed the process of infant becoming a "baby patient", and she denied questions, concerns, or needs.   MOB confirmed history of depression and bipolar. MOB stated that she has a history of being prescribed Seroquel, but denied any medication for the past year since she could not afford it.  MOB reported interest in a new referral for mental health providers now that she has Medicaid since she identifies her mental health as an area of need.  Per MOB, she believes that she has anxiety, and is concerned about how her anxiety may increase postpartum.  CSW reviewed MOB's mood symptoms during the pregnancy. Per MOB, she experienced "mania", and reported that there were periods of time when she did not feel the need for sleep. She stated that she has been told that she is engaging in risky behavior (but she did not identify specific behaviors).  CSW noted rapid speech, tangential thoughts, and loose association of ideas.  MOB reported that she can also be impulsive in her decision making.   MOB shared that she was anxious that she may experience symptoms of mania and public, and often felt the  need to isolate and withdraw from society. She stated that she felt "numb", and denied expression of negative emotions. MOB shared that she felt a decreased interest in daily activities, but denied feelings of being hopeless, helpless, and SI.   MOB denied history of postpartum depression, but without prompting, expressed concern about her risk with this current pregnancy.  MOB was receptive to re-education on the baby blues and perinatal mood disorders. MOB expressed appreciation for the new referral that was offered by CSW.  MOB shared that she is proud of her ability to participate in her Subutex program. She stated that she has been compliant with the program, and has prioritized paying for her medications (approximately $800 per month).  MOB shared that it was difficult at times due to the cost, and need to adjust medications during the pregnancy; however, she again discussed gratitude for the program and the medication since she has been able to have children safely.  MOB shared that   she is happy and content with her life, and never imagined that she would be the mother of two children. MOB stated that in early adulthood, she had a high-status job with a high salary, but discussed how she was not happy. She stated that although she is now unemployed, she is happier.  MOB shared that she has been able to achieve a life worth living, and is content with how her life has now unfolded.    MOB confirmed that the home is prepared for the infant. She stated that she will need assistance with securing a car seat.  MOB inquired about the car seat program. CSW provided information, and she requested to participate. Per MOB, she did not participate in the program with her first child.   CSW provided information on the hospital drug screen policy.  MOB denied questions or concerns. MOB informed that the infant's UDS is positive for barbiturates, but stated that she was prescribed Fioricet.  MOB aware that the  umbilical cord is pending, and she denied questions or concerns.   CSW Plan/Description:   1. Patient/Family Education-- Perinatal mood and anxiety disorders, hospital drug screen policy 2. Information/Referral to Community Resources-- Outpatient mental health resources (medication management and therapy)  3. CSW to monitor infant's toxicology screen, and will refer to CPS if positive for unprescribed substances. 4. No Further Intervention Required/No Barriers to Discharge    Welby Montminy N, LCSW 06/15/2015, 10:44 AM  

## 2015-06-15 NOTE — Discharge Summary (Signed)
Obstetric Discharge Summary Reason for Admission: induction of labor Prenatal Procedures: NST and ultrasound Intrapartum Procedures: spontaneous vaginal delivery Postpartum Procedures: none Complications-Operative and Postpartum: none HEMOGLOBIN  Date Value Ref Range Status  06/13/2015 10.1* 12.0 - 15.0 g/dL Final   HCT  Date Value Ref Range Status  06/13/2015 30.4* 36.0 - 46.0 % Final    Physical Exam:  General: alert and no distress Lochia: appropriate Uterine Fundus: firm Incision: none DVT Evaluation: No evidence of DVT seen on physical exam.  Discharge Diagnoses: Term Pregnancy-delivered  Discharge Information: Date: 06/15/2015 Activity: pelvic rest Diet: routine Medications: PNV, Ibuprofen, Colace and Percocet Condition: stable Instructions: refer to practice specific booklet Discharge to: home Follow-up Information    Follow up with HARPER,CHARLES A, MD. Schedule an appointment as soon as possible for a visit in 2 weeks.   Specialty:  Obstetrics and Gynecology   Contact information:   [address] [place] [address] [phone]       Newborn Data: Live born female  Birth Weight: 6 lb 14.4 oz (3130 g) APGAR: 8, 9  Home with mother.  HARPER,CHARLES A 06/15/2015, 9:03 AM

## 2015-06-15 NOTE — Progress Notes (Signed)
Post Partum Day 3 Subjective: no complaints  Objective: Blood pressure 146/78, pulse 72, temperature 98.5 F (36.9 C), temperature source Oral, resp. rate 16, height 5\' 7"  (1.702 m), weight 217 lb (98.431 kg), last menstrual period 09/23/2014, SpO2 98 %, unknown if currently breastfeeding.  Physical Exam:  General: alert and no distress Lochia: appropriate Uterine Fundus: firm Incision: none DVT Evaluation: No evidence of DVT seen on physical exam.   Recent Labs  06/12/15 2258 06/13/15 0512  HGB 11.5* 10.1*  HCT 34.9* 30.4*    Assessment/Plan: Discharge home   LOS: 4 days   HARPER,CHARLES A 06/15/2015, 8:56 AM

## 2015-08-04 ENCOUNTER — Emergency Department (HOSPITAL_COMMUNITY)
Admission: EM | Admit: 2015-08-04 | Discharge: 2015-08-04 | Disposition: A | Discharge status: Home / Self Care | Payer: Medicaid Other | Attending: Emergency Medicine | Admitting: Emergency Medicine

## 2015-08-04 ENCOUNTER — Encounter (HOSPITAL_COMMUNITY): Payer: Self-pay | Admitting: Emergency Medicine

## 2015-08-04 DIAGNOSIS — Z8619 Personal history of other infectious and parasitic diseases: Secondary | ICD-10-CM | POA: Insufficient documentation

## 2015-08-04 DIAGNOSIS — I10 Essential (primary) hypertension: Secondary | ICD-10-CM | POA: Diagnosis not present

## 2015-08-04 DIAGNOSIS — R112 Nausea with vomiting, unspecified: Secondary | ICD-10-CM | POA: Insufficient documentation

## 2015-08-04 DIAGNOSIS — Z3202 Encounter for pregnancy test, result negative: Secondary | ICD-10-CM | POA: Insufficient documentation

## 2015-08-04 DIAGNOSIS — Z87440 Personal history of urinary (tract) infections: Secondary | ICD-10-CM | POA: Insufficient documentation

## 2015-08-04 DIAGNOSIS — F329 Major depressive disorder, single episode, unspecified: Secondary | ICD-10-CM | POA: Diagnosis not present

## 2015-08-04 DIAGNOSIS — Z8742 Personal history of other diseases of the female genital tract: Secondary | ICD-10-CM | POA: Insufficient documentation

## 2015-08-04 DIAGNOSIS — Z880 Allergy status to penicillin: Secondary | ICD-10-CM | POA: Insufficient documentation

## 2015-08-04 DIAGNOSIS — Z79899 Other long term (current) drug therapy: Secondary | ICD-10-CM | POA: Insufficient documentation

## 2015-08-04 LAB — COMPREHENSIVE METABOLIC PANEL
ALBUMIN: 3.8 g/dL (ref 3.5–5.0)
ALK PHOS: 93 U/L (ref 38–126)
ALT: 29 U/L (ref 14–54)
ANION GAP: 14 (ref 5–15)
AST: 27 U/L (ref 15–41)
BILIRUBIN TOTAL: 0.6 mg/dL (ref 0.3–1.2)
BUN: 16 mg/dL (ref 6–20)
CALCIUM: 8.7 mg/dL — AB (ref 8.9–10.3)
CO2: 31 mmol/L (ref 22–32)
Chloride: 95 mmol/L — ABNORMAL LOW (ref 101–111)
Creatinine, Ser: 0.83 mg/dL (ref 0.44–1.00)
GFR calc Af Amer: >60 mL/min (ref >60)
GFR calc non Af Amer: >60 mL/min (ref >60)
GLUCOSE: 140 mg/dL — AB (ref 65–99)
Potassium: 3.5 mmol/L (ref 3.5–5.1)
Sodium: 140 mmol/L (ref 135–145)
TOTAL PROTEIN: 6.8 g/dL (ref 6.5–8.1)

## 2015-08-04 LAB — URINE MICROSCOPIC-ADD ON: Bacteria, UA: NONE SEEN

## 2015-08-04 LAB — URINALYSIS, ROUTINE W REFLEX MICROSCOPIC
BILIRUBIN URINE: NEGATIVE
Glucose, UA: NEGATIVE mg/dL
KETONES UR: NEGATIVE mg/dL
Leukocytes, UA: NEGATIVE
NITRITE: NEGATIVE
Protein, ur: 100 mg/dL — AB
Specific Gravity, Urine: 1.025 (ref 1.005–1.030)
pH: 8 (ref 5.0–8.0)

## 2015-08-04 LAB — CBC
HCT: 41.4 % (ref 36.0–46.0)
HEMOGLOBIN: 14.3 g/dL (ref 12.0–15.0)
MCH: 27.3 pg (ref 26.0–34.0)
MCHC: 34.5 g/dL (ref 30.0–36.0)
MCV: 79.2 fL (ref 78.0–100.0)
PLATELETS: 201 10*3/uL (ref 150–400)
RBC: 5.23 MIL/uL — ABNORMAL HIGH (ref 3.87–5.11)
RDW: 16.2 % — AB (ref 11.5–15.5)
WBC: 5.2 10*3/uL (ref 4.0–10.5)

## 2015-08-04 LAB — I-STAT BETA HCG BLOOD, ED (MC, WL, AP ONLY)

## 2015-08-04 LAB — LIPASE, BLOOD: Lipase: 31 U/L (ref 11–51)

## 2015-08-04 MED ORDER — ONDANSETRON 4 MG PO TBDP: 4.0000 mg | ORAL_TABLET | Freq: Three times a day (TID) | ORAL | Status: DC | PRN

## 2015-08-04 MED ORDER — ONDANSETRON HCL 4 MG/2ML IJ SOLN
4.0000 mg | Freq: Once | INTRAMUSCULAR | Status: AC | PRN
  Administered 2015-08-04: 4 mg via INTRAVENOUS
  Filled 2015-08-04: qty 2

## 2015-08-04 NOTE — ED Notes (Signed)
Pt tolerated fluid challenge. 

## 2015-08-04 NOTE — ED Notes (Signed)
Pt states she has had vomiting for the past 4 days  Pt states she is unable to hold anything down at this point  Pt states she feels weak and shaky all over  Pt is c/o right shoulder pain from vomiting

## 2015-08-04 NOTE — ED Provider Notes (Signed)
CSN: [id number]     Arrival date & time 08/04/15  0025 History   First MD Initiated Contact with Patient 08/04/15 0216     Chief Complaint  Patient presents with  . Emesis     (Consider location/radiation/quality/duration/timing/severity/associated sxs/prior Treatment) HPI   Patient has a PMHx of depression, hypertension, genital herpes. Ovarian cysts and SVT comes to the ER with complaints of emesis for the past 4 days. She has been unable to keep down any food or fluids. She reports feeling weak and shaky all over. She denies having any abdominal pain, back pain, diarrhea, back pains,.fevers, cough. She admits that this has happened before and does not know what has caused it. She has tried to take nauzene, Zantac, Pepto Bismol. at home. Concern she may have eaten something bad  Past Medical History  Diagnosis Date  . Depression   . Genital herpes 2000  . Infection     UTI  . Ovarian cyst   . Abnormal Pap smear   . SVT (supraventricular tachycardia) (HCC)   . Hypertension    Past Surgical History  Procedure Laterality Date  . Knee surgery    . Breast enhancement surgery    . Tonsillectomy    . Addenoidectomy    . Wisdom tooth extraction    . Breast surgery    . Knee surgery Left    Family History  Problem Relation Age of Onset  . Alcoholism    . Leukemia    . Cervical cancer    . Bone cancer    . Cirrhosis    . Diabetes Mellitus II    . Depression    . Hypertension    . Thyroid cancer     Social History  Substance Use Topics  . Smoking status: Never Smoker   . Smokeless tobacco: Never Used  . Alcohol Use: 0.0 oz/week    0 Standard drinks or equivalent per week     Comment: 4 40's beer daily- not currently   OB History    Gravida Para Term Preterm AB TAB SAB Ectopic Multiple Living   5 2 2  0 3 2 1  0 0 2     Review of Systems  Review of Systems All other systems negative except as documented in the HPI. All pertinent positives and negatives as reviewed in  the HPI.   Allergies  Penicillins  Home Medications   Prior to Admission medications   Medication Sig Start Date End Date Taking? Authorizing Provider  buprenorphine (SUBUTEX) 2 MG SUBL SL tablet Place 4 mg under the tongue daily.    Yes Historical Provider, MD  buprenorphine (SUBUTEX) 8 MG SUBL SL tablet Place 24 mg under the tongue daily.   Yes Historical Provider, MD  labetalol (NORMODYNE) 200 MG tablet Take 1 tablet (200 mg total) by mouth 3 (three) times daily. 12/19/14  Yes Brock Bad, MD  valACYclovir (VALTREX) 1000 MG tablet Take 1 tablet (1,000 mg total) by mouth as needed (with flare ups). 12/18/14  Yes Dorathy Kinsman, CNM  docusate sodium (COLACE) 100 MG capsule Take 1 capsule (100 mg total) by mouth 2 (two) times daily as needed. Patient not taking: Reported on 08/04/2015 05/11/15   Misty Stanley A Leftwich-Kirby, CNM  ibuprofen (ADVIL,MOTRIN) 600 MG tablet Take 1 tablet (600 mg total) by mouth every 6 (six) hours as needed for mild pain. Patient not taking: Reported on 08/04/2015 06/15/15   Brock Bad, MD  magnesium hydroxide (MILK OF MAGNESIA) 400 MG/5ML  suspension Take 30 mLs by mouth daily as needed for mild constipation. Patient not taking: Reported on 08/04/2015 05/11/15   Wilmer FloorLisa A Leftwich-Kirby, CNM  omeprazole (PRILOSEC) 20 MG capsule Take 1 capsule (20 mg total) by mouth 2 (two) times daily before a meal. Patient not taking: Reported on 08/04/2015 02/06/15   Brock Badharles A Harper, MD  ondansetron (ZOFRAN ODT) 4 MG disintegrating tablet Take 1 tablet (4 mg total) by mouth every 8 (eight) hours as needed for nausea or vomiting. 08/04/15   Marlon Peliffany Graig Hessling, PA-C  oxyCODONE-acetaminophen (PERCOCET/ROXICET) 5-325 MG tablet Take 1-2 tablets by mouth every 4 (four) hours as needed for moderate pain or severe pain (pain scale > 7). Patient not taking: Reported on 08/04/2015 06/15/15   Brock Badharles A Harper, MD  polyethylene glycol powder (GLYCOLAX/MIRALAX) powder Take 17 g by mouth daily. Patient  not taking: Reported on 08/04/2015 05/11/15   Wilmer FloorLisa A Leftwich-Kirby, CNM  promethazine (PHENERGAN) 25 MG tablet Take 1 tablet (25 mg total) by mouth every 6 (six) hours as needed for nausea. Patient not taking: Reported on 08/04/2015 04/11/15   Brock Badharles A Harper, MD  sodium phosphate (FLEET) 7-19 GM/118ML ENEM Place 133 mLs (1 enema total) rectally every three (3) days as needed for severe constipation. Patient not taking: Reported on 08/04/2015 05/11/15   Misty StanleyLisa A Leftwich-Kirby, CNM   BP 156/109 mmHg  Pulse 98  Temp(Src) 98.1 F (36.7 C) (Oral)  Resp 15  SpO2 98% Physical Exam  Constitutional: She appears well-developed and well-nourished. No distress.  HENT:  Head: Normocephalic and atraumatic.  Right Ear: Tympanic membrane and ear canal normal.  Left Ear: Tympanic membrane and ear canal normal.  Nose: Nose normal.  Mouth/Throat: Uvula is midline, oropharynx is clear and moist and mucous membranes are normal.  Eyes: Pupils are equal, round, and reactive to light.  Neck: Normal range of motion. Neck supple.  Cardiovascular: Normal rate and regular rhythm.   Pulmonary/Chest: Effort normal.  Abdominal: Soft.  No signs of abdominal distention  Musculoskeletal:  No LE swelling  Neurological: She is alert.  Acting at baseline  Skin: Skin is warm and dry. No rash noted.  Nursing note and vitals reviewed.   ED Course  Procedures (including critical care time) Labs Review Labs Reviewed  COMPREHENSIVE METABOLIC PANEL - Abnormal; Notable for the following:    Chloride 95 (*)    Glucose, Bld 140 (*)    Calcium 8.7 (*)    All other components within normal limits  CBC - Abnormal; Notable for the following:    RBC 5.23 (*)    RDW 16.2 (*)    All other components within normal limits  URINALYSIS, ROUTINE W REFLEX MICROSCOPIC (NOT AT [facility]) - Abnormal; Notable for the following:    APPearance CLOUDY (*)    Hgb urine dipstick TRACE (*)    Protein, ur 100 (*)    All other components within  normal limits  URINE MICROSCOPIC-ADD ON - Abnormal; Notable for the following:    Squamous Epithelial / LPF 0-5 (*)    All other components within normal limits  LIPASE, BLOOD  I-STAT BETA HCG BLOOD, ED ([facility], [facility], [facility] ONLY)    Imaging Review No results found. I have personally reviewed and evaluated these images and lab results as part of my medical decision-making.   EKG Interpretation None      MDM   Final diagnoses:  Non-intractable vomiting with nausea, vomiting of unspecified type    Medications  ondansetron (ZOFRAN) injection 4  mg (4 mg Intravenous Given 08/04/15 0229)   Patient is feeling significantly better after 1 L IV fluids and Zofran. CBC, unremarkable. UA shows no infection. Neg pregnancy test  Lipase and CMP shows no significant abnormalities.  Patient given fluid challenge and passed without difficulty. She says she is feeling significantly better and ready for discharge.  Medications  ondansetron (ZOFRAN) injection 4 mg (4 mg Intravenous Given 08/04/15 0229)    I discussed results, diagnoses and plan with Pablo Lawrence Ramser. They voice there understanding and questions were answered. We discussed follow-up recommendations and return precautions.    Marlon Pel, PA-C 08/04/15 0427  Dione Booze, MD 08/04/15 [phone]

## 2015-08-04 NOTE — Discharge Instructions (Signed)

## 2015-08-12 ENCOUNTER — Emergency Department (HOSPITAL_COMMUNITY): Payer: Medicaid Other

## 2015-08-12 ENCOUNTER — Inpatient Hospital Stay (HOSPITAL_COMMUNITY)
Admission: EM | Admit: 2015-08-12 | Discharge: 2015-08-15 | DRG: 070 | Disposition: A | Discharge status: Home / Self Care | Payer: Medicaid Other | Attending: Pulmonary Disease | Admitting: Pulmonary Disease

## 2015-08-12 ENCOUNTER — Encounter (HOSPITAL_COMMUNITY): Payer: Self-pay | Admitting: Emergency Medicine

## 2015-08-12 DIAGNOSIS — N939 Abnormal uterine and vaginal bleeding, unspecified: Secondary | ICD-10-CM

## 2015-08-12 DIAGNOSIS — E876 Hypokalemia: Secondary | ICD-10-CM | POA: Diagnosis present

## 2015-08-12 DIAGNOSIS — J9601 Acute respiratory failure with hypoxia: Secondary | ICD-10-CM | POA: Diagnosis present

## 2015-08-12 DIAGNOSIS — F10120 Alcohol abuse with intoxication, uncomplicated: Secondary | ICD-10-CM

## 2015-08-12 DIAGNOSIS — I10 Essential (primary) hypertension: Secondary | ICD-10-CM | POA: Diagnosis present

## 2015-08-12 DIAGNOSIS — G894 Chronic pain syndrome: Secondary | ICD-10-CM | POA: Diagnosis present

## 2015-08-12 DIAGNOSIS — F10239 Alcohol dependence with withdrawal, unspecified: Secondary | ICD-10-CM | POA: Diagnosis present

## 2015-08-12 DIAGNOSIS — T426X2A Poisoning by other antiepileptic and sedative-hypnotic drugs, intentional self-harm, initial encounter: Secondary | ICD-10-CM | POA: Diagnosis present

## 2015-08-12 DIAGNOSIS — F329 Major depressive disorder, single episode, unspecified: Secondary | ICD-10-CM | POA: Diagnosis present

## 2015-08-12 DIAGNOSIS — R401 Stupor: Secondary | ICD-10-CM | POA: Diagnosis not present

## 2015-08-12 DIAGNOSIS — Z79899 Other long term (current) drug therapy: Secondary | ICD-10-CM

## 2015-08-12 DIAGNOSIS — Z880 Allergy status to penicillin: Secondary | ICD-10-CM

## 2015-08-12 DIAGNOSIS — F53 Puerperal psychosis: Secondary | ICD-10-CM | POA: Diagnosis present

## 2015-08-12 DIAGNOSIS — I2510 Atherosclerotic heart disease of native coronary artery without angina pectoris: Secondary | ICD-10-CM | POA: Diagnosis not present

## 2015-08-12 DIAGNOSIS — R4182 Altered mental status, unspecified: Secondary | ICD-10-CM

## 2015-08-12 DIAGNOSIS — O209 Hemorrhage in early pregnancy, unspecified: Secondary | ICD-10-CM

## 2015-08-12 DIAGNOSIS — R109 Unspecified abdominal pain: Secondary | ICD-10-CM

## 2015-08-12 DIAGNOSIS — F1920 Other psychoactive substance dependence, uncomplicated: Secondary | ICD-10-CM | POA: Diagnosis present

## 2015-08-12 DIAGNOSIS — G9340 Encephalopathy, unspecified: Principal | ICD-10-CM | POA: Diagnosis present

## 2015-08-12 DIAGNOSIS — Z781 Physical restraint status: Secondary | ICD-10-CM | POA: Diagnosis not present

## 2015-08-12 DIAGNOSIS — F10121 Alcohol abuse with intoxication delirium: Secondary | ICD-10-CM | POA: Diagnosis not present

## 2015-08-12 DIAGNOSIS — O26899 Other specified pregnancy related conditions, unspecified trimester: Secondary | ICD-10-CM

## 2015-08-12 DIAGNOSIS — R110 Nausea: Secondary | ICD-10-CM

## 2015-08-12 DIAGNOSIS — R451 Restlessness and agitation: Secondary | ICD-10-CM | POA: Diagnosis present

## 2015-08-12 LAB — COMPREHENSIVE METABOLIC PANEL
ALBUMIN: 3.8 g/dL (ref 3.5–5.0)
ALT: 46 U/L (ref 14–54)
ANION GAP: 12 (ref 5–15)
AST: 40 U/L (ref 15–41)
Alkaline Phosphatase: 84 U/L (ref 38–126)
BUN: 12 mg/dL (ref 6–20)
CO2: 25 mmol/L (ref 22–32)
Calcium: 8.4 mg/dL — ABNORMAL LOW (ref 8.9–10.3)
Chloride: 101 mmol/L (ref 101–111)
Creatinine, Ser: 0.62 mg/dL (ref 0.44–1.00)
GFR calc Af Amer: >60 mL/min (ref >60)
GFR calc non Af Amer: >60 mL/min (ref >60)
GLUCOSE: 166 mg/dL — AB (ref 65–99)
POTASSIUM: 3.9 mmol/L (ref 3.5–5.1)
SODIUM: 138 mmol/L (ref 135–145)
Total Bilirubin: 0.5 mg/dL (ref 0.3–1.2)
Total Protein: 7 g/dL (ref 6.5–8.1)

## 2015-08-12 LAB — RAPID URINE DRUG SCREEN, HOSP PERFORMED
AMPHETAMINES: NOT DETECTED
BARBITURATES: NOT DETECTED
Benzodiazepines: NOT DETECTED
Cocaine: NOT DETECTED
OPIATES: NOT DETECTED
TETRAHYDROCANNABINOL: NOT DETECTED

## 2015-08-12 LAB — BLOOD GAS, ARTERIAL
Acid-base deficit: 0.3 mmol/L (ref 0.0–2.0)
Bicarbonate: 21.9 mEq/L (ref 20.0–24.0)
Drawn by: 235321
FIO2: 1
MECHVT: 600 mL
O2 Saturation: 97.7 %
PEEP: 5 cmH2O
Patient temperature: 98.6
RATE: 18 resp/min
TCO2: 19 mmol/L (ref 0–100)
pCO2 arterial: 29.9 mmHg — ABNORMAL LOW (ref 35.0–45.0)
pH, Arterial: 7.478 — ABNORMAL HIGH (ref 7.350–7.450)
pO2, Arterial: 108 mmHg — ABNORMAL HIGH (ref 80.0–100.0)

## 2015-08-12 LAB — CBC
HEMATOCRIT: 40.2 % (ref 36.0–46.0)
Hemoglobin: 13.7 g/dL (ref 12.0–15.0)
MCH: 28.1 pg (ref 26.0–34.0)
MCHC: 34.1 g/dL (ref 30.0–36.0)
MCV: 82.5 fL (ref 78.0–100.0)
Platelets: 219 10*3/uL (ref 150–400)
RBC: 4.87 MIL/uL (ref 3.87–5.11)
RDW: 18.7 % — AB (ref 11.5–15.5)
WBC: 3.8 10*3/uL — AB (ref 4.0–10.5)

## 2015-08-12 LAB — SALICYLATE LEVEL: Salicylate Lvl: <4 mg/dL (ref 2.8–30.0)

## 2015-08-12 LAB — PROTIME-INR
INR: 1.11 (ref 0.00–1.49)
Prothrombin Time: 14.1 seconds (ref 11.6–15.2)

## 2015-08-12 LAB — PHOSPHORUS: PHOSPHORUS: 2.9 mg/dL (ref 2.5–4.6)

## 2015-08-12 LAB — ACETAMINOPHEN LEVEL

## 2015-08-12 LAB — CBG MONITORING, ED
GLUCOSE-CAPILLARY: 134 mg/dL — AB (ref 65–99)
Glucose-Capillary: 186 mg/dL — ABNORMAL HIGH (ref 65–99)

## 2015-08-12 LAB — ETHANOL: Alcohol, Ethyl (B): 140 mg/dL — ABNORMAL HIGH (ref <5)

## 2015-08-12 LAB — AMYLASE: Amylase: 36 U/L (ref 28–100)

## 2015-08-12 LAB — LACTIC ACID, PLASMA: Lactic Acid, Venous: 2.7 mmol/L (ref 0.5–2.0)

## 2015-08-12 LAB — MAGNESIUM: MAGNESIUM: 1.8 mg/dL (ref 1.7–2.4)

## 2015-08-12 LAB — TROPONIN I

## 2015-08-12 LAB — PROCALCITONIN: Procalcitonin: <0.1 ng/mL

## 2015-08-12 LAB — LIPASE, BLOOD: Lipase: 57 U/L — ABNORMAL HIGH (ref 11–51)

## 2015-08-12 MED ORDER — PROPOFOL 1000 MG/100ML IV EMUL
5.0000 ug/kg/min | Freq: Once | INTRAVENOUS | Status: AC
  Administered 2015-08-12: 5 ug/kg/min via INTRAVENOUS

## 2015-08-12 MED ORDER — HEPARIN SODIUM (PORCINE) 5000 UNIT/ML IJ SOLN
5000.0000 [IU] | Freq: Three times a day (TID) | INTRAMUSCULAR | Status: DC
  Administered 2015-08-12 – 2015-08-15 (×7): 5000 [IU] via SUBCUTANEOUS
  Filled 2015-08-12 (×10): qty 1

## 2015-08-12 MED ORDER — PROPOFOL 1000 MG/100ML IV EMUL
INTRAVENOUS | Status: AC
  Filled 2015-08-12: qty 100

## 2015-08-12 MED ORDER — LABETALOL HCL 5 MG/ML IV SOLN
10.0000 mg | INTRAVENOUS | Status: DC | PRN
  Administered 2015-08-12 – 2015-08-13 (×3): 10 mg via INTRAVENOUS
  Filled 2015-08-12 (×5): qty 4

## 2015-08-12 MED ORDER — IPRATROPIUM-ALBUTEROL 0.5-2.5 (3) MG/3ML IN SOLN
3.0000 mL | Freq: Four times a day (QID) | RESPIRATORY_TRACT | Status: DC
  Administered 2015-08-12 – 2015-08-13 (×2): 3 mL via RESPIRATORY_TRACT
  Filled 2015-08-12 (×2): qty 3

## 2015-08-12 MED ORDER — PANTOPRAZOLE SODIUM 40 MG IV SOLR
40.0000 mg | Freq: Every day | INTRAVENOUS | Status: DC
  Administered 2015-08-13 (×2): 40 mg via INTRAVENOUS
  Filled 2015-08-12 (×2): qty 40

## 2015-08-12 MED ORDER — SODIUM CHLORIDE 0.9 % IV SOLN
250.0000 mL | INTRAVENOUS | Status: DC | PRN
  Administered 2015-08-13: 250 mL via INTRAVENOUS

## 2015-08-12 MED ORDER — LORAZEPAM 2 MG/ML IJ SOLN
INTRAMUSCULAR | Status: AC
  Administered 2015-08-12: 4 mg
  Filled 2015-08-12: qty 2

## 2015-08-12 MED ORDER — FENTANYL CITRATE (PF) 100 MCG/2ML IJ SOLN: 100.0000 ug | INTRAMUSCULAR | Status: DC | PRN

## 2015-08-12 MED ORDER — FENTANYL CITRATE (PF) 100 MCG/2ML IJ SOLN
100.0000 ug | INTRAMUSCULAR | Status: DC | PRN
  Administered 2015-08-13 (×2): 100 ug via INTRAVENOUS
  Filled 2015-08-12 (×3): qty 2

## 2015-08-12 MED ORDER — SUCCINYLCHOLINE CHLORIDE 20 MG/ML IJ SOLN
100.0000 mg | Freq: Once | INTRAMUSCULAR | Status: AC
  Administered 2015-08-12: 100 mg via INTRAVENOUS
  Filled 2015-08-12: qty 5

## 2015-08-12 MED ORDER — DEXTROSE-NACL 5-0.45 % IV SOLN
INTRAVENOUS | Status: DC
  Administered 2015-08-12: via INTRAVENOUS

## 2015-08-12 MED ORDER — NALOXONE HCL 2 MG/2ML IJ SOSY
PREFILLED_SYRINGE | INTRAMUSCULAR | Status: AC
  Administered 2015-08-12: 2 mg
  Filled 2015-08-12: qty 2

## 2015-08-12 MED ORDER — ETOMIDATE 2 MG/ML IV SOLN
20.0000 mg | Freq: Once | INTRAVENOUS | Status: AC
  Administered 2015-08-12: 20 mg via INTRAVENOUS

## 2015-08-12 MED ORDER — PROPOFOL 1000 MG/100ML IV EMUL
0.0000 ug/kg/min | INTRAVENOUS | Status: DC
Start: 2015-08-12 — End: 2015-08-13
  Administered 2015-08-12: 30 ug/kg/min via INTRAVENOUS
  Administered 2015-08-13: 50 ug/kg/min via INTRAVENOUS
  Administered 2015-08-13: 40 ug/kg/min via INTRAVENOUS
  Administered 2015-08-13: 50 ug/kg/min via INTRAVENOUS
  Filled 2015-08-12 (×3): qty 100

## 2015-08-12 NOTE — ED Notes (Addendum)
Pt was found by brother naked in her bathroom and intoxicated on ETOH. Pt is responsive to voice and painful stimuli. Pt has a history of alcoholism. Pt was found with an empty bottle of phenergan

## 2015-08-12 NOTE — ED Provider Notes (Signed)
CSN: [id number]     Arrival date & time 08/12/15  1742 History   First MD Initiated Contact with Patient 08/12/15 1759     Chief Complaint  Patient presents with  . Alcohol Intoxication   HPI    5 caveat due to altered mental status  40 year old female presents with altered mental status. Patient's brother and significant other at bedside. Significant other notes that he was over at her house earlier today, she was significantly intoxicated, was responding normal. Brother notes that he came by approximately 45 minutes later and patient seemed even more intoxicated, was sitting naked on the toilet. She was responding to his questioning, with slurred speech. EMS was called, she was very aggressive towards EMS when they tried to move her.  No known trauma or signs of injury.   They note a significant past medical history of depression, alcohol abuse, opioid abuse, currently taking naloxone.   Past Medical History  Diagnosis Date  . Depression   . Genital herpes 2000  . Infection     UTI  . Ovarian cyst   . Abnormal Pap smear   . SVT (supraventricular tachycardia) (HCC)   . Hypertension    Past Surgical History  Procedure Laterality Date  . Knee surgery    . Breast enhancement surgery    . Tonsillectomy    . Addenoidectomy    . Wisdom tooth extraction    . Breast surgery    . Knee surgery Left    Family History  Problem Relation Age of Onset  . Alcoholism    . Leukemia    . Cervical cancer    . Bone cancer    . Cirrhosis    . Diabetes Mellitus II    . Depression    . Hypertension    . Thyroid cancer     Social History  Substance Use Topics  . Smoking status: Never Smoker   . Smokeless tobacco: Never Used  . Alcohol Use: 0.0 oz/week    0 Standard drinks or equivalent per week     Comment: 4 40's beer daily- not currently   OB History    Gravida Para Term Preterm AB TAB SAB Ectopic Multiple Living   5 2 2  0 3 2 1  0 0 2     Review of Systems  Cannot be  performed  Allergies  Penicillins  Home Medications   Prior to Admission medications   Medication Sig Start Date End Date Taking? Authorizing Provider  buprenorphine (SUBUTEX) 2 MG SUBL SL tablet Place 4 mg under the tongue daily.    Yes Historical Provider, MD  buprenorphine (SUBUTEX) 8 MG SUBL SL tablet Place 24 mg under the tongue daily.   Yes Historical Provider, MD  labetalol (NORMODYNE) 200 MG tablet Take 1 tablet (200 mg total) by mouth 3 (three) times daily. 12/19/14  Yes Brock Bad, MD  promethazine (PHENERGAN) 25 MG tablet Take 1 tablet (25 mg total) by mouth every 6 (six) hours as needed for nausea. 04/11/15  Yes Brock Bad, MD  valACYclovir (VALTREX) 1000 MG tablet Take 1 tablet (1,000 mg total) by mouth as needed (with flare ups). 12/18/14  Yes Dorathy Kinsman, CNM  docusate sodium (COLACE) 100 MG capsule Take 1 capsule (100 mg total) by mouth 2 (two) times daily as needed. Patient not taking: Reported on 08/04/2015 05/11/15   Misty Stanley A Leftwich-Kirby, CNM  ibuprofen (ADVIL,MOTRIN) 600 MG tablet Take 1 tablet (600 mg total) by mouth every 6 (  six) hours as needed for mild pain. Patient not taking: Reported on 08/04/2015 06/15/15   Brock Bad, MD  magnesium hydroxide (MILK OF MAGNESIA) 400 MG/5ML suspension Take 30 mLs by mouth daily as needed for mild constipation. Patient not taking: Reported on 08/04/2015 05/11/15   Wilmer Floor Leftwich-Kirby, CNM  omeprazole (PRILOSEC) 20 MG capsule Take 1 capsule (20 mg total) by mouth 2 (two) times daily before a meal. Patient not taking: Reported on 08/04/2015 02/06/15   Brock Bad, MD  ondansetron (ZOFRAN ODT) 4 MG disintegrating tablet Take 1 tablet (4 mg total) by mouth every 8 (eight) hours as needed for nausea or vomiting. Patient not taking: Reported on 08/12/2015 08/04/15   Marlon Pel, PA-C  oxyCODONE-acetaminophen (PERCOCET/ROXICET) 5-325 MG tablet Take 1-2 tablets by mouth every 4 (four) hours as needed for moderate pain or  severe pain (pain scale > 7). Patient not taking: Reported on 08/04/2015 06/15/15   Brock Bad, MD  polyethylene glycol powder (GLYCOLAX/MIRALAX) powder Take 17 g by mouth daily. Patient not taking: Reported on 08/04/2015 05/11/15   Misty Stanley A Leftwich-Kirby, CNM  sodium phosphate (FLEET) 7-19 GM/118ML ENEM Place 133 mLs (1 enema total) rectally every three (3) days as needed for severe constipation. Patient not taking: Reported on 08/04/2015 05/11/15   Misty Stanley A Leftwich-Kirby, CNM   BP 175/125 mmHg  Pulse 108  Temp(Src) 98.1 F (36.7 C) (Axillary)  Resp 15  Ht 5\' 9"  (1.753 m)  Wt 95.255 kg  BMI 31.00 kg/m2  SpO2 99%   Physical Exam  HENT:  Head: Normocephalic.  Mouth/Throat: Oropharynx is clear and moist. No oropharyngeal exudate.  Head atraumatic  Eyes: Pupils are equal, round, and reactive to light. Right eye exhibits no discharge. Left eye exhibits no discharge. No scleral icterus.  Cardiovascular: Regular rhythm and normal heart sounds.  Exam reveals no gallop and no friction rub.   No murmur heard. Pulmonary/Chest: No respiratory distress. She has no wheezes. She has no rales. She exhibits no tenderness.  Abdominal: Soft. She exhibits no distension and no mass. There is no rebound and no guarding.  Musculoskeletal: Normal range of motion. She exhibits no edema.  Neurological:  Patient is responsive to painful stimuli. Pupils equal round and reactive to light  Skin: Skin is warm and dry. No rash noted. No erythema. No pallor.    ED Course  Procedures (including critical care time)   CRITICAL CARE Performed by: Thermon Leyland   Total critical care time: 40 minutes  Critical care time was exclusive of separately billable procedures and treating other patients.  Critical care was necessary to treat or prevent imminent or life-threatening deterioration.  Critical care was time spent personally by me on the following activities: development of treatment plan with patient  and/or surrogate as well as nursing, discussions with consultants, evaluation of patient's response to treatment, examination of patient, obtaining history from patient or surrogate, ordering and performing treatments and interventions, ordering and review of laboratory studies, ordering and review of radiographic studies, pulse oximetry and re-evaluation of patient's condition.   INTUBATION Performed by: Thermon Leyland  Required items: required blood products, implants, devices, and special equipment available Patient identity confirmed: provided demographic data and hospital-assigned identification number Time out: Immediately prior to procedure a "time out" was called to verify the correct patient, procedure, equipment, support staff and site/side marked as required.  Indications: AMS unable to maintain airway  Intubation method: Glidescope Laryngoscopy   Preoxygenation: 100   Sedatives: 20Etomidate Paralytic:  100 Succinylcholine  Tube Size: 7.0 cuffed  Post-procedure assessment: chest rise and ETCO2 monitor Breath sounds: equal and absent over the epigastrium Tube secured with: ETT holder Chest x-ray interpreted by radiologist and me.  Chest x-ray findings: endotracheal tube Slightly above the carina, this was adjusted to 22 at the teeth, repeat chest portable shows good position  Patient tolerated the procedure well with no immediate complications.   Labs Review Labs Reviewed  COMPREHENSIVE METABOLIC PANEL - Abnormal; Notable for the following:    Glucose, Bld 166 (*)    Calcium 8.4 (*)    All other components within normal limits  ETHANOL - Abnormal; Notable for the following:    Alcohol, Ethyl (B) 140 (*)    All other components within normal limits  ACETAMINOPHEN LEVEL - Abnormal; Notable for the following:    Acetaminophen (Tylenol), Serum <10 (*)    All other components within normal limits  CBC - Abnormal; Notable for the following:    WBC 3.8 (*)    RDW  18.7 (*)    All other components within normal limits  BLOOD GAS, ARTERIAL - Abnormal; Notable for the following:    pH, Arterial 7.478 (*)    pCO2 arterial 29.9 (*)    pO2, Arterial 108 (*)    All other components within normal limits  LACTIC ACID, PLASMA - Abnormal; Notable for the following:    Lactic Acid, Venous 2.7 (*)    All other components within normal limits  CBG MONITORING, ED - Abnormal; Notable for the following:    Glucose-Capillary 186 (*)    All other components within normal limits  CBG MONITORING, ED - Abnormal; Notable for the following:    Glucose-Capillary 134 (*)    All other components within normal limits  MRSA PCR SCREENING  SALICYLATE LEVEL  URINE RAPID DRUG SCREEN, HOSP PERFORMED  PROTIME-INR  MAGNESIUM  PHOSPHORUS  AMYLASE  LIPASE, BLOOD  TROPONIN I  TROPONIN I  TROPONIN I  PROCALCITONIN  BLOOD GAS, ARTERIAL  CBC  BASIC METABOLIC PANEL  MAGNESIUM  PHOSPHORUS  TRIGLYCERIDES    Imaging Review Ct Head Wo Contrast  08/12/2015  CLINICAL DATA:  Found naked and incapacitated. May have overdosed on Phenergan. Initial encounter. EXAM: CT HEAD WITHOUT CONTRAST TECHNIQUE: Contiguous axial images were obtained from the base of the skull through the vertex without intravenous contrast. COMPARISON:  CT of the head performed 09/21/2010 FINDINGS: There is no evidence of acute infarction, mass lesion, or intra- or extra-axial hemorrhage on CT. The posterior fossa, including the cerebellum, brainstem and fourth ventricle, is within normal limits. The third and lateral ventricles, and basal ganglia are unremarkable in appearance. The cerebral hemispheres are symmetric in appearance, with normal gray-white differentiation. No mass effect or midline shift is seen. There is no evidence of fracture; visualized osseous structures are unremarkable in appearance. The orbits are within normal limits. The paranasal sinuses and mastoid air cells are well-aerated. No significant  soft tissue abnormalities are seen. IMPRESSION: Unremarkable noncontrast CT of the head. Electronically Signed   By: Roanna RaiderJeffery  Chang M.D.   On: 08/12/2015 19:37   Ct Cervical Spine Wo Contrast  08/12/2015  CLINICAL DATA:  Found naked and incapacitated. May have overdosed on Phenergan. Initial encounter. EXAM: CT HEAD WITHOUT CONTRAST TECHNIQUE: Contiguous axial images were obtained from the base of the skull through the vertex without intravenous contrast. COMPARISON:  CT of the head performed 09/21/2010 FINDINGS: There is no evidence of acute infarction, mass lesion, or intra-  or extra-axial hemorrhage on CT. The posterior fossa, including the cerebellum, brainstem and fourth ventricle, is within normal limits. The third and lateral ventricles, and basal ganglia are unremarkable in appearance. The cerebral hemispheres are symmetric in appearance, with normal gray-white differentiation. No mass effect or midline shift is seen. There is no evidence of fracture; visualized osseous structures are unremarkable in appearance. The orbits are within normal limits. The paranasal sinuses and mastoid air cells are well-aerated. No significant soft tissue abnormalities are seen. IMPRESSION: Unremarkable noncontrast CT of the head. Electronically Signed   By: Roanna Raider M.D.   On: 08/12/2015 19:37   Dg Chest Portable 1 View  08/12/2015  CLINICAL DATA:  Status post intubation. EXAM: PORTABLE CHEST 1 VIEW COMPARISON:  Aug 12, 2015 FINDINGS: The ETT is is in good position, below the thoracic inlet and above the carina. No pneumothorax. The heart, hila, and mediastinum are unchanged. No pulmonary nodules or masses. Possible mild pulmonary venous congestion. IMPRESSION: The ETT is in good position.  Mild pulmonary venous congestion. Electronically Signed   By: Gerome Sam III M.D   On: 08/12/2015 21:33   Dg Chest Portable 1 View  08/12/2015  CLINICAL DATA:  Found down, intoxicated.  Status post intubation. EXAM:  PORTABLE CHEST 1 VIEW COMPARISON:  Chest x-ray dated 10/09/2012. FINDINGS: Endotracheal tube well positioned with tip just above the level of the carina. Study is hypoinspiratory with crowding of the perihilar bronchovascular markings. Given the low lung volumes, lungs appear clear. No evidence of consolidation or pleural effusion. Osseous structures about the chest are unremarkable. IMPRESSION: Endotracheal tube well positioned with tip just above the level of the carina. Low lung volumes. Electronically Signed   By: Bary Richard M.D.   On: 08/12/2015 20:20   I have personally reviewed and evaluated these images and lab results as part of my medical decision-making.   EKG Interpretation   Date/Time:  Saturday Aug 12 2015 17:53:46 EDT Ventricular Rate:  98 PR Interval:  127 QRS Duration: 101 QT Interval:  361 QTC Calculation: 461 R Axis:   64 Text Interpretation:  Sinus rhythm Anteroseptal infarct, old Rate  decreased from previous tracing (July 2014) Confirmed by Tyrone Apple  [phone]) on 08/12/2015 5:56:00 PM      MDM   Final diagnoses:  Altered mental status  Vaginal bleeding  Abdominal pain in pregnancy    Labs:PT/INR, cheerful blood baths, rapid urine drug screen, CBG, CMP, ethanol- ethanol 140 no other acute abnormalities noted  Imaging: CT head without contrast, CT cervical spine- no significant findings  Consults: Intensive care  Therapeutics: Narcan 4mg , Ativan, propofol, etomidate, succinyl choline  Discharge Meds:   Assessment/Plan: 40 year old female presents today with altered mental status. Initially patient's presentation was consistent with alcohol intoxication. She was found at home with alcohol around her, appear to be intoxicated and responding. Patient's status slowly declined wall here in the hospital. Initially she was responsive to pain, in no distress. Upon repeat evaluation patient's status continued to decline, her labs came back showing elevated  ethanol, but not consistent with her presentation. Patient was given 2 mg of Narcan with no response, she was given 2 more milligrams with no response.  CT scan showed no significant intracranial abnormality. Due to patient's declining status, intubation performed here in the ED without complication.    Critical care was consult and would see patient here in the ED.  Patient was noted to be hypertensive while here in the ED, prior to intubation  her blood pressure was in the low 200s systolic, after she was started on propofol this began to decline. We'll wait to see results of propofol before giving additional blood pressure medication  Patient's ET tube noted to be close to the carina, it was noted to be 24 at the teeth, I moved this back to 22 at the teeth with repeat portable  At this time I'm uncertain what causing patient's symptoms. Family notes that it is quite possible that she had ingested unknown substance, she has a history of cutting, suicidal ideations, and significant alcohol and opioid abuse. She is currently taking Suboxone, they're unaware of any other medications or drugs that she may be using at this time. At the time of patient care transfer patient's laboratory analysis, diagnostic imaging, physical exam unrevealing. Patient continues to have round and reactive pupils, blood pressure remains below 200 systolic, she is afebrile, been maintaining adequate oxygen saturation.  Pt care was shared with Nilda Riggs MD who personally evaluated the patient and was present during intubation.                 Eyvonne Mechanic, PA-C 08/12/15 2204  Lavera Guise, MD 08/14/15 [phone]

## 2015-08-12 NOTE — ED Notes (Signed)
Intubation procedures started.  Dr. Joni FearsLui at bedside, [place], [place] at head of bed to insert tube.  Raiford Nobleick, RN and [place], RN present, IV's patent.

## 2015-08-12 NOTE — ED Notes (Signed)
Notified PA of lactic result.

## 2015-08-12 NOTE — ED Notes (Signed)
Report called to 2W

## 2015-08-12 NOTE — ED Notes (Signed)
Bed: [facility] Expected date: 08/12/15 Expected time: 5:36 PM Means of arrival: Ambulance Comments: ETOH

## 2015-08-12 NOTE — ED Notes (Signed)
Patient biting on tube and trying to push tube out with tongue.

## 2015-08-12 NOTE — H&P (Signed)
PULMONARY / CRITICAL CARE MEDICINE   Name: Megan Edwards MRN: [id number] DOB: February 08, 1976    ADMISSION DATE:  08/12/2015 CONSULTATION DATE:  08/12/2015  REFERRING MD:  ED  CHIEF COMPLAINT:  Unresponsive. ETOH intoxication.   HISTORY OF PRESENT ILLNESS:   Pt is encephalopathic so no history was obtained from her. Chart review was done as well as speaking with pt's boyfriend and brother.  Pt with ETOH abuse, recently started drinking a lot the last 2-3 weeks 2/2 stress.  She had a baby 2 mos ago and was HTNsive during that time and has remained HTNsive with BP 160-170 systolic. Pt has chronic pain issues and she takes Suboxone.   Today, she was seen unresponsive the entire day, getting more somnolent so 911 eventually called. At the ED, BP was 200 systolic.  Intubated for airway protection.  Propofol just started and BP is now 170/100. BAL was 140, UDS was (-).   PCCM consulted to admit pt.   PAST MEDICAL HISTORY :  She  has a past medical history of Depression; Genital herpes (2000); Infection; Ovarian cyst; Abnormal Pap smear; SVT (supraventricular tachycardia) (HCC); and Hypertension.  Chronic Pain > on SUBOXONE   PAST SURGICAL HISTORY: She  has past surgical history that includes Knee surgery; Breast enhancement surgery; Tonsillectomy; addenoidectomy; Wisdom tooth extraction; Breast surgery; and Knee surgery (Left).  Allergies  Allergen Reactions  . Penicillins Rash    Has patient had a PCN reaction causing immediate rash, facial/tongue/throat swelling, SOB or lightheadedness with hypotension: Yes Has patient had a PCN reaction causing severe rash involving mucus membranes or skin necrosis: No Has patient had a PCN reaction that required hospitalization No Has patient had a PCN reaction occurring within the last 10 years: No If all of the above answers are "NO", then may proceed with Cephalosporin use.    No current facility-administered medications on file prior to  encounter.   Current Outpatient Prescriptions on File Prior to Encounter  Medication Sig  . buprenorphine (SUBUTEX) 2 MG SUBL SL tablet Place 4 mg under the tongue daily.   . buprenorphine (SUBUTEX) 8 MG SUBL SL tablet Place 24 mg under the tongue daily.  Marland Kitchen labetalol (NORMODYNE) 200 MG tablet Take 1 tablet (200 mg total) by mouth 3 (three) times daily.  . promethazine (PHENERGAN) 25 MG tablet Take 1 tablet (25 mg total) by mouth every 6 (six) hours as needed for nausea.  . valACYclovir (VALTREX) 1000 MG tablet Take 1 tablet (1,000 mg total) by mouth as needed (with flare ups).  . docusate sodium (COLACE) 100 MG capsule Take 1 capsule (100 mg total) by mouth 2 (two) times daily as needed. (Patient not taking: Reported on 08/04/2015)  . ibuprofen (ADVIL,MOTRIN) 600 MG tablet Take 1 tablet (600 mg total) by mouth every 6 (six) hours as needed for mild pain. (Patient not taking: Reported on 08/04/2015)  . magnesium hydroxide (MILK OF MAGNESIA) 400 MG/5ML suspension Take 30 mLs by mouth daily as needed for mild constipation. (Patient not taking: Reported on 08/04/2015)  . omeprazole (PRILOSEC) 20 MG capsule Take 1 capsule (20 mg total) by mouth 2 (two) times daily before a meal. (Patient not taking: Reported on 08/04/2015)  . ondansetron (ZOFRAN ODT) 4 MG disintegrating tablet Take 1 tablet (4 mg total) by mouth every 8 (eight) hours as needed for nausea or vomiting. (Patient not taking: Reported on 08/12/2015)  . oxyCODONE-acetaminophen (PERCOCET/ROXICET) 5-325 MG tablet Take 1-2 tablets by mouth every 4 (four) hours as needed  for moderate pain or severe pain (pain scale > 7). (Patient not taking: Reported on 08/04/2015)  . polyethylene glycol powder (GLYCOLAX/MIRALAX) powder Take 17 g by mouth daily. (Patient not taking: Reported on 08/04/2015)  . sodium phosphate (FLEET) 7-19 GM/118ML ENEM Place 133 mLs (1 enema total) rectally every three (3) days as needed for severe constipation. (Patient not taking:  Reported on 08/04/2015)    FAMILY HISTORY:  Her has no family status information on file.   Father is deceased. Mother has MS. Brother has sze d/o.   SOCIAL HISTORY: She  reports that she has never smoked. She has never used smokeless tobacco. She reports that she drinks alcohol. She reports that she does not use illicit drugs. Single, with 2 children. 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 Has a boyfriend (father of 2nd child) who has been with her the last 10days.   REVIEW OF SYSTEMS:   Per brother and boyfriend, has been drrinking and with on and off consusion the last 1-2 days/ (-) fevers, chills, SOB, cp, cough, abd pain, edema. 14 point ROS was done and everything else was (-) other than the ones mentioned.    SUBJECTIVE:  Intubated. Sedated. Comfortable.   VITAL SIGNS: BP 192/126 mmHg  Pulse 111  Temp(Src) 97.7 F (36.5 C) (Axillary)  Resp 20  Ht 5\' 9"  (1.753 m)  Wt 95.255 kg (210 lb)  BMI 31.00 kg/m2  SpO2 98%  HEMODYNAMICS:    VENTILATOR SETTINGS: Vent Mode:  [-] PRVC FiO2 (%):  [60 %-100 %] 60 % Set Rate:  [14 bmp-18 bmp] 14 bmp Vt Set:  [600 mL] 600 mL PEEP:  [5 cmH20] 5 cmH20 Plateau Pressure:  [19 cmH20] 19 cmH20  INTAKE / OUTPUT:    PHYSICAL EXAMINATION: General:  Sedated, intubated, comfortable.  Neuro:  Sedated. CN grossly intact. (-) lateralizing signs seen.  HEENT:  PERLA, (-) NVD, ETT in place.  Cardiovascular:  Good s1/s2. (-) s3/m/r/g Lungs:  Good ae. Dec BS BLF. cta Abdomen:  (+) BS, soft, NT, (-) masses/tenderness Musculoskeletal:  (-) gross abn.  Skin:  With previous suicide marks in B forearms. (-) edema/rash/clubbing/cyanosis.   LABS:  BMET  Recent Labs Lab 08/12/15 1749  NA 138  K 3.9  CL 101  CO2 25  BUN 12  CREATININE 0.62  GLUCOSE 166*    Electrolytes  Recent Labs Lab 08/12/15 1749  CALCIUM 8.4*    CBC No results for input(s): WBC, HGB, HCT, PLT in the last 168 hours.  Coag's  Recent Labs Lab 08/12/15 2000  INR 1.11     Sepsis Markers No results for input(s): LATICACIDVEN, PROCALCITON, O2SATVEN in the last 168 hours.  ABG  Recent Labs Lab 08/12/15 1957  PHART 7.478*  PCO2ART 29.9*  PO2ART 108*    Liver Enzymes  Recent Labs Lab 08/12/15 1749  AST 40  ALT 46  ALKPHOS 84  BILITOT 0.5  ALBUMIN 3.8    Cardiac Enzymes No results for input(s): TROPONINI, PROBNP in the last 168 hours.  Glucose  Recent Labs Lab 08/12/15 1758  GLUCAP 186*    Imaging Ct Head Wo Contrast  08/12/2015  CLINICAL DATA:  Found naked and incapacitated. May have overdosed on Phenergan. Initial encounter. EXAM: CT HEAD WITHOUT CONTRAST TECHNIQUE: Contiguous axial images were obtained from the base of the skull through the vertex without intravenous contrast. COMPARISON:  CT of the head performed 09/21/2010 FINDINGS: There is no evidence of acute infarction, mass lesion, or intra- or extra-axial hemorrhage on CT. The posterior  fossa, including the cerebellum, brainstem and fourth ventricle, is within normal limits. The third and lateral ventricles, and basal ganglia are unremarkable in appearance. The cerebral hemispheres are symmetric in appearance, with normal gray-white differentiation. No mass effect or midline shift is seen. There is no evidence of fracture; visualized osseous structures are unremarkable in appearance. The orbits are within normal limits. The paranasal sinuses and mastoid air cells are well-aerated. No significant soft tissue abnormalities are seen. IMPRESSION: Unremarkable noncontrast CT of the head. Electronically Signed   By: Roanna RaiderJeffery  Chang M.D.   On: 08/12/2015 19:37   Ct Cervical Spine Wo Contrast  08/12/2015  CLINICAL DATA:  Found naked and incapacitated. May have overdosed on Phenergan. Initial encounter. EXAM: CT HEAD WITHOUT CONTRAST TECHNIQUE: Contiguous axial images were obtained from the base of the skull through the vertex without intravenous contrast. COMPARISON:  CT of the head  performed 09/21/2010 FINDINGS: There is no evidence of acute infarction, mass lesion, or intra- or extra-axial hemorrhage on CT. The posterior fossa, including the cerebellum, brainstem and fourth ventricle, is within normal limits. The third and lateral ventricles, and basal ganglia are unremarkable in appearance. The cerebral hemispheres are symmetric in appearance, with normal gray-white differentiation. No mass effect or midline shift is seen. There is no evidence of fracture; visualized osseous structures are unremarkable in appearance. The orbits are within normal limits. The paranasal sinuses and mastoid air cells are well-aerated. No significant soft tissue abnormalities are seen. IMPRESSION: Unremarkable noncontrast CT of the head. Electronically Signed   By: Roanna RaiderJeffery  Chang M.D.   On: 08/12/2015 19:37   Dg Chest Portable 1 View  08/12/2015  CLINICAL DATA:  Found down, intoxicated.  Status post intubation. EXAM: PORTABLE CHEST 1 VIEW COMPARISON:  Chest x-ray dated 10/09/2012. FINDINGS: Endotracheal tube well positioned with tip just above the level of the carina. Study is hypoinspiratory with crowding of the perihilar bronchovascular markings. Given the low lung volumes, lungs appear clear. No evidence of consolidation or pleural effusion. Osseous structures about the chest are unremarkable. IMPRESSION: Endotracheal tube well positioned with tip just above the level of the carina. Low lung volumes. Electronically Signed   By: Bary RichardStan  Maynard M.D.   On: 08/12/2015 20:20     STUDIES:  Cranial CT scan (5/20) > (-) 2decho 5/20 >   CULTURES: MRSA 5/20 >   ANTIBIOTICS: (-)  SIGNIFICANT EVENTS: 5/20  Admit for AMS/ETOH abuse. Intubated.   LINES/TUBES: (-)  DISCUSSION: 46F, with etoh abuse, chronic pain (on suboxone), with h/o suicide attempts before, with recent HTN in the  sys 170s with pregnancy, admitted for AMS associated with ETOH Abuse/Intoxication.  Intubated for airway protection.    ASSESSMENT / PLAN:  PULMONARY A: Acute Hypoxemic Resp Failure 2/2 Unable to Protect Airway. No signs of infection.  P:   Cont vent. ETT has been readjusted.  PST when she meets criteria. Duoneb QID.   CARDIOVASCULAR A:  HTN related to recent pregnancy. 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 Propofol will control BP. PRN Labetalol.   RENAL A:   No issues P:   Cont with IVF  GASTROINTESTINAL A:   Possible ETOH Liver dse P:   NPO. Start TF in am.  Check amylase, lipase, LFTs.   HEMATOLOGIC A:   No issues P:  Check coags, CBC  INFECTIOUS A:   No signs of infection.  P:   Monitor fever pattern. Check PCT. Check lactate.   ENDOCRINE A:   No issues P:  CBG q4 and sliding scale  NEUROLOGIC A:   AMS likely 2/2 ETOH abuse/withdrawal.  Pt with h/o suicide attempts Chronic pain on Suboxone P:   RASS goal: 0 - -1 Propofol drip, prn fentanyl. Restart Suboxone.  May need psychiatry evaln once extubated > pt has beed depressed and has h/o suicide attempts before.    FAMILY  - Updates: Brother Joen Laura has been updated of pt's condition. Pt is not married and has 2 children < 2 yrs old. Mother is alive but is not around. Arlys John is next of kin making decision. I discussed the case with pts fiance and Arlys John was OK with tha.   - Inter-disciplinary family meet or Palliative Care meeting due by:  5/27  Critical Care time with this pt today : 30 minutes.   Pollie Meyer, MD Pulmonary and Critical Care Medicine [facility] Pager: [phone] After 3 pm or if no response, call [phone]  08/12/2015, 9:11 PM

## 2015-08-12 NOTE — ED Notes (Signed)
1937 - Intubation started. 1938 - 20mg  etomidate given by Raiford Nobleick, RN [phone] - 100mg  succinylcholine given by Raiford Nobleick, RN [phone] - 7.695mm tube inserted to teeth @ 23 by EmdenJeff, [place] 1939 - Positive color change Tabitha, RT. 1940 - bilateral lung sounds heard by Dr. Joni FearsLui 1945 - Temp foley inserted by Riki RuskJeremy, NT

## 2015-08-12 NOTE — ED Notes (Signed)
Main contact for changes, Arlys JohnBrian [phone].

## 2015-08-13 ENCOUNTER — Inpatient Hospital Stay (HOSPITAL_COMMUNITY): Payer: Medicaid Other

## 2015-08-13 ENCOUNTER — Other Ambulatory Visit (HOSPITAL_COMMUNITY): Payer: Self-pay

## 2015-08-13 DIAGNOSIS — F53 Puerperal psychosis: Secondary | ICD-10-CM

## 2015-08-13 DIAGNOSIS — F1120 Opioid dependence, uncomplicated: Secondary | ICD-10-CM

## 2015-08-13 DIAGNOSIS — F10121 Alcohol abuse with intoxication delirium: Secondary | ICD-10-CM

## 2015-08-13 DIAGNOSIS — G9340 Encephalopathy, unspecified: Principal | ICD-10-CM

## 2015-08-13 LAB — CBC
HCT: 38.3 % (ref 36.0–46.0)
Hemoglobin: 12.9 g/dL (ref 12.0–15.0)
MCH: 28.1 pg (ref 26.0–34.0)
MCHC: 33.7 g/dL (ref 30.0–36.0)
MCV: 83.4 fL (ref 78.0–100.0)
PLATELETS: 227 10*3/uL (ref 150–400)
RBC: 4.59 MIL/uL (ref 3.87–5.11)
RDW: 19.7 % — AB (ref 11.5–15.5)
WBC: 12.3 10*3/uL — ABNORMAL HIGH (ref 4.0–10.5)

## 2015-08-13 LAB — BLOOD GAS, ARTERIAL
Acid-base deficit: 0.6 mmol/L (ref 0.0–2.0)
BICARBONATE: 22.2 meq/L (ref 20.0–24.0)
Drawn by: 422461
FIO2: 0.4
LHR: 14 {breaths}/min
O2 SAT: 97.6 %
PATIENT TEMPERATURE: 98.1
PCO2 ART: 31.6 mmHg — AB (ref 35.0–45.0)
PEEP: 5 cmH2O
PH ART: 7.459 — AB (ref 7.350–7.450)
TCO2: 19.7 mmol/L (ref 0–100)
VT: 600 mL
pO2, Arterial: 101 mmHg — ABNORMAL HIGH (ref 80.0–100.0)

## 2015-08-13 LAB — MAGNESIUM: MAGNESIUM: 1.6 mg/dL — AB (ref 1.7–2.4)

## 2015-08-13 LAB — BASIC METABOLIC PANEL
Anion gap: 11 (ref 5–15)
BUN: 12 mg/dL (ref 6–20)
CALCIUM: 7.4 mg/dL — AB (ref 8.9–10.3)
CO2: 23 mmol/L (ref 22–32)
CREATININE: 0.79 mg/dL (ref 0.44–1.00)
Chloride: 110 mmol/L (ref 101–111)
GFR calc Af Amer: >60 mL/min (ref >60)
Glucose, Bld: 113 mg/dL — ABNORMAL HIGH (ref 65–99)
Potassium: 3.7 mmol/L (ref 3.5–5.1)
SODIUM: 144 mmol/L (ref 135–145)

## 2015-08-13 LAB — TROPONIN I
Troponin I: <0.03 ng/mL (ref <0.031)
Troponin I: <0.03 ng/mL (ref <0.031)

## 2015-08-13 LAB — PHOSPHORUS: Phosphorus: 5.9 mg/dL — ABNORMAL HIGH (ref 2.5–4.6)

## 2015-08-13 LAB — TRIGLYCERIDES: Triglycerides: 603 mg/dL — ABNORMAL HIGH (ref <150)

## 2015-08-13 LAB — MRSA PCR SCREENING: MRSA by PCR: NEGATIVE

## 2015-08-13 MED ORDER — MIDAZOLAM HCL 2 MG/2ML IJ SOLN
2.0000 mg | INTRAMUSCULAR | Status: DC | PRN
  Administered 2015-08-13: 2 mg via INTRAVENOUS
  Filled 2015-08-13: qty 2

## 2015-08-13 MED ORDER — LABETALOL HCL 5 MG/ML IV SOLN
20.0000 mg | INTRAVENOUS | Status: DC | PRN
Start: 2015-08-13 — End: 2015-08-14
  Administered 2015-08-13 – 2015-08-14 (×5): 20 mg via INTRAVENOUS
  Filled 2015-08-13 (×6): qty 4

## 2015-08-13 MED ORDER — ADULT MULTIVITAMIN W/MINERALS CH
1.0000 | ORAL_TABLET | Freq: Every day | ORAL | Status: DC
  Administered 2015-08-13 – 2015-08-15 (×3): 1 via ORAL
  Filled 2015-08-13 (×3): qty 1

## 2015-08-13 MED ORDER — ONDANSETRON 4 MG PO TBDP
4.0000 mg | ORAL_TABLET | Freq: Four times a day (QID) | ORAL | Status: DC | PRN
  Filled 2015-08-13: qty 1

## 2015-08-13 MED ORDER — SODIUM CHLORIDE 0.9 % IV SOLN
25.0000 ug/h | INTRAVENOUS | Status: DC
  Administered 2015-08-13: 50 ug/h via INTRAVENOUS
  Filled 2015-08-13: qty 50

## 2015-08-13 MED ORDER — QUETIAPINE FUMARATE 50 MG PO TABS
25.0000 mg | ORAL_TABLET | Freq: Two times a day (BID) | ORAL | Status: DC
  Administered 2015-08-13: 25 mg via ORAL
  Filled 2015-08-13: qty 1

## 2015-08-13 MED ORDER — LOPERAMIDE HCL 2 MG PO CAPS
2.0000 mg | ORAL_CAPSULE | ORAL | Status: DC | PRN
  Administered 2015-08-14: 2 mg via ORAL
  Filled 2015-08-13: qty 1

## 2015-08-13 MED ORDER — FENTANYL CITRATE (PF) 100 MCG/2ML IJ SOLN
50.0000 ug | Freq: Once | INTRAMUSCULAR | Status: AC
  Administered 2015-08-13: 50 ug via INTRAVENOUS

## 2015-08-13 MED ORDER — HALOPERIDOL LACTATE 5 MG/ML IJ SOLN
INTRAMUSCULAR | Status: AC
  Filled 2015-08-13: qty 1

## 2015-08-13 MED ORDER — HALOPERIDOL LACTATE 5 MG/ML IJ SOLN
2.0000 mg | INTRAMUSCULAR | Status: DC | PRN
  Administered 2015-08-13 – 2015-08-14 (×2): 2 mg via INTRAVENOUS
  Filled 2015-08-13 (×3): qty 1

## 2015-08-13 MED ORDER — LORAZEPAM 1 MG PO TABS
1.0000 mg | ORAL_TABLET | Freq: Four times a day (QID) | ORAL | Status: DC | PRN
Start: 2015-08-13 — End: 2015-08-15
  Administered 2015-08-13 – 2015-08-15 (×3): 1 mg via ORAL
  Filled 2015-08-13 (×3): qty 1

## 2015-08-13 MED ORDER — QUETIAPINE FUMARATE 50 MG PO TABS
50.0000 mg | ORAL_TABLET | Freq: Three times a day (TID) | ORAL | Status: DC
  Administered 2015-08-13 – 2015-08-15 (×5): 50 mg via ORAL
  Filled 2015-08-13 (×8): qty 1

## 2015-08-13 MED ORDER — ANTISEPTIC ORAL RINSE SOLUTION (CORINZ)
7.0000 mL | Freq: Four times a day (QID) | OROMUCOSAL | Status: DC
  Administered 2015-08-13: 7 mL via OROMUCOSAL

## 2015-08-13 MED ORDER — HALOPERIDOL LACTATE 5 MG/ML IJ SOLN
3.0000 mg | Freq: Once | INTRAMUSCULAR | Status: AC
  Administered 2015-08-13: 3 mg via INTRAVENOUS

## 2015-08-13 MED ORDER — CLONIDINE HCL 0.1 MG/24HR TD PTWK
0.1000 mg | MEDICATED_PATCH | TRANSDERMAL | Status: DC
  Administered 2015-08-13: 0.1 mg via TRANSDERMAL
  Filled 2015-08-13: qty 1

## 2015-08-13 MED ORDER — HALOPERIDOL LACTATE 5 MG/ML IJ SOLN
INTRAMUSCULAR | Status: AC
  Administered 2015-08-13: 3 mg via INTRAVENOUS
  Filled 2015-08-13: qty 1

## 2015-08-13 MED ORDER — FENTANYL BOLUS VIA INFUSION
50.0000 ug | INTRAVENOUS | Status: DC | PRN
  Filled 2015-08-13: qty 50

## 2015-08-13 MED ORDER — CHLORHEXIDINE GLUCONATE 0.12% ORAL RINSE (MEDLINE KIT)
15.0000 mL | Freq: Two times a day (BID) | OROMUCOSAL | Status: DC
  Administered 2015-08-13: 15 mL via OROMUCOSAL

## 2015-08-13 MED ORDER — HYDROXYZINE HCL 25 MG PO TABS
25.0000 mg | ORAL_TABLET | Freq: Four times a day (QID) | ORAL | Status: DC | PRN
  Administered 2015-08-13 – 2015-08-14 (×2): 25 mg via ORAL
  Filled 2015-08-13 (×3): qty 1

## 2015-08-13 MED ORDER — THIAMINE HCL 100 MG/ML IJ SOLN
100.0000 mg | Freq: Once | INTRAMUSCULAR | Status: AC
  Administered 2015-08-13: 100 mg via INTRAMUSCULAR
  Filled 2015-08-13: qty 2

## 2015-08-13 MED ORDER — VITAMIN B-1 100 MG PO TABS
100.0000 mg | ORAL_TABLET | Freq: Every day | ORAL | Status: DC
  Administered 2015-08-14 – 2015-08-15 (×2): 100 mg via ORAL
  Filled 2015-08-13 (×2): qty 1

## 2015-08-13 MED ORDER — HALOPERIDOL LACTATE 5 MG/ML IJ SOLN
2.0000 mg | Freq: Once | INTRAMUSCULAR | Status: AC
  Administered 2015-08-13: 2 mg via INTRAVENOUS

## 2015-08-13 NOTE — Consult Note (Signed)
[facility] Consult   Reason for Consult:  Alcohol intoxication, polysubstance abuse and questionable suicide Referring Physician:  Dr. Corrie Dandy Patient Identification: Megan Edwards MRN:  [id number] Principal Diagnosis: Alcohol intoxication [facility]) Diagnosis:   Patient Active Problem List   Diagnosis Date Noted  . Alcohol intoxication ([place]) [F10.129] 08/12/2015  . Acute hypoxemic respiratory failure ([place]) [J96.01]   . NSVD (normal spontaneous vaginal delivery) [O80] 06/13/2015  . Normal labor [O80, Z37.9] 06/11/2015  . History of postpartum hemorrhage, currently pregnant [O09.299] 05/11/2015  . Immediate postpartum hemorrhage, with delivery [O72.1] 11/28/2012  . Pregnancy induced hypertension [O13.9] 11/25/2012  . Elderly primigravida, antepartum [O09.519] 06/26/2012  . Polysubstance (excluding opioids) dependence ([place]) [F19.20] 08/11/2011    Total Time spent with patient: 45 minutes  Subjective:   Megan Edwards is a 40 y.o. female patient admitted with alcohol intoxication and status post intentional drug overdose ( ? Phenergan).  HPI:  Megan Edwards is a 40 years old female admitted to [facility] ICU with encephalopathic and required intubation on arrival. Patient was extubated two hours prior to my evaluation as per staff RN. Patient appeared sitting in her bed and screaming loudly when asked she stated that she is not able to breath. Her pulse ox showed 100 %. She could not talk much and went to bed after few minutes of screaming. Staff RN reported that she has taken Seroquel 25 mg which does not seem working for her. Patient is a poor historian and has no family members at bed side. Chart review indicated she has been suffering with polysubstance dependence, chronic pain syndrome, takes suboxone and post partum two months ago. She has been relapsed drinking alcohol a lot for the last 2-3 weeks . She has elevated blood pressure and, reviewed recent EKG, Qtc is within  normal limits. BAL is 140 on arrival and UDS is negative for drug of abuse.  PAST MEDICAL HISTORY: She  has a past medical history of Depression; Genital herpes (2000); Infection; Ovarian cyst; Abnormal Pap smear; SVT (supraventricular tachycardia) ([place]); and Hypertension. Chronic Pain > on SUBOXONE   Past Psychiatric History: Patient has multiple [facility] admissions and her last admission was 2013. She was treated with Seroquel 400 mg a day.   Risk to Self:   Risk to Others:   Prior Inpatient Therapy:   Prior Outpatient Therapy:    Past Medical History:  Past Medical History  Diagnosis Date  . Depression   . Genital herpes 2000  . Infection     UTI  . Ovarian cyst   . Abnormal Pap smear   . SVT (supraventricular tachycardia) ([place])   . Hypertension     Past Surgical History  Procedure Laterality Date  . Knee surgery    . Breast enhancement surgery    . Tonsillectomy    . Addenoidectomy    . Wisdom tooth extraction    . Breast surgery    . Knee surgery Left    Family History:  Family History  Problem Relation Age of Onset  . Alcoholism    . Leukemia    . Cervical cancer    . Bone cancer    . Cirrhosis    . Diabetes Mellitus II    . Depression    . Hypertension    . Thyroid cancer     Family Psychiatric  History: unknown Social History:  History  Alcohol Use  . 0.0 oz/week  . 0 Standard drinks or equivalent per week  Comment: 4 40's beer daily- not currently     History  Drug Use No    Comment: former    Social History   Social History  . Marital Status: Divorced    Spouse Name: N/A  . Number of Children: N/A  . Years of Education: N/A   Occupational History  . Server    Social History Main Topics  . Smoking status: Never Smoker   . Smokeless tobacco: Never Used  . Alcohol Use: 0.0 oz/week    0 Standard drinks or equivalent per week     Comment: 4 40's beer daily- not currently  . Drug Use: No     Comment: former  . Sexual Activity: Not  Currently   Other Topics Concern  . None   Social History Narrative   Additional Social History:    Allergies:   Allergies  Allergen Reactions  . Penicillins Rash    Has patient had a PCN reaction causing immediate rash, facial/tongue/throat swelling, SOB or lightheadedness with hypotension: Yes Has patient had a PCN reaction causing severe rash involving mucus membranes or skin necrosis: No Has patient had a PCN reaction that required hospitalization No Has patient had a PCN reaction occurring within the last 10 years: No If all of the above answers are "NO", then may proceed with Cephalosporin use.    Labs:  Results for orders placed or performed during the hospital encounter of 08/12/15 (from the past 48 hour(s))  Comprehensive metabolic panel     Status: Abnormal   Collection Time: 08/12/15  5:49 PM  Result Value Ref Range   Sodium 138 135 - 145 mmol/L   Potassium 3.9 3.5 - 5.1 mmol/L   Chloride 101 101 - 111 mmol/L   CO2 25 22 - 32 mmol/L   Glucose, Bld 166 (H) 65 - 99 mg/dL   BUN 12 6 - 20 mg/dL   Creatinine, Ser 0.62 0.44 - 1.00 mg/dL   Calcium 8.4 (L) 8.9 - 10.3 mg/dL   Total Protein 7.0 6.5 - 8.1 g/dL   Albumin 3.8 3.5 - 5.0 g/dL   AST 40 15 - 41 U/L   ALT 46 14 - 54 U/L   Alkaline Phosphatase 84 38 - 126 U/L   Total Bilirubin 0.5 0.3 - 1.2 mg/dL   GFR calc non Af Amer >60 >60 mL/min   GFR calc Af Amer >60 >60 mL/min    Comment: (NOTE) The eGFR has been calculated using the CKD EPI equation. This calculation has not been validated in all clinical situations. eGFR's persistently <60 mL/min signify possible Chronic Kidney Disease.    Anion gap 12 5 - 15  Ethanol     Status: Abnormal   Collection Time: 08/12/15  5:49 PM  Result Value Ref Range   Alcohol, Ethyl (B) 140 (H) <5 mg/dL    Comment:        LOWEST DETECTABLE LIMIT FOR SERUM ALCOHOL IS 5 mg/dL FOR MEDICAL PURPOSES ONLY   Salicylate level     Status: None   Collection Time: 08/12/15  5:49 PM   Result Value Ref Range   Salicylate Lvl <0.9 2.8 - 30.0 mg/dL  Acetaminophen level     Status: Abnormal   Collection Time: 08/12/15  5:49 PM  Result Value Ref Range   Acetaminophen (Tylenol), Serum <10 (L) 10 - 30 ug/mL    Comment:        THERAPEUTIC CONCENTRATIONS VARY SIGNIFICANTLY. A RANGE OF 10-30 ug/mL MAY BE AN EFFECTIVE  CONCENTRATION FOR MANY PATIENTS. HOWEVER, SOME ARE BEST TREATED AT CONCENTRATIONS OUTSIDE THIS RANGE. ACETAMINOPHEN CONCENTRATIONS >150 ug/mL AT 4 HOURS AFTER INGESTION AND >50 ug/mL AT 12 HOURS AFTER INGESTION ARE OFTEN ASSOCIATED WITH TOXIC REACTIONS.   CBG monitoring, ED     Status: Abnormal   Collection Time: 08/12/15  5:58 PM  Result Value Ref Range   Glucose-Capillary 186 (H) 65 - 99 mg/dL  Rapid urine drug screen (hospital performed)     Status: None   Collection Time: 08/12/15  7:46 PM  Result Value Ref Range   Opiates NONE DETECTED NONE DETECTED   Cocaine NONE DETECTED NONE DETECTED   Benzodiazepines NONE DETECTED NONE DETECTED   Amphetamines NONE DETECTED NONE DETECTED   Tetrahydrocannabinol NONE DETECTED NONE DETECTED   Barbiturates NONE DETECTED NONE DETECTED    Comment:        DRUG SCREEN FOR MEDICAL PURPOSES ONLY.  IF CONFIRMATION IS NEEDED FOR ANY PURPOSE, NOTIFY LAB WITHIN 5 DAYS.        LOWEST DETECTABLE LIMITS FOR URINE DRUG SCREEN Drug Class       Cutoff (ng/mL) Amphetamine      1000 Barbiturate      200 Benzodiazepine   093 Tricyclics       235 Opiates          300 Cocaine          300 THC              50   Blood gas, arterial     Status: Abnormal   Collection Time: 08/12/15  7:57 PM  Result Value Ref Range   FIO2 1.00    Delivery systems VENTILATOR    Mode PRESSURE REGULATED VOLUME CONTROL    VT 600 mL   LHR 18 resp/min   Peep/cpap 5.0 cm H20   pH, Arterial 7.478 (H) 7.350 - 7.450   pCO2 arterial 29.9 (L) 35.0 - 45.0 mmHg   pO2, Arterial 108 (H) 80.0 - 100.0 mmHg   Bicarbonate 21.9 20.0 - 24.0 mEq/L   TCO2  19.0 0 - 100 mmol/L   Acid-base deficit 0.3 0.0 - 2.0 mmol/L   O2 Saturation 97.7 %   Patient temperature 98.6    Collection site LEFT RADIAL    Drawn by [id number]    Sample type ARTERIAL DRAW    Allens test (pass/fail) PASS PASS  Protime-INR     Status: None   Collection Time: 08/12/15  8:00 PM  Result Value Ref Range   Prothrombin Time 14.1 11.6 - 15.2 seconds   INR 1.11 0.00 - 1.49  CBC     Status: Abnormal   Collection Time: 08/12/15  8:45 PM  Result Value Ref Range   WBC 3.8 (L) 4.0 - 10.5 K/uL   RBC 4.87 3.87 - 5.11 MIL/uL   Hemoglobin 13.7 12.0 - 15.0 g/dL   HCT 40.2 36.0 - 46.0 %   MCV 82.5 78.0 - 100.0 fL   MCH 28.1 26.0 - 34.0 pg   MCHC 34.1 30.0 - 36.0 g/dL   RDW 18.7 (H) 11.5 - 15.5 %   Platelets 219 150 - 400 K/uL  POC CBG, ED     Status: Abnormal   Collection Time: 08/12/15  9:12 PM  Result Value Ref Range   Glucose-Capillary 134 (H) 65 - 99 mg/dL  Magnesium     Status: None   Collection Time: 08/12/15  9:18 PM  Result Value Ref Range   Magnesium 1.8 1.7 - 2.4  mg/dL  Phosphorus     Status: None   Collection Time: 08/12/15  9:18 PM  Result Value Ref Range   Phosphorus 2.9 2.5 - 4.6 mg/dL  Amylase     Status: None   Collection Time: 08/12/15  9:18 PM  Result Value Ref Range   Amylase 36 28 - 100 U/L  Lipase, blood     Status: Abnormal   Collection Time: 08/12/15  9:18 PM  Result Value Ref Range   Lipase 57 (H) 11 - 51 U/L  Troponin I     Status: None   Collection Time: 08/12/15  9:18 PM  Result Value Ref Range   Troponin I <0.03 <0.031 ng/mL    Comment:        NO INDICATION OF MYOCARDIAL INJURY.   Lactic acid, plasma     Status: Abnormal   Collection Time: 08/12/15  9:18 PM  Result Value Ref Range   Lactic Acid, Venous 2.7 (HH) 0.5 - 2.0 mmol/L    Comment: CRITICAL RESULT CALLED TO, READ BACK BY AND VERIFIED WITH: R.COOPER,RN AT 2201 ON 08/12/15 BY W.SHEA   Procalcitonin     Status: None   Collection Time: 08/12/15  9:18 PM  Result Value Ref Range    Procalcitonin <0.10 ng/mL    Comment:        Interpretation: PCT (Procalcitonin) <= 0.5 ng/mL: Systemic infection (sepsis) is not likely. Local bacterial infection is possible. (NOTE)         ICU PCT Algorithm               Non ICU PCT Algorithm    ----------------------------     ------------------------------         PCT < 0.25 ng/mL                 PCT < 0.1 ng/mL     Stopping of antibiotics            Stopping of antibiotics       strongly encouraged.               strongly encouraged.    ----------------------------     ------------------------------       PCT level decrease by               PCT < 0.25 ng/mL       >= 80% from peak PCT       OR PCT 0.25 - 0.5 ng/mL          Stopping of antibiotics                                             encouraged.     Stopping of antibiotics           encouraged.    ----------------------------     ------------------------------       PCT level decrease by              PCT >= 0.25 ng/mL       < 80% from peak PCT        AND PCT >= 0.5 ng/mL            Continuin g antibiotics  encouraged.       Continuing antibiotics            encouraged.    ----------------------------     ------------------------------     PCT level increase compared          PCT > 0.5 ng/mL         with peak PCT AND          PCT >= 0.5 ng/mL             Escalation of antibiotics                                          strongly encouraged.      Escalation of antibiotics        strongly encouraged.   Triglycerides     Status: Abnormal   Collection Time: 08/12/15  9:18 PM  Result Value Ref Range   Triglycerides 603 (H) <150 mg/dL    Comment: Performed at [facility]  MRSA PCR Screening     Status: None   Collection Time: 08/12/15 11:20 PM  Result Value Ref Range   MRSA by PCR NEGATIVE NEGATIVE    Comment:        The GeneXpert MRSA Assay (FDA approved for NASAL specimens only), is one component of  a comprehensive MRSA colonization surveillance program. It is not intended to diagnose MRSA infection nor to guide or monitor treatment for MRSA infections.   Troponin I     Status: None   Collection Time: 08/13/15  3:35 AM  Result Value Ref Range   Troponin I <0.03 <0.031 ng/mL    Comment:        NO INDICATION OF MYOCARDIAL INJURY.   CBC     Status: Abnormal   Collection Time: 08/13/15  3:35 AM  Result Value Ref Range   WBC 12.3 (H) 4.0 - 10.5 K/uL   RBC 4.59 3.87 - 5.11 MIL/uL   Hemoglobin 12.9 12.0 - 15.0 g/dL   HCT 38.3 36.0 - 46.0 %   MCV 83.4 78.0 - 100.0 fL   MCH 28.1 26.0 - 34.0 pg   MCHC 33.7 30.0 - 36.0 g/dL   RDW 19.7 (H) 11.5 - 15.5 %   Platelets 227 150 - 400 K/uL  Basic metabolic panel     Status: Abnormal   Collection Time: 08/13/15  3:35 AM  Result Value Ref Range   Sodium 144 135 - 145 mmol/L   Potassium 3.7 3.5 - 5.1 mmol/L   Chloride 110 101 - 111 mmol/L   CO2 23 22 - 32 mmol/L   Glucose, Bld 113 (H) 65 - 99 mg/dL   BUN 12 6 - 20 mg/dL   Creatinine, Ser 0.79 0.44 - 1.00 mg/dL   Calcium 7.4 (L) 8.9 - 10.3 mg/dL   GFR calc non Af Amer >60 >60 mL/min   GFR calc Af Amer >60 >60 mL/min    Comment: (NOTE) The eGFR has been calculated using the CKD EPI equation. This calculation has not been validated in all clinical situations. eGFR's persistently <60 mL/min signify possible Chronic Kidney Disease.    Anion gap 11 5 - 15  Magnesium     Status: Abnormal   Collection Time: 08/13/15  3:35 AM  Result Value Ref Range   Magnesium 1.6 (L) 1.7 - 2.4 mg/dL  Phosphorus     Status: Abnormal  Collection Time: 08/13/15  3:35 AM  Result Value Ref Range   Phosphorus 5.9 (H) 2.5 - 4.6 mg/dL  Blood gas, arterial     Status: Abnormal   Collection Time: 08/13/15  5:12 AM  Result Value Ref Range   FIO2 0.40    Delivery systems VENTILATOR    Mode PRESSURE REGULATED VOLUME CONTROL    VT 600 mL   LHR 14 resp/min   Peep/cpap 5.0 cm H20   pH, Arterial 7.459 (H)  7.350 - 7.450   pCO2 arterial 31.6 (L) 35.0 - 45.0 mmHg   pO2, Arterial 101 (H) 80.0 - 100.0 mmHg   Bicarbonate 22.2 20.0 - 24.0 mEq/L   TCO2 19.7 0 - 100 mmol/L   Acid-base deficit 0.6 0.0 - 2.0 mmol/L   O2 Saturation 97.6 %   Patient temperature 98.1    Collection site LEFT RADIAL    Drawn by [id number]    Sample type ARTERIAL DRAW    Allens test (pass/fail) PASS PASS  Troponin I     Status: None   Collection Time: 08/13/15  9:26 AM  Result Value Ref Range   Troponin I <0.03 <0.031 ng/mL    Comment:        NO INDICATION OF MYOCARDIAL INJURY.     Current Facility-Administered Medications  Medication Dose Route Frequency Provider Last Rate Last Dose  . 0.9 %  sodium chloride infusion  250 mL Intravenous PRN [place], MD      . antiseptic oral rinse solution (CORINZ)  7 mL Mouth Rinse QID Jose Shirl Harris, MD   7 mL at 08/13/15 0346  . chlorhexidine gluconate (SAGE KIT) (PERIDEX) 0.12 % solution 15 mL  15 mL Mouth Rinse BID Jose Shirl Harris, MD   15 mL at 08/13/15 [phone]  . fentaNYL (SUBLIMAZE) 2,500 mcg in sodium chloride 0.9 % 250 mL (10 mcg/mL) infusion  25-400 mcg/hr Intravenous Continuous Raylene Miyamoto, MD   Stopped at 08/13/15 1000  . fentaNYL (SUBLIMAZE) bolus via infusion 50 mcg  50 mcg Intravenous Q1H PRN Raylene Miyamoto, MD      . fentaNYL (SUBLIMAZE) injection 100 mcg  100 mcg Intravenous Q15 min PRN Jose Shirl Harris, MD   100 mcg at 08/13/15 0336  . fentaNYL (SUBLIMAZE) injection 100 mcg  100 mcg Intravenous Q2H PRN Hope, MD      . heparin injection 5,000 Units  5,000 Units Subcutaneous [place], MD   5,000 Units at 08/13/15 [phone]  . labetalol (NORMODYNE,TRANDATE) injection 20 mg  20 mg Intravenous Q2H PRN Raylene Miyamoto, MD   20 mg at 08/13/15 0840  . midazolam (VERSED) injection 2 mg  2 mg Intravenous Q2H PRN Raylene Miyamoto, MD   2 mg at 08/13/15 8144  . pantoprazole (PROTONIX) injection 40 mg  40 mg  Intravenous QHS Jose Shirl Harris, MD   40 mg at 08/13/15 0029  . propofol (DIPRIVAN) 1000 MG/100ML infusion  0-50 mcg/kg/min Intravenous Continuous [place], MD   Stopped at 08/13/15 1000  . QUEtiapine (SEROQUEL) tablet 25 mg  25 mg Oral BID Rush Farmer, MD   25 mg at 08/13/15 1050    Musculoskeletal: Strength & Muscle Tone: increased Gait & Station: unable to stand Patient leans: N/A  Psychiatric Specialty Exam: ROS complained of SOB and alcohol withdrawal  No Fever-chills, No Headache, No changes with  Vision or hearing, reports vertigo No problems swallowing food or Liquids, No Chest pain, Cough or Shortness of Breath, No Abdominal pain, No Nausea or Vommitting, Bowel movements are regular, No Blood in stool or Urine, No dysuria, No new skin rashes or bruises, No new joints pains-aches,  No new weakness, tingling, numbness in any extremity, No recent weight gain or loss, No polyuria, polydypsia or polyphagia,  A full 10 point Review of Systems was done, except as stated above, all other Review of Systems were negative.  Blood pressure 154/101, pulse 101, temperature 100.2 F (37.9 C), temperature source Axillary, resp. rate 19, height '5\' 9"'$  (1.753 m), weight 92 kg (202 lb 13.2 oz), SpO2 100 %, unknown if currently breastfeeding.Body mass index is 29.94 kg/(m^2).  General Appearance: Bizarre, Disheveled and Guarded  Eye Contact::  Fair  Speech:  Clear and Coherent  Volume:  Increased  Mood:  Anxious, Depressed and Irritable  Affect:  Depressed, Inappropriate, Labile and Tearful  Thought Process:  Irrelevant, Loose and Tangential  Orientation:  Negative  Thought Content:  Rumination  Suicidal Thoughts:  reportedly she was taken phenargan at home prior to arrival. questionable suicide attempt.  Homicidal Thoughts:  No  Memory:  NA  Judgement:  Impaired  Insight:  Lacking  Psychomotor Activity:  Increased and Restlessness  Concentration:  Poor  Recall:   Poor  Fund of Knowledge:Poor  Language: Fair  Akathisia:  Negative  Handed:  Right  AIMS (if indicated):     Assets:  Communication Skills Desire for Improvement Housing Leisure Time Resilience Social Support Transportation  ADL's:  Impaired  Cognition: Impaired,  Moderate  Sleep:      Treatment Plan Summary: Daily contact with patient to assess and evaluate symptoms and progress in treatment and Medication management   Continue suicide sitter as patient can not contract for safety Start Seroquel 50 mg PO TID for agitation and aggression. Monitor of alcohol withdrawal and start Ativan detox treatment She is on Fentanyl - monitor of opioid withdrawal Refer to unit LCSW for collateral from family and may need IVC if needed  Appreciate psychiatric consultation and follow up as clinically required Please contact [phone] or [phone] if needs further assistance  Disposition: Recommend psychiatric Inpatient admission when medically cleared. Supportive therapy provided about ongoing stressors.  Durward Parcel., MD 08/13/2015 12:01 PM

## 2015-08-13 NOTE — Progress Notes (Signed)
PULMONARY / CRITICAL CARE MEDICINE   Name: Megan Edwards MRN: [id number] DOB: 03/10/1976    ADMISSION DATE:  08/12/2015 CONSULTATION DATE:  08/12/2015  REFERRING MD:  ED  CHIEF COMPLAINT:  Unresponsive. ETOH intoxication.   HISTORY OF PRESENT ILLNESS:   Pt is encephalopathic so no history was obtained from her. Chart review was done as well as speaking with pt's boyfriend and brother.  Pt with ETOH abuse, recently started drinking a lot the last 2-3 weeks 2/2 stress.  She had a baby 2 mos ago and was HTNsive during that time and has remained HTNsive with BP 160-170 systolic. Pt has chronic pain issues and she takes Suboxone.   Today, she was seen unresponsive the entire day, getting more somnolent so 911 eventually called. At the ED, BP was 200 systolic.  Intubated for airway protection.  Propofol just started and BP is now 170/100. BAL was 140, UDS was (-).   PCCM consulted to admit pt.   SUBJECTIVE:  Very agitated off sedation this AM, unable to cooperate with weaning trial.  Brother new that she would binge after child birth.  VITAL SIGNS: BP 188/94 mmHg  Pulse 101  Temp(Src) 99.4 F (37.4 C) (Axillary)  Resp 18  Ht 5\' 9"  (1.753 m)  Wt 92 kg (202 lb 13.2 oz)  BMI 29.94 kg/m2  SpO2 100%  HEMODYNAMICS:    VENTILATOR SETTINGS: Vent Mode:  [-] PRVC FiO2 (%):  [40 %-100 %] 40 % Set Rate:  [14 bmp-18 bmp] 14 bmp Vt Set:  [600 mL] 600 mL PEEP:  [5 cmH20] 5 cmH20 Plateau Pressure:  [17 cmH20-19 cmH20] 17 cmH20  INTAKE / OUTPUT: I/O last 3 completed shifts: In: 2853.9 [I.V.:2853.9] Out: 2900 [Urine:2900]  PHYSICAL EXAMINATION: General:  Very agitated, not cooperative with exam or weaning. Neuro:  Moving all ext spontaneously. HEENT:  PERLA, (-) NVD, ETT in place.  Cardiovascular:  Good s1/s2. (-) s3/m/r/g Lungs:  Good ae. Dec BS BLF. cta Abdomen:  (+) BS, soft, NT, (-) masses/tenderness Musculoskeletal:  (-) gross abn.  Skin:  With previous suicide marks in B  forearms. (-) edema/rash/clubbing/cyanosis.   LABS:  BMET  Recent Labs Lab 08/12/15 1749 08/13/15 0335  NA 138 144  K 3.9 3.7  CL 101 110  CO2 25 23  BUN 12 12  CREATININE 0.62 0.79  GLUCOSE 166* 113*   Electrolytes  Recent Labs Lab 08/12/15 1749 08/12/15 2118 08/13/15 0335  CALCIUM 8.4*  --  7.4*  MG  --  1.8 1.6*  PHOS  --  2.9 5.9*   CBC  Recent Labs Lab 08/12/15 2045 08/13/15 0335  WBC 3.8* 12.3*  HGB 13.7 12.9  HCT 40.2 38.3  PLT 219 227   Coag's  Recent Labs Lab 08/12/15 2000  INR 1.11   Sepsis Markers  Recent Labs Lab 08/12/15 2118  LATICACIDVEN 2.7*  PROCALCITON <0.10   ABG  Recent Labs Lab 08/12/15 1957 08/13/15 0512  PHART 7.478* 7.459*  PCO2ART 29.9* 31.6*  PO2ART 108* 101*   Liver Enzymes  Recent Labs Lab 08/12/15 1749  AST 40  ALT 46  ALKPHOS 84  BILITOT 0.5  ALBUMIN 3.8   Cardiac Enzymes  Recent Labs Lab 08/12/15 2118 08/13/15 0335  TROPONINI <0.03 <0.03   Glucose  Recent Labs Lab 08/12/15 1758 08/12/15 2112  GLUCAP 186* 134*   Imaging Ct Head Wo Contrast  08/12/2015  CLINICAL DATA:  Found naked and incapacitated. May have overdosed on Phenergan. Initial encounter. EXAM: CT  HEAD WITHOUT CONTRAST TECHNIQUE: Contiguous axial images were obtained from the base of the skull through the vertex without intravenous contrast. COMPARISON:  CT of the head performed 09/21/2010 FINDINGS: There is no evidence of acute infarction, mass lesion, or intra- or extra-axial hemorrhage on CT. The posterior fossa, including the cerebellum, brainstem and fourth ventricle, is within normal limits. The third and lateral ventricles, and basal ganglia are unremarkable in appearance. The cerebral hemispheres are symmetric in appearance, with normal gray-white differentiation. No mass effect or midline shift is seen. There is no evidence of fracture; visualized osseous structures are unremarkable in appearance. The orbits are within  normal limits. The paranasal sinuses and mastoid air cells are well-aerated. No significant soft tissue abnormalities are seen. IMPRESSION: Unremarkable noncontrast CT of the head. Electronically Signed   By: Roanna RaiderJeffery  Chang M.D.   On: 08/12/2015 19:37   Ct Cervical Spine Wo Contrast  08/12/2015  CLINICAL DATA:  Found naked and incapacitated. May have overdosed on Phenergan. Initial encounter. EXAM: CT HEAD WITHOUT CONTRAST TECHNIQUE: Contiguous axial images were obtained from the base of the skull through the vertex without intravenous contrast. COMPARISON:  CT of the head performed 09/21/2010 FINDINGS: There is no evidence of acute infarction, mass lesion, or intra- or extra-axial hemorrhage on CT. The posterior fossa, including the cerebellum, brainstem and fourth ventricle, is within normal limits. The third and lateral ventricles, and basal ganglia are unremarkable in appearance. The cerebral hemispheres are symmetric in appearance, with normal gray-white differentiation. No mass effect or midline shift is seen. There is no evidence of fracture; visualized osseous structures are unremarkable in appearance. The orbits are within normal limits. The paranasal sinuses and mastoid air cells are well-aerated. No significant soft tissue abnormalities are seen. IMPRESSION: Unremarkable noncontrast CT of the head. Electronically Signed   By: Roanna RaiderJeffery  Chang M.D.   On: 08/12/2015 19:37   Dg Chest Port 1 View  08/13/2015  CLINICAL DATA:  Respiratory failure EXAM: PORTABLE CHEST 1 VIEW COMPARISON:  Chest radiograph from one day prior. FINDINGS: Endotracheal tube tip is 3.3 cm above the carina. Enteric tube enters stomach with the tip not seen on this image. Stable cardiomediastinal silhouette with normal heart size. No pneumothorax. No pleural effusion. Low lung volumes. Vascular crowding without overt pulmonary edema. Stable mild left basilar atelectasis. IMPRESSION: 1. Well-positioned endotracheal tube. 2. Low lung  volumes. Vascular crowding without overt pulmonary edema. Stable mild left basilar atelectasis. Electronically Signed   By: Delbert PhenixJason A Poff M.D.   On: 08/13/2015 08:36   Dg Chest Portable 1 View  08/12/2015  CLINICAL DATA:  Status post intubation. EXAM: PORTABLE CHEST 1 VIEW COMPARISON:  Aug 12, 2015 FINDINGS: The ETT is is in good position, below the thoracic inlet and above the carina. No pneumothorax. The heart, hila, and mediastinum are unchanged. No pulmonary nodules or masses. Possible mild pulmonary venous congestion. IMPRESSION: The ETT is in good position.  Mild pulmonary venous congestion. Electronically Signed   By: Gerome Samavid  Williams III M.D   On: 08/12/2015 21:33   Dg Chest Portable 1 View  08/12/2015  CLINICAL DATA:  Found down, intoxicated.  Status post intubation. EXAM: PORTABLE CHEST 1 VIEW COMPARISON:  Chest x-ray dated 10/09/2012. FINDINGS: Endotracheal tube well positioned with tip just above the level of the carina. Study is hypoinspiratory with crowding of the perihilar bronchovascular markings. Given the low lung volumes, lungs appear clear. No evidence of consolidation or pleural effusion. Osseous structures about the chest are unremarkable. IMPRESSION: Endotracheal tube  well positioned with tip just above the level of the carina. Low lung volumes. Electronically Signed   By: Bary Richard M.D.   On: 08/12/2015 20:20   STUDIES:  Cranial CT scan (5/20) > (-) 2d echo 5/20 >   CULTURES: MRSA 5/20 >   ANTIBIOTICS: (-)  SIGNIFICANT EVENTS: 5/20  Admit for AMS/ETOH abuse. Intubated.   LINES/TUBES: (-)  DISCUSSION: 70F, with etoh abuse, chronic pain (on suboxone), with h/o suicide attempts before, with recent HTN in the  sys 170s with pregnancy, admitted for AMS associated with ETOH Abuse/Intoxication.  Intubated for airway protection.   ASSESSMENT / PLAN:  PULMONARY A: Acute Hypoxemic Resp Failure 2/2 Unable to Protect Airway. No signs of infection.  P:   Extubate. PRN  albuterol. Titrate O2 for sat of 88-92%.  CARDIOVASCULAR A:  HTN related to recent pregnancy. 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 2DEcho. Labetalol for BP control, change to PRN as I believe this is all agitation related.  RENAL A:   No issues P:   KVO IVF. BMET in AM. Replace electrolytes as indicated.  GASTROINTESTINAL A:   Possible ETOH Liver dse P:   Regular diet. LFTs in AM.  HEMATOLOGIC A:   No issues P:  Coags, CBC  INFECTIOUS A:   No signs of infection.  P:   Monitor fever pattern. Hold off abx.  ENDOCRINE A:   No issues P:   CBG q4 and sliding scale  NEUROLOGIC A:   AMS likely 2/2 ETOH abuse/withdrawal.  Pt with h/o suicide attempts Chronic pain on Suboxone Per discussion with family, frequent suicide attempts in the past. P:   Seroquel 25 BID ordered. Psych consult called. Suicide precautions. Suicide sitter.  FAMILY  - Updates: Brother Joen Laura has been updated of pt's condition bedside 5/21.  Also the father of her children was updated.  They were made aware of psych needs and will inform the rest of the family.  Patient is not to leave AMA until seen by psych, will IVC her if needs be.  - Inter-disciplinary family meet or Palliative Care meeting due by:  5/27  The patient is critically ill with multiple organ systems failure and requires high complexity decision making for assessment and support, frequent evaluation and titration of therapies, application of advanced monitoring technologies and extensive interpretation of multiple databases.   Critical Care Time devoted to patient care services described in this note is  35  Minutes. This time reflects time of care of this signee Dr Koren Bound. This critical care time does not reflect procedure time, or teaching time or supervisory time of PA/NP/Med student/Med Resident etc but could involve care discussion time.  Alyson Reedy, M.D. [facility] Pulmonary/Critical Care  Medicine. Pager: [phone]. After hours pager: [phone].  08/13/2015, 10:42 AM

## 2015-08-13 NOTE — Progress Notes (Signed)
Pt's belongings--clothes and medications--returned to her brother Arlys JohnBrian to take home.

## 2015-08-13 NOTE — Progress Notes (Signed)
Patient extremely agitated and attempting to get out bed. Patient unable to be reoriented by staff members. Dr. Molli KnockYacoub paged and made aware of agitation. Psych MD has not rounded on patient as of yet. Haldol 3mg  IV x 1 ordered. Suicide sitter at bedside.

## 2015-08-13 NOTE — Progress Notes (Signed)
eLink Physician-Brief Progress Note Patient Name: Megan Edwards DOB: [birth year] MRN: [id number]   Date of Service  08/13/2015  HPI/Events of Note  Agitation - request for wrist restraints and [facility].   eICU Interventions  Will order bilateral wrist restraints and [employer].      Intervention Category Minor Interventions: Agitation / anxiety - evaluation and management  [name],Carrah Eppolito Eugene 08/13/2015, 10:58 PM

## 2015-08-13 NOTE — Progress Notes (Signed)
eLink Physician-Brief Progress Note Patient Name: Megan HalstedKelly L Edwards DOB: 1976/01/20 MRN: [id number]   Date of Service  08/13/2015  HPI/Events of Note  aggitation remains on prop Add fent Prn versed htn noted, increase labetolol  eICU Interventions       Intervention Category Intermediate Interventions: Medication change / dose adjustment  Nelda BucksFEINSTEIN,Alixandria Friedt J. 08/13/2015, 5:18 AM

## 2015-08-13 NOTE — Progress Notes (Signed)
Utilization review completed.  

## 2015-08-13 NOTE — Progress Notes (Signed)
Suicide sitter order in place. However, pt is heavily sedated and on the ventilator and does not--at this moment-- meet the criteria for a bedside suicide sitter per the [facility] policy and procedure. Will continue to assess.

## 2015-08-13 NOTE — Progress Notes (Signed)
eLink Physician-Brief Progress Note Patient Name: Megan Edwards DOB: 05-18-1975 MRN: [id number]   Date of Service  08/13/2015  HPI/Events of Note  Agitated Delirium - QTc interval = 0.44 seconds.   eICU Interventions  Will order: 1. Haldol 2 mg IV now and Q 3 hours PRN. 2. Monitor QTc interval Q 6 hours. Notify MD if QTc interval > 500 milliseconds.      Intervention Category Minor Interventions: Agitation / anxiety - evaluation and management  [name],[name] 08/13/2015, 8:54 PM

## 2015-08-13 NOTE — Progress Notes (Signed)
eLink Physician-Brief Progress Note Patient Name: Megan Edwards DOB: 01-03-1976 MRN: [id number]   Date of Service  08/13/2015  HPI/Events of Note  Hypertension - BP = 182/96. Currently on Labetalol IV 20 mg IV Q 2 hours PRN.  eICU Interventions  Will add Catapres patch 0.1 mg to skin Q 7 days.     Intervention Category Intermediate Interventions: Hypertension - evaluation and management  [name] 08/13/2015, 4:24 PM

## 2015-08-14 ENCOUNTER — Other Ambulatory Visit (HOSPITAL_COMMUNITY): Payer: Self-pay

## 2015-08-14 ENCOUNTER — Inpatient Hospital Stay (HOSPITAL_COMMUNITY): Payer: Medicaid Other

## 2015-08-14 DIAGNOSIS — I2510 Atherosclerotic heart disease of native coronary artery without angina pectoris: Secondary | ICD-10-CM

## 2015-08-14 LAB — ECHOCARDIOGRAM COMPLETE
Height: 69 in
WEIGHTICAEL: 3132.3 [oz_av]

## 2015-08-14 LAB — BASIC METABOLIC PANEL
ANION GAP: 8 (ref 5–15)
BUN: 11 mg/dL (ref 6–20)
CHLORIDE: 106 mmol/L (ref 101–111)
CO2: 24 mmol/L (ref 22–32)
Calcium: 8.2 mg/dL — ABNORMAL LOW (ref 8.9–10.3)
Creatinine, Ser: 0.73 mg/dL (ref 0.44–1.00)
Glucose, Bld: 103 mg/dL — ABNORMAL HIGH (ref 65–99)
POTASSIUM: 2.9 mmol/L — AB (ref 3.5–5.1)
SODIUM: 138 mmol/L (ref 135–145)

## 2015-08-14 LAB — CBC
HCT: 35.4 % — ABNORMAL LOW (ref 36.0–46.0)
HEMOGLOBIN: 11.7 g/dL — AB (ref 12.0–15.0)
MCH: 28.3 pg (ref 26.0–34.0)
MCHC: 33.1 g/dL (ref 30.0–36.0)
MCV: 85.5 fL (ref 78.0–100.0)
PLATELETS: 166 10*3/uL (ref 150–400)
RBC: 4.14 MIL/uL (ref 3.87–5.11)
RDW: 18.9 % — ABNORMAL HIGH (ref 11.5–15.5)
WBC: 10.4 10*3/uL (ref 4.0–10.5)

## 2015-08-14 LAB — HEPATIC FUNCTION PANEL
ALBUMIN: 3.3 g/dL — AB (ref 3.5–5.0)
ALT: 30 U/L (ref 14–54)
AST: 25 U/L (ref 15–41)
Alkaline Phosphatase: 90 U/L (ref 38–126)
BILIRUBIN TOTAL: 1.4 mg/dL — AB (ref 0.3–1.2)
Bilirubin, Direct: 0.3 mg/dL (ref 0.1–0.5)
Indirect Bilirubin: 1.1 mg/dL — ABNORMAL HIGH (ref 0.3–0.9)
TOTAL PROTEIN: 6.2 g/dL — AB (ref 6.5–8.1)

## 2015-08-14 LAB — MAGNESIUM: MAGNESIUM: 1.9 mg/dL (ref 1.7–2.4)

## 2015-08-14 LAB — PHOSPHORUS: PHOSPHORUS: 3.4 mg/dL (ref 2.5–4.6)

## 2015-08-14 MED ORDER — LABETALOL HCL 200 MG PO TABS
200.0000 mg | ORAL_TABLET | Freq: Three times a day (TID) | ORAL | Status: DC
  Administered 2015-08-14 – 2015-08-15 (×4): 200 mg via ORAL
  Filled 2015-08-14 (×6): qty 1

## 2015-08-14 MED ORDER — POTASSIUM CHLORIDE CRYS ER 20 MEQ PO TBCR
40.0000 meq | EXTENDED_RELEASE_TABLET | Freq: Once | ORAL | Status: AC
  Administered 2015-08-14: 40 meq via ORAL
  Filled 2015-08-14: qty 2

## 2015-08-14 MED ORDER — ENSURE ENLIVE PO LIQD
237.0000 mL | Freq: Two times a day (BID) | ORAL | Status: DC
  Administered 2015-08-14: 237 mL via ORAL

## 2015-08-14 MED ORDER — POTASSIUM CHLORIDE CRYS ER 20 MEQ PO TBCR
40.0000 meq | EXTENDED_RELEASE_TABLET | ORAL | Status: AC
  Administered 2015-08-14 (×2): 40 meq via ORAL
  Filled 2015-08-14 (×2): qty 2

## 2015-08-14 MED ORDER — PANTOPRAZOLE SODIUM 40 MG PO TBEC
40.0000 mg | DELAYED_RELEASE_TABLET | Freq: Every day | ORAL | Status: DC
  Administered 2015-08-14: 40 mg via ORAL
  Filled 2015-08-14 (×2): qty 1

## 2015-08-14 NOTE — Progress Notes (Signed)
  Echocardiogram 2D Echocardiogram has been performed.  Janalyn HarderWest, Esmeralda Blanford R 08/14/2015, 8:43 AM

## 2015-08-14 NOTE — Progress Notes (Signed)
Initial Nutrition Assessment  DOCUMENTATION CODES:   Not applicable  INTERVENTION:  Ensure Enlive BID. Each supplement provides 350 kcals and 20 grams of protein.  NUTRITION DIAGNOSIS:   Increased nutrient needs related to  (alcoholism) as evidenced by estimated needs.  GOAL:   Patient will meet greater than or equal to 90% of their needs  MONITOR:   PO intake, Supplement acceptance, Weight trends, Labs, Skin, I & O's  REASON FOR ASSESSMENT:   Other (Comment) (recent extubation w/ diet advancement/ Weight loss)    ASSESSMENT:   Pt with etoh abuse, chronic pain (on suboxone), with h/o suicide attempts before, with recent HTN in the sys 170s with pregnancy, admitted for AMS associated with ETOH Abuse/Intoxication. Intubated for airway protection. Now extubated. No distress. Will move out of ICU. Stable for transfer to Psych at this point. Will need to continue to watch for evidence of detox.   Pt was extubated yesterday. Consult for TF management was cancelled d/t extubation.  Pt with hx of ETOH abuse. Has gotten worse over past 2-3 weeks.  Per chart, pt has experienced a 7% weight loss since admission 2 days ago. This is significant for time frame. Pt is -.5 L since admission, no excessive fluid retention or fluid losses. No use of diuretics.  Pt reports UBW of 190 lbs. Current weight is 195 lbs, consistent with pt's report. Pt had a baby 2 months ago and reports losing baby weight but denies unintentional weight loss.   Pt reports eating QID PTA.  Pt has good appetite, no N/V. Per chart, pt ate 100% of breakfast today. Pt does not drink nutritional supplements at home. Pt is amenable to Ensure BID.   NFPE: no muscle or fat depletion, no edema.  Labs reviewed; K 2.9, Ca 8.2, glucose 103.  Meds reviewed; MVI w/ minerals, KCl, B1.   Diet Order:  Diet regular Room service appropriate?: Yes; Fluid consistency:: Thin  Skin:  Reviewed, no issues  Last BM:   unknown  Height:   Ht Readings from Last 1 Encounters:  08/12/15 5\' 9"  (1.753 m)    Weight:   Wt Readings from Last 1 Encounters:  08/14/15 195 lb 12.3 oz (88.8 kg)    Ideal Body Weight:  65.9 kg  BMI:  Body mass index is 28.9 kg/(m^2).  Estimated Nutritional Needs:   Kcal:  1850-2050  Protein:  90-100  Fluid:  2 L  EDUCATION NEEDS:   No education needs identified at this time  Beryle QuantMeredith Graysin Luczynski, MS [facility] Dietetic Intern Pager [phone]

## 2015-08-14 NOTE — Consult Note (Signed)
[facility] Face-to-Face Psychiatry Consult follow-up  Reason for Consult:  Alcohol intoxication, polysubstance abuse and questionable suicide Referring Physician:  Dr. Corrie Dandy Patient Identification: Megan Edwards MRN:  [id number] Principal Diagnosis: Alcohol intoxication [facility]) Diagnosis:   Patient Active Problem List   Diagnosis Date Noted  . Altered mental status [R41.82] 08/13/2015  . Alcohol intoxication ([place]) [F10.129] 08/12/2015  . Acute hypoxemic respiratory failure ([place]) [J96.01]   . NSVD (normal spontaneous vaginal delivery) [O80] 06/13/2015  . Normal labor [O80, Z37.9] 06/11/2015  . History of postpartum hemorrhage, currently pregnant [O09.299] 05/11/2015  . Immediate postpartum hemorrhage, with delivery [O72.1] 11/28/2012  . Pregnancy induced hypertension [O13.9] 11/25/2012  . Elderly primigravida, antepartum [O09.519] 06/26/2012  . Polysubstance (excluding opioids) dependence ([place]) [F19.20] 08/11/2011    Total Time spent with patient: 30 minutes  Subjective:   Megan Edwards is a 40 y.o. female patient admitted with alcohol intoxication and status post intentional drug overdose ( ? Phenergan).  HPI:  Megan Edwards is a 40 years old female admitted to [facility] ICU with encephalopathic and required intubation on arrival. Patient was extubated two hours prior to my evaluation as per staff RN. Patient appeared sitting in her bed and screaming loudly when asked she stated that she is not able to breath. Her pulse ox showed 100 %. She could not talk much and went to bed after few minutes of screaming. Staff RN reported that she has taken Seroquel 25 mg which does not seem working for her. Patient is a poor historian and has no family members at bed side. Chart review indicated she has been suffering with polysubstance dependence, chronic pain syndrome, takes suboxone and post partum two months ago. She has been relapsed drinking alcohol a lot for the last 2-3 weeks . She has elevated  blood pressure and, reviewed recent EKG, Qtc is within normal limits. BAL is 140 on arrival and UDS is negative for drug of abuse.  Past Psychiatric History: Patient has multiple [facility] admissions and her last admission was 2013. She was treated with Seroquel 400 mg a day.   Interval history: Patient seen today for psychiatric consultation follow-up. Patient appeared sitting in a chair next to her bed, awake, alert, oriented to time place person. Patient is a calm and cooperative. Patient is anxious about going home and spending time with her children, father of the child and mother. Patient feels regrets about recent relapse of alcohol abuse. Patient also reportedly taking Suboxone from the [facility] and was received psychiatric medication from [place]. Patient stated she is willing to participate in chemical dependency intensive outpatient program. Patient denies current symptoms of depression, mania, psychosis, suicidal/homicidal ideation, intention or plans.  Risk to Self:   Risk to Others:   Prior Inpatient Therapy:   Prior Outpatient Therapy:    Past Medical History:  Past Medical History  Diagnosis Date  . Depression   . Genital herpes 2000  . Infection     UTI  . Ovarian cyst   . Abnormal Pap smear   . SVT (supraventricular tachycardia) ([place])   . Hypertension     Past Surgical History  Procedure Laterality Date  . Knee surgery    . Breast enhancement surgery    . Tonsillectomy    . Addenoidectomy    . Wisdom tooth extraction    . Breast surgery    . Knee surgery Left    Family History:  Family History  Problem Relation Age of Onset  . Alcoholism    .  Leukemia    . Cervical cancer    . Bone cancer    . Cirrhosis    . Diabetes Mellitus II    . Depression    . Hypertension    . Thyroid cancer     Family Psychiatric  History: unknown Social History:  History  Alcohol Use  . 0.0 oz/week  . 0 Standard drinks or equivalent per week    Comment: 4 40's beer  daily- not currently     History  Drug Use No    Comment: former    Social History   Social History  . Marital Status: Divorced    Spouse Name: N/A  . Number of Children: N/A  . Years of Education: N/A   Occupational History  . Server    Social History Main Topics  . Smoking status: Never Smoker   . Smokeless tobacco: Never Used  . Alcohol Use: 0.0 oz/week    0 Standard drinks or equivalent per week     Comment: 4 40's beer daily- not currently  . Drug Use: No     Comment: former  . Sexual Activity: Not Currently   Other Topics Concern  . None   Social History Narrative   Additional Social History:    Allergies:   Allergies  Allergen Reactions  . Penicillins Rash    Has patient had a PCN reaction causing immediate rash, facial/tongue/throat swelling, SOB or lightheadedness with hypotension: Yes Has patient had a PCN reaction causing severe rash involving mucus membranes or skin necrosis: No Has patient had a PCN reaction that required hospitalization No Has patient had a PCN reaction occurring within the last 10 years: No If all of the above answers are "NO", then may proceed with Cephalosporin use.    Labs:  Results for orders placed or performed during the hospital encounter of 08/12/15 (from the past 48 hour(s))  Comprehensive metabolic panel     Status: Abnormal   Collection Time: 08/12/15  5:49 PM  Result Value Ref Range   Sodium 138 135 - 145 mmol/L   Potassium 3.9 3.5 - 5.1 mmol/L   Chloride 101 101 - 111 mmol/L   CO2 25 22 - 32 mmol/L   Glucose, Bld 166 (H) 65 - 99 mg/dL   BUN 12 6 - 20 mg/dL   Creatinine, Ser 0.62 0.44 - 1.00 mg/dL   Calcium 8.4 (L) 8.9 - 10.3 mg/dL   Total Protein 7.0 6.5 - 8.1 g/dL   Albumin 3.8 3.5 - 5.0 g/dL   AST 40 15 - 41 U/L   ALT 46 14 - 54 U/L   Alkaline Phosphatase 84 38 - 126 U/L   Total Bilirubin 0.5 0.3 - 1.2 mg/dL   GFR calc non Af Amer >60 >60 mL/min   GFR calc Af Amer >60 >60 mL/min    Comment: (NOTE) The  eGFR has been calculated using the CKD EPI equation. This calculation has not been validated in all clinical situations. eGFR's persistently <60 mL/min signify possible Chronic Kidney Disease.    Anion gap 12 5 - 15  Ethanol     Status: Abnormal   Collection Time: 08/12/15  5:49 PM  Result Value Ref Range   Alcohol, Ethyl (B) 140 (H) <5 mg/dL    Comment:        LOWEST DETECTABLE LIMIT FOR SERUM ALCOHOL IS 5 mg/dL FOR MEDICAL PURPOSES ONLY   Salicylate level     Status: None   Collection Time: 08/12/15  5:49 PM  Result Value Ref Range   Salicylate Lvl <6.3 2.8 - 30.0 mg/dL  Acetaminophen level     Status: Abnormal   Collection Time: 08/12/15  5:49 PM  Result Value Ref Range   Acetaminophen (Tylenol), Serum <10 (L) 10 - 30 ug/mL    Comment:        THERAPEUTIC CONCENTRATIONS VARY SIGNIFICANTLY. A RANGE OF 10-30 ug/mL MAY BE AN EFFECTIVE CONCENTRATION FOR MANY PATIENTS. HOWEVER, SOME ARE BEST TREATED AT CONCENTRATIONS OUTSIDE THIS RANGE. ACETAMINOPHEN CONCENTRATIONS >150 ug/mL AT 4 HOURS AFTER INGESTION AND >50 ug/mL AT 12 HOURS AFTER INGESTION ARE OFTEN ASSOCIATED WITH TOXIC REACTIONS.   CBG monitoring, ED     Status: Abnormal   Collection Time: 08/12/15  5:58 PM  Result Value Ref Range   Glucose-Capillary 186 (H) 65 - 99 mg/dL  Rapid urine drug screen (hospital performed)     Status: None   Collection Time: 08/12/15  7:46 PM  Result Value Ref Range   Opiates NONE DETECTED NONE DETECTED   Cocaine NONE DETECTED NONE DETECTED   Benzodiazepines NONE DETECTED NONE DETECTED   Amphetamines NONE DETECTED NONE DETECTED   Tetrahydrocannabinol NONE DETECTED NONE DETECTED   Barbiturates NONE DETECTED NONE DETECTED    Comment:        DRUG SCREEN FOR MEDICAL PURPOSES ONLY.  IF CONFIRMATION IS NEEDED FOR ANY PURPOSE, NOTIFY LAB WITHIN 5 DAYS.        LOWEST DETECTABLE LIMITS FOR URINE DRUG SCREEN Drug Class       Cutoff (ng/mL) Amphetamine      1000 Barbiturate       200 Benzodiazepine   149 Tricyclics       702 Opiates          300 Cocaine          300 THC              50   Blood gas, arterial     Status: Abnormal   Collection Time: 08/12/15  7:57 PM  Result Value Ref Range   FIO2 1.00    Delivery systems VENTILATOR    Mode PRESSURE REGULATED VOLUME CONTROL    VT 600 mL   LHR 18 resp/min   Peep/cpap 5.0 cm H20   pH, Arterial 7.478 (H) 7.350 - 7.450   pCO2 arterial 29.9 (L) 35.0 - 45.0 mmHg   pO2, Arterial 108 (H) 80.0 - 100.0 mmHg   Bicarbonate 21.9 20.0 - 24.0 mEq/L   TCO2 19.0 0 - 100 mmol/L   Acid-base deficit 0.3 0.0 - 2.0 mmol/L   O2 Saturation 97.7 %   Patient temperature 98.6    Collection site LEFT RADIAL    Drawn by [id number]    Sample type ARTERIAL DRAW    Allens test (pass/fail) PASS PASS  Protime-INR     Status: None   Collection Time: 08/12/15  8:00 PM  Result Value Ref Range   Prothrombin Time 14.1 11.6 - 15.2 seconds   INR 1.11 0.00 - 1.49  CBC     Status: Abnormal   Collection Time: 08/12/15  8:45 PM  Result Value Ref Range   WBC 3.8 (L) 4.0 - 10.5 K/uL   RBC 4.87 3.87 - 5.11 MIL/uL   Hemoglobin 13.7 12.0 - 15.0 g/dL   HCT 40.2 36.0 - 46.0 %   MCV 82.5 78.0 - 100.0 fL   MCH 28.1 26.0 - 34.0 pg   MCHC 34.1 30.0 - 36.0 g/dL   RDW 18.7 (  H) 11.5 - 15.5 %   Platelets 219 150 - 400 K/uL  POC CBG, ED     Status: Abnormal   Collection Time: 08/12/15  9:12 PM  Result Value Ref Range   Glucose-Capillary 134 (H) 65 - 99 mg/dL  Magnesium     Status: None   Collection Time: 08/12/15  9:18 PM  Result Value Ref Range   Magnesium 1.8 1.7 - 2.4 mg/dL  Phosphorus     Status: None   Collection Time: 08/12/15  9:18 PM  Result Value Ref Range   Phosphorus 2.9 2.5 - 4.6 mg/dL  Amylase     Status: None   Collection Time: 08/12/15  9:18 PM  Result Value Ref Range   Amylase 36 28 - 100 U/L  Lipase, blood     Status: Abnormal   Collection Time: 08/12/15  9:18 PM  Result Value Ref Range   Lipase 57 (H) 11 - 51 U/L  Troponin I      Status: None   Collection Time: 08/12/15  9:18 PM  Result Value Ref Range   Troponin I <0.03 <0.031 ng/mL    Comment:        NO INDICATION OF MYOCARDIAL INJURY.   Lactic acid, plasma     Status: Abnormal   Collection Time: 08/12/15  9:18 PM  Result Value Ref Range   Lactic Acid, Venous 2.7 (HH) 0.5 - 2.0 mmol/L    Comment: CRITICAL RESULT CALLED TO, READ BACK BY AND VERIFIED WITH: R.COOPER,RN AT 2201 ON 08/12/15 BY W.SHEA   Procalcitonin     Status: None   Collection Time: 08/12/15  9:18 PM  Result Value Ref Range   Procalcitonin <0.10 ng/mL    Comment:        Interpretation: PCT (Procalcitonin) <= 0.5 ng/mL: Systemic infection (sepsis) is not likely. Local bacterial infection is possible. (NOTE)         ICU PCT Algorithm               Non ICU PCT Algorithm    ----------------------------     ------------------------------         PCT < 0.25 ng/mL                 PCT < 0.1 ng/mL     Stopping of antibiotics            Stopping of antibiotics       strongly encouraged.               strongly encouraged.    ----------------------------     ------------------------------       PCT level decrease by               PCT < 0.25 ng/mL       >= 80% from peak PCT       OR PCT 0.25 - 0.5 ng/mL          Stopping of antibiotics                                             encouraged.     Stopping of antibiotics           encouraged.    ----------------------------     ------------------------------       PCT level decrease by  PCT >= 0.25 ng/mL       < 80% from peak PCT        AND PCT >= 0.5 ng/mL            Continuin g antibiotics                                              encouraged.       Continuing antibiotics            encouraged.    ----------------------------     ------------------------------     PCT level increase compared          PCT > 0.5 ng/mL         with peak PCT AND          PCT >= 0.5 ng/mL             Escalation of antibiotics                                           strongly encouraged.      Escalation of antibiotics        strongly encouraged.   Triglycerides     Status: Abnormal   Collection Time: 08/12/15  9:18 PM  Result Value Ref Range   Triglycerides 603 (H) <150 mg/dL    Comment: Performed at [facility]  MRSA PCR Screening     Status: None   Collection Time: 08/12/15 11:20 PM  Result Value Ref Range   MRSA by PCR NEGATIVE NEGATIVE    Comment:        The GeneXpert MRSA Assay (FDA approved for NASAL specimens only), is one component of a comprehensive MRSA colonization surveillance program. It is not intended to diagnose MRSA infection nor to guide or monitor treatment for MRSA infections.   Troponin I     Status: None   Collection Time: 08/13/15  3:35 AM  Result Value Ref Range   Troponin I <0.03 <0.031 ng/mL    Comment:        NO INDICATION OF MYOCARDIAL INJURY.   CBC     Status: Abnormal   Collection Time: 08/13/15  3:35 AM  Result Value Ref Range   WBC 12.3 (H) 4.0 - 10.5 K/uL   RBC 4.59 3.87 - 5.11 MIL/uL   Hemoglobin 12.9 12.0 - 15.0 g/dL   HCT 38.3 36.0 - 46.0 %   MCV 83.4 78.0 - 100.0 fL   MCH 28.1 26.0 - 34.0 pg   MCHC 33.7 30.0 - 36.0 g/dL   RDW 19.7 (H) 11.5 - 15.5 %   Platelets 227 150 - 400 K/uL  Basic metabolic panel     Status: Abnormal   Collection Time: 08/13/15  3:35 AM  Result Value Ref Range   Sodium 144 135 - 145 mmol/L   Potassium 3.7 3.5 - 5.1 mmol/L   Chloride 110 101 - 111 mmol/L   CO2 23 22 - 32 mmol/L   Glucose, Bld 113 (H) 65 - 99 mg/dL   BUN 12 6 - 20 mg/dL   Creatinine, Ser 0.79 0.44 - 1.00 mg/dL   Calcium 7.4 (L) 8.9 - 10.3 mg/dL   GFR calc non Af Amer >60 >60 mL/min   GFR calc  Af Amer >60 >60 mL/min    Comment: (NOTE) The eGFR has been calculated using the CKD EPI equation. This calculation has not been validated in all clinical situations. eGFR's persistently <60 mL/min signify possible Chronic Kidney Disease.    Anion gap 11 5 - 15  Magnesium      Status: Abnormal   Collection Time: 08/13/15  3:35 AM  Result Value Ref Range   Magnesium 1.6 (L) 1.7 - 2.4 mg/dL  Phosphorus     Status: Abnormal   Collection Time: 08/13/15  3:35 AM  Result Value Ref Range   Phosphorus 5.9 (H) 2.5 - 4.6 mg/dL  Blood gas, arterial     Status: Abnormal   Collection Time: 08/13/15  5:12 AM  Result Value Ref Range   FIO2 0.40    Delivery systems VENTILATOR    Mode PRESSURE REGULATED VOLUME CONTROL    VT 600 mL   LHR 14 resp/min   Peep/cpap 5.0 cm H20   pH, Arterial 7.459 (H) 7.350 - 7.450   pCO2 arterial 31.6 (L) 35.0 - 45.0 mmHg   pO2, Arterial 101 (H) 80.0 - 100.0 mmHg   Bicarbonate 22.2 20.0 - 24.0 mEq/L   TCO2 19.7 0 - 100 mmol/L   Acid-base deficit 0.6 0.0 - 2.0 mmol/L   O2 Saturation 97.6 %   Patient temperature 98.1    Collection site LEFT RADIAL    Drawn by [id number]    Sample type ARTERIAL DRAW    Allens test (pass/fail) PASS PASS  Troponin I     Status: None   Collection Time: 08/13/15  9:26 AM  Result Value Ref Range   Troponin I <0.03 <0.031 ng/mL    Comment:        NO INDICATION OF MYOCARDIAL INJURY.   CBC     Status: Abnormal   Collection Time: 08/14/15  3:41 AM  Result Value Ref Range   WBC 10.4 4.0 - 10.5 K/uL   RBC 4.14 3.87 - 5.11 MIL/uL   Hemoglobin 11.7 (L) 12.0 - 15.0 g/dL   HCT 35.4 (L) 36.0 - 46.0 %   MCV 85.5 78.0 - 100.0 fL   MCH 28.3 26.0 - 34.0 pg   MCHC 33.1 30.0 - 36.0 g/dL   RDW 18.9 (H) 11.5 - 15.5 %   Platelets 166 150 - 400 K/uL  Basic metabolic panel     Status: Abnormal   Collection Time: 08/14/15  3:41 AM  Result Value Ref Range   Sodium 138 135 - 145 mmol/L   Potassium 2.9 (L) 3.5 - 5.1 mmol/L    Comment: RESULT REPEATED AND VERIFIED DELTA CHECK NOTED    Chloride 106 101 - 111 mmol/L   CO2 24 22 - 32 mmol/L   Glucose, Bld 103 (H) 65 - 99 mg/dL   BUN 11 6 - 20 mg/dL   Creatinine, Ser 0.73 0.44 - 1.00 mg/dL   Calcium 8.2 (L) 8.9 - 10.3 mg/dL   GFR calc non Af Amer >60 >60 mL/min   GFR calc  Af Amer >60 >60 mL/min    Comment: (NOTE) The eGFR has been calculated using the CKD EPI equation. This calculation has not been validated in all clinical situations. eGFR's persistently <60 mL/min signify possible Chronic Kidney Disease.    Anion gap 8 5 - 15  Magnesium     Status: None   Collection Time: 08/14/15  3:41 AM  Result Value Ref Range   Magnesium 1.9 1.7 - 2.4 mg/dL  Phosphorus  Status: None   Collection Time: 08/14/15  3:41 AM  Result Value Ref Range   Phosphorus 3.4 2.5 - 4.6 mg/dL  Hepatic function panel     Status: Abnormal   Collection Time: 08/14/15  3:41 AM  Result Value Ref Range   Total Protein 6.2 (L) 6.5 - 8.1 g/dL   Albumin 3.3 (L) 3.5 - 5.0 g/dL   AST 25 15 - 41 U/L   ALT 30 14 - 54 U/L   Alkaline Phosphatase 90 38 - 126 U/L   Total Bilirubin 1.4 (H) 0.3 - 1.2 mg/dL   Bilirubin, Direct 0.3 0.1 - 0.5 mg/dL   Indirect Bilirubin 1.1 (H) 0.3 - 0.9 mg/dL    Current Facility-Administered Medications  Medication Dose Route Frequency Provider Last Rate Last Dose  . 0.9 %  sodium chloride infusion  250 mL Intravenous PRN Jose Shirl Harris, MD 10 mL/hr at 08/14/15 0800 250 mL at 08/14/15 0800  . antiseptic oral rinse solution (CORINZ)  7 mL Mouth Rinse QID Jose Shirl Harris, MD   7 mL at 08/13/15 0346  . chlorhexidine gluconate (SAGE KIT) (PERIDEX) 0.12 % solution 15 mL  15 mL Mouth Rinse BID Jose Shirl Harris, MD   15 mL at 08/13/15 [phone]  . cloNIDine (CATAPRES - Dosed in mg/24 hr) patch 0.1 mg  0.1 mg Transdermal Weekly Anders Simmonds, MD   0.1 mg at 08/13/15 1649  . haloperidol lactate (HALDOL) injection 2 mg  2 mg Intravenous Q3H PRN Anders Simmonds, MD   2 mg at 08/14/15 0125  . heparin injection 5,000 Units  5,000 Units Subcutaneous [place], MD   5,000 Units at 08/14/15 [phone]  . hydrOXYzine (ATARAX/VISTARIL) tablet 25 mg  25 mg Oral Q6H PRN Ambrose Finland, MD   25 mg at 08/13/15 1447  . labetalol (NORMODYNE,TRANDATE)  injection 20 mg  20 mg Intravenous Q2H PRN Raylene Miyamoto, MD   20 mg at 08/14/15 0417  . loperamide (IMODIUM) capsule 2-4 mg  2-4 mg Oral PRN Ambrose Finland, MD      . LORazepam (ATIVAN) tablet 1 mg  1 mg Oral Q6H PRN Ambrose Finland, MD   1 mg at 08/13/15 1257  . multivitamin with minerals tablet 1 tablet  1 tablet Oral Daily Ambrose Finland, MD   1 tablet at 08/13/15 1854  . ondansetron (ZOFRAN-ODT) disintegrating tablet 4 mg  4 mg Oral Q6H PRN Ambrose Finland, MD      . pantoprazole (PROTONIX) injection 40 mg  40 mg Intravenous QHS Jose Shirl Harris, MD   40 mg at 08/13/15 2233  . potassium chloride SA (K-DUR,KLOR-CON) CR tablet 40 mEq  40 mEq Oral Q4H Kara Mead V, MD   40 mEq at 08/14/15 0654  . QUEtiapine (SEROQUEL) tablet 50 mg  50 mg Oral TID Ambrose Finland, MD   Stopped at 08/14/15 1761  . thiamine (VITAMIN B-1) tablet 100 mg  100 mg Oral Daily Ambrose Finland, MD        Musculoskeletal: Strength & Muscle Tone: increased Gait & Station: unable to stand Patient leans: N/A  Psychiatric Specialty Exam: ROS complained of SOB and alcohol withdrawal  No Fever-chills, No Headache, No changes with Vision or hearing, reports vertigo No problems swallowing food or Liquids, No Chest pain, Cough or Shortness of Breath, No Abdominal pain, No Nausea or Vommitting, Bowel movements are regular, No Blood in stool or Urine, No dysuria, No  new skin rashes or bruises, No new joints pains-aches,  No new weakness, tingling, numbness in any extremity, No recent weight gain or loss, No polyuria, polydypsia or polyphagia,  A full 10 point Review of Systems was done, except as stated above, all other Review of Systems were negative.  Blood pressure 171/106, pulse 101, temperature 99.3 F (37.4 C), temperature source Axillary, resp. rate 13, height _0  (1.753 m), weight 88.8 kg (195 lb 12.3 oz), SpO2 97 %, unknown if currently  breastfeeding.Body mass index is 28.9 kg/(m^2).  General Appearance: Bizarre, Disheveled and Guarded  Eye Contact::  Fair  Speech:  Clear and Coherent  Volume:  Increased  Mood:  Anxious, Depressed and Irritable  Affect:  Depressed, Inappropriate, Labile and Tearful  Thought Process:  Irrelevant, Loose and Tangential  Orientation:  Negative  Thought Content:  Rumination  Suicidal Thoughts:  reportedly she was taken phenargan at home prior to arrival. questionable suicide attempt.  Homicidal Thoughts:  No  Memory:  NA  Judgement:  Impaired  Insight:  Lacking  Psychomotor Activity:  Increased and Restlessness  Concentration:  Poor  Recall:  Poor  Fund of Knowledge:Poor  Language: Fair  Akathisia:  Negative  Handed:  Right  AIMS (if indicated):     Assets:  Communication Skills Desire for Improvement Housing Leisure Time Resilience Social Support Transportation  ADL's:  Impaired  Cognition: Impaired,  Moderate  Sleep:      Treatment Plan Summary: Patient has been suffering with post partum depression, polysubstance abuse and recent relapse of alcohol and presented with altered mental status and alcohol intoxication. Patient has improved her mental status since yesterday and now anxious about going home with her mother, father of the child and 83-11 weeks old infant and 32 years old.   Discontinue suicide sitter as patient can not contract for safety Continue Seroquel 50 mg PO TID for agitation and aggression. Monitor of alcohol withdrawal and start Ativan detox treatment She is on Fentanyl - monitor of opioid withdrawal  Appreciate psychiatric consultation and follow up as clinically required Please contact [phone] or [phone] if needs further assistance  Disposition: Patient will be referred to the outpatient medication management at [facility] for substance abuse treatment and also medication management at [facility]. Patient does not meet criteria for psychiatric  inpatient admission. Supportive therapy provided about ongoing stressors. Refer to IOP. Chemical dependency, Provide information regarding [facility] her behavioral [place] CD Or[place]., MD 08/14/2015 10:03 AM

## 2015-08-14 NOTE — Progress Notes (Signed)
RN received report from VF CorporationShanda RN. Pt arrived unit, alert and oriented, able to communicate needs, will continue with current plan of care.

## 2015-08-14 NOTE — Progress Notes (Signed)
PULMONARY / CRITICAL CARE MEDICINE   Name: RHANDI DESPAIN MRN: [id number] DOB: 1975-08-20    ADMISSION DATE:  08/12/2015 CONSULTATION DATE:  08/12/2015  REFERRING MD:  ED  CHIEF COMPLAINT:  Unresponsive. ETOH intoxication.   HISTORY OF PRESENT ILLNESS:   40 year old female admitted 5/20 s/p poly-substance overdose SUBJECTIVE:  Feels better  VITAL SIGNS: BP 171/106 mmHg  Pulse 101  Temp(Src) 99.3 F (37.4 C) (Axillary)  Resp 13  Ht  (1.753 m)  Wt 195 lb 12.3 oz (88.8 kg)  BMI 28.90 kg/m2  SpO2 97% Room air  HEMODYNAMICS:    VENTILATOR SETTINGS:    INTAKE / OUTPUT: I/O last 3 completed shifts: In: 3461.9 [I.V.:3461.9] Out: 4585 [Urine:4585]  PHYSICAL EXAMINATION: General:awake, oriented. No distress Neuro:  Moving all ext spontaneously. HEENT:  PERLA, (-) JVD  Cardiovascular:  Good s1/s2. (-) s3/m/r/g Lungs:  Clear no accessory use  Abdomen:  (+) BS, soft, NT, (-) masses/tenderness Musculoskeletal:  (-) gross abn.  Skin:  With previous suicide marks in B forearms. (-) edema/rash/clubbing/cyanosis.   LABS:  BMET  Recent Labs Lab 08/12/15 1749 08/13/15 0335 08/14/15 0341  NA 138 144 138  K 3.9 3.7 2.9*  CL 101 110 106  CO2 BUN CREATININE 0.62 0.79 0.73  GLUCOSE 166* 113* 103*   Electrolytes  Recent Labs Lab 08/12/15 1749 08/12/15 2118 08/13/15 0335 08/14/15 0341  CALCIUM 8.4*  --  7.4* 8.2*  MG  --  1.8 1.6* 1.9  PHOS  --  2.9 5.9* 3.4   CBC  Recent Labs Lab 08/12/15 2045 08/13/15 0335 08/14/15 0341  WBC 3.8* 12.3* 10.4  HGB 13.7 12.9 11.7*  HCT 40.2 38.3 35.4*  PLT 219 227 166   Coag's  Recent Labs Lab 08/12/15 2000  INR 1.11   Sepsis Markers  Recent Labs Lab 08/12/15 2118  LATICACIDVEN 2.7*  PROCALCITON <0.10   ABG  Recent Labs Lab 08/12/15 1957 08/13/15 0512  PHART 7.478* 7.459*  PCO2ART 29.9* 31.6*  PO2ART 108* 101*   Liver Enzymes  Recent Labs Lab 08/12/15 1749  08/14/15 0341  AST 40 25  ALT 46 30  ALKPHOS 84 90  BILITOT 0.5 1.4*  ALBUMIN 3.8 3.3*   Cardiac Enzymes  Recent Labs Lab 08/12/15 2118 08/13/15 0335 08/13/15 0926  TROPONINI <0.03 <0.03 <0.03   Glucose  Recent Labs Lab 08/12/15 1758 08/12/15 2112  GLUCAP 186* 134*   Imaging No results found. STUDIES:  Cranial CT scan (5/20) > (-) 2d echo 5/20 >   CULTURES: MRSA 5/20 >   ANTIBIOTICS: (-)  SIGNIFICANT EVENTS: 5/20  Admit for AMS/ETOH abuse. Intubated.   LINES/TUBES: (-)  DISCUSSION: 20F, with etoh abuse, chronic pain (on suboxone), with h/o suicide attempts before, with recent HTN in the  sys 170s with pregnancy, admitted for AMS associated with ETOH Abuse/Intoxication.  Intubated for airway protection. Now extubated. No distress. Will move out of ICU. Stable for transfer to Psych at this point. Will need to continue to watch for evidence of detox.   ASSESSMENT / PLAN:  AMS likely 2/2 ETOH abuse/withdrawal-->improved Pt with h/o suicide attempts Chronic pain on Suboxone Per discussion with family, frequent suicide attempts in the past. P:   Seroquel per psych  Suicide precautions. Suicide sitter. Will need BHC   Acute Hypoxemic Resp Failure 2/2 Unable to Protect Airway. No signs of infection.  -->resolved.  P:   Titrate O2 for sat of 88-92%.  Hypokalemia P: Replace   HTN related to recent pregnancy. EKG with old ASMI Troponin negative. P:  F/u 2DEcho. Labetalol (home med)  Possible ETOH Liver disease  P:   Regular diet. LFTs in AM.    FAMILY  - Updates: Brother Joen LauraBrian Evans has been updated of pt's condition bedside 5/21.  Also the father of her children was updated.  They were made aware of psych needs and will inform the rest of the family.  Patient is not to leave AMA until seen by psych, will IVC her if needs be.  - Inter-disciplinary family meet or Palliative Care meeting due by:  5/27  Simonne MartinetPeter E Babcock ACNP-BC [facility]  Pulmonary/Critical Care Pager # [phone] OR # [phone] if no answer  08/14/2015, 10:03 AM   Breathing better.  More calm.  No wheeze.  K 2.9, Hb 11.7.  Assessment/plan:  Altered mental status >> improved.  ?suicide attempt >> f/u with psych  ECG change >> Echo 5/22 >> mild LVH, EF 65 to 70%, grade 1 diastolic dysfx  ETOH >> monitor for withdrawal.  D/c Dr. Carmelina DaneJonnalagada.  Coralyn HellingVineet Carr Shartzer, MD [facility] Pulmonary/Critical Care 08/14/2015, 2:20 PM Pager:  [phone] After 3pm call: [phone]

## 2015-08-14 NOTE — Progress Notes (Signed)
[facility] FOR AM LAB REPLACEMENT ONLY  The patient does apply for the [facility] Electrolyte [facility] Protocol based on the criteria listed below:   1. Is GFR >/= 40 ml/min? Yes.    Patient's GFR today is >60 2. Is urine output >/= 0.5 ml/kg/hr for the last 6 hours? Yes.   Patient's UOP is 0.5 ml/kg/hr 3. Is BUN < 60 mg/dL? Yes.    Patient's BUN today is 11 4. Abnormal electrolyte(s): k 2.9 5. Ordered repletion with: per protocol 6. If a panic level lab has been reported, has the CCM MD in charge been notified? No..   Physician:    Markus DaftWHELAN, Nellie Chevalier A 08/14/2015 5:57 AM

## 2015-08-14 NOTE — Clinical Social Work Psych Assess (Signed)
Clinical Social Work Nature conservation officer  Clinical Social Worker:  Lia Hopping, LCSW Date/Time:  08/14/2015, 3:13 PM Referred By:  Care Management Date Referred:  08/14/15 Reason for Referral:  Substance Abuse   Presenting Symptoms/Problems  Presenting Symptoms/Problems(in person's/family's own words): Patient reported,"I made a mistake and do not feel like I have a problem." I want to be at home with my children". Patient reported she does not want to go to inpatient facility but feels she has no choice at this time.  "Earlie Server are trying to keep me away from kids." Patient presents with high ETOH Levels and admitted for detox, medical withdrawal.    Abuse/Neglect/Trauma History  Abuse/Neglect/Trauma History:  Denies History Abuse/Neglect/Trauma History Comments (indicate dates):  N/A   Psychiatric History  Psychiatric History:  Inpatient/Hospitalization  Psychiatric Medication:     Current Mental Health Hospitalizations/Previous Mental Health History: Unkown   Current Provider:   Place and Date:    Current Medications:     Previous Inpatient Admission/Date/Reason:  Reported she has been to several facilities in the past and does not want to go to another because she they do not help her.    Emotional Health/Current Symptoms  Suicide/Self Harm: Suicide Attempt in the Past (date/description), None Reported Suicide Attempt in Past (date/description):  Denied  Other Harmful Behavior (ex. homicidal ideation) (describe):  Denied   Psychotic/Dissociative Symptoms  Psychotic/Dissociative Symptoms: None Reported Other Psychotic/Dissociative Symptoms:  N/A   Attention/Behavioral Symptoms  Attention/Behavioral Symptoms: Inattentive Other Attention/Behavioral Symptoms:  Avoided wanting to talk about inpatient facility   Cognitive Impairment  Cognitive Impairment:  Within Normal Limits Other Cognitive Impairment:  N/A   Mood and Adjustment  Mood and  Adjustment:  Aggressive/Frustrated, Unable to Accurately Assess, Depression   Stress, Anxiety, Trauma, Any Recent Loss/Stressor  Stress, Anxiety, Trauma, Any Recent Loss/Stressor: Avoidance Anxiety (frequency):  None reported  Phobia (specify):  N/A  Compulsive Behavior (specify):  N/A  Obsessive Behavior (specify):  N/A  Other Stress, Anxiety, Trauma, Any Recent Loss/Stressor:  Denied Stressors. Constantly expressed concern to see her children.    Substance Abuse/Use  Substance Abuse/Use: Blackouts SBIRT Completed (please refer for detailed history): No Self-reported Substance Use (last use and frequency):  Yes. Did not report Frequncy  Urinary Drug Screen Completed: Yes Alcohol Level:   Environment/Housing/Living Arrangement  Environmental/Housing/Living Arrangement: Stable Housing, With Family Member Who is in the Home:  Self and two children   Emergency Contact:  Brother, Boyfriend    Pharmacist, community: Medicaid   Patient's Strengths and Goals  Patient's Strengths and Goals (patient's own words):  "I am capable of getting myself together, I am a good mother". Patient seemed motivated to get home to her children.    Clinical Social Worker's Interpretive Summary  Clinical Social Workers Interpretive Summary:  LCSWA met with patient. Patient guarded and refused to answer most questions from [facility]. Patient reported she felt lied to by the MD and psychiatrist. She reported they agreed to outpatient at [place]. Patient became frustrated about not seeing her children. Patient reported her family was "getting what they wanted".  LCSWA used motivation interviewing but patient shutdown and reported feeling to frustrated to talk.    Disposition  Disposition: Inpatient Referral Made [facility], [place])

## 2015-08-15 DIAGNOSIS — R401 Stupor: Secondary | ICD-10-CM

## 2015-08-15 LAB — BASIC METABOLIC PANEL
ANION GAP: 8 (ref 5–15)
BUN: 14 mg/dL (ref 6–20)
CALCIUM: 8.4 mg/dL — AB (ref 8.9–10.3)
CO2: 23 mmol/L (ref 22–32)
Chloride: 107 mmol/L (ref 101–111)
Creatinine, Ser: 0.71 mg/dL (ref 0.44–1.00)
GFR calc Af Amer: >60 mL/min (ref >60)
GLUCOSE: 102 mg/dL — AB (ref 65–99)
Potassium: 4.1 mmol/L (ref 3.5–5.1)
Sodium: 138 mmol/L (ref 135–145)

## 2015-08-15 MED ORDER — PROMETHAZINE HCL 25 MG PO TABS: 25.0000 mg | ORAL_TABLET | Freq: Four times a day (QID) | ORAL | Status: DC | PRN

## 2015-08-15 MED ORDER — QUETIAPINE FUMARATE 50 MG PO TABS
50.0000 mg | ORAL_TABLET | Freq: Three times a day (TID) | ORAL | Status: DC
Start: 2015-08-15 — End: 2015-10-22

## 2015-08-15 NOTE — Progress Notes (Signed)
Went over d/c instructions with patient.  She verbalized understanding.  Left hospital via w/c and personal vehicle. Levora AngelHannah V Sidda Humm, RN

## 2015-08-15 NOTE — Discharge Summary (Signed)
Physician Discharge Summary       Patient ID: Megan Edwards MRN: [id number] DOB/AGE: 10/10/1975 40 y.o.  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 Recent new mother (2 months prior). Started drinking about 2-3 weeks ago reportedly d/t increased stress. On day of admit: Significant other noted that he was over at her house, she was significantly intoxicated, was responding normal. Brother noted that he came by approximately 45 minutes later and patient seemed even more intoxicated, was sitting naked on the toilet. She was responding to his questioning, with slurred speech. EMS was called, she was very aggressive towards EMS when they tried to move her. Her mental status continued to deteriorate so she was intubated by EDP on arrival and PCCM asked to admit. She was admitted to the ICU. Treatment was supportive and included: mechanical ventilation, IV hydration, electrolyte replacement and telemetry monitoring. Her mental status improved to the point she was successfully extubated on 5/21. She was agitated initially but this subsided. Psych was consulted. Her seroquel dosing was increased. Bedside eval by psych cleared her for d/c to resume her medication titration in the out-patient setting w/ [facility] and [facility]. We will discharge her today.   Discharge Plan by active problems   Acute encephalopathy likely 2/2 ETOH abuse/withdrawal-->improved Pt with h/o suicide attempts Chronic pain on Suboxone Plan D/c to home. seroquel  tid F/u out-pt psych   Significant Hospital tests/ studies  Consults: Dr Elsie Saas   Discharge Exam: BP 135/87 mmHg  Pulse 91   Temp(Src) 97.4 F (36.3 C) (Oral)  Resp 18  Ht  (1.753 m)  Wt 195 lb 12.3 oz (88.8 kg)  BMI 28.90 kg/m2  SpO2 97%  Room air   General:awake, oriented. No distress Neuro: Moving all ext spontaneously. HEENT: PERLA, (-) JVD  Cardiovascular: Good s1/s2. (-) s3/m/r/g Lungs: Clear no accessory use  Abdomen: (+) BS, soft, NT, (-) masses/tenderness Musculoskeletal: (-) gross abn.  Skin: With previous suicide marks in B forearms. (-) edema/rash/clubbing/cyanosis.   Labs at discharge Lab Results  Component Value Date   CREATININE 0.71 08/15/2015   BUN 14 08/15/2015   NA 138 08/15/2015   K 4.1 08/15/2015   CL 107 08/15/2015   CO2 23 08/15/2015   Lab Results  Component Value Date   WBC 10.4 08/14/2015   HGB 11.7* 08/14/2015   HCT 35.4* 08/14/2015   MCV 85.5 08/14/2015   PLT 166 08/14/2015   Lab Results  Component Value Date   ALT 30 08/14/2015   AST 25 08/14/2015   ALKPHOS 90 08/14/2015   BILITOT 1.4* 08/14/2015   Lab Results  Component Value Date   INR 1.11 08/12/2015    Current radiology studies No results found.  Disposition:  01-Home or Self Care      Discharge Instructions    Diet - low sodium heart healthy    Complete by:  As directed      Increase activity slowly    Complete by:  As directed             Medication List    STOP taking these medications        docusate sodium 100 MG capsule  Commonly known as:  COLACE     ibuprofen 600 MG tablet  Commonly known as:  ADVIL,MOTRIN     magnesium hydroxide 400 MG/5ML suspension  Commonly known as:  MILK OF MAGNESIA     ondansetron 4 MG disintegrating tablet  Commonly known as:  ZOFRAN ODT     oxyCODONE-acetaminophen 5-325 MG tablet  Commonly known as:  PERCOCET/ROXICET     polyethylene glycol powder powder  Commonly known as:  GLYCOLAX/MIRALAX     sodium phosphate 7-19 GM/118ML Enem      TAKE these medications        buprenorphine 2 MG Subl SL tablet  Commonly known as:   SUBUTEX  Place 4 mg under the tongue daily.     buprenorphine 8 MG Subl SL tablet  Commonly known as:  SUBUTEX  Place 24 mg under the tongue daily.     labetalol 200 MG tablet  Commonly known as:  NORMODYNE  Take 1 tablet (200 mg total) by mouth 3 (three) times daily.     omeprazole 20 MG capsule  Commonly known as:  PRILOSEC  Take 1 capsule (20 mg total) by mouth 2 (two) times daily before a meal.     promethazine 25 MG tablet  Commonly known as:  PHENERGAN  Take 1 tablet (25 mg total) by mouth every 6 (six) hours as needed for nausea.     QUEtiapine 50 MG tablet  Commonly known as:  SEROQUEL  Take 1 tablet (50 mg total) by mouth 3 (three) times daily.     valACYclovir 1000 MG tablet  Commonly known as:  VALTREX  Take 1 tablet (1,000 mg total) by mouth as needed (with flare ups).         Discharged Condition: good  Physician Statement:   The Patient was personally examined, the discharge assessment and plan has been personally reviewed and I agree with ACNP Babcock's assessment and plan. > 30 minutes of time have been dedicated to discharge assessment, planning and discharge instructions.   Signed: Shelby Mattocksete E Babcock 08/15/2015, 11:08 AM  Coralyn HellingVineet Kypton Eltringham, MD [facility] Pulmonary/Critical Care 08/15/2015, 1:09 PM Pager:  [phone] After 3pm call: [phone]

## 2015-08-15 NOTE — Progress Notes (Signed)
Psych consult met with patient.  Disposition: Patient will be referred to the outpatient medication management at [facility] for substance abuse treatment and also medication management at [facility]. Patient does not meet criteria for psychiatric inpatient admission. Supportive therapy provided about ongoing stressors. LCSWA is signing off.    , LCSWA, MSW Clinical Social Worker 5E and Psychiatric Service Line [phone] 08/15/2015  11:42 AM 

## 2015-09-29 ENCOUNTER — Ambulatory Visit: Payer: Medicaid Other | Admitting: Obstetrics

## 2015-10-10 ENCOUNTER — Other Ambulatory Visit (INDEPENDENT_AMBULATORY_CARE_PROVIDER_SITE_OTHER): Payer: Medicaid Other

## 2015-10-10 ENCOUNTER — Encounter: Payer: Self-pay | Admitting: Obstetrics

## 2015-10-10 ENCOUNTER — Ambulatory Visit (INDEPENDENT_AMBULATORY_CARE_PROVIDER_SITE_OTHER): Payer: Medicaid Other | Admitting: Obstetrics

## 2015-10-10 VITALS — BP 151/95 | HR 95 | Temp 98.4°F | Wt 219.0 lb

## 2015-10-10 DIAGNOSIS — Z3202 Encounter for pregnancy test, result negative: Secondary | ICD-10-CM | POA: Diagnosis not present

## 2015-10-10 DIAGNOSIS — R140 Abdominal distension (gaseous): Secondary | ICD-10-CM

## 2015-10-10 DIAGNOSIS — N926 Irregular menstruation, unspecified: Secondary | ICD-10-CM

## 2015-10-10 DIAGNOSIS — N912 Amenorrhea, unspecified: Secondary | ICD-10-CM

## 2015-10-10 LAB — POCT URINE PREGNANCY: PREG TEST UR: NEGATIVE

## 2015-10-10 MED ORDER — MEDROXYPROGESTERONE ACETATE 10 MG PO TABS: 10.0000 mg | ORAL_TABLET | Freq: Every day | ORAL | Status: DC

## 2015-10-10 NOTE — Progress Notes (Signed)
Patient ID: Megan Edwards, female   DOB: [birth year], 40 y.o.   MRN: [id number]  Chief Complaint  Patient presents with  . Gynecologic Exam    Patient is experiencing abdominal bloating- enlargment since delivery. She has not had a cycle- and has not resumed intercourse. Patient was having normal monthly cycles before she got pregnant.    HPI [name] is a 40 y.o. female.  Had NSVD March 2017.  No period after delivery.  Has experienced abdominal bloating over past several months.  Has not had sexual intercourse since delivery.  Denies abdominal-pelvic pain.  HPI  Past Medical History  Diagnosis Date  . Depression   . Genital herpes 2000  . Infection     UTI  . Ovarian cyst   . Abnormal Pap smear   . SVT (supraventricular tachycardia) (HCC)   . Hypertension     Past Surgical History  Procedure Laterality Date  . Knee surgery    . Breast enhancement surgery    . Tonsillectomy    . Addenoidectomy    . Wisdom tooth extraction    . Breast surgery    . Knee surgery Left     Family History  Problem Relation Age of Onset  . Alcoholism    . Leukemia    . Cervical cancer    . Bone cancer    . Cirrhosis    . Diabetes Mellitus II    . Depression    . Hypertension    . Thyroid cancer      Social History Social History  Substance Use Topics  . Smoking status: Never Smoker   . Smokeless tobacco: Never Used  . Alcohol Use: 0.0 oz/week    0 Standard drinks or equivalent per week     Comment: 4 40's beer daily- not currently    Allergies  Allergen Reactions  . Penicillins Rash    Has patient had a PCN reaction causing immediate rash, facial/tongue/throat swelling, SOB or lightheadedness with hypotension: Yes Has patient had a PCN reaction causing severe rash involving mucus membranes or skin necrosis: No Has patient had a PCN reaction that required hospitalization No Has patient had a PCN reaction occurring within the last 10 years: No If all of the above  answers are "NO", then may proceed with Cephalosporin use.    Current Outpatient Prescriptions  Medication Sig Dispense Refill  . buprenorphine-naloxone (SUBOXONE) 2-0.5 MG SUBL SL tablet Place 1 tablet under the tongue 2 (two) times daily.    Marland Kitchen labetalol (NORMODYNE) 200 MG tablet Take 1 tablet (200 mg total) by mouth 3 (three) times daily. 90 tablet 5  . QUEtiapine (SEROQUEL) 50 MG tablet Take 1 tablet (50 mg total) by mouth 3 (three) times daily. 90 tablet 0  . valACYclovir (VALTREX) 1000 MG tablet Take 1 tablet (1,000 mg total) by mouth as needed (with flare ups). 30 tablet 3   No current facility-administered medications for this visit.    Review of Systems Review of Systems Constitutional: negative for fatigue and weight loss Respiratory: negative for cough and wheezing Cardiovascular: negative for chest pain, fatigue and palpitations Gastrointestinal: negative for abdominal pain and change in bowel habits.  Positive for abdominal bloating. Genitourinary:negative Integument/breast: negative for nipple discharge Musculoskeletal:negative for myalgias Neurological: negative for gait problems and tremors Behavioral/Psych: negative for abusive relationship, depression Endocrine: negative for temperature intolerance     Blood pressure 151/95, pulse 95, temperature 98.4 F (36.9 C), weight 219 lb (99.338 kg), not currently  breastfeeding.  Physical Exam Physical Exam General:   alert  Skin:   no rash or abnormalities  Lungs:   clear to auscultation bilaterally  Heart:   regular rate and rhythm, S1, S2 normal, no murmur, click, rub or gallop  Breasts:   normal without suspicious masses, skin or nipple changes or axillary nodes  Abdomen:  normal findings: no organomegaly, soft, non-tender and no hernia  Pelvis:  External genitalia: normal general appearance Urinary system: urethral meatus normal and bladder without fullness, nontender Vaginal: normal without tenderness, induration  or masses Cervix: normal appearance Adnexa: normal bimanual exam Uterus: anteverted and non-tender, normal size      Data Reviewed UPT  Assessment     Amenorrhea since NSVD in March 2017.   Abdominal bloating.     Plan    Wet prep and cultures done Ultrasound ordered:   Provera withdrawal Rx F/U 3 months  Orders Placed This Encounter  Procedures  . US Pelvis Complete    Standing Status: Future     Number of Occurrences: 1     Standing Expiration Date: 12/10/2016    Order Specific Question:  Reason for Exam (SYMPTOM  OR DIAGNOSIS REQUIRED)    Answer:  Amenorrhea.    Order Specific Question:  Preferred imaging location?    Answer:  Internal  . US Transvaginal Non-OB    Standing Status: Future     Number of Occurrences: 1     Standing Expiration Date: 12/10/2016    Order Specific Question:  Reason for Exam (SYMPTOM  OR DIAGNOSIS REQUIRED)    Answer:  Amenorrhea    Order Specific Question:  Preferred imaging location?    Answer:  Internal  . POCT urine pregnancy   Meds ordered this encounter  Medications  . buprenorphine-naloxone (SUBOXONE) 2-0.5 MG SUBL SL tablet    Sig: Place 1 tablet under the tongue 2 (two) times daily.

## 2015-10-14 LAB — NUSWAB VG+, CANDIDA 6SP
CANDIDA ALBICANS, NAA: NEGATIVE
CANDIDA KRUSEI, NAA: NEGATIVE
CANDIDA LUSITANIAE, NAA: NEGATIVE
CANDIDA PARAPSILOSIS, NAA: NEGATIVE
Candida glabrata, NAA: NEGATIVE
Candida tropicalis, NAA: NEGATIVE
Chlamydia trachomatis, NAA: NEGATIVE
Neisseria gonorrhoeae, NAA: NEGATIVE
Trich vag by NAA: NEGATIVE

## 2015-10-20 ENCOUNTER — Inpatient Hospital Stay (HOSPITAL_COMMUNITY)
Admission: EM | Admit: 2015-10-20 | Discharge: 2015-10-22 | DRG: 683 | Disposition: A | Discharge status: Home / Self Care | Payer: Medicaid Other | Attending: Internal Medicine | Admitting: Internal Medicine

## 2015-10-20 ENCOUNTER — Encounter (HOSPITAL_COMMUNITY): Payer: Self-pay | Admitting: Emergency Medicine

## 2015-10-20 DIAGNOSIS — I959 Hypotension, unspecified: Secondary | ICD-10-CM | POA: Diagnosis present

## 2015-10-20 DIAGNOSIS — A419 Sepsis, unspecified organism: Secondary | ICD-10-CM

## 2015-10-20 DIAGNOSIS — N179 Acute kidney failure, unspecified: Secondary | ICD-10-CM | POA: Diagnosis present

## 2015-10-20 DIAGNOSIS — E876 Hypokalemia: Secondary | ICD-10-CM | POA: Diagnosis present

## 2015-10-20 DIAGNOSIS — R55 Syncope and collapse: Secondary | ICD-10-CM

## 2015-10-20 DIAGNOSIS — Z6834 Body mass index (BMI) 34.0-34.9, adult: Secondary | ICD-10-CM

## 2015-10-20 DIAGNOSIS — O161 Unspecified maternal hypertension, first trimester: Secondary | ICD-10-CM

## 2015-10-20 DIAGNOSIS — F1920 Other psychoactive substance dependence, uncomplicated: Secondary | ICD-10-CM | POA: Diagnosis present

## 2015-10-20 DIAGNOSIS — R000 Tachycardia, unspecified: Secondary | ICD-10-CM | POA: Diagnosis present

## 2015-10-20 DIAGNOSIS — R6510 Systemic inflammatory response syndrome (SIRS) of non-infectious origin without acute organ dysfunction: Secondary | ICD-10-CM | POA: Diagnosis present

## 2015-10-20 DIAGNOSIS — F1120 Opioid dependence, uncomplicated: Secondary | ICD-10-CM | POA: Diagnosis present

## 2015-10-20 DIAGNOSIS — E860 Dehydration: Secondary | ICD-10-CM | POA: Diagnosis present

## 2015-10-20 DIAGNOSIS — F3162 Bipolar disorder, current episode mixed, moderate: Secondary | ICD-10-CM | POA: Diagnosis present

## 2015-10-20 DIAGNOSIS — G894 Chronic pain syndrome: Secondary | ICD-10-CM

## 2015-10-20 LAB — BASIC METABOLIC PANEL
ANION GAP: 12 (ref 5–15)
BUN: 21 mg/dL — ABNORMAL HIGH (ref 6–20)
CALCIUM: 8.7 mg/dL — AB (ref 8.9–10.3)
CHLORIDE: 95 mmol/L — AB (ref 101–111)
CO2: 26 mmol/L (ref 22–32)
Creatinine, Ser: 2.35 mg/dL — ABNORMAL HIGH (ref 0.44–1.00)
GFR, EST AFRICAN AMERICAN: 29 mL/min — AB (ref >60)
GFR, EST NON AFRICAN AMERICAN: 25 mL/min — AB (ref >60)
Glucose, Bld: 119 mg/dL — ABNORMAL HIGH (ref 65–99)
POTASSIUM: 2.8 mmol/L — AB (ref 3.5–5.1)
SODIUM: 133 mmol/L — AB (ref 135–145)

## 2015-10-20 LAB — CBC
HCT: 37.3 % (ref 36.0–46.0)
HEMOGLOBIN: 12.5 g/dL (ref 12.0–15.0)
MCH: 30.3 pg (ref 26.0–34.0)
MCHC: 33.5 g/dL (ref 30.0–36.0)
MCV: 90.3 fL (ref 78.0–100.0)
Platelets: 320 10*3/uL (ref 150–400)
RBC: 4.13 MIL/uL (ref 3.87–5.11)
RDW: 14 % (ref 11.5–15.5)
WBC: 12.4 10*3/uL — ABNORMAL HIGH (ref 4.0–10.5)

## 2015-10-20 LAB — URINALYSIS, ROUTINE W REFLEX MICROSCOPIC
Glucose, UA: NEGATIVE mg/dL
KETONES UR: NEGATIVE mg/dL
NITRITE: NEGATIVE
PROTEIN: 100 mg/dL — AB
Specific Gravity, Urine: 1.017 (ref 1.005–1.030)
pH: 5.5 (ref 5.0–8.0)

## 2015-10-20 LAB — HEPATIC FUNCTION PANEL
ALBUMIN: 4.1 g/dL (ref 3.5–5.0)
ALK PHOS: 72 U/L (ref 38–126)
ALT: 33 U/L (ref 14–54)
AST: 29 U/L (ref 15–41)
BILIRUBIN TOTAL: 0.9 mg/dL (ref 0.3–1.2)
Total Protein: 6.8 g/dL (ref 6.5–8.1)

## 2015-10-20 LAB — HCG, QUANTITATIVE, PREGNANCY

## 2015-10-20 LAB — CBG MONITORING, ED: GLUCOSE-CAPILLARY: 138 mg/dL — AB (ref 65–99)

## 2015-10-20 LAB — URINE MICROSCOPIC-ADD ON

## 2015-10-20 LAB — LIPASE, BLOOD: Lipase: 16 U/L (ref 11–51)

## 2015-10-20 MED ORDER — SODIUM CHLORIDE 0.9 % IV BOLUS (SEPSIS)
1000.0000 mL | Freq: Once | INTRAVENOUS | Status: AC
  Administered 2015-10-20: 1000 mL via INTRAVENOUS

## 2015-10-20 NOTE — ED Provider Notes (Signed)
Emergency Department Provider Note   I have reviewed the triage vital signs and the nursing notes.   HISTORY  Chief Complaint Loss of Consciousness and Hypotension   HPI Megan Edwards is a 40 y.o. female with PMH of depression, HTN, SVT presents to the emergency department for evaluation of syncope and hypotension. Patient states that she restarted her Seroquel 100 mg tabs today after being off due to pregnancy along with her Labetalol 200 mg tabs which she had been out of for two weeks. Patient states that she took both medications at approximately 6 PM. When she went to stand up out of her car she had a syncopal event she went to the ground. She is unsure if she hit her head on the ground. No vomiting since the event. She denies any fevers, chills, productive cough, dysuria. She denies other drug use or alcohol abuse. The patient also takes Suboxone and has been on a stable dose for several years. She took that medication this morning and did not take any additional was afternoon. She denies any associated chest pain, palpitations, difficult breathing, abdominal pain, fever, chills, dysuria. Does note that urine has been more dark recently.    Past Medical History:  Diagnosis Date  . Abnormal Pap smear   . Depression   . Genital herpes 2000  . Hypertension   . Infection    UTI  . Ovarian cyst   . SVT (supraventricular tachycardia) [facility])     Patient Active Problem List   Diagnosis Date Noted  . Acute respiratory failure with hypoxia (HCC)   . Altered mental status 08/13/2015  . Alcohol intoxication (HCC) 08/12/2015  . Acute hypoxemic respiratory failure (HCC)   . NSVD (normal spontaneous vaginal delivery) 06/13/2015  . Normal labor 06/11/2015  . History of postpartum hemorrhage, currently pregnant 05/11/2015  . Immediate postpartum hemorrhage, with delivery 11/28/2012  . Pregnancy induced hypertension 11/25/2012  . Elderly primigravida, antepartum 06/26/2012  .  Polysubstance (excluding opioids) dependence (HCC) 08/11/2011    Past Surgical History:  Procedure Laterality Date  . addenoidectomy    . BREAST ENHANCEMENT SURGERY    . BREAST SURGERY    . KNEE SURGERY    . KNEE SURGERY Left   . TONSILLECTOMY    . WISDOM TOOTH EXTRACTION      Current Outpatient Rx  . Order #: [id number] Class: Historical Med  . Order #: [id number] Class: Normal  . Order #: [id number] Class: Normal  . Order #: [id number] Class: Normal  . Order #: [id number] Class: Normal    Allergies Penicillins  Family History  Problem Relation Age of Onset  . Alcoholism    . Leukemia    . Cervical cancer    . Bone cancer    . Cirrhosis    . Diabetes Mellitus II    . Depression    . Hypertension    . Thyroid cancer      Social History Social History  Substance Use Topics  . Smoking status: Never Smoker  . Smokeless tobacco: Never Used  . Alcohol use 0.0 oz/week     Comment: 4 40's beer daily- not currently    Review of Systems  Constitutional: No fever/chills; positive syncope.  Eyes: No visual changes. ENT: No sore throat. Cardiovascular: Denies chest pain. Respiratory: Denies shortness of breath. Gastrointestinal: No abdominal pain. No nausea, no vomiting.  No diarrhea.  No constipation. Genitourinary: Negative for dysuria. Musculoskeletal: Negative for back pain. Skin: Negative for rash. Neurological: Negative for headaches, focal weakness  or numbness.  10-point ROS otherwise negative.  ____________________________________________   PHYSICAL EXAM:  VITAL SIGNS: ED Triage Vitals  Enc Vitals Group     BP 10/20/15 1937 (!) 58/47     Pulse Rate 10/20/15 1937 96     Resp 10/20/15 1937 22     Temp 10/20/15 1937 98.1 F (36.7 C)     Temp Source 10/20/15 1937 Oral     SpO2 10/20/15 1937 99 %     Pain Score 10/20/15 1925 8   Constitutional: Alert and oriented. Conversational but mild drowsiness.  Eyes: Conjunctivae are normal. Pupils are 3 mm and  reactive. EOMI. Head: Small abrasion to the right forehead. No laceration or hematoma.  Nose: No congestion/rhinnorhea. Mouth/Throat: Mucous membranes are moist.  Oropharynx non-erythematous. Neck: No stridor.  No meningeal signs.   Cardiovascular: Normal rate, regular rhythm. Good peripheral circulation. Grossly normal heart sounds.   Respiratory: Normal respiratory effort.  No retractions. Lungs CTAB. Gastrointestinal: Soft and nontender. No distention.  Musculoskeletal: No lower extremity tenderness nor edema. No gross deformities of extremities. Neurologic:  Normal speech and language. No gross focal neurologic deficits are appreciated.  Skin:  Skin is warm, dry and intact. No rash noted. Psychiatric: Mood and affect are normal. Speech and behavior are normal.  ____________________________________________   LABS (all labs ordered are listed, but only abnormal results are displayed)  Labs Reviewed  BASIC METABOLIC PANEL - Abnormal; Notable for the following:       Result Value   Sodium 133 (*)    Potassium 2.8 (*)    Chloride 95 (*)    Glucose, Bld 119 (*)    BUN 21 (*)    Creatinine, Ser 2.35 (*)    Calcium 8.7 (*)    GFR calc non Af Amer 25 (*)    GFR calc Af Amer 29 (*)    All other components within normal limits  CBC - Abnormal; Notable for the following:    WBC 12.4 (*)    All other components within normal limits  URINALYSIS, ROUTINE W REFLEX MICROSCOPIC (NOT AT [facility]) - Abnormal; Notable for the following:    APPearance CLOUDY (*)    Hgb urine dipstick LARGE (*)    Bilirubin Urine SMALL (*)    Protein, ur 100 (*)    Leukocytes, UA SMALL (*)    All other components within normal limits  HEPATIC FUNCTION PANEL - Abnormal; Notable for the following:    Bilirubin, Direct <0.1 (*)    All other components within normal limits  URINE MICROSCOPIC-ADD ON - Abnormal; Notable for the following:    Squamous Epithelial / LPF 6-30 (*)    Bacteria, UA FEW (*)    Casts  HYALINE CASTS (*)    Crystals CA OXALATE CRYSTALS (*)    All other components within normal limits  CBG MONITORING, ED - Abnormal; Notable for the following:    Glucose-Capillary 138 (*)    All other components within normal limits  HCG, QUANTITATIVE, PREGNANCY  LIPASE, BLOOD   ____________________________________________  EKG  Reviewed in MUSE. No STEMI.   ____________________________________________   PROCEDURES  Procedure(s) performed:   Procedures  CRITICAL CARE Performed by: Maia Plan Total critical care time: 50 minutes Critical care time was exclusive of separately billable procedures and treating other patients. Critical care was necessary to treat or prevent imminent or life-threatening deterioration. Critical care was time spent personally by me on the following activities: development of treatment plan with patient and/or  surrogate as well as nursing, discussions with consultants, evaluation of patient's response to treatment, examination of patient, obtaining history from patient or surrogate, ordering and performing treatments and interventions, ordering and review of laboratory studies, ordering and review of radiographic studies, pulse oximetry and re-evaluation of patient's condition.  Alona Bene, MD Emergency Medicine  ____________________________________________   INITIAL IMPRESSION / ASSESSMENT AND PLAN / ED COURSE  Pertinent labs & imaging results that were available during my care of the patient were reviewed by me and considered in my medical decision making (see chart for details).  Patient resents to the emergency department for evaluation of hypotension and syncope. EKG is unremarkable. I suspect that the patient's hypertension syncope secondary to medications taken together. The patient took labetalol and Seroquel which she has been off of for some time. The patient is not bradycardic. He has no signs or symptoms of infection to suggest severe  sepsis. She somewhat drowsy but is conversational despite her low blood pressures. We'll give additional IV fluid bolus, labs, and close monitoring. The patient has a very mild abrasion to her right forehead. No underlying hematoma or concern for skull fracture. Low suspicion for intracranial injury or bleed. Considered multiple possible etiologies for the patient's hypotension including dehydration, ingestions, illicit drug abuse, infection/septic shock, cardiogenic, and PE. Patient with no CP or SOB. Clinically combined medications for BP and sedation prior to syncope in the setting of 2-3 days of poor PO intake. EKG unremarkable.   11:24 PM Reassessed patient who is eating a sandwich, sitting upright, awake and alert but now with tachycardia and return of hypotension. She has a remarkable AKI and mild leukocytosis. Rechecked multiple times with similar low results and continued tachycardia. Patient mental status is normal. Clinically she appears extremely well. With persistent hypotension and tachycardia and presuming that the patient is dehydrated. I again questioned her regarding fever, chills, chest pain, or difficulty breathing. Will add on lactate and d-dimer. Could not perform CTA PE given creatine at this point. Will cover with abx with return of hypotension which was unexpected given clinical course. I will discuss admission with the hospitalist likely to stepdown given hypotension.   11:56 PM Spoke with hospitalist regarding admission. He will be down to evaluate the patient prior to placing orders.  ____________________________________________  FINAL CLINICAL IMPRESSION(S) / ED DIAGNOSES  Final diagnoses:  Syncope and collapse  Hypotension, unspecified hypotension type  AKI (acute kidney injury) (HCC)     MEDICATIONS GIVEN DURING THIS VISIT:  Medications  medroxyPROGESTERone (PROVERA) tablet 10 mg (not administered)  buprenorphine-naloxone (SUBOXONE) 2-0.5 MG per SL tablet 3 tablet  (not administered)  valACYclovir (VALTREX) tablet 1,000 mg (not administered)  QUEtiapine (SEROQUEL) tablet 100 mg (not administered)  heparin injection 5,000 Units (5,000 Units Subcutaneous Given 10/21/15 0214)  0.9 %  sodium chloride infusion ( Intravenous New Bag/Given 10/21/15 0634)  potassium chloride SA (K-DUR,KLOR-CON) CR tablet 40 mEq (40 mEq Oral Given 10/21/15 0827)  sodium chloride 0.9 % bolus 1,000 mL (0 mLs Intravenous Stopped 10/20/15 2216)  sodium chloride 0.9 % bolus 1,000 mL (1,000 mLs Intravenous Transfusing/Transfer 10/21/15 0220)  levofloxacin (LEVAQUIN) IVPB 750 mg (750 mg Intravenous Transfusing/Transfer 10/21/15 0220)  aztreonam (AZACTAM) 2 g in dextrose 5 % 50 mL IVPB (0 g Intravenous Stopped 10/21/15 0215)  vancomycin (VANCOCIN) 1,500 mg in sodium chloride 0.9 % 500 mL IVPB (1,500 mg Intravenous Given 10/21/15 0251)     NEW OUTPATIENT MEDICATIONS STARTED DURING THIS VISIT:  None   Note:  This  document was prepared using Conservation officer, historic buildings and may include unintentional dictation errors.  Alona Bene, MD Emergency Medicine   Maia Plan, MD 10/21/15 1019

## 2015-10-20 NOTE — ED Notes (Signed)
Pt appearing more alert and oriented.

## 2015-10-20 NOTE — ED Triage Notes (Signed)
Pt arrives by EMS, coming from [place]. Bystander noticed she was acting funny. Had syncopal episode upon standing. Told EMS she had just taken Seroquel and labetalol at 1800. EMS unable to get manual or automatic BP, no radial pulse. ECG shows [facility] for EMS. Hx of opiate abuse, but reports in recovery program at this time.

## 2015-10-21 ENCOUNTER — Inpatient Hospital Stay (HOSPITAL_COMMUNITY): Payer: Medicaid Other

## 2015-10-21 DIAGNOSIS — F1120 Opioid dependence, uncomplicated: Secondary | ICD-10-CM | POA: Diagnosis present

## 2015-10-21 DIAGNOSIS — A419 Sepsis, unspecified organism: Secondary | ICD-10-CM | POA: Diagnosis not present

## 2015-10-21 DIAGNOSIS — F3162 Bipolar disorder, current episode mixed, moderate: Secondary | ICD-10-CM | POA: Diagnosis present

## 2015-10-21 DIAGNOSIS — R6510 Systemic inflammatory response syndrome (SIRS) of non-infectious origin without acute organ dysfunction: Secondary | ICD-10-CM | POA: Diagnosis present

## 2015-10-21 DIAGNOSIS — I959 Hypotension, unspecified: Secondary | ICD-10-CM | POA: Diagnosis not present

## 2015-10-21 DIAGNOSIS — R6520 Severe sepsis without septic shock: Secondary | ICD-10-CM

## 2015-10-21 DIAGNOSIS — Z6834 Body mass index (BMI) 34.0-34.9, adult: Secondary | ICD-10-CM | POA: Diagnosis not present

## 2015-10-21 DIAGNOSIS — F1920 Other psychoactive substance dependence, uncomplicated: Secondary | ICD-10-CM | POA: Diagnosis not present

## 2015-10-21 DIAGNOSIS — R55 Syncope and collapse: Secondary | ICD-10-CM | POA: Diagnosis not present

## 2015-10-21 DIAGNOSIS — R000 Tachycardia, unspecified: Secondary | ICD-10-CM | POA: Diagnosis present

## 2015-10-21 DIAGNOSIS — E860 Dehydration: Secondary | ICD-10-CM | POA: Diagnosis present

## 2015-10-21 DIAGNOSIS — E876 Hypokalemia: Secondary | ICD-10-CM | POA: Diagnosis present

## 2015-10-21 DIAGNOSIS — N179 Acute kidney failure, unspecified: Principal | ICD-10-CM

## 2015-10-21 LAB — CBC
HEMATOCRIT: 35.6 % — AB (ref 36.0–46.0)
Hemoglobin: 11.6 g/dL — ABNORMAL LOW (ref 12.0–15.0)
MCH: 30.1 pg (ref 26.0–34.0)
MCHC: 32.6 g/dL (ref 30.0–36.0)
MCV: 92.5 fL (ref 78.0–100.0)
PLATELETS: 255 10*3/uL (ref 150–400)
RBC: 3.85 MIL/uL — ABNORMAL LOW (ref 3.87–5.11)
RDW: 14.2 % (ref 11.5–15.5)
WBC: 9.3 10*3/uL (ref 4.0–10.5)

## 2015-10-21 LAB — BASIC METABOLIC PANEL
ANION GAP: 10 (ref 5–15)
BUN: 20 mg/dL (ref 6–20)
CALCIUM: 8.3 mg/dL — AB (ref 8.9–10.3)
CO2: 24 mmol/L (ref 22–32)
Chloride: 101 mmol/L (ref 101–111)
Creatinine, Ser: 1.27 mg/dL — ABNORMAL HIGH (ref 0.44–1.00)
GFR calc Af Amer: >60 mL/min (ref >60)
GFR, EST NON AFRICAN AMERICAN: 52 mL/min — AB (ref >60)
GLUCOSE: 111 mg/dL — AB (ref 65–99)
Potassium: 2.8 mmol/L — ABNORMAL LOW (ref 3.5–5.1)
SODIUM: 135 mmol/L (ref 135–145)

## 2015-10-21 LAB — LACTIC ACID, PLASMA
LACTIC ACID, VENOUS: 2.9 mmol/L — AB (ref 0.5–1.9)
Lactic Acid, Venous: 4.2 mmol/L (ref 0.5–1.9)
Lactic Acid, Venous: 2.1 mmol/L (ref 0.5–1.9)
Lactic Acid, Venous: 1.9 mmol/L (ref 0.5–1.9)

## 2015-10-21 LAB — PROTIME-INR
INR: 1.07
Prothrombin Time: 13.9 seconds (ref 11.4–15.2)

## 2015-10-21 LAB — ETHANOL

## 2015-10-21 LAB — MAGNESIUM: Magnesium: 1.7 mg/dL (ref 1.7–2.4)

## 2015-10-21 LAB — D-DIMER, QUANTITATIVE (NOT AT ARMC)

## 2015-10-21 LAB — APTT: APTT: 28 s (ref 24–36)

## 2015-10-21 LAB — I-STAT CG4 LACTIC ACID, ED: Lactic Acid, Venous: 4.38 mmol/L (ref 0.5–1.9)

## 2015-10-21 LAB — PROCALCITONIN

## 2015-10-21 MED ORDER — MEDROXYPROGESTERONE ACETATE 10 MG PO TABS
10.0000 mg | ORAL_TABLET | Freq: Every day | ORAL | Status: DC
  Administered 2015-10-21 – 2015-10-22 (×2): 10 mg via ORAL
  Filled 2015-10-21 (×2): qty 1

## 2015-10-21 MED ORDER — TEMAZEPAM 15 MG PO CAPS: 15.0000 mg | ORAL_CAPSULE | Freq: Every evening | ORAL | Status: DC | PRN

## 2015-10-21 MED ORDER — DEXTROSE 5 % IV SOLN
2.0000 g | Freq: Once | INTRAVENOUS | Status: AC
  Administered 2015-10-21: 2 g via INTRAVENOUS
  Filled 2015-10-21: qty 2

## 2015-10-21 MED ORDER — HEPARIN SODIUM (PORCINE) 5000 UNIT/ML IJ SOLN
5000.0000 [IU] | Freq: Three times a day (TID) | INTRAMUSCULAR | Status: DC
  Administered 2015-10-21 (×3): 5000 [IU] via SUBCUTANEOUS
  Filled 2015-10-21 (×3): qty 1

## 2015-10-21 MED ORDER — VALACYCLOVIR HCL 500 MG PO TABS: 1000.0000 mg | ORAL_TABLET | ORAL | Status: DC | PRN

## 2015-10-21 MED ORDER — SODIUM CHLORIDE 0.9 % IV SOLN
INTRAVENOUS | Status: DC
  Administered 2015-10-21 (×4): via INTRAVENOUS

## 2015-10-21 MED ORDER — BUPRENORPHINE HCL-NALOXONE HCL 2-0.5 MG SL SUBL: 2.5000 | SUBLINGUAL_TABLET | Freq: Every day | SUBLINGUAL | Status: DC

## 2015-10-21 MED ORDER — POTASSIUM CHLORIDE CRYS ER 20 MEQ PO TBCR
40.0000 meq | EXTENDED_RELEASE_TABLET | ORAL | Status: AC
  Administered 2015-10-21 (×3): 40 meq via ORAL
  Filled 2015-10-21 (×3): qty 2

## 2015-10-21 MED ORDER — VANCOMYCIN HCL 10 G IV SOLR
1500.0000 mg | Freq: Once | INTRAVENOUS | Status: AC
  Administered 2015-10-21: 1500 mg via INTRAVENOUS
  Filled 2015-10-21: qty 1500

## 2015-10-21 MED ORDER — LEVOFLOXACIN IN D5W 750 MG/150ML IV SOLN: 750.0000 mg | Freq: Once | INTRAVENOUS | Status: DC

## 2015-10-21 MED ORDER — VANCOMYCIN HCL IN DEXTROSE 1-5 GM/200ML-% IV SOLN: 1000.0000 mg | Freq: Once | INTRAVENOUS | Status: DC

## 2015-10-21 MED ORDER — LEVOFLOXACIN IN D5W 750 MG/150ML IV SOLN
750.0000 mg | Freq: Once | INTRAVENOUS | Status: AC
  Administered 2015-10-21: 750 mg via INTRAVENOUS
  Filled 2015-10-21: qty 150

## 2015-10-21 MED ORDER — ACETAMINOPHEN 325 MG PO TABS
650.0000 mg | ORAL_TABLET | Freq: Four times a day (QID) | ORAL | Status: DC | PRN
  Administered 2015-10-22: 650 mg via ORAL
  Filled 2015-10-21: qty 2

## 2015-10-21 MED ORDER — VANCOMYCIN HCL 10 G IV SOLR
1500.0000 mg | Freq: Once | INTRAVENOUS | Status: DC
  Filled 2015-10-21: qty 1500

## 2015-10-21 MED ORDER — QUETIAPINE FUMARATE 25 MG PO TABS
100.0000 mg | ORAL_TABLET | Freq: Three times a day (TID) | ORAL | Status: DC
  Administered 2015-10-21 – 2015-10-22 (×4): 100 mg via ORAL
  Filled 2015-10-21 (×4): qty 4

## 2015-10-21 MED ORDER — AZTREONAM 2 G IJ SOLR: 2.0000 g | Freq: Once | INTRAMUSCULAR | Status: DC

## 2015-10-21 NOTE — ED Notes (Signed)
Pt's BP decr'd again to 71/44, while HR has incr'd to 120s. Notified MD, who stepped into room with me to re-assess the pt. Pt continues to be completely alert & oriented.

## 2015-10-21 NOTE — Progress Notes (Signed)
   10/21/15 0248  Vitals  Temp 97.6 F (36.4 C)  Temp Source Oral  BP 114/65  BP Location Left Arm  BP Method Automatic  Patient Position (if appropriate) Sitting  Pulse Rate (!) 110  Pulse Rate Source Dinamap  Resp 20  Oxygen Therapy  SpO2 97 %  O2 Device Room Air  Height and Weight  Height 5\' 7"  (1.702 m)  Weight 99.1 kg (218 lb 6.4 oz)  BSA (Calculated - sq m) 2.16 sq meters  BMI (Calculated) 34.3  Weight in (lb) to have BMI = 25 159.3    Patient arrived from the ED. A&Ox4. Patient oriented to the unit, room and call bell. CCMD called and verified. Pt denies any pain at this time. Will continue to monitor.

## 2015-10-21 NOTE — Progress Notes (Signed)
Patient seen and examined. Admitted after midnight secondary to SIRS and syncope. Patient reports poor intake and recent adjustments to her seroquel and reinitiating of antihypertensive agents. No fever, no chills, no CP, no SOB. Overall work up is demonstrating mainly orthostatic changes as reason for her syncope. No source of infection and dehydration process resolving with IVF's. Please refer to H&P written by Dr. Julian Reil for further info/details on admission.  Plan: -will continue IVF's -lactic acid trending down -continue electrolyte repletion  -will follow clinical response -patient will like to avoid any opiates, other than her chronic Suboxone (reports will ask family to bring it to the hospital)  Megan Edwards [phone]

## 2015-10-21 NOTE — ED Notes (Signed)
Notified Gardener MD as to critical lab value regarding lactic acid levels. Also discussed pt request that he "give her something to help her sleep". Gardener acknowledged critical labs and noted the pt request, declining d/t medications already on board.

## 2015-10-21 NOTE — Progress Notes (Signed)
Lab called to notify of a critical lab value of 2.1 for Lactic Acid. This number is trending down from 2.9 previously.

## 2015-10-21 NOTE — H&P (Signed)
History and Physical    Megan Edwards:[id number] DOB: June 21, 1975 DOA: 10/20/2015   PCP: Ambrose Finland, NP Chief Complaint:  Chief Complaint  Patient presents with  . Loss of Consciousness  . Hypotension    HPI: Megan Edwards is a 40 y.o. female with medical history significant of polysubstance abuse in past, on suboxone, HTN.  Patient presents to the ED for evaluation of syncope and hypotension.  Patient restarted her seroquel  tabs today after being off of these due to pregnancy as well as her labetalol  tabs which she has been out of for the past 2 weeks.  Took both meds at about 6 pm.    ED Course: Patient initially hypotensive and tachycardic, this improved initially after 2L IVF but then she had return of tachycardia and hypotension again.  Back on IVF and it is improving somewhat once more with 3rd and 4th liters.  Review of Systems: As per HPI otherwise 10 point review of systems negative.    Past Medical History:  Diagnosis Date  . Abnormal Pap smear   . Depression   . Genital herpes 2000  . Hypertension   . Infection    UTI  . Ovarian cyst   . SVT (supraventricular tachycardia) (HCC)     Past Surgical History:  Procedure Laterality Date  . addenoidectomy    . BREAST ENHANCEMENT SURGERY    . BREAST SURGERY    . KNEE SURGERY    . KNEE SURGERY Left   . TONSILLECTOMY    . WISDOM TOOTH EXTRACTION       reports that she has never smoked. She has never used smokeless tobacco. She reports that she drinks alcohol. She reports that she does not use drugs.  Allergies  Allergen Reactions  . Penicillins Rash    Has patient had a PCN reaction causing immediate rash, facial/tongue/throat swelling, SOB or lightheadedness with hypotension: Yes Has patient had a PCN reaction causing severe rash involving mucus membranes or skin necrosis: No Has patient had a PCN reaction that required hospitalization No Has patient had a PCN reaction occurring within  the last 10 years: No If all of the above answers are "NO", then may proceed with Cephalosporin use.    Family History  Problem Relation Age of Onset  . Alcoholism    . Leukemia    . Cervical cancer    . Bone cancer    . Cirrhosis    . Diabetes Mellitus II    . Depression    . Hypertension    . Thyroid cancer        Prior to Admission medications   Medication Sig Start Date End Date Taking? Authorizing Provider  BIOTIN PO Take 1 tablet by mouth daily.   Yes Historical Provider, MD  buprenorphine-naloxone (SUBOXONE) 2-0.5 MG SUBL SL tablet Place 2.5 tablets under the tongue daily.    Yes Historical Provider, MD  labetalol (NORMODYNE) 200 MG tablet Take 1 tablet (200 mg total) by mouth 3 (three) times daily. 12/19/14  Yes Brock Bad, MD  medroxyPROGESTERone (PROVERA) 10 MG tablet Take 1 tablet (10 mg total) by mouth daily. 10/10/15  Yes Brock Bad, MD  Prenatal Vit-Fe Fumarate-FA (PRENATAL MULTIVITAMIN) TABS tablet Take 1 tablet by mouth daily at 12 noon.   Yes Historical Provider, MD  QUEtiapine (SEROQUEL) 50 MG tablet Take 1 tablet (50 mg total) by mouth 3 (three) times daily. Patient taking differently: Take 100 mg by mouth 3 (three)  times daily.  08/15/15  Yes Simonne Martinet, NP  valACYclovir (VALTREX) 1000 MG tablet Take 1 tablet (1,000 mg total) by mouth as needed (with flare ups). 12/18/14  Yes Dorathy Kinsman, CNM    Physical Exam: Vitals:   10/20/15 2319 10/20/15 2345 10/21/15 0000 10/21/15 0016  BP: (!) 81/50 (!) 92/46 91/60 105/63  Pulse: (!) 121 110 107 104  Resp: 19 15 11 18   Temp:      TempSrc:      SpO2: 99% 100% 100% 100%      Constitutional: NAD, calm, comfortable Eyes: PERRL, lids and conjunctivae normal ENMT: Mucous membranes are moist. Posterior pharynx clear of any exudate or lesions.Normal dentition.  Neck: normal, supple, no masses, no thyromegaly Respiratory: clear to auscultation bilaterally, no wheezing, no crackles. Normal respiratory  effort. No accessory muscle use.  Cardiovascular: Regular rate and rhythm, no murmurs / rubs / gallops. No extremity edema. 2+ pedal pulses. No carotid bruits.  Abdomen: no tenderness, no masses palpated. No hepatosplenomegaly. Bowel sounds positive.  Musculoskeletal: no clubbing / cyanosis. No joint deformity upper and lower extremities. Good ROM, no contractures. Normal muscle tone.  Skin: no rashes, lesions, ulcers. No induration Neurologic: CN 2-12 grossly intact. Sensation intact, DTR normal. Strength 5/5 in all 4.  Psychiatric: Normal judgment and insight. Alert and oriented x 3. Normal mood.    Labs on Admission: I have personally reviewed following labs and imaging studies  CBC:  Recent Labs Lab 10/20/15 2000  WBC 12.4*  HGB 12.5  HCT 37.3  MCV 90.3  PLT 320   Basic Metabolic Panel:  Recent Labs Lab 10/20/15 2000  NA 133*  K 2.8*  CL 95*  CO2 26  GLUCOSE 119*  BUN 21*  CREATININE 2.35*  CALCIUM 8.7*   GFR: Estimated Creatinine Clearance: 40.3 mL/min (by C-G formula based on SCr of 2.35 mg/dL). Liver Function Tests:  Recent Labs Lab 10/20/15 2000  AST 29  ALT 33  ALKPHOS 72  BILITOT 0.9  PROT 6.8  ALBUMIN 4.1    Recent Labs Lab 10/20/15 2000  LIPASE 16   No results for input(s): AMMONIA in the last 168 hours. Coagulation Profile: No results for input(s): INR, PROTIME in the last 168 hours. Cardiac Enzymes: No results for input(s): CKTOTAL, CKMB, CKMBINDEX, TROPONINI in the last 168 hours. BNP (last 3 results) No results for input(s): PROBNP in the last 8760 hours. HbA1C: No results for input(s): HGBA1C in the last 72 hours. CBG:  Recent Labs Lab 10/20/15 2046  GLUCAP 138*   Lipid Profile: No results for input(s): CHOL, HDL, LDLCALC, TRIG, CHOLHDL, LDLDIRECT in the last 72 hours. Thyroid Function Tests: No results for input(s): TSH, T4TOTAL, FREET4, T3FREE, THYROIDAB in the last 72 hours. Anemia Panel: No results for input(s):  VITAMINB12, FOLATE, FERRITIN, TIBC, IRON, RETICCTPCT in the last 72 hours. Urine analysis:    Component Value Date/Time   COLORURINE YELLOW 10/20/2015 2125   APPEARANCEUR CLOUDY (A) 10/20/2015 2125   LABSPEC 1.017 10/20/2015 2125   PHURINE 5.5 10/20/2015 2125   GLUCOSEU NEGATIVE 10/20/2015 2125   HGBUR LARGE (A) 10/20/2015 2125   BILIRUBINUR SMALL (A) 10/20/2015 2125   BILIRUBINUR negative 06/01/2015 1729   KETONESUR NEGATIVE 10/20/2015 2125   PROTEINUR 100 (A) 10/20/2015 2125   UROBILINOGEN negative 06/01/2015 1729   UROBILINOGEN 0.2 12/18/2014 0827   NITRITE NEGATIVE 10/20/2015 2125   LEUKOCYTESUR SMALL (A) 10/20/2015 2125   Sepsis Labs: @LABRCNTIP (procalcitonin:4,lacticidven:4) )No results found for this or any previous visit (  from the past 240 hour(s)).   Radiological Exams on Admission: No results found.  EKG: Independently reviewed.  Assessment/Plan Principal Problem:   SIRS with acute organ dysfunction due to infectious process [facility]) Active Problems:   Polysubstance (including opioids) dependence with physiol dependence (HCC)   AKI (acute kidney injury) (HCC)   Hypotension    Hypotension, tachycardia -  Got one dose of aztreonam, levaquin, vanc in ED  Will hold off on further ABx pending BCx, CXR, procalcitonin.  Continue IVF as she is improving on this  Repeat BMP and CBC in AM  Hold labetalol  No obvious source of infection  Alternatively this could be due to dehydration which patient endorses over the past couple of days as well.  UDS and EtOH levels pending  AKI - due to hypotension, giving IVF and repeat BMP in AM  Opiate dependence - continue suboxone   DVT prophylaxis: Heparin [place] Code Status: Full Family Communication: Family at bedside Consults called: None Admission status: Admit to inpatient   Hillary Bow DO [facility] Pager [phone] from 7PM-7AM  If 7AM-7PM, please contact the day physician for the  patient [email or web address] Password [facility]  10/21/2015, 12:34 AM

## 2015-10-22 DIAGNOSIS — F3162 Bipolar disorder, current episode mixed, moderate: Secondary | ICD-10-CM

## 2015-10-22 DIAGNOSIS — G894 Chronic pain syndrome: Secondary | ICD-10-CM

## 2015-10-22 DIAGNOSIS — R55 Syncope and collapse: Secondary | ICD-10-CM

## 2015-10-22 LAB — BASIC METABOLIC PANEL
Anion gap: 3 — ABNORMAL LOW (ref 5–15)
BUN: 11 mg/dL (ref 6–20)
CHLORIDE: 113 mmol/L — AB (ref 101–111)
CO2: 24 mmol/L (ref 22–32)
CREATININE: 0.82 mg/dL (ref 0.44–1.00)
Calcium: 8.4 mg/dL — ABNORMAL LOW (ref 8.9–10.3)
GFR calc Af Amer: >60 mL/min (ref >60)
GFR calc non Af Amer: >60 mL/min (ref >60)
Glucose, Bld: 104 mg/dL — ABNORMAL HIGH (ref 65–99)
Potassium: 4.8 mmol/L (ref 3.5–5.1)
Sodium: 140 mmol/L (ref 135–145)

## 2015-10-22 LAB — MAGNESIUM: Magnesium: 1.7 mg/dL (ref 1.7–2.4)

## 2015-10-22 MED ORDER — QUETIAPINE FUMARATE 100 MG PO TABS: 100.0000 mg | ORAL_TABLET | Freq: Three times a day (TID) | ORAL | Status: DC

## 2015-10-22 MED ORDER — LABETALOL HCL 100 MG PO TABS: 100.0000 mg | ORAL_TABLET | Freq: Two times a day (BID) | ORAL | 1 refills | Status: DC

## 2015-10-22 NOTE — Progress Notes (Signed)
Patient discharged home with her brother. IV was dc'd and was intact. Medications and discharge instructions were educated on and she stated that she understood.

## 2015-10-22 NOTE — Discharge Summary (Signed)
Physician Discharge Summary  Megan Edwards ZOX:[id number] DOB: 29-Jun-1975 DOA: 10/20/2015  PCP: Ambrose Finland, NP  Admit date: 10/20/2015 Discharge date: 10/22/2015  Time spent: 35 minutes  Recommendations for Outpatient Follow-up:  1. Repeat BMET to follow electrolytes and renal function  2. Reassess BP and adjust antihypertensive regimen as needed   Discharge Diagnoses:  Principal Problem:   SIRS with acute organ dysfunction due to infectious process [facility]) Active Problems:   Polysubstance (including opioids) dependence with physiol dependence (HCC)   AKI (acute kidney injury) (HCC)   Arterial hypotension   Syncope and collapse   Chronic pain syndrome   Morbid obesity due to excess calories (HCC)   Moderate mixed bipolar I disorder (HCC)   Discharge Condition: stable and improved. Discharge home with instructions to follow up with PCP in 10 days.  Diet recommendation: heart healthy and low calorie diet   Filed Weights   10/21/15 0248  Weight: 99.1 kg (218 lb 6.4 oz)    History of present illness:  As per H&P written by Dr. Julian Reil on 10/20/15 40 y.o. female with medical history significant of polysubstance abuse in past, on suboxone now, HTN and bipolar disorder.  Patient presents to the ED for evaluation of syncope and hypotension. Patient with adjustment on her seroquel dose to  tabs TID and after being off of these due to pregnancy as well as her labetalol  tabs which she has been out of for the past 2 weeks.    Hospital Course:  1-SIRS: no source of infection appreciated -sirs features resolved with IVF's resuscitation  -non-toxic appearance  -patient instructed to maintain proper hydration   2-hypertension: presented with hypotension and syncope  -most likely due to high dose of labetalol at resumption of her meds along with Seroquel with potential orthostatic reaction -at discharge BP stable and rising -dose adjusted to 100 mg BID and ask the patient  to take at least 40 minutes apart from seroquel -patient advise to follow low sodium diet -will follow up with PCP in 10 days  3-bipolar disorder  -will continue seroquel and outpatient follow up at [facility] -no SI or hallucinations currently  4-obesity -Body mass index is 34.21 kg/m. -low calorie diet and exercise recommended   5-AKI: -due to hypotension and dehydration -resolved with IVF's -Cr back to normal at discharge -advise to maintain good hydration   6-hypokalemia: -repleted and WNL at discharge  7-opiate dependence and chronic pain: -continue Suboxone and treatment at pain clinic   Procedures:  See below for x-ray reports   Consultations:  None   Discharge Exam: Vitals:   10/21/15 1317 10/22/15 0454  BP: 135/66 (!) 115/59  Pulse: (!) 105 85  Resp: 18 16  Temp: 97.5 F (36.4 C) 97.7 F (36.5 C)    General: afebrile, no CP, no SOB, no coughing, no abdominal pain  Cardiovascular: S1 and S2, no rubs or gallops Respiratory: CTA bilaterally Abd: soft, NT, ND, positive BS Extremities: no cyanosis or clubbing; mild edema after fluid resuscitation appreciated.  Discharge Instructions   Discharge Instructions    Diet - low sodium heart healthy    Complete by:  As directed   Discharge instructions    Complete by:  As directed   Keep yourself well hydrated Take medications as prescribed Please follow low sodium diet (less than 3 gram daily) Arrange follow up with PCP in 10 days     Current Discharge Medication List    CONTINUE these medications which have CHANGED  Details  labetalol (NORMODYNE) 100 MG tablet Take 1 tablet (100 mg total) by mouth 2 (two) times daily. Qty: 60 tablet, Refills: 1   Associated Diagnoses: HTN complicating peripregnancy, antepartum, first trimester    QUEtiapine (SEROQUEL) 100 MG tablet Take 1 tablet (100 mg total) by mouth 3 (three) times daily.      CONTINUE these medications which have NOT CHANGED   Details  BIOTIN  PO Take 1 tablet by mouth daily.    buprenorphine-naloxone (SUBOXONE) 2-0.5 MG SUBL SL tablet Place 2.5 tablets under the tongue daily.     medroxyPROGESTERone (PROVERA) 10 MG tablet Take 1 tablet (10 mg total) by mouth daily. Qty: 10 tablet, Refills: 2   Associated Diagnoses: Amenorrhea    Prenatal Vit-Fe Fumarate-FA (PRENATAL MULTIVITAMIN) TABS tablet Take 1 tablet by mouth daily at 12 noon.    valACYclovir (VALTREX) 1000 MG tablet Take 1 tablet (1,000 mg total) by mouth as needed (with flare ups). Qty: 30 tablet, Refills: 3   Associated Diagnoses: Vaginal bleeding in pregnancy, first trimester; Nausea and vomiting during pregnancy prior to [redacted] weeks gestation       Allergies  Allergen Reactions  . Penicillins Rash    Has patient had a PCN reaction causing immediate rash, facial/tongue/throat swelling, SOB or lightheadedness with hypotension: Yes Has patient had a PCN reaction causing severe rash involving mucus membranes or skin necrosis: No Has patient had a PCN reaction that required hospitalization No Has patient had a PCN reaction occurring within the last 10 years: No If all of the above answers are "NO", then may proceed with Cephalosporin use.   Follow-up Information    Ambrose Finland, NP Follow up in 10 day(s).   Specialty:  Internal Medicine          The results of significant diagnostics from this hospitalization (including imaging, microbiology, ancillary and laboratory) are listed below for reference.    Significant Diagnostic Studies: Dg Chest Port 1 View  Result Date: 10/21/2015 CLINICAL DATA:  40 year old female with sepsis EXAM: PORTABLE CHEST 1 VIEW COMPARISON:  Chest radiograph dated 08/13/2015 FINDINGS: The heart size and mediastinal contours are within normal limits. Both lungs are clear. The visualized skeletal structures are unremarkable. IMPRESSION: No active disease. Electronically Signed   By: Elgie Collard M.D.   On: 10/21/2015  00:51   Microbiology: Recent Results (from the past 240 hour(s))  Culture, blood (x 2)     Status: None (Preliminary result)   Collection Time: 10/21/15 12:58 AM  Result Value Ref Range Status   Specimen Description BLOOD RIGHT HAND  Final   Special Requests BOTTLES DRAWN AEROBIC AND ANAEROBIC  Final   Culture NO GROWTH 1 DAY  Final   Report Status PENDING  Incomplete  Culture, blood (x 2)     Status: None (Preliminary result)   Collection Time: 10/21/15  1:12 AM  Result Value Ref Range Status   Specimen Description BLOOD LEFT HAND  Final   Special Requests BOTTLES DRAWN AEROBIC AND ANAEROBIC  Final   Culture NO GROWTH 1 DAY  Final   Report Status PENDING  Incomplete     Labs: Basic Metabolic Panel:  Recent Labs Lab 10/20/15 2000 10/21/15 0327 10/21/15 0426 10/22/15 0316  NA 133* 135  --  140  K 2.8* 2.8*  --  4.8  CL 95* 101  --  113*  CO2 26 24  --  24  GLUCOSE 119* 111*  --  104*  BUN 21* 20  --  11  CREATININE 2.35* 1.27*  --  0.82  CALCIUM 8.7* 8.3*  --  8.4*  MG  --   --  1.7 1.7   Liver Function Tests:  Recent Labs Lab 10/20/15 2000  AST 29  ALT 33  ALKPHOS 72  BILITOT 0.9  PROT 6.8  ALBUMIN 4.1    Recent Labs Lab 10/20/15 2000  LIPASE 16   CBC:  Recent Labs Lab 10/20/15 2000 10/21/15 0327  WBC 12.4* 9.3  HGB 12.5 11.6*  HCT 37.3 35.6*  MCV 90.3 92.5  PLT 320 255   CBG:  Recent Labs Lab 10/20/15 2046  GLUCAP 138*   Signed:  Vassie Loll MD.  [facility] 10/22/2015, 1:57 PM

## 2015-10-26 LAB — CULTURE, BLOOD (ROUTINE X 2)
CULTURE: NO GROWTH
CULTURE: NO GROWTH

## 2015-11-17 ENCOUNTER — Other Ambulatory Visit: Payer: Self-pay | Admitting: Obstetrics

## 2015-11-17 DIAGNOSIS — N912 Amenorrhea, unspecified: Secondary | ICD-10-CM

## 2015-12-14 ENCOUNTER — Encounter (HOSPITAL_COMMUNITY): Payer: Self-pay | Admitting: Emergency Medicine

## 2015-12-14 ENCOUNTER — Emergency Department (HOSPITAL_COMMUNITY)
Admission: EM | Admit: 2015-12-14 | Discharge: 2015-12-14 | Disposition: A | Discharge status: Home / Self Care | Payer: Medicaid Other | Attending: Emergency Medicine | Admitting: Emergency Medicine

## 2015-12-14 ENCOUNTER — Emergency Department (HOSPITAL_COMMUNITY): Payer: Medicaid Other

## 2015-12-14 DIAGNOSIS — J029 Acute pharyngitis, unspecified: Secondary | ICD-10-CM | POA: Diagnosis not present

## 2015-12-14 DIAGNOSIS — M542 Cervicalgia: Secondary | ICD-10-CM | POA: Diagnosis not present

## 2015-12-14 DIAGNOSIS — W57XXXA Bitten or stung by nonvenomous insect and other nonvenomous arthropods, initial encounter: Secondary | ICD-10-CM | POA: Diagnosis not present

## 2015-12-14 DIAGNOSIS — S70361A Insect bite (nonvenomous), right thigh, initial encounter: Secondary | ICD-10-CM | POA: Diagnosis present

## 2015-12-14 DIAGNOSIS — Y939 Activity, unspecified: Secondary | ICD-10-CM | POA: Diagnosis not present

## 2015-12-14 DIAGNOSIS — I10 Essential (primary) hypertension: Secondary | ICD-10-CM | POA: Diagnosis not present

## 2015-12-14 DIAGNOSIS — R519 Headache, unspecified: Secondary | ICD-10-CM

## 2015-12-14 DIAGNOSIS — R51 Headache: Secondary | ICD-10-CM | POA: Insufficient documentation

## 2015-12-14 DIAGNOSIS — Y929 Unspecified place or not applicable: Secondary | ICD-10-CM | POA: Diagnosis not present

## 2015-12-14 DIAGNOSIS — L03115 Cellulitis of right lower limb: Secondary | ICD-10-CM | POA: Insufficient documentation

## 2015-12-14 DIAGNOSIS — Y999 Unspecified external cause status: Secondary | ICD-10-CM | POA: Insufficient documentation

## 2015-12-14 LAB — I-STAT BETA HCG BLOOD, ED (MC, WL, AP ONLY): I-stat hCG, quantitative: <5 m[IU]/mL (ref <5)

## 2015-12-14 LAB — BASIC METABOLIC PANEL
ANION GAP: 9 (ref 5–15)
BUN: 11 mg/dL (ref 6–20)
CALCIUM: 9.2 mg/dL (ref 8.9–10.3)
CO2: 28 mmol/L (ref 22–32)
Chloride: 101 mmol/L (ref 101–111)
Creatinine, Ser: 0.78 mg/dL (ref 0.44–1.00)
GLUCOSE: 111 mg/dL — AB (ref 65–99)
Potassium: 3.8 mmol/L (ref 3.5–5.1)
SODIUM: 138 mmol/L (ref 135–145)

## 2015-12-14 LAB — CBC WITH DIFFERENTIAL/PLATELET
BASOS ABS: 0 10*3/uL (ref 0.0–0.1)
BASOS PCT: 0 %
Eosinophils Absolute: 0.3 10*3/uL (ref 0.0–0.7)
Eosinophils Relative: 3 %
HEMATOCRIT: 39 % (ref 36.0–46.0)
HEMOGLOBIN: 12.7 g/dL (ref 12.0–15.0)
Lymphocytes Relative: 24 %
Lymphs Abs: 2.3 10*3/uL (ref 0.7–4.0)
MCH: 28.8 pg (ref 26.0–34.0)
MCHC: 32.6 g/dL (ref 30.0–36.0)
MCV: 88.4 fL (ref 78.0–100.0)
MONO ABS: 0.5 10*3/uL (ref 0.1–1.0)
Monocytes Relative: 5 %
NEUTROS ABS: 6.5 10*3/uL (ref 1.7–7.7)
NEUTROS PCT: 68 %
Platelets: 313 10*3/uL (ref 150–400)
RBC: 4.41 MIL/uL (ref 3.87–5.11)
RDW: 12.6 % (ref 11.5–15.5)
WBC: 9.6 10*3/uL (ref 4.0–10.5)

## 2015-12-14 MED ORDER — KETOROLAC TROMETHAMINE 30 MG/ML IJ SOLN
30.0000 mg | Freq: Once | INTRAMUSCULAR | Status: AC
  Administered 2015-12-14: 30 mg via INTRAVENOUS
  Filled 2015-12-14: qty 1

## 2015-12-14 MED ORDER — ONDANSETRON 4 MG PO TBDP
4.0000 mg | ORAL_TABLET | Freq: Once | ORAL | Status: AC
  Administered 2015-12-14: 4 mg via ORAL

## 2015-12-14 MED ORDER — CLINDAMYCIN HCL 300 MG PO CAPS: 300.0000 mg | ORAL_CAPSULE | Freq: Four times a day (QID) | ORAL | 0 refills | Status: DC

## 2015-12-14 MED ORDER — IOPAMIDOL (ISOVUE-300) INJECTION 61%
INTRAVENOUS | Status: AC
  Administered 2015-12-14: 75 mL
  Filled 2015-12-14: qty 75

## 2015-12-14 MED ORDER — ONDANSETRON 4 MG PO TBDP
ORAL_TABLET | ORAL | Status: AC
  Filled 2015-12-14: qty 1

## 2015-12-14 MED ORDER — CLINDAMYCIN HCL 150 MG PO CAPS
300.0000 mg | ORAL_CAPSULE | Freq: Once | ORAL | Status: AC
  Administered 2015-12-14: 300 mg via ORAL
  Filled 2015-12-14: qty 2

## 2015-12-14 NOTE — Discharge Instructions (Signed)
Please take all of your antibiotics until finished!   You may develop abdominal discomfort or diarrhea from the antibiotic. You may help offset this with probiotics which you can buy or get in yogurt.  Please call the ENT physician listed to schedule a follow up appointment.  Return to ER for fevers, difficulty breathing, difficulty swallowing, new or worsening symptoms, any additional concerns.

## 2015-12-14 NOTE — ED Notes (Signed)
Pt to and from CT scanner on stretcher with tech, tolerated well.

## 2015-12-14 NOTE — ED Triage Notes (Signed)
Pt reports spider bite to R posterior thing a week and a half ago, x3-4 days ago reports swelling to face and neck. States worse tonight.

## 2015-12-14 NOTE — ED Notes (Signed)
Pt ambulated from waiting area. Pt tolerated well.

## 2015-12-14 NOTE — ED Provider Notes (Signed)
[facility]-EMERGENCY DEPT Provider Note   CSN: [id number] Arrival date & time: 12/14/15  0241     History   Chief Complaint Chief Complaint  Patient presents with  . Insect Bite  . Emesis    HPI Megan Edwards is a 40 y.o. female.  The history is provided by the patient and medical records. No language interpreter was used.  Emesis   Pertinent negatives include no abdominal pain, no chills, no cough, no fever and no headaches.   Megan HalstedKelly L Clasby is a 40 y.o. female  with a PMH of HTN on labetalol, depression who presents to the Emergency Department with multiple complaints:  1. Worsening insect bite to right medial thigh that occurred 1.5 weeks ago. The area has opened slightly and drained blood. No purulent discharge/drainage. She had a similar bite right next to this one which has already healed, however this insect bite has become more red and swollen. Denies fever. Has applied warm compresses and neosporin with mild relief.   2. Worsening facial and neck swelling x 2 days associated with left sided facial pain and sore throat and dysphagia. Denies shortness of breath. No ACEI. Only allergy is PCN and she has not taken any new medications.   3. Patient is also complaining of nausea that she has been experiencing for the last three months since she delivered her child. She is followed by her OBGYN for this issue. No abdominal pain or emesis.   Past Medical History:  Diagnosis Date  . Abnormal Pap smear   . Depression   . Genital herpes 2000  . Hypertension   . Infection    UTI  . Ovarian cyst   . SVT (supraventricular tachycardia) [facility](HCC)     Patient Active Problem List   Diagnosis Date Noted  . Syncope and collapse   . Chronic pain syndrome   . Morbid obesity due to excess calories (HCC)   . Moderate mixed bipolar I disorder (HCC)   . AKI (acute kidney injury) (HCC) 10/21/2015  . Arterial hypotension 10/21/2015  . SIRS with acute organ dysfunction due to infectious  process (HCC) 10/21/2015  . Acute respiratory failure with hypoxia (HCC)   . Altered mental status 08/13/2015  . Alcohol intoxication (HCC) 08/12/2015  . Acute hypoxemic respiratory failure (HCC)   . NSVD (normal spontaneous vaginal delivery) 06/13/2015  . Normal labor 06/11/2015  . History of postpartum hemorrhage, currently pregnant 05/11/2015  . Immediate postpartum hemorrhage, with delivery 11/28/2012  . Pregnancy induced hypertension 11/25/2012  . Elderly primigravida, antepartum 06/26/2012  . Polysubstance (including opioids) dependence with physiol dependence (HCC) 08/11/2011    Past Surgical History:  Procedure Laterality Date  . addenoidectomy    . BREAST ENHANCEMENT SURGERY    . BREAST SURGERY    . KNEE SURGERY    . KNEE SURGERY Left   . TONSILLECTOMY    . WISDOM TOOTH EXTRACTION      OB History    Gravida Para Term Preterm AB Living   5 2 2  0 3 2   SAB TAB Ectopic Multiple Live Births   1 2 0 0 2       Home Medications    Prior to Admission medications   Medication Sig Start Date End Date Taking? Authorizing Provider  BIOTIN PO Take 1 tablet by mouth 2 (two) times daily.    Yes Historical Provider, MD  buprenorphine-naloxone (SUBOXONE) 2-0.5 MG SUBL SL tablet Place 1 tablet under the tongue every evening.  Yes Historical Provider, MD  labetalol (NORMODYNE) 100 MG tablet Take 1 tablet (100 mg total) by mouth 2 (two) times daily. Patient taking differently: Take 200 mg by mouth 3 (three) times daily.  10/22/15  Yes Vassie Loll, MD  QUEtiapine (SEROQUEL) 100 MG tablet Take 1 tablet (100 mg total) by mouth 3 (three) times daily. 10/22/15  Yes Vassie Loll, MD  SUBOXONE 8-2 MG FILM Take 2 Film by mouth every morning. 12/08/15  Yes Historical Provider, MD  clindamycin (CLEOCIN) 300 MG capsule Take 1 capsule (300 mg total) by mouth every 6 (six) hours. 12/14/15   Chase Picket Tomorrow Dehaas, PA-C  medroxyPROGESTERone (PROVERA) 10 MG tablet Take 1 tablet (10 mg total) by mouth  daily. Patient not taking: Reported on 12/14/2015 10/10/15   Brock Bad, MD  valACYclovir (VALTREX) 1000 MG tablet Take 1 tablet (1,000 mg total) by mouth as needed (with flare ups). 12/18/14   Dorathy Kinsman, CNM    Family History Family History  Problem Relation Age of Onset  . Alcoholism    . Leukemia    . Cervical cancer    . Bone cancer    . Cirrhosis    . Diabetes Mellitus II    . Depression    . Hypertension    . Thyroid cancer      Social History Social History  Substance Use Topics  . Smoking status: Never Smoker  . Smokeless tobacco: Never Used  . Alcohol use 0.0 oz/week     Comment: 4 40's beer daily- not currently     Allergies   Other and Penicillins   Review of Systems Review of Systems  Constitutional: Negative for chills and fever.  HENT: Positive for facial swelling, sore throat and trouble swallowing. Negative for congestion.   Eyes: Negative for visual disturbance.  Respiratory: Negative for cough and shortness of breath.   Cardiovascular: Negative.   Gastrointestinal: Positive for nausea. Negative for abdominal pain and vomiting.  Genitourinary: Negative for dysuria.  Musculoskeletal: Negative for back pain.  Skin: Positive for wound.  Neurological: Negative for headaches.     Physical Exam Updated Vital Signs BP 136/100 (BP Location: Left Arm)   Pulse 67   Temp 98.4 F (36.9 C) (Oral)   Resp 23   LMP 06/12/2015 (Exact Date)   SpO2 97%   Physical Exam  Constitutional: She is oriented to person, place, and time. She appears well-developed and well-nourished. No distress.  HENT:  Head: Normocephalic and atraumatic.  OP with no erythema or exudates. TTP of left upper gum line and poor oral dentition noted. Midline uvula.   Neck: Normal range of motion.  TTP of anterior neck and submental region.   Cardiovascular: Normal rate, regular rhythm and normal heart sounds.   Pulmonary/Chest: Effort normal and breath sounds normal. No  respiratory distress. She has no wheezes. She has no rales. She exhibits no tenderness.  Abdominal: Soft. She exhibits no distension. There is no tenderness.  Musculoskeletal: She exhibits no edema.  Lymphadenopathy:    She has no cervical adenopathy.  Neurological: She is alert and oriented to person, place, and time.  Skin: Skin is warm and dry.  Right thigh with 1.5 cm circular lesion c/w insect bite with surrounding erythema and tenderness.   Nursing note and vitals reviewed.    ED Treatments / Results  Labs (all labs ordered are listed, but only abnormal results are displayed) Labs Reviewed  BASIC METABOLIC PANEL - Abnormal; Notable for the following:  Result Value   Glucose, Bld 111 (*)    All other components within normal limits  CBC WITH DIFFERENTIAL/PLATELET  I-STAT BETA HCG BLOOD, ED ([facility], [facility], [facility] ONLY)    EKG  EKG Interpretation None       Radiology Ct Soft Tissue Neck W Contrast  Result Date: 12/14/2015 CLINICAL DATA:  Facial swelling submandibular region. EXAM: CT NECK WITH CONTRAST TECHNIQUE: Multidetector CT imaging of the neck was performed using the standard protocol following the bolus administration of intravenous contrast. CONTRAST:  75mL ISOVUE-300 IOPAMIDOL (ISOVUE-300) INJECTION 61% COMPARISON:  None. FINDINGS: Pharynx and larynx: Mild asymmetric fullness of the right tonsil and hypopharynx. Effacement of the vallecula on the right. This may be related to facial asymmetry as the mandible is significantly positioned to the right of the maxilla. Direct visualization recommended to exclude a mass lesion. Normal vocal cords. No tongue mass. Salivary glands: No inflammation, mass, or stone. Thyroid: 5 mm right thyroid nodule. Small calcification left thyroid. Thyroid gland within normal limits for size. Lymph nodes: No pathologic adenopathy. Sub cm submandibular nodes bilaterally. Right level 2 lymph node 8 mm. Left level 2 lymph node 12 mm. Small posterior  nodes bilaterally. Vascular: Carotid artery and jugular vein patent bilaterally. Limited intracranial: Negative Visualized orbits: Negative Mastoids and visualized paranasal sinuses: Mild mucosal edema left maxillary sinus. Remaining sinuses clear. Skeleton: No acute skeletal abnormality. Upper chest: 4 mm nodule right upper lobe. No other lung nodules in the apices Other: No evidence of skin thickening or facial edema IMPRESSION: Mild asymmetry of the pharyngeal tonsil on the right which may be. Direct visualization recommended to rule out tonsillar mass lesion on the right. Negative for cervical adenopathy. Normal parotid and submandibular gland bilaterally. Electronically Signed   By: Marlan Palau M.D.   On: 12/14/2015 09:44    Procedures Procedures (including critical care time)  Medications Ordered in ED Medications  ondansetron (ZOFRAN-ODT) 4 MG disintegrating tablet (not administered)  ondansetron (ZOFRAN-ODT) disintegrating tablet 4 mg (4 mg Oral Given 12/14/15 0404)  clindamycin (CLEOCIN) capsule 300 mg (300 mg Oral Given 12/14/15 0809)  iopamidol (ISOVUE-300) 61 % injection (75 mLs  Contrast Given 12/14/15 0910)  ketorolac (TORADOL) 30 MG/ML injection 30 mg (30 mg Intravenous Given 12/14/15 0936)     Initial Impression / Assessment and Plan / ED Course  I have reviewed the triage vital signs and the nursing notes.  Pertinent labs & imaging results that were available during my care of the patient were reviewed by me and considered in my medical decision making (see chart for details).  Clinical Course   Megan Edwards is a 40 y.o. female who presents to ED for two complaints:  1. Insect bite that now appears infected. Bedside ultrasound with no focal fluid collection. Will treat with clindamycin.   2. Facial swelling and neck pain. Patient speaking in full sentences with clear lung exam and 97% O2 on RA. No exudates or tonsillar hypertrophy appreciated. Does have some tenderness  to palpation of dentition on the left. No angioedema. Given neck tenderness, CT soft tissue was obtained and reviewed with attending, Dr. Jeraldine Loots. Reassuring. No neck space infection. Follow up with ENT. Clinda will also cover for any possible dental infection.   Reasons to return to ED discussed and all questions answered.   Final Clinical Impressions(s) / ED Diagnoses   Final diagnoses:  Cellulitis of right lower extremity  Facial pain  Sore throat    New Prescriptions New Prescriptions  CLINDAMYCIN (CLEOCIN) 300 MG CAPSULE    Take 1 capsule (300 mg total) by mouth every 6 (six) hours.     [facility] Eligah Anello, PA-C 12/14/15 1046    Gerhard Munch, MD 12/14/15 [phone]

## 2015-12-27 ENCOUNTER — Ambulatory Visit: Payer: Self-pay | Admitting: Obstetrics

## 2016-01-10 ENCOUNTER — Ambulatory Visit: Payer: Medicaid Other | Admitting: Obstetrics

## 2016-01-10 ENCOUNTER — Ambulatory Visit: Payer: Self-pay | Admitting: Family Medicine

## 2016-01-12 ENCOUNTER — Other Ambulatory Visit (HOSPITAL_COMMUNITY)
Admission: RE | Admit: 2016-01-12 | Discharge: 2016-01-12 | Disposition: A | Discharge status: Home / Self Care | Payer: Medicaid Other | Source: Ambulatory Visit | Attending: Obstetrics | Admitting: Obstetrics

## 2016-01-12 ENCOUNTER — Ambulatory Visit (INDEPENDENT_AMBULATORY_CARE_PROVIDER_SITE_OTHER): Payer: Medicaid Other | Admitting: Obstetrics

## 2016-01-12 ENCOUNTER — Encounter: Payer: Self-pay | Admitting: Obstetrics

## 2016-01-12 VITALS — Temp 98.5°F | Wt 227.2 lb

## 2016-01-12 DIAGNOSIS — L659 Nonscarring hair loss, unspecified: Secondary | ICD-10-CM

## 2016-01-12 DIAGNOSIS — N760 Acute vaginitis: Secondary | ICD-10-CM | POA: Diagnosis present

## 2016-01-12 DIAGNOSIS — Z113 Encounter for screening for infections with a predominantly sexual mode of transmission: Secondary | ICD-10-CM | POA: Diagnosis present

## 2016-01-12 DIAGNOSIS — R635 Abnormal weight gain: Secondary | ICD-10-CM | POA: Diagnosis not present

## 2016-01-12 DIAGNOSIS — N911 Secondary amenorrhea: Secondary | ICD-10-CM

## 2016-01-12 DIAGNOSIS — R5383 Other fatigue: Secondary | ICD-10-CM

## 2016-01-12 NOTE — Progress Notes (Signed)
Patient ID: Megan Edwards, female   DOB: [birth year], 40 y.o.   MRN: [id number]  Chief Complaint  Patient presents with  . other    Thyroid issues  . other    Has not had menstrual cycle, sweaty,hot flashes    HPI Megan Edwards is a 40 y.o. female.  No period since delivery of baby March 2018.  Has been gaining weight, extremely tired all the time and has had hair loss.  Has occasional hot flashes.  Has tried Provera challenge at least twice without a withdrawal bleed. HPI  Past Medical History:  Diagnosis Date  . Abnormal Pap smear   . Depression   . Genital herpes 2000  . Hypertension   . Infection    UTI  . Ovarian cyst   . SVT (supraventricular tachycardia) (HCC)     Past Surgical History:  Procedure Laterality Date  . addenoidectomy    . BREAST ENHANCEMENT SURGERY    . BREAST SURGERY    . KNEE SURGERY    . KNEE SURGERY Left   . TONSILLECTOMY    . WISDOM TOOTH EXTRACTION      Family History  Problem Relation Age of Onset  . Alcoholism    . Leukemia    . Cervical cancer    . Bone cancer    . Cirrhosis    . Diabetes Mellitus II    . Depression    . Hypertension    . Thyroid cancer      Social History Social History  Substance Use Topics  . Smoking status: Never Smoker  . Smokeless tobacco: Never Used  . Alcohol use 0.0 oz/week     Comment: 4 40's beer daily- not currently    Allergies  Allergen Reactions  . Other     Pt does not take narcotics, is on Suboxone.   Marland Kitchen Penicillins Rash and Other (See Comments)    Has patient had a PCN reaction causing immediate rash, facial/tongue/throat swelling, SOB or lightheadedness with hypotension: Yes Has patient had a PCN reaction causing severe rash involving mucus membranes or skin necrosis: No Has patient had a PCN reaction that required hospitalization No Has patient had a PCN reaction occurring within the last 10 years: No If all of the above answers are "NO", then may proceed with Cephalosporin use.     Current Outpatient Prescriptions  Medication Sig Dispense Refill  . BIOTIN PO Take 1 tablet by mouth 2 (two) times daily.     . buprenorphine-naloxone (SUBOXONE) 2-0.5 MG SUBL SL tablet Place 1 tablet under the tongue every evening.     . clindamycin (CLEOCIN) 300 MG capsule Take 1 capsule (300 mg total) by mouth every 6 (six) hours. 40 capsule 0  . labetalol (NORMODYNE) 100 MG tablet Take 1 tablet (100 mg total) by mouth 2 (two) times daily. (Patient taking differently: Take 200 mg by mouth 3 (three) times daily. ) 60 tablet 1  . medroxyPROGESTERone (PROVERA) 10 MG tablet Take 1 tablet (10 mg total) by mouth daily. (Patient not taking: Reported on 12/14/2015) 10 tablet 2  . QUEtiapine (SEROQUEL) 100 MG tablet Take 1 tablet (100 mg total) by mouth 3 (three) times daily.    . SUBOXONE 8-2 MG FILM Take 2 Film by mouth every morning.  0  . valACYclovir (VALTREX) 1000 MG tablet Take 1 tablet (1,000 mg total) by mouth as needed (with flare ups). 30 tablet 3   No current facility-administered medications for this visit.  Review of Systems Review of Systems Constitutional: positive for fatigue and weight gain for fatigue and weight loss Respiratory: negative for cough and wheezing Cardiovascular: negative for chest pain, fatigue and palpitations Gastrointestinal: negative for abdominal pain and change in bowel habits Genitourinary:positive for amenorrhea Integument/breast: positive for hair loss Musculoskeletal:negative for myalgias Neurological: negative for gait problems and tremors Behavioral/Psych: negative for abusive relationship, depression Endocrine: positive for hot flashes   Temperature 98.5 F (36.9 C), temperature source Oral, weight 227 lb 3.2 oz (103.1 kg), last menstrual period 06/12/2015, not currently breastfeeding.  Physical Exam Physical Exam General:   alert  Skin:   no rash or abnormalities  Lungs:   clear to auscultation bilaterally  Heart:   regular rate and  rhythm, S1, S2 normal, no murmur, click, rub or gallop  Breasts:   normal without suspicious masses, skin or nipple changes or axillary nodes  Abdomen:  normal findings: no organomegaly, soft, non-tender and no hernia  Pelvis:  External genitalia: normal general appearance Urinary system: urethral meatus normal and bladder without fullness, nontender Vaginal: normal without tenderness, induration or masses Cervix: normal appearance Adnexa: normal bimanual exam Uterus: anteverted and non-tender, normal size    50% of 15 min visit spent on counseling and coordination of care.   Data Reviewed Labs  Assessment     Amenorrhea Excessive weight gain, lethargy, hair loss and vasomotor symptoms could involve Hypothyroidism.    Plan     R/O Hypothyroidism.  TSH drawn.  F/U 1 week.   Orders Placed This Encounter  Procedures  . TSH  . CBC  . Comp Met (CMET)   No orders of the defined types were placed in this encounter.

## 2016-01-13 LAB — CBC
HEMOGLOBIN: 13.3 g/dL (ref 11.1–15.9)
Hematocrit: 40.5 % (ref 34.0–46.6)
MCH: 28.5 pg (ref 26.6–33.0)
MCHC: 32.8 g/dL (ref 31.5–35.7)
MCV: 87 fL (ref 79–97)
Platelets: 314 10*3/uL (ref 150–379)
RBC: 4.66 x10E6/uL (ref 3.77–5.28)
RDW: 14.5 % (ref 12.3–15.4)
WBC: 7.3 10*3/uL (ref 3.4–10.8)

## 2016-01-13 LAB — COMPREHENSIVE METABOLIC PANEL
ALK PHOS: 99 IU/L (ref 39–117)
ALT: 25 IU/L (ref 0–32)
AST: 21 IU/L (ref 0–40)
Albumin/Globulin Ratio: 1.6 (ref 1.2–2.2)
Albumin: 4.4 g/dL (ref 3.5–5.5)
BUN/Creatinine Ratio: 27 — ABNORMAL HIGH (ref 9–23)
BUN: 22 mg/dL (ref 6–24)
Bilirubin Total: <0.2 mg/dL (ref 0.0–1.2)
CALCIUM: 9.6 mg/dL (ref 8.7–10.2)
CO2: 22 mmol/L (ref 18–29)
CREATININE: 0.83 mg/dL (ref 0.57–1.00)
Chloride: 98 mmol/L (ref 96–106)
GFR calc Af Amer: 102 mL/min/{1.73_m2} (ref >59)
GFR, EST NON AFRICAN AMERICAN: 88 mL/min/{1.73_m2} (ref >59)
GLOBULIN, TOTAL: 2.8 g/dL (ref 1.5–4.5)
GLUCOSE: 88 mg/dL (ref 65–99)
Potassium: 4.6 mmol/L (ref 3.5–5.2)
SODIUM: 137 mmol/L (ref 134–144)
Total Protein: 7.2 g/dL (ref 6.0–8.5)

## 2016-01-13 LAB — TSH: TSH: 7.72 u[IU]/mL — AB (ref 0.450–4.500)

## 2016-01-15 ENCOUNTER — Other Ambulatory Visit: Payer: Self-pay | Admitting: Obstetrics

## 2016-01-15 DIAGNOSIS — E039 Hypothyroidism, unspecified: Secondary | ICD-10-CM

## 2016-01-15 LAB — CERVICOVAGINAL ANCILLARY ONLY
Chlamydia: NEGATIVE
Neisseria Gonorrhea: NEGATIVE
TRICH (WINDOWPATH): NEGATIVE

## 2016-01-17 LAB — CERVICOVAGINAL ANCILLARY ONLY
Bacterial vaginitis: NEGATIVE
Candida vaginitis: NEGATIVE

## 2016-01-18 ENCOUNTER — Emergency Department (HOSPITAL_COMMUNITY)
Admission: EM | Admit: 2016-01-18 | Discharge: 2016-01-20 | Disposition: A | Discharge status: Other Acute Inpatient Hospital | Payer: Medicaid Other | Attending: Emergency Medicine | Admitting: Emergency Medicine

## 2016-01-18 ENCOUNTER — Encounter (HOSPITAL_COMMUNITY): Payer: Self-pay | Admitting: Emergency Medicine

## 2016-01-18 DIAGNOSIS — Z79899 Other long term (current) drug therapy: Secondary | ICD-10-CM | POA: Diagnosis not present

## 2016-01-18 DIAGNOSIS — Z806 Family history of leukemia: Secondary | ICD-10-CM | POA: Diagnosis not present

## 2016-01-18 DIAGNOSIS — I10 Essential (primary) hypertension: Secondary | ICD-10-CM | POA: Diagnosis not present

## 2016-01-18 DIAGNOSIS — F1920 Other psychoactive substance dependence, uncomplicated: Secondary | ICD-10-CM | POA: Diagnosis present

## 2016-01-18 DIAGNOSIS — F22 Delusional disorders: Secondary | ICD-10-CM | POA: Diagnosis not present

## 2016-01-18 DIAGNOSIS — Z811 Family history of alcohol abuse and dependence: Secondary | ICD-10-CM | POA: Diagnosis not present

## 2016-01-18 DIAGNOSIS — F29 Unspecified psychosis not due to a substance or known physiological condition: Secondary | ICD-10-CM | POA: Diagnosis not present

## 2016-01-18 DIAGNOSIS — R451 Restlessness and agitation: Secondary | ICD-10-CM | POA: Diagnosis not present

## 2016-01-18 HISTORY — DX: Bipolar disorder, unspecified: F31.9

## 2016-01-18 LAB — ETHANOL: Alcohol, Ethyl (B): <5 mg/dL (ref <5)

## 2016-01-18 LAB — COMPREHENSIVE METABOLIC PANEL
ALK PHOS: 87 U/L (ref 38–126)
ALT: 57 U/L — AB (ref 14–54)
ANION GAP: 12 (ref 5–15)
AST: 61 U/L — ABNORMAL HIGH (ref 15–41)
Albumin: 4.8 g/dL (ref 3.5–5.0)
BILIRUBIN TOTAL: 0.9 mg/dL (ref 0.3–1.2)
BUN: 12 mg/dL (ref 6–20)
CALCIUM: 9.9 mg/dL (ref 8.9–10.3)
CO2: 26 mmol/L (ref 22–32)
CREATININE: 1.08 mg/dL — AB (ref 0.44–1.00)
Chloride: 102 mmol/L (ref 101–111)
Glucose, Bld: 127 mg/dL — ABNORMAL HIGH (ref 65–99)
Potassium: 3.7 mmol/L (ref 3.5–5.1)
Sodium: 140 mmol/L (ref 135–145)
TOTAL PROTEIN: 8.4 g/dL — AB (ref 6.5–8.1)

## 2016-01-18 LAB — CBC WITH DIFFERENTIAL/PLATELET
Basophils Absolute: 0 10*3/uL (ref 0.0–0.1)
Basophils Relative: 0 %
EOS ABS: 0 10*3/uL (ref 0.0–0.7)
Eosinophils Relative: 0 %
HEMATOCRIT: 40 % (ref 36.0–46.0)
HEMOGLOBIN: 13.8 g/dL (ref 12.0–15.0)
LYMPHS ABS: 1 10*3/uL (ref 0.7–4.0)
LYMPHS PCT: 11 %
MCH: 29.2 pg (ref 26.0–34.0)
MCHC: 34.5 g/dL (ref 30.0–36.0)
MCV: 84.6 fL (ref 78.0–100.0)
MONOS PCT: 5 %
Monocytes Absolute: 0.5 10*3/uL (ref 0.1–1.0)
NEUTROS PCT: 84 %
Neutro Abs: 8.1 10*3/uL — ABNORMAL HIGH (ref 1.7–7.7)
Platelets: 337 10*3/uL (ref 150–400)
RBC: 4.73 MIL/uL (ref 3.87–5.11)
RDW: 14.2 % (ref 11.5–15.5)
WBC: 9.6 10*3/uL (ref 4.0–10.5)

## 2016-01-18 LAB — RAPID URINE DRUG SCREEN, HOSP PERFORMED
Amphetamines: POSITIVE — AB
BARBITURATES: NOT DETECTED
BENZODIAZEPINES: NOT DETECTED
COCAINE: NOT DETECTED
OPIATES: NOT DETECTED
Tetrahydrocannabinol: NOT DETECTED

## 2016-01-18 LAB — PREGNANCY, URINE: Preg Test, Ur: NEGATIVE

## 2016-01-18 MED ORDER — LORAZEPAM 2 MG/ML IJ SOLN
2.0000 mg | Freq: Once | INTRAMUSCULAR | Status: AC
  Administered 2016-01-18: 2 mg via INTRAMUSCULAR

## 2016-01-18 MED ORDER — QUETIAPINE FUMARATE 25 MG PO TABS
100.0000 mg | ORAL_TABLET | Freq: Two times a day (BID) | ORAL | Status: DC
  Administered 2016-01-18 – 2016-01-19 (×4): 100 mg via ORAL
  Filled 2016-01-18 (×5): qty 4

## 2016-01-18 MED ORDER — LORAZEPAM 2 MG/ML IJ SOLN
INTRAMUSCULAR | Status: AC
  Filled 2016-01-18: qty 1

## 2016-01-18 MED ORDER — LORAZEPAM 1 MG PO TABS: 0.0000 mg | ORAL_TABLET | Freq: Two times a day (BID) | ORAL | Status: DC

## 2016-01-18 MED ORDER — HALOPERIDOL LACTATE 5 MG/ML IJ SOLN
5.0000 mg | Freq: Once | INTRAMUSCULAR | Status: AC
  Administered 2016-01-18: 5 mg via INTRAMUSCULAR

## 2016-01-18 MED ORDER — LORAZEPAM 1 MG PO TABS
2.0000 mg | ORAL_TABLET | Freq: Once | ORAL | Status: AC
  Administered 2016-01-18: 2 mg via ORAL
  Filled 2016-01-18: qty 2

## 2016-01-18 MED ORDER — LABETALOL HCL 100 MG PO TABS
50.0000 mg | ORAL_TABLET | Freq: Two times a day (BID) | ORAL | Status: DC
  Administered 2016-01-18 – 2016-01-19 (×4): 50 mg via ORAL
  Filled 2016-01-18 (×5): qty 0.5

## 2016-01-18 MED ORDER — HALOPERIDOL LACTATE 5 MG/ML IJ SOLN
INTRAMUSCULAR | Status: AC
  Filled 2016-01-18: qty 1

## 2016-01-18 MED ORDER — LORAZEPAM 1 MG PO TABS
0.0000 mg | ORAL_TABLET | Freq: Four times a day (QID) | ORAL | Status: DC
  Administered 2016-01-19: 1 mg via ORAL
  Filled 2016-01-18: qty 1

## 2016-01-18 MED ORDER — ZIPRASIDONE MESYLATE 20 MG IM SOLR: 20.0000 mg | Freq: Once | INTRAMUSCULAR | Status: DC | PRN

## 2016-01-18 NOTE — ED Notes (Signed)
Handcuff to left wrist removed by [employer] on transfer to Pod C.  Noted mild bruising to wrist and mild indentation to wrist.  Pt oriented to self and location.  Disoriented to scope of situation.

## 2016-01-18 NOTE — ED Notes (Signed)
Pt belongings at nurses station in pod a. Pt has empty bottle of draino, motor oil, soda, air plane bottles of alcohol, and clothing in bags

## 2016-01-18 NOTE — ED Notes (Signed)
[employer] worker called and spoke with RN.  [employer] will be coming to ED to speak with pt sometime today.

## 2016-01-18 NOTE — ED Notes (Signed)
Pt handcuffs moved from behind back to one arm cuffed to bed rail. She is much calmer at this time and cooperative for vs.

## 2016-01-18 NOTE — BH Assessment (Addendum)
Tele Assessment Note   Megan Edwards is a 40 y.o. female who presents to [facility] under IVC completed by EDP due to psychosis. Pt shares she has a 40 yr old and a 7 month old. Pt denies SI & HI, but endorses having some AVH 4 days ago when she ate a cheesecake that her mother made her. Pt was unclear on what she saw, but pt indicated that she heard ringing in her ears. Pt shares she has not slept in 4 days, since eating that cheesecake. Pt was able to answer questions appropriately. When asked what occurred for her to present to the hospital, pt launched into an involved story about the father of the 65 month old, Sherral Hammers, and how she believes he is trying to poison and/or kill her and the children. Pt indicates that he has given her a car to drive and the fumes in the car are coming out into the car and everytime she drives it, the 42 month old stops breathing. Pt also reports that FOB is a [employer]ar[employer] manager and he has gotten into all of her information-erasing her information from the [facility], as if she's never been there; intercepting texts between her and agencies where she gets help from; looking at her as she looks at her computer. Pt also indicates that FOB has been inside her home w/out her knowledge, b/c she has found half used bottles of boric acid, WD40, wood glue, and other things that she hadn't put there. Pt adds that when she calls the police, he conveniently removes the items. Pt also alludes to the fact that she believes her mom and FOB are working together against her.   Writer spoke to pt's CPS in home worker, [place], to obtain collateral information. Megan intimated that pt's current bx is not her baseline. Aundra Millet verifies the FOB to be Sherral Hammers but denies that pt has ever shared any of the concerns about him trying to poison/kill her and the kids. Pt tested positive for amphetamines, which is from a prescription diet pill from a bariatric doctor, Aundra Millet  reports.  Diagnosis: Bipolar I, current or most recent episode manic, with psychotic features  Past Medical History:  Past Medical History:  Diagnosis Date  . Abnormal Pap smear   . Bipolar 1 disorder (HCC)   . Depression   . Genital herpes 2000  . Hypertension   . Infection    UTI  . Ovarian cyst   . SVT (supraventricular tachycardia) (HCC)     Past Surgical History:  Procedure Laterality Date  . addenoidectomy    . BREAST ENHANCEMENT SURGERY    . BREAST SURGERY    . KNEE SURGERY    . KNEE SURGERY Left   . TONSILLECTOMY    . WISDOM TOOTH EXTRACTION      Family History:  Family History  Problem Relation Age of Onset  . Alcoholism    . Leukemia    . Cervical cancer    . Bone cancer    . Cirrhosis    . Diabetes Mellitus II    . Depression    . Hypertension    . Thyroid cancer      Social History:  reports that she has never smoked. She has never used smokeless tobacco. She reports that she drinks alcohol. She reports that she does not use drugs.  Additional Social History:  Alcohol / Drug Use Pain Medications: see PTA meds Prescriptions: see PTA meds Over the Counter:  see PTA meds History of alcohol / drug use?: Yes (pt been taking Suboxen for over 3 years and reports being clean for as long. Positive for amphetamines. Pt unable to adequately explain why, but denies any use)  CIWA: CIWA-Ar BP: 141/89 Pulse Rate: 103 COWS:    PATIENT STRENGTHS: (choose at least two) Average or above average intelligence Capable of independent living  Allergies:  Allergies  Allergen Reactions  . Other     Pt does not take narcotics, is on Suboxone.   Marland Kitchen. Penicillins Rash and Other (See Comments)    Has patient had a PCN reaction causing immediate rash, facial/tongue/throat swelling, SOB or lightheadedness with hypotension: Yes Has patient had a PCN reaction causing severe rash involving mucus membranes or skin necrosis: No Has patient had a PCN reaction that required  hospitalization No Has patient had a PCN reaction occurring within the last 10 years: No If all of the above answers are "NO", then may proceed with Cephalosporin use.    Home Medications:  (Not in a hospital admission)  OB/GYN Status:  No LMP recorded.  General Assessment Data [facility] Assessment: In system Is this a Tele or Face-to-Face Assessment?: Tele Assessment Is this an Initial Assessment or a Re-assessment for this encounter?: Initial Assessment Marital status: Single Is patient pregnant?: No Pregnancy Status: No Living Arrangements: Children Can pt return to current living arrangement?: Yes Admission Status: Involuntary Is patient capable of signing voluntary admission?: Yes Referral Source: Self/Family/Friend Insurance type: medicaid     Crisis Care Plan Living Arrangements: Children Name of Psychiatrist: [facility] Name of Therapist: [facility]  Education Status Is patient currently in school?: No  Risk to self with the past 6 months Suicidal Ideation: No Has patient been a risk to self within the past 6 months prior to admission? : No Suicidal Intent: No Has patient had any suicidal intent within the past 6 months prior to admission? : No Is patient at risk for suicide?: No Suicidal Plan?: No Has patient had any suicidal plan within the past 6 months prior to admission? : No Access to Means: No What has been your use of drugs/alcohol within the last 12 months?: see above Previous Attempts/Gestures: No Intentional Self Injurious Behavior: None Family Suicide History: Unknown Persecutory voices/beliefs?: No Depression: No Substance abuse history and/or treatment for substance abuse?: Yes Suicide prevention information given to non-admitted patients: Not applicable  Risk to Others within the past 6 months Homicidal Ideation: No Does patient have any lifetime risk of violence toward others beyond the six months  prior to admission? : Unknown Thoughts of Harm to Others: No Current Homicidal Intent: No Current Homicidal Plan: No Access to Homicidal Means: No History of harm to others?: No Assessment of Violence: None Noted Does patient have access to weapons?: No Criminal Charges Pending?: No Does patient have a court date: No Is patient on probation?: No  Psychosis Hallucinations: None noted Delusions: Unspecified  Mental Status Report Appearance/Hygiene: Unremarkable Eye Contact: Good Motor Activity: Unremarkable Speech: Rapid, Pressured Level of Consciousness: Alert Mood: Anxious Affect: Appropriate to circumstance Anxiety Level: Moderate Thought Processes: Tangential Judgement: Impaired Orientation: Place, Person Obsessive Compulsive Thoughts/Behaviors: None  Cognitive Functioning Concentration: Decreased Memory: Unable to Assess IQ: Average Insight: see judgement above Impulse Control: Unable to Assess Appetite: Fair Sleep: Decreased Total Hours of Sleep:  (denies sleeping for 4 days) Vegetative Symptoms: None  ADLScreening [facility] Assessment Services) Patient's cognitive ability adequate to  safely complete daily activities?: Yes Patient able to express need for assistance with ADLs?: Yes Independently performs ADLs?: Yes (appropriate for developmental age)  Prior Inpatient Therapy Prior Inpatient Therapy: Yes Prior Therapy Dates: multiple dates-most recent in 2013 Prior Therapy Facilty/Provider(s): [facility] Reason for Treatment: polysubstance use  Prior Outpatient Therapy Prior Outpatient Therapy: No Does patient have an ACCT team?: No Does patient have Intensive In-House Services?  : No Does patient have [facility] services? : No Does patient have P4CC services?: No  ADL Screening (condition at time of admission) Patient's cognitive ability adequate to safely complete daily activities?: Yes Is the patient deaf or have difficulty hearing?: No Does the patient have  difficulty seeing, even when wearing glasses/contacts?: No Does the patient have difficulty concentrating, remembering, or making decisions?: Yes Patient able to express need for assistance with ADLs?: Yes Does the patient have difficulty dressing or bathing?: No Independently performs ADLs?: Yes (appropriate for developmental age) Does the patient have difficulty walking or climbing stairs?: No Weakness of Legs: None Weakness of Arms/Hands: None  Home Assistive Devices/Equipment Home Assistive Devices/Equipment: None  Therapy Consults (therapy consults require a physician order) PT Evaluation Needed: No OT Evalulation Needed: No SLP Evaluation Needed: No Abuse/Neglect Assessment (Assessment to be complete while patient is alone) Physical Abuse: Denies Verbal Abuse: Denies Sexual Abuse: Denies Exploitation of patient/patient's resources: Denies Self-Neglect: Denies Values / Beliefs Cultural Requests During Hospitalization: None Spiritual Requests During Hospitalization: None Consults Spiritual Care Consult Needed: No Social Work Consult Needed: No Merchant navy officer (For Healthcare) Does patient have an advance directive?: No Would patient like information on creating an advanced directive?: No - patient declined information    Additional Information 1:1 In Past 12 Months?: No CIRT Risk: No Elopement Risk: No Does patient have medical clearance?: Yes     Disposition:  Disposition Initial Assessment Completed for this Encounter: Yes (consulted with Elta Guadeloupe, NP) Disposition of Patient: Inpatient treatment program Type of inpatient treatment program: Adult  Laddie Aquas 01/18/2016 12:10 PM

## 2016-01-18 NOTE — Progress Notes (Signed)
Referred pt to the following for inpatient treatment.  [facility]- per [facility]- per [facility], [facility], [facility], [facility], [facility], [facility], [facility], [facility], [facility] at capacity.  Sherwin Hollingshed, MSW, LCSW Clinical Social Work, Disposition  01/18/2016 [phone] 

## 2016-01-18 NOTE — ED Triage Notes (Addendum)
Pt. presents with agitation , paranoid , delusions and auditory/visual hallucinations , pt. stated that she is being poisoned by her husband and  things crawling out of ears and skin . T. Neva SeatGreene PA advised nurse that pt.'s children are at pediatric section at ER and pt. Will need psychiatric placement / IVC.  Pt. denies suicidal ideation .

## 2016-01-18 NOTE — ED Notes (Signed)
Pharmacy contacted for scheduled Labetalol at this time.

## 2016-01-18 NOTE — ED Provider Notes (Signed)
[facility]EM[facility] DEPT Provider Note   CSN: [id number] Arrival date & time: 01/18/16  0240     History   Chief Complaint Chief Complaint  Patient presents with  . Behavior Problem   LEVEL 5 CAVEAT DUE TO PSYCHIATRIC DISORDER  HPI Megan Edwards is a 40 y.o. female.  The history is provided by the patient.  Mental Health Problem  Presenting symptoms: aggressive behavior, agitation, bizarre behavior, delusional and paranoid behavior   Patient accompanied by:  Law enforcement Degree of incapacity (severity):  Severe Onset quality:  Sudden Duration: UNKNOWN. Timing:  Constant Progression:  Worsening Chronicity:  New Context: alcohol use and drug abuse   Relieved by:  Nothing Worsened by:  Nothing Patient presents with altered mental status, concern for psychiatric disorder Apparently, she was brought to the hospital because police had been called to her house and it was noted that her children had been neglected Patient claims she was being poisoned by other individuals Her children were malnourished and appeared neglected - they are currently with grandmother after law enforcement and CPS involvement  Patient reports someone is after her - she reports "you don't know what he is capable of" She reports that her significant other is with Valorie Roosevelt and Elsie Lincoln and is poisoning her.   Past Medical History:  Diagnosis Date  . Abnormal Pap smear   . Bipolar 1 disorder (HCC)   . Depression   . Genital herpes 2000  . Hypertension   . Infection    UTI  . Ovarian cyst   . SVT (supraventricular tachycardia) [facility])     Patient Active Problem List   Diagnosis Date Noted  . Syncope and collapse   . Chronic pain syndrome   . Morbid obesity due to excess calories (HCC)   . Moderate mixed bipolar I disorder (HCC)   . AKI (acute kidney injury) (HCC) 10/21/2015  . Arterial hypotension 10/21/2015  . SIRS with acute organ dysfunction due to infectious process (HCC) 10/21/2015  .  Acute respiratory failure with hypoxia (HCC)   . Altered mental status 08/13/2015  . Alcohol intoxication (HCC) 08/12/2015  . Acute hypoxemic respiratory failure (HCC)   . NSVD (normal spontaneous vaginal delivery) 06/13/2015  . Normal labor 06/11/2015  . History of postpartum hemorrhage, currently pregnant 05/11/2015  . Immediate postpartum hemorrhage, with delivery 11/28/2012  . Pregnancy induced hypertension 11/25/2012  . Elderly primigravida, antepartum 06/26/2012  . Polysubstance (including opioids) dependence with physiol dependence (HCC) 08/11/2011    Past Surgical History:  Procedure Laterality Date  . addenoidectomy    . BREAST ENHANCEMENT SURGERY    . BREAST SURGERY    . KNEE SURGERY    . KNEE SURGERY Left   . TONSILLECTOMY    . WISDOM TOOTH EXTRACTION      OB History    Gravida Para Term Preterm AB Living   5 2 2  0 3 2   SAB TAB Ectopic Multiple Live Births   1 2 0 0 2       Home Medications    Prior to Admission medications   Medication Sig Start Date End Date Taking? Authorizing Provider  BIOTIN PO Take 1 tablet by mouth 2 (two) times daily.     Historical Provider, MD  buprenorphine-naloxone (SUBOXONE) 2-0.5 MG SUBL SL tablet Place 1 tablet under the tongue every evening.     Historical Provider, MD  clindamycin (CLEOCIN) 300 MG capsule Take 1 capsule (300 mg total) by mouth every 6 (six) hours. 12/14/15  [facility] Ward, PA-C  labetalol (NORMODYNE) 100 MG tablet Take 1 tablet (100 mg total) by mouth 2 (two) times daily. Patient taking differently: Take 200 mg by mouth 3 (three) times daily.  10/22/15   Vassie Loll, MD  medroxyPROGESTERone (PROVERA) 10 MG tablet Take 1 tablet (10 mg total) by mouth daily. Patient not taking: Reported on 12/14/2015 10/10/15   Brock Bad, MD  QUEtiapine (SEROQUEL) 100 MG tablet Take 1 tablet (100 mg total) by mouth 3 (three) times daily. 10/22/15   Vassie Loll, MD  SUBOXONE 8-2 MG FILM Take 2 Film by mouth every  morning. 12/08/15   Historical Provider, MD  valACYclovir (VALTREX) 1000 MG tablet Take 1 tablet (1,000 mg total) by mouth as needed (with flare ups). 12/18/14   Dorathy Kinsman, CNM    Family History Family History  Problem Relation Age of Onset  . Alcoholism    . Leukemia    . Cervical cancer    . Bone cancer    . Cirrhosis    . Diabetes Mellitus II    . Depression    . Hypertension    . Thyroid cancer      Social History Social History  Substance Use Topics  . Smoking status: Never Smoker  . Smokeless tobacco: Never Used  . Alcohol use 0.0 oz/week     Comment: 4 40's beer daily- not currently     Allergies   Other and Penicillins   Review of Systems Review of Systems  Unable to perform ROS: Psychiatric disorder  Psychiatric/Behavioral: Positive for agitation and paranoia.     Physical Exam Updated Vital Signs BP (!) 197/103 (BP Location: Right Arm)   Pulse (!) 132   Temp 99.2 F (37.3 C) (Oral)   Resp (!) 30   SpO2 97%   Physical Exam  CONSTITUTIONAL: Disheveled, agitated, yelling and flailing arms all around HEAD: Normocephalic/atraumatic EYES: pupils dilated bilaterally, +EOMI NECK: supple no meningeal signs SPINE/BACK:entire spine nontender CV: S1/S2 noted, tachycardic LUNGS: Lungs are clear to auscultation bilaterally ABDOMEN: soft, nontender NEURO: Pt is awake/alert, but yelling and screaming, moving all arms/legs and is trying to jump off of bed EXTREMITIES: pulses normal/equal, hands are handcuffed behind her (law enforcement present) SKIN: warm, diaphoretic PSYCH: anxious and agitated  ED Treatments / Results  Labs (all labs ordered are listed, but only abnormal results are displayed) Labs Reviewed  CBC WITH DIFFERENTIAL/PLATELET - Abnormal; Notable for the following:       Result Value   Neutro Abs 8.1 (*)    All other components within normal limits  COMPREHENSIVE METABOLIC PANEL - Abnormal; Notable for the following:    Glucose, Bld  127 (*)    Creatinine, Ser 1.08 (*)    Total Protein 8.4 (*)    AST 61 (*)    ALT 57 (*)    All other components within normal limits  RAPID URINE DRUG SCREEN, HOSP PERFORMED - Abnormal; Notable for the following:    Amphetamines POSITIVE (*)    All other components within normal limits  ETHANOL  PREGNANCY, URINE    EKG  EKG Interpretation  Date/Time:  Thursday January 18 2016 05:16:09 EDT Ventricular Rate:  130 PR Interval:    QRS Duration: 101 QT Interval:  329 QTC Calculation: 484 R Axis:   71 Text Interpretation:  Sinus tachycardia Probable anteroseptal infarct, old Baseline wander in lead(s) II III aVF Interpretation limited secondary to artifact Abnormal ekg Confirmed by Bebe Shaggy  MD, Nataleigh Griffin ([id number]) on  01/18/2016 5:29:19 AM       Radiology No results found.  Procedures Procedures  CRITICAL CARE Performed by: Joya GaskinsWICKLINE,Juna Caban W Total critical care time: 33 minutes Critical care time was exclusive of separately billable procedures and treating other patients. Critical care was necessary to treat or prevent imminent or life-threatening deterioration. Critical care was time spent personally by me on the following activities: development of treatment plan with patient and/or surrogate as well as nursing, discussions with consultants, evaluation of patient's response to treatment, examination of patient, obtaining history from patient or surrogate, ordering and performing treatments and interventions, ordering and review of laboratory studies, ordering and review of radiographic studies, pulse oximetry and re-evaluation of patient's condition. PATIENT WITH ACUTE AGITATION REQUIRING HALDOL/ATIVAN SHE WAS A THREAT TO HERSELF AND ALSO STAFF AND REQUIRED POLICE INVOLVEMENT FOR PATIENT SAFETY  Medications Ordered in ED Medications  haloperidol lactate (HALDOL) injection 5 mg (5 mg Intramuscular Given 01/18/16 0508)  LORazepam (ATIVAN) injection 2 mg (2 mg Intramuscular Given  01/18/16 0509)     Initial Impression / Assessment and Plan / ED Course  I have reviewed the triage vital signs and the nursing notes.  Pertinent labs  results that were available during my care of the patient were reviewed by me and considered in my medical decision making (see chart for details).  Clinical Course    5:58 AM Pt brought over from the pediatric ER where her children are staying and she is clearly psychotic She was given ativan/haldol She is more calm/cooperative An IVC has been initiated Suspect this is drug induced (though it does not appear she has been poisoned) as this is likely due to ETOH and possibly amphetamines 6:27 AM PT IS MORE CALM/COOPERATIVE HER HEART RATE IS IMPROVED - HEART RATE 110 SHE REPORTS BEING POISONED - THERE ARE NO SIGNS OF POISONING BY OTHER INDIVIDUALS, BUT I SUSPECT HER CLINICAL PRESENTATION AT THIS TIME IS DUE TO ETOH WITHDRAWAL AND ALSO DRUG ABUSE.  SHE WAS FOUND WITH UNKNOWN PILLS ON ARRIVAL AND ALSO HAD EMPTY LIQUOR BOTTLES. PER RECORDS SHE HAS H/O BIPOLAR AND I SUSPECT THIS PLAYING A ROLE IN HER PSYCHOSIS I HAVE CONSULTED PSYCHIATRY CIWAA PROTOCOL HAS BEEN INITIATED  Final Clinical Impressions(s) / ED Diagnoses   Final diagnoses:  Psychosis, unspecified psychosis type    New Prescriptions New Prescriptions   No medications on file     Zadie Rhineonald Karia Ehresman, MD 01/18/16 [phone]

## 2016-01-18 NOTE — ED Notes (Signed)
Patient was given a drink, and a regular diet was ordered for lunch.

## 2016-01-18 NOTE — ED Provider Notes (Signed)
Patient wheeled from the pediatric room to the adult side by myself. Her 7 mo son is here for evaluation of potentially being poisoned. The patient was here with her kids earlier on 10/25 for the same thing CPS was contacted as well as social work and the family was discharged. When EMS went to the call, the mom said that she is afraid to feed her kids because they might be poisoned and is afraid to put clothes on them. EMS said that the 7 mo was naked and had to be warmed prior to transport. The children are unkempt and the 7 mo appears malnourished. Mom became agitated to exam room and believes the purse and liter coke bottles that she has with her are poisoned, she began hyperventilated and running up and down the pediatric hallway. At this time, mom was encouraged to sit in a wheel chair and taken to the adult side for evaluation. [employer] are in peds with the children and will contact CPS.   The patient will need medical clearance and then if medically cleared a psychiatric evaluation. [employer] reports that she has an active warrant for her arrest and the patient is unaware of this. If she is to be discharged, notify [employer] and the patient will be arrested and taken to jail, per [employer].       Marlon Peliffany Donovyn Guidice, PA-C 01/18/16 0331    Marlon Peliffany Peregrine Nolt, PA-C 01/18/16 [id number]    Zadie Rhineonald Wickline, MD 01/18/16 0600

## 2016-01-18 NOTE — ED Notes (Signed)
CPS at bedside.

## 2016-01-18 NOTE — ED Notes (Signed)
Sitter at bedside.

## 2016-01-18 NOTE — ED Notes (Addendum)
Pt escorted to A10 by [employer] on stretcher after she took off running out of emergency dept. Pt screaming out "he's going to kill my children and its going to be all your fault". Pt states "he" has been poisoning her and her children and that her social worker told her to look out for this. Pt then asked "do you smell that? What is that smell if its not poison?" Pt sweating profusely and was incontinent of urine x 1. NT at bedside for 1:1. Pt hands cuffed behind her at this time and [employer] at bedside. Dr. Bebe ShaggyWickline at bedside.

## 2016-01-18 NOTE — ED Notes (Signed)
Pt becoming agitated, pacing around room stating "I'm about to flip out."  Kohut MD made aware and further orders received.

## 2016-01-18 NOTE — ED Notes (Signed)
Pt to nurses desk to use phone

## 2016-01-19 DIAGNOSIS — R451 Restlessness and agitation: Secondary | ICD-10-CM | POA: Diagnosis not present

## 2016-01-19 DIAGNOSIS — Z880 Allergy status to penicillin: Secondary | ICD-10-CM

## 2016-01-19 DIAGNOSIS — Z79899 Other long term (current) drug therapy: Secondary | ICD-10-CM

## 2016-01-19 DIAGNOSIS — Z818 Family history of other mental and behavioral disorders: Secondary | ICD-10-CM

## 2016-01-19 DIAGNOSIS — F22 Delusional disorders: Secondary | ICD-10-CM | POA: Diagnosis not present

## 2016-01-19 DIAGNOSIS — Z811 Family history of alcohol abuse and dependence: Secondary | ICD-10-CM | POA: Diagnosis not present

## 2016-01-19 DIAGNOSIS — Z806 Family history of leukemia: Secondary | ICD-10-CM | POA: Diagnosis not present

## 2016-01-19 DIAGNOSIS — Z800 Family history of malignant neoplasm of digestive organs: Secondary | ICD-10-CM

## 2016-01-19 DIAGNOSIS — Z833 Family history of diabetes mellitus: Secondary | ICD-10-CM

## 2016-01-19 MED ORDER — LORAZEPAM 1 MG PO TABS
1.0000 mg | ORAL_TABLET | Freq: Once | ORAL | Status: AC | PRN
  Administered 2016-01-19: 1 mg via ORAL
  Filled 2016-01-19: qty 1

## 2016-01-19 MED ORDER — BUPRENORPHINE HCL 8 MG SL SUBL
8.0000 mg | SUBLINGUAL_TABLET | Freq: Every day | SUBLINGUAL | Status: DC
  Administered 2016-01-19: 8 mg via SUBLINGUAL
  Filled 2016-01-19: qty 1

## 2016-01-19 NOTE — ED Notes (Signed)
Pt became tearful while talking on the phone. Sitter escorted pt back to room after phone call was done.

## 2016-01-19 NOTE — ED Notes (Signed)
Meal tray ordered 

## 2016-01-19 NOTE — ED Notes (Signed)
Pt at nurse desk, upset about not getting her suboxone. Pt arguing with RN about her daily meds stating if she doesn't receive them she will go into withdrawal. Pt inquiring about what withdrawal protocols will be followed due to her not receiving her suboxone. Pt informed by RN that she is here and being treated for her mental health and is not in withdrawal nor being treated for withdrawal. Pt growing increasingly agitated and is asked to return to her room. Security called to standby.

## 2016-01-19 NOTE — ED Notes (Addendum)
Pt became tearful after using phone. Pt stating "I cannot get in touch with my children's father and cannot talk to my babies. I haven't seen them in days." Pt asking about plan of care and RN informed pt that she did get accepted at [facility]. Pt became anxious and as stating " I don't understand why I am going there, I'm not crazy, I just came here to get tested because I was sick." RN informed that the MD thinks this is what is safest for the pt. Pt given ativan because CIWA was an 7. Pt in bed and talking to sitter.

## 2016-01-19 NOTE — ED Notes (Signed)
Dinner tray at bedside

## 2016-01-19 NOTE — ED Notes (Signed)
MD gave verbal order to give pt Seroquel and BP medication prior to her transfer [facility].

## 2016-01-19 NOTE — ED Notes (Signed)
Placed telecommunicator in pts room.

## 2016-01-19 NOTE — ED Notes (Signed)
Pt. requested Suboxone , nurse explained to pt. that Leanor RubensteinSubone is not part of her treatment regimen at this time , given prn Ativan for her anxiety / mild agitation .

## 2016-01-19 NOTE — ED Notes (Signed)
Pt using phone at nursing station.

## 2016-01-19 NOTE — ED Notes (Signed)
Pt sleeping. 

## 2016-01-19 NOTE — ED Notes (Signed)
Pt become tearful about not being able to see her children. RN informed her that visiting hours were over and that she had already had her 2 phone calls today. Pt verbalized understanding, but continued to cry.

## 2016-01-19 NOTE — Progress Notes (Signed)
Spoke with Albin Fellingarla at [facility] and initiated new referral (initial referral sent yesterday).  No other referral options identified today.  Ilean SkillMeghan Elisavet Buehrer, MSW, LCSW Clinical Social Work, Disposition  01/19/2016 [phone]

## 2016-01-19 NOTE — ED Notes (Signed)
Pt using phone at nursing station.  

## 2016-01-19 NOTE — ED Notes (Signed)
[facility]. Pt can be accepted after 7:30pm.

## 2016-01-19 NOTE — ED Notes (Signed)
Lunch at bedside 

## 2016-01-19 NOTE — ED Notes (Signed)
Breakfast ordered 

## 2016-01-19 NOTE — ED Notes (Signed)
Pt given rice crispy treat and sprite

## 2016-01-19 NOTE — ED Notes (Signed)
Pts family member called and asked RN if he could bring pts home prescription of suboxone to her. RN informed family member that pt could not have home medications brought to her while she is in the hospital. Family member verbalized that he understood that the medications could not be brought to the hospital. RN informed family member of visiting hours.

## 2016-01-19 NOTE — ED Notes (Signed)
Pt asleep and snoring at this time.  Will keep Ativan order if pt unable to fall back asleep.

## 2016-01-19 NOTE — ED Notes (Addendum)
Pt requesting Suboxone stating she has been without it for 3 days and will get deathly sick without it. RN verbally informing pt no  Suboxone will be given per ER MD. Pt visibly agitated. Pt called someone requesting person on phone to bring her Suboxone. Ativan given to pt at this time by RN.

## 2016-01-19 NOTE — ED Notes (Signed)
Patient resting at this time, patient denies any headache, tingling,hallacnations.

## 2016-01-19 NOTE — ED Notes (Signed)
Dinner order changed per pt's request

## 2016-01-19 NOTE — BH Assessment (Addendum)
Patient has been accepted to [facility].  Accepting physician is Dr. Jennet MaduroPucilowska.  Attending Physician will be Dr. Jennet MaduroPucilowska.  Patient has been assigned to room 313, by [facility] Charge Nurse [place].  Call report to [phone].  Representative/Transfer Coordinator is Dyanne IhaCalvin  [facility] Staff Lillia Abed([name], [facility] made aware of acceptance. Patient Pre-admitted by Patient Access ([place]).

## 2016-01-19 NOTE — ED Notes (Signed)
Pt given graham crackers, peanut butter, and sprite 

## 2016-01-19 NOTE — ED Notes (Signed)
Attempted to contact Sheriffs department to get an estimated p/u time. No answer, voicemail was left.

## 2016-01-19 NOTE — ED Notes (Signed)
Called Sheriff's dept to have [facility] per nurse Jazmine.

## 2016-01-19 NOTE — Consult Note (Signed)
Telepsych Consultation   Reason for Consult:  Agitation and Paranoia Referring Physician:  EDP Patient Identification: Megan Edwards MRN:  [id number] Principal Diagnosis: <principal problem not specified> Diagnosis:   Patient Active Problem List   Diagnosis Date Noted  . Syncope and collapse [R55]   . Chronic pain syndrome [G89.4]   . Morbid obesity due to excess calories ([place]) [E66.01]   . Moderate mixed bipolar I disorder ([place]) [F31.62]   . AKI (acute kidney injury) ([place]) [N17.9] 10/21/2015  . Arterial hypotension [I95.9] 10/21/2015  . SIRS with acute organ dysfunction due to infectious process ([place]) [A41.9, R65.20] 10/21/2015  . Acute respiratory failure with hypoxia ([place]) [J96.01]   . Altered mental status [R41.82] 08/13/2015  . Alcohol intoxication ([place]) [F10.929] 08/12/2015  . Acute hypoxemic respiratory failure ([place]) [J96.01]   . NSVD (normal spontaneous vaginal delivery) [O80] 06/13/2015  . Normal labor [O80, Z37.9] 06/11/2015  . History of postpartum hemorrhage, currently pregnant [O09.299] 05/11/2015  . Immediate postpartum hemorrhage, with delivery [O72.1] 11/28/2012  . Pregnancy induced hypertension [O13.9] 11/25/2012  . Elderly primigravida, antepartum [O09.519] 06/26/2012  . Polysubstance (including opioids) dependence with physiol dependence ([place]) [F19.20] 08/11/2011    Total Time spent with patient: 30 minutes  Subjective:   Megan Edwards is a 40 y.o. female patient admitted with paranoid psychosis. Pt states "my mother and the father of my baby are trying to poison me and the kids." Pt stated she has been sick for 6 months and the kids have been sick with breathing problems recently. Pt stated her mother has been cooking for her and when she eats the food her mother gives her she gets sick.   HPI: Per tele assessment on 01/18/2016 written by Marisa Cyphers, [facility] Counselor [place] Jansky Royal is a 40 y.o. female who presents to [facility] under IVC completed by EDP  due to psychosis. Pt shares she has a 40 yr old and a 47 month old. Pt denies SI & HI, but endorses having some AVH 4 days ago when she ate a cheesecake that her mother made her. Pt was unclear on what she saw, but pt indicated that she heard ringing in her ears. Pt shares she has not slept in 4 days, since eating that cheesecake. Pt was able to answer questions appropriately. When asked what occurred for her to present to the hospital, pt launched into an involved story about the father of the 57 month old, Consuello Masse, and how she believes he is trying to poison and/or kill her and the children. Pt indicates that he has given her a car to drive and the fumes in the car are coming out into the car and everytime she drives it, the 14 month old stops breathing. Pt also reports that FOB is a Dance movement psychotherapist and he has gotten into all of her information-erasing her information from the [facility], as if she's never been there; intercepting texts between her and agencies where she gets help from; looking at her as she looks at her computer. Pt also indicates that FOB has been inside her home w/out her knowledge, b/c she has found half used bottles of boric acid, WD40, wood glue, and other things that she hadn't put there. Pt adds that when she calls the police, he conveniently removes the items. Pt also alludes to the fact that she believes her mom and FOB are working together against her.   Today during tele psych consult:  Megan Edwards is a 40 y.o.  female who presents to [facility] under IVC completed by EDP due to psychosis. Pt denies suicidal/homicidal ideation, denies auditory/visual hallucinations and does not appear to be responding to internal stimuli. Pt was alert & oriented x 3, calm and cooperative, dressed in paper scrubs, and sitting on the bedside. When asked why she was at the hospital, pt responded "my kids were sick and so was I and I believe someone is trying to poison [place]. I tried to find out  where we could get tested to see if we are being poisoned but nobody could tell me where to go so I came to the [facility]." Pt stated she thinks her mother and father of her baby are trying to poison her and the kids because they want to take the kids from her. Pt states "my mother has called CPS twice and both times there were no findings and she doesn't like that. She is cooking for me and everything she gives me makes me sick immediately."  Pt stated she does not know why she is still at the hospital. Pt was speaking circumstantially and with rapid speech. Pt stated she has been on Suboxone and attends Individual and group therapy for the past four months, so she can prove she is staying clean and sober. Pt states "I have never had a bad drug test until I ate the cheesecake my mother gave me and them I tested positive for Ritalin." Pt stated she does not have a prescription for Ritalin and that her CPS case worker said the test was a false positive. According to the tele assessment note from 01/18/2016 The CPS in home case worker Abelardo Diesel reported that this is not the pt's baseline and she tested positive for amphetamines which were from a diet pill prescribed by a bariatric doctor. Jinny Blossom also stated that the pt has never expressed any concerns that the father of the baby is trying to poison her.   Discussed case with Dr Dwyane Dee who agrees that pt would benefit from inpatient psychiatric admission for crisis stabilization and medication management.   Past Psychiatric History: BiPolar 1  Risk to Self: Suicidal Ideation: No Suicidal Intent: No Is patient at risk for suicide?: No Suicidal Plan?: No Access to Means: No What has been your use of drugs/alcohol within the last 12 months?: see above Intentional Self Injurious Behavior: None Risk to Others: Homicidal Ideation: No Thoughts of Harm to Others: No Current Homicidal Intent: No Current Homicidal Plan: No Access to Homicidal Means: No History of  harm to others?: No Assessment of Violence: None Noted Does patient have access to weapons?: No Criminal Charges Pending?: No Does patient have a court date: No Prior Inpatient Therapy: Prior Inpatient Therapy: Yes Prior Therapy Dates: multiple dates-most recent in 2013 Prior Therapy Facilty/Provider(s): [facility] Reason for Treatment: polysubstance use Prior Outpatient Therapy: Prior Outpatient Therapy: No Does patient have an ACCT team?: No Does patient have Intensive In-House Services?  : No Does patient have [facility] services? : No Does patient have P4CC services?: No  Past Medical History:  Past Medical History:  Diagnosis Date  . Abnormal Pap smear   . Bipolar 1 disorder ([place])   . Depression   . Genital herpes 2000  . Hypertension   . Infection    UTI  . Ovarian cyst   . SVT (supraventricular tachycardia) (H[facility])     Past Surgical History:  Procedure Laterality Date  . addenoidectomy    . BREAST ENHANCEMENT SURGERY    .  BREAST SURGERY    . KNEE SURGERY    . KNEE SURGERY Left   . TONSILLECTOMY    . WISDOM TOOTH EXTRACTION     Family History:  Family History  Problem Relation Age of Onset  . Alcoholism    . Leukemia    . Cervical cancer    . Bone cancer    . Cirrhosis    . Diabetes Mellitus II    . Depression    . Hypertension    . Thyroid cancer     Family Psychiatric  History: Alcoholism, Depression Social History:  History  Alcohol Use  . 0.0 oz/week    Comment: 4 40's beer daily- not currently     History  Drug Use No    Comment: former    Social History   Social History  . Marital status: Divorced    Spouse name: N/A  . Number of children: N/A  . Years of education: N/A   Occupational History  . [employer]   Social History Main Topics  . Smoking status: Never Smoker  . Smokeless tobacco: Never Used  . Alcohol use 0.0 oz/week     Comment: 4 40's beer daily- not currently  . Drug use: No     Comment: former  . Sexual activity:  Not Currently   Other Topics Concern  . None   Social History Narrative  . None   Additional Social History:    Allergies:   Allergies  Allergen Reactions  . Other Other (See Comments)    Pt does not take narcotics, is on Suboxone.   Marland Kitchen Penicillins Rash and Other (See Comments)    Has patient had a PCN reaction causing immediate rash, facial/tongue/throat swelling, SOB or lightheadedness with hypotension: Yes Has patient had a PCN reaction causing severe rash involving mucus membranes or skin necrosis: No Has patient had a PCN reaction that required hospitalization No Has patient had a PCN reaction occurring within the last 10 years: No If all of the above answers are "NO", then may proceed with Cephalosporin use.    Labs:  Results for orders placed or performed during the hospital encounter of 01/18/16 (from the past 48 hour(s))  CBC with Differential/Platelet     Status: Abnormal   Collection Time: 01/18/16  2:44 AM  Result Value Ref Range   WBC 9.6 4.0 - 10.5 K/uL   RBC 4.73 3.87 - 5.11 MIL/uL   Hemoglobin 13.8 12.0 - 15.0 g/dL   HCT 40.0 36.0 - 46.0 %   MCV 84.6 78.0 - 100.0 fL   MCH 29.2 26.0 - 34.0 pg   MCHC 34.5 30.0 - 36.0 g/dL   RDW 14.2 11.5 - 15.5 %   Platelets 337 150 - 400 K/uL   Neutrophils Relative % 84 %   Neutro Abs 8.1 (H) 1.7 - 7.7 K/uL   Lymphocytes Relative 11 %   Lymphs Abs 1.0 0.7 - 4.0 K/uL   Monocytes Relative 5 %   Monocytes Absolute 0.5 0.1 - 1.0 K/uL   Eosinophils Relative 0 %   Eosinophils Absolute 0.0 0.0 - 0.7 K/uL   Basophils Relative 0 %   Basophils Absolute 0.0 0.0 - 0.1 K/uL  Comprehensive metabolic panel     Status: Abnormal   Collection Time: 01/18/16  2:44 AM  Result Value Ref Range   Sodium 140 135 - 145 mmol/L   Potassium 3.7 3.5 - 5.1 mmol/L   Chloride 102 101 - 111 mmol/L  CO2 26 22 - 32 mmol/L   Glucose, Bld 127 (H) 65 - 99 mg/dL   BUN 12 6 - 20 mg/dL   Creatinine, Ser 2.99 (H) 0.44 - 1.00 mg/dL   Calcium 9.9 8.9 -  07.9 mg/dL   Total Protein 8.4 (H) 6.5 - 8.1 g/dL   Albumin 4.8 3.5 - 5.0 g/dL   AST 61 (H) 15 - 41 U/L   ALT 57 (H) 14 - 54 U/L   Alkaline Phosphatase 87 38 - 126 U/L   Total Bilirubin 0.9 0.3 - 1.2 mg/dL   GFR calc non Af Amer >60 >60 mL/min   GFR calc Af Amer >60 >60 mL/min    Comment: (NOTE) The eGFR has been calculated using the CKD EPI equation. This calculation has not been validated in all clinical situations. eGFR's persistently <60 mL/min signify possible Chronic Kidney Disease.    Anion gap 12 5 - 15  Ethanol     Status: None   Collection Time: 01/18/16  2:44 AM  Result Value Ref Range   Alcohol, Ethyl (B) <5 <5 mg/dL    Comment:        LOWEST DETECTABLE LIMIT FOR SERUM ALCOHOL IS 5 mg/dL FOR MEDICAL PURPOSES ONLY   Rapid urine drug screen (hospital performed)     Status: Abnormal   Collection Time: 01/18/16  3:27 AM  Result Value Ref Range   Opiates NONE DETECTED NONE DETECTED   Cocaine NONE DETECTED NONE DETECTED   Benzodiazepines NONE DETECTED NONE DETECTED   Amphetamines POSITIVE (A) NONE DETECTED   Tetrahydrocannabinol NONE DETECTED NONE DETECTED   Barbiturates NONE DETECTED NONE DETECTED    Comment:        DRUG SCREEN FOR MEDICAL PURPOSES ONLY.  IF CONFIRMATION IS NEEDED FOR ANY PURPOSE, NOTIFY LAB WITHIN 5 DAYS.        LOWEST DETECTABLE LIMITS FOR URINE DRUG SCREEN Drug Class       Cutoff (ng/mL) Amphetamine      1000 Barbiturate      200 Benzodiazepine   200 Tricyclics       300 Opiates          300 Cocaine          300 THC              50   Pregnancy, urine     Status: None   Collection Time: 01/18/16  3:56 AM  Result Value Ref Range   Preg Test, Ur NEGATIVE NEGATIVE    Comment:        THE SENSITIVITY OF THIS METHODOLOGY IS >20 mIU/mL.     Current Facility-Administered Medications  Medication Dose Route Frequency Provider Last Rate Last Dose  . buprenorphine (SUBUTEX) sublingual tablet 8 mg  8 mg Sublingual Daily Marily Memos, MD       . labetalol (NORMODYNE) tablet 50 mg  50 mg Oral BID Zadie Rhine, MD   50 mg at 01/19/16 3089  . LORazepam (ATIVAN) tablet 0-4 mg  0-4 mg Oral Q6H Zadie Rhine, MD   Stopped at 01/18/16 1200   Followed by  . [START ON 01/20/2016] LORazepam (ATIVAN) tablet 0-4 mg  0-4 mg Oral Q12H Zadie Rhine, MD      . QUEtiapine (SEROQUEL) tablet 100 mg  100 mg Oral BID Zadie Rhine, MD   100 mg at 01/19/16 7817  . ziprasidone (GEODON) injection 20 mg  20 mg Intramuscular Once PRN Raeford Razor, MD       Current Outpatient  Prescriptions  Medication Sig Dispense Refill  . BIOTIN PO Take 1 tablet by mouth 2 (two) times daily.     . buprenorphine-naloxone (SUBOXONE) 2-0.5 MG SUBL SL tablet Place 1 tablet under the tongue every evening.     . clindamycin (CLEOCIN) 300 MG capsule Take 1 capsule (300 mg total) by mouth every 6 (six) hours. (Patient not taking: Reported on 01/18/2016) 40 capsule 0  . labetalol (NORMODYNE) 100 MG tablet Take 1 tablet (100 mg total) by mouth 2 (two) times daily. (Patient not taking: Reported on 01/18/2016) 60 tablet 1  . labetalol (NORMODYNE) 200 MG tablet Take 200 mg by mouth 3 (three) times daily.    . medroxyPROGESTERone (PROVERA) 10 MG tablet Take 1 tablet (10 mg total) by mouth daily. 10 tablet 2  . QUEtiapine (SEROQUEL) 100 MG tablet Take 1 tablet (100 mg total) by mouth 3 (three) times daily. (Patient taking differently: Take 100 mg by mouth daily. )    . SUBOXONE 8-2 MG FILM Place 2.5 Film under the tongue daily.   0  . valACYclovir (VALTREX) 1000 MG tablet Take 1 tablet (1,000 mg total) by mouth as needed (with flare ups). 30 tablet 3    Musculoskeletal: Unable to assess; camera  Psychiatric Specialty Exam: Physical Exam  Review of Systems  Psychiatric/Behavioral: Positive for depression, hallucinations (paranoid thoughts about being poisoned) and substance abuse. Negative for memory loss and suicidal ideas. The patient is not nervous/anxious and does not  have insomnia.   All other systems reviewed and are negative.   Blood pressure (!) 130/102, pulse 98, temperature 98.1 F (36.7 C), resp. rate 18, height '5\' 7"'$  (1.702 m), SpO2 99 %, not currently breastfeeding.There is no height or weight on file to calculate BMI.  General Appearance: Casual and Fairly Groomed  Eye Contact:  Good  Speech:  Clear and Coherent, rapid  Volume:  Normal  Mood:  Anxious and Depressed  Affect:  Non-Congruent  Thought Process:  Disorganized, circumstantial  Orientation:  Full (Time, Place, and Person)  Thought Content:  Illogical and Hallucinations: Paranoid thoughts   Suicidal Thoughts:  No, denies  Homicidal Thoughts:  No, denies  Memory:  Immediate;   Good Recent;   Good Remote;   Fair  Judgement:  Poor  Insight:  Lacking  Psychomotor Activity:  Increased  Concentration:  Concentration: Fair and Attention Span: Fair  Recall:  Poor  Fund of Knowledge:  Fair  Language:  Good  Akathisia:  No  Handed:  Right  AIMS (if indicated):     Assets:  Communication Skills Desire for Improvement Financial Resources/Insurance Housing Resilience Transportation  ADL's:  Intact  Cognition:  WNL  Sleep:   fair     Treatment Plan Summary: Daily contact with patient to assess and evaluate symptoms and progress in treatment, Medication management and progression in treatment.  Disposition: Recommend psychiatric Inpatient admission when medically cleared.  Ethelene Hal, NP 01/19/2016 10:49 AM

## 2016-01-19 NOTE — ED Notes (Signed)
Pt called out asking about her medications.   Pt asking why she is not being given Siboxone while under our care.  Pt also asking for ativan to "help me sleep".  Pt has been sleeping intermittently through-out the day, no tossing or turning in bed, able to stay asleep for several hours at a time.  Spoke to Dr. Mora Bellmanni who will order one time dose.

## 2016-01-19 NOTE — ED Notes (Signed)
Pt asking RN about suboxone. RN informed pt that it was not ordered. Pt irritable and stating "I will go into withdrawals without it." RN informed pt that she was here for her mental health and not withdrawal. Pt continued to eat breakfast after RN left room.

## 2016-01-20 ENCOUNTER — Encounter: Payer: Self-pay | Admitting: Psychiatry

## 2016-01-20 ENCOUNTER — Inpatient Hospital Stay
Admission: RE | Admit: 2016-01-20 | Discharge: 2016-01-23 | DRG: 885 | Disposition: A | Discharge status: Home / Self Care | Payer: Medicaid Other | Source: Intra-hospital | Attending: Psychiatry | Admitting: Psychiatry

## 2016-01-20 DIAGNOSIS — F1520 Other stimulant dependence, uncomplicated: Secondary | ICD-10-CM | POA: Diagnosis present

## 2016-01-20 DIAGNOSIS — Z6834 Body mass index (BMI) 34.0-34.9, adult: Secondary | ICD-10-CM

## 2016-01-20 DIAGNOSIS — G4700 Insomnia, unspecified: Secondary | ICD-10-CM | POA: Diagnosis present

## 2016-01-20 DIAGNOSIS — F1120 Opioid dependence, uncomplicated: Secondary | ICD-10-CM | POA: Diagnosis present

## 2016-01-20 DIAGNOSIS — F3162 Bipolar disorder, current episode mixed, moderate: Secondary | ICD-10-CM | POA: Diagnosis present

## 2016-01-20 DIAGNOSIS — I10 Essential (primary) hypertension: Secondary | ICD-10-CM | POA: Diagnosis present

## 2016-01-20 DIAGNOSIS — O161 Unspecified maternal hypertension, first trimester: Secondary | ICD-10-CM

## 2016-01-20 DIAGNOSIS — G894 Chronic pain syndrome: Secondary | ICD-10-CM | POA: Diagnosis present

## 2016-01-20 DIAGNOSIS — F1111 Opioid abuse, in remission: Secondary | ICD-10-CM | POA: Diagnosis present

## 2016-01-20 DIAGNOSIS — F312 Bipolar disorder, current episode manic severe with psychotic features: Principal | ICD-10-CM | POA: Diagnosis present

## 2016-01-20 DIAGNOSIS — Z880 Allergy status to penicillin: Secondary | ICD-10-CM

## 2016-01-20 MED ORDER — MEDROXYPROGESTERONE ACETATE 10 MG PO TABS
10.0000 mg | ORAL_TABLET | Freq: Every day | ORAL | Status: DC
  Administered 2016-01-22 – 2016-01-23 (×2): 10 mg via ORAL
  Filled 2016-01-20 (×3): qty 1

## 2016-01-20 MED ORDER — LABETALOL HCL 100 MG PO TABS
100.0000 mg | ORAL_TABLET | Freq: Two times a day (BID) | ORAL | Status: DC
  Administered 2016-01-20 – 2016-01-23 (×6): 100 mg via ORAL
  Filled 2016-01-20 (×6): qty 1

## 2016-01-20 MED ORDER — LABETALOL HCL 200 MG PO TABS: 200.0000 mg | ORAL_TABLET | Freq: Three times a day (TID) | ORAL | Status: DC

## 2016-01-20 MED ORDER — VALACYCLOVIR HCL 500 MG PO TABS
1000.0000 mg | ORAL_TABLET | ORAL | Status: DC | PRN
  Administered 2016-01-21: 1000 mg via ORAL
  Filled 2016-01-20 (×2): qty 2

## 2016-01-20 MED ORDER — ACETAMINOPHEN 325 MG PO TABS: 650.0000 mg | ORAL_TABLET | Freq: Four times a day (QID) | ORAL | Status: DC | PRN

## 2016-01-20 MED ORDER — BUPRENORPHINE HCL 2 MG SL SUBL
20.0000 mg | SUBLINGUAL_TABLET | Freq: Every day | SUBLINGUAL | Status: DC
  Administered 2016-01-20 – 2016-01-23 (×4): 20 mg via SUBLINGUAL
  Filled 2016-01-20 (×5): qty 2

## 2016-01-20 MED ORDER — TRAZODONE HCL 50 MG PO TABS
50.0000 mg | ORAL_TABLET | Freq: Every evening | ORAL | Status: DC | PRN
  Administered 2016-01-20 – 2016-01-21 (×2): 50 mg via ORAL
  Filled 2016-01-20 (×2): qty 1

## 2016-01-20 MED ORDER — ALUM & MAG HYDROXIDE-SIMETH 200-200-20 MG/5ML PO SUSP: 30.0000 mL | ORAL | Status: DC | PRN

## 2016-01-20 MED ORDER — QUETIAPINE FUMARATE 200 MG PO TABS
200.0000 mg | ORAL_TABLET | Freq: Three times a day (TID) | ORAL | Status: DC
  Administered 2016-01-20 – 2016-01-23 (×9): 200 mg via ORAL
  Filled 2016-01-20 (×9): qty 1

## 2016-01-20 MED ORDER — QUETIAPINE FUMARATE 100 MG PO TABS
100.0000 mg | ORAL_TABLET | Freq: Three times a day (TID) | ORAL | Status: DC
  Administered 2016-01-20: 100 mg via ORAL
  Filled 2016-01-20 (×2): qty 1

## 2016-01-20 MED ORDER — MAGNESIUM HYDROXIDE 400 MG/5ML PO SUSP: 30.0000 mL | Freq: Every day | ORAL | Status: DC | PRN

## 2016-01-20 NOTE — Plan of Care (Signed)
Problem: Education: Goal: Will be free of psychotic symptoms Outcome: Progressing Pt continues to be delusional, disorganized with thoughts. States, "i'm here over a custody battle, over child support." Makes statements regarding being poisoned, "things making their way out of my body."

## 2016-01-20 NOTE — Progress Notes (Signed)
Pt to the medication room with a big, fixed smile, euphoric reporting that "the toxins are leaving my body through my legs, finally!"

## 2016-01-20 NOTE — Progress Notes (Signed)
Patient was tired upon admission. Went to bed and has been sleeping. No sign of distress. Safety precautions initiated and maintained.

## 2016-01-20 NOTE — Progress Notes (Signed)
[facility] [address], [place], [place] [phone]  Pt was prescribed Suboxone 8-2mg  #18 on January 16, 2016. Prescription is for Suboxone 8-2mg  two and one half films under the tongue once daily.

## 2016-01-20 NOTE — Tx Team (Signed)
Initial Treatment Plan 01/20/2016 4:23 AM Megan Edwards UJW:[id number]    PATIENT STRESSORS: Legal issue Substance abuse   PATIENT STRENGTHS: Licensed conveyancerCommunication skills Financial means Supportive family/friends   PATIENT IDENTIFIED PROBLEMS: psychosis      "Substance abuse"    "need medications"           DISCHARGE CRITERIA:  Improved stabilization in mood, thinking, and/or behavior  PRELIMINARY DISCHARGE PLAN: Outpatient therapy  PATIENT/FAMILY INVOLVEMENT: This treatment plan has been presented to and reviewed with the patient, Megan HalstedKelly L Edwards, and/or family member, .  The patient and family have been given the opportunity to ask questions and make suggestions.  Olin PiaByungura, Londen Lorge, RN 01/20/2016, 4:23 AM
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
## 2016-01-20 NOTE — Plan of Care (Signed)
Problem: Education: Goal: Knowledge of the prescribed therapeutic regimen will improve Outcome: Progressing Was inquisitive about her medications. More focused on sleep medications.

## 2016-01-20 NOTE — [facility] Suicide Risk Assessment (Signed)
[facility] INPATIENT:  Family/Significant Other Suicide Prevention Education  Suicide Prevention Education:  Patient Refusal for Family/Significant Other Suicide Prevention Education: The patient Megan Edwards has refused to provide written consent for family/significant other to be provided Family/Significant Other Suicide Prevention Education during admission and/or prior to discharge.  Physician notified.  Kiela Shisler G. Garnette CzechSampson MSW, LCSWA 01/20/2016, 3:38 PM

## 2016-01-20 NOTE — Progress Notes (Signed)
Pt awake, alert, up on unit today. Appears bright, cooperative, calm. Requested suboxone multiple times this morning, subutex ordered and administered. States, "i'm here because of a custody battle, over child support. I have a 10742 year old and a 766 month old child. I have custody and I need a lot of child support." First dose of subutex administered as ordered (see MAR), pt requested to dissolve the tablets underneath her tongue, then complained "these pills taste different." Pt stayed at med room nurse's desk until she felt better. No physical symptoms noted, just the report that the pills "tasted different" and pt was holding her throat. Breathing even, unlabored. No signs of swelling of her throat. Nurse gave juice to wash the taste away, but pt still complains. Allowed pt to go back to dayroom to finish filling out menu. Pt came back to nurse's station a few minutes later claiming that "i am fine. I don't know what's going on, maybe just everything I'm taking mixing together and the bad stuff trying to leave my body...." Pt's thoughts are disorganized, delusional at times. Medication complaint.   Support and encouragement provided with use of therapeutic communication. Medications administered as ordered with education. Safety maintained with every 15 minute checks. Will continue to monitor.

## 2016-01-20 NOTE — [facility] Group Notes (Signed)
[facility] LCSW Group Therapy  01/20/2016 2:24 PM  Type of Therapy:  Group Therapy  Participation Level:  Active  Participation Quality:  Attentive and Sharing  Affect:  Anxious  Cognitive:  Appropriate  Insight:  Limited  Engagement in Therapy:  Limited  Modes of Intervention:  Discussion, Education, Reality Testing and Support  Summary of Progress/Problems:Coping Skills: Patients defined and discussed healthy coping skills. Patients identified healthy coping skills they would like to try during hospitalization and after discharge. CSW offered insight to varying coping skills that may have been new to patients such as practicing mindfulness.  Megan Edwards G. Garnette CzechSampson MSW, LCSWA 01/20/2016, 2:25 PM
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
## 2016-01-21 ENCOUNTER — Encounter: Payer: Self-pay | Admitting: Psychiatry

## 2016-01-21 DIAGNOSIS — F1111 Opioid abuse, in remission: Secondary | ICD-10-CM | POA: Diagnosis present

## 2016-01-21 DIAGNOSIS — F1520 Other stimulant dependence, uncomplicated: Secondary | ICD-10-CM | POA: Diagnosis present

## 2016-01-21 LAB — GLUCOSE, CAPILLARY: GLUCOSE-CAPILLARY: 112 mg/dL — AB (ref 65–99)

## 2016-01-21 NOTE — Progress Notes (Signed)
Pt alert and oriented, up on unit today. Sleeping in room most of the day, only coming out for meals/medications. Did not attend group. Continues to be paranoid, reporting "toxins leaving my body through my feet." Informs nurse several times today that "my feet are burning, I think I might be diabetic." Appears anxious, smiles on interaction. Showered today. Denies SI/HI/AVH. Medication compliant.   Support and encouragement provided with use of therapeutic communication. Medications administered as ordered with education. Safety maintained with every 15 minute checks. Will continue to monitor.

## 2016-01-21 NOTE — Plan of Care (Signed)
Problem: Coping: Goal: Ability to verbalize feelings will improve Outcome: Progressing Pt verbal and comes forth with all concerns. Today she states "toxins are leaving her body though her feet and legs. My feet are burning, I think I might be diabetic." Cooperative and easily redirected.

## 2016-01-21 NOTE — Plan of Care (Signed)
Problem: Activity: Goal: Will verbalize the importance of balancing activity with adequate rest periods Outcome: Progressing Pt reported "i'm feeling better everyday. I sleep good at night."   Problem: Education: Goal: Will be free of psychotic symptoms Outcome: Not Progressing Pt is still disorganized and delusional stating "the toxins were coming out of my body through my sore yesterday, but today they're coming out of my body through my feet. I can see it."  Problem: Coping: Goal: Ability to cope will improve Outcome: Progressing Pt stated "i've had a great day today. I'm optimistic and I write down my goals."  Problem: Safety: Goal: Ability to remain free from injury will improve Outcome: Progressing Pt has remained free from injury.

## 2016-01-21 NOTE — BHH Group Notes (Signed)
B[facility] LCSW Group Therapy  01/21/2016 2:25 PM  Type of Therapy:  Group Therapy  Participation Level:  Patient did not attend group. CSW invited patient to group.   Summary of Progress/Problems:Self esteem: Patients discussed self esteem and how it impacts them. They discussed what aspects in their lives has influenced their self esteem. They were challenged to identify changes that are needed in order to improve self esteem. Patients participated in activity where they had to identify positive adjectives they felt described their personality. Patients shared with the group on the following areas: Things I am good at, What I like about my appearance, I've helped others by, What I value the most, compliments I have received, challenges I have overcome, thing that make me unique, and Times I've made others happy.    Mary-Ann Pennella G. Garnette CzechSampson MSW, LCSWA 01/21/2016, 2:26 PM

## 2016-01-21 NOTE — B[facility] Group Notes (Signed)
[facility] Group Notes:  (Nursing/MHT/Case Management/Adjunct)  Date:  01/21/2016  Time:  12:58 AM  Type of Therapy:  Group Therapy  Participation Level:  Did Not Attend    Veva Holesshley Imani Rosaland Shiffman 01/21/2016, 12:58 AM
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
## 2016-01-21 NOTE — Progress Notes (Addendum)
[facility] MD Progress Note  01/21/2016 1:05 PM Megan Edwards  MRN:  [id number]  Pt is a 40 y.o.femalepatient admitted under IVC for worsening  paranoid delusions and psychosis. Subjective:   Chart reviwed, discussed with nursing staff. Pt still psychotic paranoid as before. Rash in her thigh . Med complinat. Slept well. Met pt . Pt anxious, paranoid delusional , denies AVH. Denies SI/HI. States she had rash in her thigh, not in genital area, and was already evaluated. States toxin was coming out of her genitals because of her mom, now its better.   Principal Problem: Bipolar affective disorder [facility]) Diagnosis:   Patient Active Problem List   Diagnosis Date Noted  . Opioid use disorder, mild, in sustained remission, on maintenance therapy [F11.10] 01/21/2016  . Amphetamine use disorder, moderate ([place]) [F15.20] 01/21/2016  . Bipolar affective disorder ([place]) [F31.9] 01/20/2016  . Chronic pain syndrome [G89.4]   . Morbid obesity due to excess calories ([place]) [E66.01]   . Polysubstance (including opioids) dependence with physiol dependence ([place]) [F19.20] 08/11/2011   Total Time spent with patient: 25 min  Past Psychiatric History: no new info  Past Medical History:  Past Medical History:  Diagnosis Date  . Abnormal Pap smear   . Bipolar 1 disorder ([place])   . Depression   . Genital herpes 2000  . Hypertension   . Infection    UTI  . Ovarian cyst   . SVT (supraventricular tachycardia) (HCC)     Past Surgical History:  Procedure Laterality Date  . addenoidectomy    . BREAST ENHANCEMENT SURGERY    . BREAST SURGERY    . KNEE SURGERY    . KNEE SURGERY Left   . TONSILLECTOMY    . WISDOM TOOTH EXTRACTION     Family History:  Family History  Problem Relation Age of Onset  . Alcoholism    . Leukemia    . Cervical cancer    . Bone cancer    . Cirrhosis    . Diabetes Mellitus II    . Depression    . Hypertension    . Thyroid cancer     Family Psychiatric  History: no new  info Social History:  History  Alcohol Use No    Comment: 4 40's beer daily- not currently     History  Drug Use No    Comment: former    Social History   Social History  . Marital status: Divorced    Spouse name: N/A  . Number of children: N/A  . Years of education: N/A   Occupational History  . [employer]   Social History Main Topics  . Smoking status: Never Smoker  . Smokeless tobacco: Never Used  . Alcohol use No     Comment: 4 40's beer daily- not currently  . Drug use: No     Comment: former  . Sexual activity: Not Currently    Birth control/ protection: Abstinence   Other Topics Concern  . None   Social History Narrative  . None   Additional Social History:                          Current Medications: Current Facility-Administered Medications  Medication Dose Route Frequency Provider Last Rate Last Dose  . acetaminophen (TYLENOL) tablet 650 mg  650 mg Oral Q6H PRN Lenward Chancellor, MD      . alum & mag hydroxide-simeth (MAALOX/MYLANTA) 200-200-20 MG/5ML suspension 30 mL  30 mL Oral Q4H PRN Lenward Chancellor, MD      . buprenorphine (SUBUTEX) SL tablet 20 mg  20 mg Sublingual Daily Lenward Chancellor, MD   20 mg at 01/21/16 0805  . labetalol (NORMODYNE) tablet 100 mg  100 mg Oral BID Lenward Chancellor, MD   100 mg at 01/21/16 0805  . magnesium hydroxide (MILK OF MAGNESIA) suspension 30 mL  30 mL Oral Daily PRN Lenward Chancellor, MD      . medroxyPROGESTERone (PROVERA) tablet 10 mg  10 mg Oral Daily Lenward Chancellor, MD      . QUEtiapine (SEROQUEL) tablet 200 mg  200 mg Oral TID Lenward Chancellor, MD   200 mg at 01/21/16 1145  . traZODone (DESYREL) tablet 50 mg  50 mg Oral QHS PRN Lenward Chancellor, MD   50 mg at 01/20/16 2154  . valACYclovir (VALTREX) tablet 1,000 mg  1,000 mg Oral PRN Lenward Chancellor, MD   1,000 mg at 01/21/16 0805    Lab Results: No results found for this or any previous visit (from the past 27 hour(s)).  Blood Alcohol  level:  Lab Results  Component Value Date   ETH <5 01/18/2016   ETH <5 19/14/7829    Metabolic Disorder Labs: No results found for: HGBA1C, MPG No results found for: PROLACTIN Lab Results  Component Value Date   TRIG 603 (H) 08/12/2015    Physical Findings: AIMS:  , ,  ,  ,    CIWA:  CIWA-Ar Total: 0 COWS:    Musculoskeletal: Strength & Muscle Tone: within normal limits Gait & Station: normal Patient leans:   Psychiatric Specialty Exam: Physical Exam  ROS  Blood pressure 136/78, pulse 83, temperature 98.1 F (36.7 C), temperature source Oral, resp. rate 18, height 5' 7" (1.702 m), weight 99.8 kg (220 lb), SpO2 100 %, not currently breastfeeding.Body mass index is 34.46 kg/m.  General Appearance: obese  Eye Contact:  fair  Speech:  pressured  Volume:  normal  Mood:  good  Affect:  anxious  Thought Process:  Goal directed  Orientation:  To TPP  Thought Content:  Delusion, paraoid  Suicidal Thoughts:  denies  Homicidal Thoughts:  denies  Memory:  intact  Judgement:  limited  Insight:  poor  Psychomotor Activity:  normal  Concentration:  fair  Recall:  3/3  Fund of Knowledge:  avaerage  Language:  good  Akathisia:  none  Handed:    AIMS (if indicated):     Assets:  Cooperative with care  ADL's:  fair  Cognition:  intact  Sleep:  Number of Hours: 6.5     Treatment Plan Summary: Pt is still psychotic, paranoid delusional and has poor insight, risk to others/self, needs further stabilization as inpt. cont Seroquel, will titrate dose gradually. Cont Suboxone/subutex for opoid use d/o. Monitor rash.  Lenward Chancellor, MD 01/21/2016, 1:05 PM

## 2016-01-21 NOTE — Progress Notes (Signed)
Pt at nurse's station requesting to have her blood glucose checked. Preoccupied with her thoughts of "being diabetic." CBG 112 after dinner. Will continue to monitor.

## 2016-01-22 DIAGNOSIS — F312 Bipolar disorder, current episode manic severe with psychotic features: Principal | ICD-10-CM

## 2016-01-22 LAB — LIPID PANEL
Cholesterol: 197 mg/dL (ref 0–200)
HDL: 42 mg/dL (ref >40)
LDL CALC: 108 mg/dL — AB (ref 0–99)
Total CHOL/HDL Ratio: 4.7 RATIO
Triglycerides: 236 mg/dL — ABNORMAL HIGH (ref <150)
VLDL: 47 mg/dL — AB (ref 0–40)

## 2016-01-22 LAB — TSH: TSH: 7.186 u[IU]/mL — AB (ref 0.350–4.500)

## 2016-01-22 LAB — T4, FREE: Free T4: 0.7 ng/dL (ref 0.61–1.12)

## 2016-01-22 MED ORDER — TRAZODONE HCL 300 MG PO TABS: 300.0000 mg | ORAL_TABLET | Freq: Every evening | ORAL | 1 refills | Status: DC | PRN

## 2016-01-22 MED ORDER — LABETALOL HCL 100 MG PO TABS: 100.0000 mg | ORAL_TABLET | Freq: Two times a day (BID) | ORAL | 1 refills | Status: DC

## 2016-01-22 MED ORDER — QUETIAPINE FUMARATE 200 MG PO TABS: 200.0000 mg | ORAL_TABLET | Freq: Three times a day (TID) | ORAL | 1 refills | Status: DC

## 2016-01-22 MED ORDER — TRAZODONE HCL 100 MG PO TABS
300.0000 mg | ORAL_TABLET | Freq: Every evening | ORAL | Status: DC | PRN
  Administered 2016-01-22: 300 mg via ORAL
  Filled 2016-01-22: qty 3

## 2016-01-22 NOTE — [facility] Group Notes (Signed)
[facility] Group Notes:  (Nursing/MHT/Case Management/Adjunct)  Date:  01/22/2016  Time:  4:48 AM  Type of Therapy:  Psychoeducational Skills  Participation Level:  Active  Participation Quality:  Appropriate and Sharing  Affect:  Appropriate  Cognitive:  Appropriate  Insight:  Appropriate and Good  Engagement in Group:  Engaged  Modes of Intervention:  Discussion, Socialization and Support  Summary of Progress/Problems:  Chancy MilroyLaquanda Y Neddie Steedman 01/22/2016, 4:48 AM

## 2016-01-22 NOTE — Progress Notes (Signed)
Recreation Therapy Notes  Date: 10.30.17 Time: 1:00 pm Location: Craft Room  Group Topic: Wellness  Goal Area(s) Addresses:  Patient will identify at least one item per dimension of health. Patient will examine areas they are deficient in.  Behavioral Response: Did not attend  Intervention: 6 Dimensions of Health  Activity: Patients were given a definition sheet of the 6 Dimensions of Health. Patients were also given a worksheet with each dimension of health listed on it. Patients were instructed to write at least one item they were currently doing in each dimension. LRT encouraged patients to write 2-3 items.  Education: LRT educated patients on ways to increase each dimension.  Education Outcome: Patient did not attend group.  Clinical Observations/Feedback: Patient did not attend group.  Jacquelynn CreeGreene,Megan Edwards, LRT/CTRS 01/22/2016 1:49 PM

## 2016-01-22 NOTE — Progress Notes (Signed)
Patient has be pleasant today. Slept good per patient. Appetite was good and anxiety was a 2/10. Still has area between thighs that is healing. Patient states her goals for the day was a transition plan and to write small goals so she become a better [employer]Kelly. No SI/HI or AVH. Hyperverbal and very optimistic about discharge tomorrow. Attended groups today and interacted with peers. Preoccupied with her suboxone medication regimen. Support offered. Safety maintained with q15 minute checks. Will continue to monitor.

## 2016-01-22 NOTE — Plan of Care (Signed)
Problem: Coping: Goal: Ability to cope will improve Outcome: Progressing Patient states she is very optomisitic about discharging.

## 2016-01-22 NOTE — Progress Notes (Signed)
Pt was observed in the dayroom watching television and appropriately interacting with peers. Alert and oriented x 4, respirations even and unlabored, gait steady and unassisted, no acute distress noted. Denies depression and anxiety. Also denies SI/HI/AVH. Presents with a delusional thought pattern stating "the toxins were coming out of my sore yesterday but today they're being released from my feet." Pt did report that she was "feeling optimistic and I've been writing down my goals. I feel great. I'm looking forward to being transferred." Is group and medication compliant. Remains on q 15 minute observation rounds for safety. Will continue to monitor.

## 2016-01-22 NOTE — Progress Notes (Addendum)
[facility] MD Progress Note  01/22/2016 11:00 AM Megan Edwards  MRN:  [id number]   Megan Edwards is a 40 y.o.femalepatient admitted under IVC for worsening  paranoid delusions and psychosis.  Subjective:   Today the patient reports feeling much better mostly physically. Her delusion is that while poisoned with bed at home she became less able to withstand social stressors. She has been in child support battle with her ex-husband. She still believes that there is something wrong with her apartment she lives with her two children but started negotiating with the landlord to switch apartments. She still is slightly paranoid but has much more insight into her problems. She tolerates medications well. She complains of poor sleep but is very careful not to be prescribed any controlled substances. She has been going to Suboxone clinic for the past 6 years and has been compliant with treatment. When asked about stimulants on her tox screen, she reports that they came from and dieting pill.   Principal Problem: Bipolar affective disorder [facility]) Diagnosis:   Patient Active Problem List   Diagnosis Date Noted  . Opioid use disorder, mild, in sustained remission, on maintenance therapy [F11.10] 01/21/2016  . Amphetamine use disorder, moderate (HCC) [F15.20] 01/21/2016  . Bipolar affective disorder (HCC) [F31.9] 01/20/2016  . Chronic pain syndrome [G89.4]   . Morbid obesity due to excess calories (HCC) [E66.01]   . Polysubstance (including opioids) dependence with physiol dependence (HCC) [F19.20] 08/11/2011   Total Time spent with patient: 25 min  Past Psychiatric History: no new info  Past Medical History:  Past Medical History:  Diagnosis Date  . Abnormal Pap smear   . Bipolar 1 disorder (HCC)   . Depression   . Genital herpes 2000  . Hypertension   . Infection    UTI  . Ovarian cyst   . SVT (supraventricular tachycardia) (HCC)     Past Surgical History:  Procedure Laterality Date  .  addenoidectomy    . BREAST ENHANCEMENT SURGERY    . BREAST SURGERY    . KNEE SURGERY    . KNEE SURGERY Left   . TONSILLECTOMY    . WISDOM TOOTH EXTRACTION     Family History:  Family History  Problem Relation Age of Onset  . Alcoholism    . Leukemia    . Cervical cancer    . Bone cancer    . Cirrhosis    . Diabetes Mellitus II    . Depression    . Hypertension    . Thyroid cancer     Family Psychiatric  History: no new info Social History:  History  Alcohol Use No    Comment: 4 40's beer daily- not currently     History  Drug Use No    Comment: former    Social History   Social History  . Marital status: Divorced    Spouse name: N/A  . Number of children: N/A  . Years of education: N/A   Occupational History  . Server Megan Edwards   Social History Main Topics  . Smoking status: Never Smoker  . Smokeless tobacco: Never Used  . Alcohol use No     Comment: 4 40's beer daily- not currently  . Drug use: No     Comment: former  . Sexual activity: Not Currently    Birth control/ protection: Abstinence   Other Topics Concern  . None   Social History Narrative  . None   Additional Social History:  Current Medications: Current Facility-Administered Medications  Medication Dose Route Frequency Provider Last Rate Last Dose  . acetaminophen (TYLENOL) tablet 650 mg  650 mg Oral Q6H PRN Beverly SessionsJagannath Subedi, MD      . alum & mag hydroxide-simeth (MAALOX/MYLANTA) 200-200-20 MG/5ML suspension 30 mL  30 mL Oral Q4H PRN Beverly SessionsJagannath Subedi, MD      . buprenorphine (SUBUTEX) SL tablet 20 mg  20 mg Sublingual Daily Beverly SessionsJagannath Subedi, MD   20 mg at 01/22/16 0829  . labetalol (NORMODYNE) tablet 100 mg  100 mg Oral BID Beverly SessionsJagannath Subedi, MD   100 mg at 01/22/16 0830  . magnesium hydroxide (MILK OF MAGNESIA) suspension 30 mL  30 mL Oral Daily PRN Beverly SessionsJagannath Subedi, MD      . medroxyPROGESTERone (PROVERA) tablet 10 mg  10 mg Oral Daily Beverly SessionsJagannath  Subedi, MD   10 mg at 01/22/16 0830  . QUEtiapine (SEROQUEL) tablet 200 mg  200 mg Oral TID Beverly SessionsJagannath Subedi, MD   200 mg at 01/22/16 0830  . traZODone (DESYREL) tablet 50 mg  50 mg Oral QHS PRN Beverly SessionsJagannath Subedi, MD   50 mg at 01/21/16 2125  . valACYclovir (VALTREX) tablet 1,000 mg  1,000 mg Oral PRN Beverly SessionsJagannath Subedi, MD   1,000 mg at 01/21/16 0805    Lab Results:  Results for orders placed or performed during the hospital encounter of 01/20/16 (from the past 48 hour(s))  Glucose, capillary     Status: Abnormal   Collection Time: 01/21/16  5:11 PM  Result Value Ref Range   Glucose-Capillary 112 (H) 65 - 99 mg/dL  Lipid panel     Status: Abnormal   Collection Time: 01/22/16  7:14 AM  Result Value Ref Range   Cholesterol 197 0 - 200 mg/dL   Triglycerides 161236 (H) <150 mg/dL   HDL 42 >09>40 mg/dL   Total CHOL/HDL Ratio 4.7 RATIO   VLDL 47 (H) 0 - 40 mg/dL   LDL Cholesterol 604108 (H) 0 - 99 mg/dL    Comment:        Total Cholesterol/HDL:CHD Risk Coronary Heart Disease Risk Table                     Men   Women  1/2 Average Risk   3.4   3.3  Average Risk       5.0   4.4  2 X Average Risk   9.6   7.1  3 X Average Risk  23.4   11.0        Use the calculated Patient Ratio above and the CHD Risk Table to determine the patient's CHD Risk.        ATP III CLASSIFICATION (LDL):  <100     mg/dL   Optimal  540-981100-129  mg/dL   Near or Above                    Optimal  130-159  mg/dL   Borderline  191-478160-189  mg/dL   High  >295>190     mg/dL   Very High   TSH     Status: Abnormal   Collection Time: 01/22/16  7:14 AM  Result Value Ref Range   TSH 7.186 (H) 0.350 - 4.500 uIU/mL    Comment: Performed by a 3rd Generation assay with a functional sensitivity of <=0.01 uIU/mL.    Blood Alcohol level:  Lab Results  Component Value Date   ETH <5 01/18/2016   ETH <5 10/21/2015  Metabolic Disorder Labs: No results found for: HGBA1C, MPG No results found for: PROLACTIN Lab Results  Component Value  Date   CHOL 197 01/22/2016   TRIG 236 (H) 01/22/2016   HDL 42 01/22/2016   CHOLHDL 4.7 01/22/2016   VLDL 47 (H) 01/22/2016   LDLCALC 108 (H) 01/22/2016    Physical Findings: AIMS:  , ,  ,  ,    CIWA:  CIWA-Ar Total: 0 COWS:    Musculoskeletal: Strength & Muscle Tone: within normal limits Gait & Station: normal Patient leans:   Psychiatric Specialty Exam: Physical Exam  Nursing note and vitals reviewed.   Review of Systems  Psychiatric/Behavioral: Positive for hallucinations and suicidal ideas. The patient has insomnia.   All other systems reviewed and are negative.   Blood pressure 132/84, pulse 79, temperature 98.4 F (36.9 C), temperature source Oral, resp. rate 18, height 5\' 7"  (1.702 m), weight 99.8 kg (220 lb), SpO2 100 %, not currently breastfeeding.Body mass index is 34.46 kg/m.  General Appearance: obese  Eye Contact:  fair  Speech:  pressured  Volume:  normal  Mood:  good  Affect:  anxious  Thought Process:  Goal directed  Orientation:  To TPP  Thought Content:  Delusion, paraoid  Suicidal Thoughts:  denies  Homicidal Thoughts:  denies  Memory:  intact  Judgement:  limited  Insight:  poor  Psychomotor Activity:  normal  Concentration:  fair  Recall:  3/3  Fund of Knowledge:  avaerage  Language:  good  Akathisia:  none  Handed:    AIMS (if indicated):     Assets:  Cooperative with care  ADL's:  fair  Cognition:  intact  Sleep:  Number of Hours: 6.5     Treatment Plan Summary:  Ms. Lelon HuhDellinger is a 40 year old female with a history of bipolar disorder and substance use admitted in a psychotic episode.  1. Psychosis. She has been continued on Seroquel 200 mg 3 times daily for psychosis and mood stabilization.  2. Opiate dependence. We'll continue Subutex 20 mg daily.  3. Insomnia. We'll increase trazodone to 300 mg for sleep.  4. Metabolic syndrome monitoring. Her triglycerides are slightly elevated. TSH is 7. We will check free T4. Hemoglobin  A1c is pending.  5. EKG. Sinus tachycardia. QT 329. Her heart rate is no longer elevated.  6.  Hypertension. She is on Lamictal.   7. Disposition. The patient will be discharged to home. She will follow up with [facility] for medication management, Suboxone treatment, and therapy.   Kristine LineaJolanta Ewart Carrera, MD 01/22/2016, 11:00 AM

## 2016-01-22 NOTE — Plan of Care (Signed)
Problem: Safety: Goal: Ability to remain free from injury will improve Outcome: Progressing Patient has been free from any injury while on the unit

## 2016-01-22 NOTE — [facility] Group Notes (Signed)
[facility] Group Notes:  (Nursing/MHT/Case Management/Adjunct)  Date:  01/22/2016  Time:  5:19 PM  Type of Therapy:  Psychoeducational Skills  Participation Level:  Active  Participation Quality:  Appropriate, Attentive and Supportive  Affect:  Appropriate  Cognitive:  Appropriate  Insight:  Appropriate  Engagement in Group:  Engaged and Supportive  Modes of Intervention:  Discussion and Education  Summary of Progress/Problems:  Megan Edwards 01/22/2016, 5:19 PM

## 2016-01-22 NOTE — [facility] Group Notes (Signed)
[facility] Group Notes:  (Nursing/MHT/Case Management/Adjunct)  Date:  01/22/2016  Time:  9:52 PM  Type of Therapy:  Evening Wrap-up Group  Participation Level:  Active  Participation Quality:  Appropriate, Sharing and Supportive  Affect:  Appropriate  Cognitive:  Alert and Appropriate  Insight:  Appropriate and Good  Engagement in Group:  Engaged  Modes of Intervention:  Discussion  Summary of Progress/Problems:  Tomasita MorrowChelsea Nanta Keahi Mccarney 01/22/2016, 9:52 PM

## 2016-01-22 NOTE — [facility] Suicide Risk Assessment (Signed)
[facility]   Principal Problem: Bipolar I disorder, most recent episode manic, severe with psychotic features [facility](HCC) Discharge Diagnoses:  Patient Active Problem List   Diagnosis Date Noted  . Opioid use disorder, mild, in sustained remission, on maintenance therapy [F11.10] 01/21/2016  . Amphetamine use disorder, moderate (HCC) [F15.20] 01/21/2016  . Bipolar I disorder, most recent episode manic, severe with psychotic features (HCC) [F31.2] 01/20/2016  . Chronic pain syndrome [G89.4]   . Morbid obesity due to excess calories (HCC) [E66.01]   . Polysubstance (including opioids) dependence with physiol dependence (HCC) [F19.20] 08/11/2011    Total Time spent with patient: 30 minutes  Musculoskeletal: Strength & Muscle Tone: within normal limits Gait & Station: normal Patient leans: N/A  Psychiatric Specialty Exam: Review of Systems  All other systems reviewed and are negative.   Blood pressure 132/84, pulse 79, temperature 98.4 F (36.9 C), temperature source Oral, resp. rate 18, height 5\' 7"  (1.702 m), weight 99.8 kg (220 lb), SpO2 100 %, not currently breastfeeding.Body mass index is 34.46 kg/m.  General Appearance: Casual  Eye Contact::  Good  Speech:  Clear and Coherent409  Volume:  Normal  Mood:  Euthymic  Affect:  Appropriate  Thought Process:  Goal Directed and Descriptions of Associations: Intact  Orientation:  Full (Time, Place, and Person)  Thought Content:  Delusions and Paranoid Ideation  Suicidal Thoughts:  No  Homicidal Thoughts:  No  Memory:  Immediate;   Fair Recent;   Fair Remote;   Fair  Judgement:  Impaired  Insight:  Shallow  Psychomotor Activity:  Normal  Concentration:  Fair  Recall:  Fis[employer]Fair  Fund of Knowledge:Fair  Language: Fair  Akathisia:  No  Handed:  Right  AIMS (if indicated):     Assets:  Communication Skills Desire for Improvement Financial Resources/Insurance Housing Physical Health Resilience Social  Support Transportation  Sleep:  Number of Hours: 6.5  Cognition: WNL  ADL's:  Intact   Mental Status Per Nursing Assessment::   On Admission:     Demographic Factors:  Divorced or widowed, Caucasian and Unemployed  Loss Factors: Legal issues and Financial problems/change in socioeconomic status  Historical Factors: Prior suicide attempts and Impulsivity  Risk Reduction Factors:   Responsible for children under 40 years of age, Sense of responsibility to family, Living with another person, especially a relative, Positive social support and Positive therapeutic relationship  Continued Clinical Symptoms:  Bipolar Disorder:   Mixed State  Cognitive Features That Contribute To Risk:  None    Suicide Risk:  Minimal: No identifiable suicidal ideation.  Patients presenting with no risk factors but with morbid ruminations; may be classified as minimal risk based on the severity of the depressive symptoms  Follow-up Information    [facility]   Why:  Please arrive on Wednesday October 1st, 2017 to see [name] 5:30pm for your hospital follow up for medication management, substance abuse treatment and therapy Contact information: [address] [place] [id number] Ph: [phone] Fax: [phone]          Plan Of Care/Follow-up recommendations:  Activity:  as tolerated. Diet:  low sodium heart healthy. Other:  keep follow up appointments.  Kristine LineaJolanta Pucilowska, MD 01/22/2016, 1:58 PM

## 2016-01-22 NOTE — [facility] Group Notes (Signed)
[facility] LCSW Group Therapy Note  Date/Time: 01/22/2016 9:30am  Type of Therapy and Topic:  Group Therapy:  Overcoming Obstacles  Participation Level:  Good participation   Description of Group:    In this group patients will be encouraged to explore what they see as obstacles to their own wellness and recovery. They will be guided to discuss their thoughts, feelings, and behaviors related to these obstacles. The group will process together ways to cope with barriers, with attention given to specific choices patients can make. Each patient will be challenged to identify changes they are motivated to make in order to overcome their obstacles. This group will be process-oriented, with patients participating in exploration of their own experiences as well as giving and receiving support and challenge from other group members.  Therapeutic Goals: 1. Patient will identify personal and current obstacles as they relate to admission. 2. Patient will identify barriers that currently interfere with their wellness or overcoming obstacles.  3. Patient will identify feelings, thought process and behaviors related to these barriers. 4. Patient will identify two changes they are willing to make to overcome these obstacles:    Summary of Patient Progress Pt shares her difficulty with current custody battle, family conflict and distrust that she feels. She shares that she feels others may be trying to hurt her but that she hopes this is not true and can be treated for this paranoia. She shares challenges around living in a chaotic family and some of her plans like giving others boundaries and making her counseling a priority as ways to address these obstacles.   Able to achieve therapeutic goals.   Jake SharkSara Satchel Heidinger, MSW, LCSW 01/22/2016

## 2016-01-23 LAB — HEMOGLOBIN A1C
Hgb A1c MFr Bld: 5.7 % — ABNORMAL HIGH (ref 4.8–5.6)
MEAN PLASMA GLUCOSE: 117 mg/dL

## 2016-01-23 NOTE — Progress Notes (Signed)
Interacting well with peers, observed in the day room playing cards, pleasant on approach, euphoric, "I am going through a crazy custody and child support right now, I would not kill myself, I have 3 years and a 476 months old children. My mother is taking care of the children at this time until I am discharged tomorrow." Trazodone 300 mg PO, PRN for sleep at bedtime.

## 2016-01-23 NOTE — Tx Team (Signed)
Interdisciplinary Treatment and Diagnostic Plan Update  01/23/2016 Time of Session: 10:30am Megan Edwards MRN: [id number]  Principal Diagnosis: Bipolar I disorder, most recent episode manic, severe with psychotic features (HCC)  Secondary Diagnoses: Principal Problem:   Bipolar I disorder, most recent episode manic, severe with psychotic features (HCC) Active Problems:   Opioid use disorder, mild, in sustained remission, on maintenance therapy   Amphetamine use disorder, moderate (HCC)   Current Medications:  Current Facility-Administered Medications  Medication Dose Route Frequency Provider Last Rate Last Dose  . acetaminophen (TYLENOL) tablet 650 mg  650 mg Oral Q6H PRN Beverly Sessions, MD      . alum & mag hydroxide-simeth (MAALOX/MYLANTA) 200-200-20 MG/5ML suspension 30 mL  30 mL Oral Q4H PRN Beverly Sessions, MD      . buprenorphine (SUBUTEX) SL tablet 20 mg  20 mg Sublingual Daily Beverly Sessions, MD   20 mg at 01/23/16 0827  . labetalol (NORMODYNE) tablet 100 mg  100 mg Oral BID Beverly Sessions, MD   100 mg at 01/23/16 0828  . magnesium hydroxide (MILK OF MAGNESIA) suspension 30 mL  30 mL Oral Daily PRN Beverly Sessions, MD      . medroxyPROGESTERone (PROVERA) tablet 10 mg  10 mg Oral Daily Beverly Sessions, MD   10 mg at 01/23/16 0828  . QUEtiapine (SEROQUEL) tablet 200 mg  200 mg Oral TID Beverly Sessions, MD   200 mg at 01/23/16 0828  . traZODone (DESYREL) tablet 300 mg  300 mg Oral QHS PRN Shari Prows, MD   300 mg at 01/22/16 2147  . valACYclovir (VALTREX) tablet 1,000 mg  1,000 mg Oral PRN Beverly Sessions, MD   1,000 mg at 01/21/16 0805   PTA Medications: Prescriptions Prior to Admission  Medication Sig Dispense Refill Last Dose  . BIOTIN PO Take 1 tablet by mouth 2 (two) times daily.    UNK at [facility]  . buprenorphine-naloxone (SUBOXONE) 2-0.5 MG SUBL SL tablet Place 1 tablet under the tongue every evening.    UNK at [facility]  . clindamycin (CLEOCIN) 300 MG  capsule Take 1 capsule (300 mg total) by mouth every 6 (six) hours. (Patient not taking: Reported on 01/18/2016) 40 capsule 0 Not Taking at Unknown time  . labetalol (NORMODYNE) 200 MG tablet Take 200 mg by mouth 3 (three) times daily.   UNK at [facility]  . medroxyPROGESTERone (PROVERA) 10 MG tablet Take 1 tablet (10 mg total) by mouth daily. 10 tablet 2 UNK at [facility]  . QUEtiapine (SEROQUEL) 100 MG tablet Take 1 tablet (100 mg total) by mouth 3 (three) times daily. (Patient taking differently: Take 100 mg by mouth daily. )   UNK at [facility]  . SUBOXONE 8-2 MG FILM Place 2.5 Film under the tongue daily.   0 UNK at [facility]  . valACYclovir (VALTREX) 1000 MG tablet Take 1 tablet (1,000 mg total) by mouth as needed (with flare ups). 30 tablet 3 UNK at [facility]  . [DISCONTINUED] labetalol (NORMODYNE) 100 MG tablet Take 1 tablet (100 mg total) by mouth 2 (two) times daily. (Patient not taking: Reported on 01/18/2016) 60 tablet 1 Not Taking at Unknown time    Patient Stressors: Legal issue Substance abuse  Patient Strengths: Licensed conveyancer Supportive family/friends  Treatment Modalities: Medication Management, Group therapy, Case management,  1 to 1 session with clinician, Psychoeducation, Recreational therapy.   Physician Treatment Plan for Primary Diagnosis: Bipolar I disorder, most recent episode manic, severe with psychotic features (HCC) Long Term Goal(s):  Short Term Goals:    Medication Management: Evaluate patient's response, side effects, and tolerance of medication regimen.  Therapeutic Interventions: 1 to 1 sessions, Unit Group sessions and Medication administration.  Evaluation of Outcomes: Adequate for discharge  Physician Treatment Plan for Secondary Diagnosis: Principal Problem:   Bipolar I disorder, most recent episode manic, severe with psychotic features (HCC) Active Problems:   Opioid use disorder, mild, in sustained remission, on maintenance therapy   Amphetamine use  disorder, moderate (HCC)  Long Term Goal(s):     Short Term Goals:       Medication Management: Evaluate patient's response, side effects, and tolerance of medication regimen.  Therapeutic Interventions: 1 to 1 sessions, Unit Group sessions and Medication administration.  Evaluation of Outcomes: Adequate for discharge   RN Treatment Plan for Primary Diagnosis: Bipolar I disorder, most recent episode manic, severe with psychotic features (HCC) Long Term Goal(s): Knowledge of disease and therapeutic regimen to maintain health will improve  Short Term Goals: Ability to identify and develop effective coping behaviors will improve and Compliance with prescribed medications will improve  Medication Management: RN will administer medications as ordered by provider, will assess and evaluate patient's response and provide education to patient for prescribed medication. RN will report any adverse and/or side effects to prescribing provider.  Therapeutic Interventions: 1 on 1 counseling sessions, Psychoeducation, Medication administration, Evaluate responses to treatment, Monitor vital signs and CBGs as ordered, Perform/monitor CIWA, COWS, AIMS and Fall Risk screenings as ordered, Perform wound care treatments as ordered.  Evaluation of Outcomes: Adequate for discharge   LCSW Treatment Plan for Primary Diagnosis: Bipolar I disorder, most recent episode manic, severe with psychotic features (HCC) Long Term Goal(s): Safe transition to appropriate next level of care at discharge, Engage patient in therapeutic group addressing interpersonal concerns.  Short Term Goals: Engage patient in aftercare planning with referrals and resources, Increase social support, Increase ability to appropriately verbalize feelings, Increase emotional regulation, Facilitate acceptance of mental health diagnosis and concerns, Facilitate patient progression through stages of change regarding substance use diagnoses and  concerns, Identify triggers associated with mental health/substance abuse issues and Increase skills for wellness and recovery  Therapeutic Interventions: Assess for all discharge needs, 1 to 1 time with Social worker, Explore available resources and support systems, Assess for adequacy in community support network, Educate family and significant other(s) on suicide prevention, Complete Psychosocial Assessment, Interpersonal group therapy.  Evaluation of Outcomes: Adequate for discharge   Progress in Treatment: Attending groups: Yes. Participating in groups: Yes. Taking medication as prescribed: Yes. Toleration medication: Yes. Family/Significant other contact made: No, will contact:  pt refused Patient understands diagnosis: Yes. Discussing patient identified problems/goals with staff: Yes. Medical problems stabilized or resolved: Yes. Denies suicidal/homicidal ideation: Yes. Issues/concerns per patient self-inventory: Yes. Other: none listed  New problem(s) identified: No, Describe:  none reported  New Short Term/Long Term Goal(s):  Discharge Plan or Barriers: [place] and [facility] for medication management, substance abuse treatment and therapy  Reason for Continuation of Hospitalization: Now: Adequate for discharge Anxiety Delusions  Depression Hallucinations Aggression  Estimated Date of discharge 01/23/16  Attendees: Patient: Megan Edwards 01/23/2016 11:00 AM  Physician: Dr. Jennet MaduroPucilowska, MD 01/23/2016 11:00 AM  Nursing: Cheri GuppyGigi Maaniatu 01/23/2016 11:00 AM  RN Care Manager: 01/23/2016 11:00 AM  Social Worker: Dorothe PeaJonathan F. Pennie Vanblarcom, LCSWA, LCAS 01/23/2016 11:00 AM  Recreational Therapist:  Hershal CoriaBeth Greene, LRT 01/23/2016 11:00 AM  Other:  01/23/2016 11:00 AM  Other:  01/23/2016 11:00 AM  Other: 01/23/2016 11:00 AM    Scribe for Treatment Team: Dorothe PeaJonathan F Finnbar Cedillos, LCSWA 01/23/2016

## 2016-01-23 NOTE — Progress Notes (Signed)
  [facility] :  Will you be returning to the same living situation after discharge:  Yes,  [place] At discharge, do you have transportation home?: Yes,  pt will be picked up by her mother Do you have the ability to pay for your medications: Yes,  pt will be provided with prescriptions at discharge  Release of information consent forms completed and in the chart;  Patient's signature needed at discharge.  Patient to Follow up at: Follow-up Information    [facility]   Why:  Please arrive on Wednesday November 1st, 2017 to see Megan MoulderSharon Edwards 5:30pm for your hospital follow up for medication management, substance abuse treatment and therapy Contact information: [address] [place] [address] Ph: [phone] Fax: [phone]          Next level of care provider has access to [facility] Link:no  Safety Planning and Suicide Prevention discussed: Yes,  completed with pt  Have you used any form of tobacco in the last 30 days? (Cigarettes, Smokeless Tobacco, Cigars, and/or Pipes): No  Has patient been referred to the Quitline?: N/A patient is not a smoker  Patient has been referred for addiction treatment: Yes  Megan Edwards 01/23/2016, 12:49 PM

## 2016-01-23 NOTE — [facility] Suicide Risk Assessment (Signed)
[facility]   Principal Problem: Bipolar I disorder, most recent episode manic, severe with psychotic features [facility](HCC) Discharge Diagnoses:  Patient Active Problem List   Diagnosis Date Noted  . Opioid use disorder, mild, in sustained remission, on maintenance therapy [F11.10] 01/21/2016  . Amphetamine use disorder, moderate (HCC) [F15.20] 01/21/2016  . Bipolar I disorder, most recent episode manic, severe with psychotic features (HCC) [F31.2] 01/20/2016  . Chronic pain syndrome [G89.4]   . Morbid obesity due to excess calories (HCC) [E66.01]   . Polysubstance (including opioids) dependence with physiol dependence (HCC) [F19.20] 08/11/2011    Total Time spent with patient: 30 minutes  Musculoskeletal: Strength & Muscle Tone: within normal limits Gait & Station: normal Patient leans: N/A  Psychiatric Specialty Exam: Review of Systems  All other systems reviewed and are negative.   Blood pressure 120/78, pulse 80, temperature 98 F (36.7 C), temperature source Oral, resp. rate 18, height 5\' 7"  (1.702 m), weight 99.8 kg (220 lb), SpO2 100 %, not currently breastfeeding.Body mass index is 34.46 kg/m.  General Appearance: Casual  Eye Contact::  Good  Speech:  Clear and Coherent409  Volume:  Normal  Mood:  Euthymic  Affect:  Appropriate  Thought Process:  Goal Directed and Descriptions of Associations: Intact  Orientation:  Full (Time, Place, and Person)  Thought Content:  Delusions and Paranoid Ideation  Suicidal Thoughts:  No  Homicidal Thoughts:  No  Memory:  Immediate;   Fair Recent;   Fair Remote;   Fair  Judgement:  Impaired  Insight:  Shallow  Psychomotor Activity:  Normal  Concentration:  Fair  Recall:  [employer]Fair  Fund of Knowledge:Fair  Language: Fair  Akathisia:  No  Handed:  Right  AIMS (if indicated):     Assets:  Communication Skills Desire for Improvement Financial Resources/Insurance Housing Physical Health Resilience Social  Support Transportation  Sleep:  Number of Hours: 7.45  Cognition: WNL  ADL's:  Intact   Mental Status Per Nursing Assessment::   On Admission:     Demographic Factors:  Divorced or widowed, Caucasian and Unemployed  Loss Factors: Legal issues and Financial problems/change in socioeconomic status  Historical Factors: Prior suicide attempts and Impulsivity  Risk Reduction Factors:   Responsible for children under 40 years of age, Sense of responsibility to family, Living with another person, especially a relative, Positive social support and Positive therapeutic relationship  Continued Clinical Symptoms:  Bipolar Disorder:   Mixed State  Cognitive Features That Contribute To Risk:  None    Suicide Risk:  Minimal: No identifiable suicidal ideation.  Patients presenting with no risk factors but with morbid ruminations; may be classified as minimal risk based on the severity of the depressive symptoms  Follow-up Information    [facility]   Why:  Please arrive on Wednesday October 1st, 2017 to see [name] 5:30pm for your hospital follow up for medication management, substance abuse treatment and therapy Contact information: [address] [place] [id number] Ph: [phone] Fax: [phone]          Plan Of Care/Follow-up recommendations:  Activity:  as tolerated. Diet:  low sodium heart healthy. Other:  keep follow up appointments.  Kristine LineaJolanta Chaddrick Brue, MD 01/23/2016, 9:25 AM

## 2016-01-23 NOTE — Progress Notes (Signed)
Awake, alert, oriented and up on unit this morning appropriately interacting with staff/peers. Smiling, bright. Reports good sleep last night with the help of medications. Reports good appetite, normal energy, good concentration. Rates depression, anxiety, and hopelessness 0/10 (low 0-10 high). Denies SI/HI/AVH. Does not speak of toxins leaving her body or being poisoned. Medication compliant.   Support and encouragement provided with use of therapeutic communication. Medications administered as ordered with education. Safety maintained with every 15 minute checks. Will continue to monitor.

## 2016-01-23 NOTE — Progress Notes (Signed)
Provided and reviewed discharge paperwork and prescriptions. Verified understanding by use of teach back method. Verbalized understanding. Denies SI/HI/AVH. Pt's mother to pick up around 1300 to transport pt home. Pt belongings returned to include items pt kept in her room, items stored in pt specific locker and medication kept in pharmacy. Safety maintained with every 15 minute checks. Will continue to monitor.
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
## 2016-01-25 ENCOUNTER — Ambulatory Visit: Payer: Self-pay | Admitting: Obstetrics

## 2016-02-13 ENCOUNTER — Telehealth: Payer: Self-pay | Admitting: *Deleted

## 2016-02-13 NOTE — Telephone Encounter (Signed)
Patient's brother and sister called concerned about patient- she has been extremely sick lately. They are afraid that she may have kidney failure and she has not been seen for that. She has been in and out of the hospital for mental issues and even has served some short jail time. They want to know if there is any way that we can reach out to her. Information taken and patient neither confirmed or denied to be ours. Flo Shanksold Paula I would check the records and see even if she was our patient. She is due a pap/annual exam. Contact # [phone]

## 2016-02-17 ENCOUNTER — Emergency Department (HOSPITAL_COMMUNITY): Payer: Medicaid Other

## 2016-02-17 ENCOUNTER — Emergency Department (HOSPITAL_COMMUNITY)
Admission: EM | Admit: 2016-02-17 | Discharge: 2016-02-17 | Disposition: A | Discharge status: Home / Self Care | Payer: Medicaid Other | Attending: Emergency Medicine | Admitting: Emergency Medicine

## 2016-02-17 ENCOUNTER — Encounter (HOSPITAL_COMMUNITY): Payer: Self-pay | Admitting: Emergency Medicine

## 2016-02-17 DIAGNOSIS — W010XXA Fall on same level from slipping, tripping and stumbling without subsequent striking against object, initial encounter: Secondary | ICD-10-CM | POA: Insufficient documentation

## 2016-02-17 DIAGNOSIS — Y999 Unspecified external cause status: Secondary | ICD-10-CM | POA: Insufficient documentation

## 2016-02-17 DIAGNOSIS — I10 Essential (primary) hypertension: Secondary | ICD-10-CM | POA: Insufficient documentation

## 2016-02-17 DIAGNOSIS — Y929 Unspecified place or not applicable: Secondary | ICD-10-CM | POA: Diagnosis not present

## 2016-02-17 DIAGNOSIS — Z79899 Other long term (current) drug therapy: Secondary | ICD-10-CM | POA: Insufficient documentation

## 2016-02-17 DIAGNOSIS — Y939 Activity, unspecified: Secondary | ICD-10-CM | POA: Insufficient documentation

## 2016-02-17 DIAGNOSIS — M25531 Pain in right wrist: Secondary | ICD-10-CM | POA: Diagnosis present

## 2016-02-17 MED ORDER — MELOXICAM 15 MG PO TABS: 15.0000 mg | ORAL_TABLET | Freq: Every day | ORAL | 0 refills | Status: DC

## 2016-02-17 MED ORDER — IBUPROFEN 200 MG PO TABS
600.0000 mg | ORAL_TABLET | Freq: Once | ORAL | Status: AC
  Administered 2016-02-17: 600 mg via ORAL
  Filled 2016-02-17: qty 3

## 2016-02-17 NOTE — ED Triage Notes (Signed)
Patient here with complaints of fall yesterday and right wrist pain. Reports that she has had multiple surgeries on the wrist before. Pain 8/10.

## 2016-02-17 NOTE — ED Provider Notes (Signed)
[facility]-EMERGENCY DEPT Provider Note   CSN: [id number] Arrival date & time: 02/17/16  1252 By signing my name below, I, Megan Edwards, attest that this documentation has been prepared under the direction and in the presence of Megan Hart, PA-C. Electronically Signed: Bridgette Edwards, ED Scribe. 02/17/16. 2:01 PM.  History   Chief Complaint Chief Complaint  Patient presents with  . Fall  . Wrist Pain   HPI Comments: Megan Edwards is a 40 y.o. female with h/o HTN, SVT, and polysubstance dependence who presents to the Emergency Department complaining of 8/10 right wrist pain s/p mechanical fall one day ago. Pt states she tripped and tried to catch herself with her right hand and wrist. No LOC. Pt reports she's injured her wrist in the past as a teenager and was supposed to have surgery on it but never did. The pain is on the medial aspect of wrist and distal forearm. Pain is exacerbated with movement. She has taken Ibuprofen with no relief. She is able to move her fingers. Denies numbness or tingling. Denies any additional injuries.   The history is provided by the patient. No language interpreter was used.    Past Medical History:  Diagnosis Date  . Abnormal Pap smear   . Bipolar 1 disorder (HCC)   . Depression   . Genital herpes 2000  . Hypertension   . Infection    UTI  . Ovarian cyst   . SVT (supraventricular tachycardia) [facility])     Patient Active Problem List   Diagnosis Date Noted  . Opioid use disorder, mild, in sustained remission, on maintenance therapy 01/21/2016  . Amphetamine use disorder, moderate (HCC) 01/21/2016  . Bipolar I disorder, most recent episode manic, severe with psychotic features (HCC) 01/20/2016  . Chronic pain syndrome   . Morbid obesity due to excess calories (HCC)   . Polysubstance (including opioids) dependence with physiol dependence (HCC) 08/11/2011    Past Surgical History:  Procedure Laterality Date  . addenoidectomy    . BREAST ENHANCEMENT  SURGERY    . BREAST SURGERY    . KNEE SURGERY    . KNEE SURGERY Left   . TONSILLECTOMY    . WISDOM TOOTH EXTRACTION      OB History    Gravida Para Term Preterm AB Living   5 2 2  0 3 2   SAB TAB Ectopic Multiple Live Births   1 2 0 0 2       Home Medications    Prior to Admission medications   Medication Sig Start Date End Date Taking? Authorizing Provider  BIOTIN PO Take 1 tablet by mouth 2 (two) times daily.     Historical Provider, MD  labetalol (NORMODYNE) 100 MG tablet Take 1 tablet (100 mg total) by mouth 2 (two) times daily. 01/22/16   Shari Prows, MD  medroxyPROGESTERone (PROVERA) 10 MG tablet Take 1 tablet (10 mg total) by mouth daily. 10/10/15   Brock Bad, MD  QUEtiapine (SEROQUEL) 200 MG tablet Take 1 tablet (200 mg total) by mouth 3 (three) times daily. 01/22/16   Jolanta B Pucilowska, MD  SUBOXONE 8-2 MG FILM Place 2.5 Film under the tongue daily.  12/08/15   Historical Provider, MD  traZODone (DESYREL) 300 MG tablet Take 1 tablet (300 mg total) by mouth at bedtime as needed for sleep. 01/22/16   Shari Prows, MD  valACYclovir (VALTREX) 1000 MG tablet Take 1 tablet (1,000 mg total) by mouth as needed (with flare ups).  12/18/14   Dorathy KinsmanVirginia Smith, CNM    Family History Family History  Problem Relation Age of Onset  . Alcoholism    . Leukemia    . Cervical cancer    . Bone cancer    . Cirrhosis    . Diabetes Mellitus II    . Depression    . Hypertension    . Thyroid cancer      Social History Social History  Substance Use Topics  . Smoking status: Never Smoker  . Smokeless tobacco: Never Used  . Alcohol use No     Comment: 4 40's beer daily- not currently     Allergies   Other and Penicillins   Review of Systems Review of Systems  Musculoskeletal: Positive for arthralgias and myalgias. Negative for joint swelling.  Skin: Negative for wound.  Neurological: Negative for numbness.     Physical Exam Updated Vital Signs BP  123/93 (BP Location: Right Arm)   Pulse 86   Temp 98.5 F (36.9 C) (Oral)   Resp 18   SpO2 99%   Physical Exam  Constitutional: She appears well-developed and well-nourished.  HENT:  Head: Normocephalic.  Eyes: Conjunctivae are normal.  Cardiovascular: Normal rate.   Pulmonary/Chest: Effort normal. No respiratory distress.  Abdominal: She exhibits no distension.  Musculoskeletal: Normal range of motion.  Right wrist: No obvious swelling or deformity. Tenderness to palpation over medial wrist and distal forearm. No anatomical snuffbox tenderness. Decreased ROM of wrist. FROM of fingers. N/V intact.   Neurological: She is alert.  Skin: Skin is warm and dry.  Psychiatric: She has a normal mood and affect. Her behavior is normal.  Nursing note and vitals reviewed.    ED Treatments / Results  DIAGNOSTIC STUDIES: Oxygen Saturation is 99% on RA, normal by my interpretation.    COORDINATION OF CARE: 2:00 PM Discussed treatment plan with pt at bedside which includes brace and pt agreed to plan.  Labs (all labs ordered are listed, but only abnormal results are displayed) Labs Reviewed - No data to display  EKG  EKG Interpretation None       Radiology Dg Wrist Complete Right  Result Date: 02/17/2016 CLINICAL DATA:  Fall yesterday with wrist pain, initial encounter EXAM: RIGHT WRIST - COMPLETE 3+ VIEW COMPARISON:  10/23/2010 FINDINGS: There is no evidence of fracture or dislocation. There is no evidence of arthropathy or other focal bone abnormality. Soft tissues are unremarkable. IMPRESSION: No acute abnormality noted. Electronically Signed   By: Alcide CleverMark  Lukens M.D.   On: 02/17/2016 13:50    Procedures Procedures (including critical care time)  Medications Ordered in ED Medications  ibuprofen (ADVIL,MOTRIN) tablet 600 mg (600 mg Oral Given 02/17/16 1334)     Initial Impression / Assessment and Plan / ED Course  I have reviewed the triage vital signs and the nursing  notes.  Pertinent labs & imaging results that were available during my care of the patient were reviewed by me and considered in my medical decision making (see chart for details).  Clinical Course    40 year old female with wrist pain after a mechanical fall. Xray negative. Advised ice, wrist splint, anti-inflammatories. Patient is NAD, non-toxic, with stable VS. Patient is informed of clinical course, understands medical decision making process, and agrees with plan. Opportunity for questions provided and all questions answered. Return precautions given.   Final Clinical Impressions(s) / ED Diagnoses   Final diagnoses:  Right wrist pain    New Prescriptions Discharge Medication  List as of 02/17/2016  2:04 PM    START taking these medications   Details  meloxicam (MOBIC) 15 MG tablet Take 1 tablet (15 mg total) by mouth daily., Starting Sat 02/17/2016, Print       I personally performed the services described in this documentation, which was scribed in my presence. The recorded information has been reviewed and is accurate.    Bethel BornKelly Marie Daisee Centner, PA-C 02/17/16 1423    Shaune Pollackameron Isaacs, MD 02/18/16 [phone]

## 2016-02-17 NOTE — ED Notes (Signed)
Patient is alert and oriented x3.  She was given DC instructions and follow up visit instructions.  Patient gave verbal understanding. She was DC ambulatory under her own power to home.  V/S stable.  He was not showing any signs of distress on DC 

## 2016-02-17 NOTE — Progress Notes (Signed)
Orthopedic Tech Progress Note Patient Details:  Myrla HalstedKelly L Lyssy [birth year] [id number]  Ortho Devices Type of Ortho Device: Velcro wrist splint Ortho Device/Splint Location: Rt arm Ortho Device/Splint Interventions: Application   Clois Dupesvery S Sharnise Blough 02/17/2016, 2:18 PM

## 2016-02-17 NOTE — Discharge Instructions (Signed)
Take anti-inflammatory medicine for the next week. Take this medicine with food. Use brace for comfort and support Apply ice - apply for 20 minutes several times a day

## 2016-03-04 ENCOUNTER — Other Ambulatory Visit (HOSPITAL_COMMUNITY): Payer: Self-pay | Admitting: Nurse Practitioner

## 2016-03-04 DIAGNOSIS — E049 Nontoxic goiter, unspecified: Secondary | ICD-10-CM

## 2016-03-08 ENCOUNTER — Ambulatory Visit (HOSPITAL_COMMUNITY)
Admission: RE | Admit: 2016-03-08 | Discharge: 2016-03-08 | Disposition: A | Discharge status: Home / Self Care | Payer: Medicaid Other | Source: Ambulatory Visit | Attending: Nurse Practitioner | Admitting: Nurse Practitioner

## 2016-03-08 DIAGNOSIS — E049 Nontoxic goiter, unspecified: Secondary | ICD-10-CM

## 2016-03-09 ENCOUNTER — Other Ambulatory Visit: Payer: Self-pay | Admitting: Psychiatry

## 2016-03-16 ENCOUNTER — Encounter (HOSPITAL_COMMUNITY): Payer: Self-pay | Admitting: *Deleted

## 2016-03-16 ENCOUNTER — Emergency Department (HOSPITAL_COMMUNITY)
Admission: EM | Admit: 2016-03-16 | Discharge: 2016-03-16 | Disposition: A | Discharge status: Home / Self Care | Payer: Medicaid Other | Attending: Emergency Medicine | Admitting: Emergency Medicine

## 2016-03-16 DIAGNOSIS — J029 Acute pharyngitis, unspecified: Secondary | ICD-10-CM | POA: Diagnosis present

## 2016-03-16 DIAGNOSIS — R059 Cough, unspecified: Secondary | ICD-10-CM

## 2016-03-16 DIAGNOSIS — I10 Essential (primary) hypertension: Secondary | ICD-10-CM | POA: Diagnosis not present

## 2016-03-16 DIAGNOSIS — R05 Cough: Secondary | ICD-10-CM

## 2016-03-16 DIAGNOSIS — Z79899 Other long term (current) drug therapy: Secondary | ICD-10-CM | POA: Insufficient documentation

## 2016-03-16 NOTE — Discharge Instructions (Signed)
Throat lozenges as needed for dry/sore throat. Drink more water throughout the day.  Please follow up with your primary care provider for discussion of today's visit.  Return to ER for new or worsening symptoms, any additional concerns.

## 2016-03-16 NOTE — ED Triage Notes (Addendum)
Pt stated "I take my suboxone every day.  I feel like there's glass coming out of my fingers.  I feel like I have a dust ball in my throat like I've been exposed to something."

## 2016-03-16 NOTE — ED Notes (Signed)
Patient states that something is making her allergic when she got home. Patient also states that she was out all day and the family (two kids and her) were fine. Patient says she feels like something sharp in her throat. Patient states her throat is dry and she can hardly swallow.

## 2016-03-16 NOTE — ED Provider Notes (Signed)
[facility]-EMERGENCY DEPT Provider Note   CSN: [id number] Arrival date & time: 03/16/16  [id number]     History   Chief Complaint Chief Complaint  Patient presents with  . Chemical Exposure    HPI Megan Edwards is a 40 y.o. female.  The history is provided by the patient and medical records. No language interpreter was used.   Megan Edwards is a 40 y.o. female  with a PMH of bipolar disorder, HTN who presents to the Emergency Department complaining of potential allergic reaction. Patient states she was fine all day out shopping, but when she returned home, there was dust particles around the house. Patient began sneezing and eyes were itching. She then states her throat felt dry and is painful to swallow. She then drank a diet coke to help but this provided little relief. No chest pain, shortness of breath or oral swelling. No fevers. No new foods. No medications taken prior to arrival for symptoms.   Past Medical History:  Diagnosis Date  . Abnormal Pap smear   . Bipolar 1 disorder (HCC)   . Depression   . Genital herpes 2000  . Hypertension   . Infection    UTI  . Ovarian cyst   . SVT (supraventricular tachycardia) [facility](HCC)     Patient Active Problem List   Diagnosis Date Noted  . Opioid use disorder, mild, in sustained remission, on maintenance therapy 01/21/2016  . Amphetamine use disorder, moderate (HCC) 01/21/2016  . Bipolar I disorder, most recent episode manic, severe with psychotic features (HCC) 01/20/2016  . Chronic pain syndrome   . Morbid obesity due to excess calories (HCC)   . Polysubstance (including opioids) dependence with physiol dependence (HCC) 08/11/2011    Past Surgical History:  Procedure Laterality Date  . addenoidectomy    . BREAST ENHANCEMENT SURGERY    . BREAST SURGERY    . KNEE SURGERY    . KNEE SURGERY Left   . TONSILLECTOMY    . WISDOM TOOTH EXTRACTION      OB History    Gravida Para Term Preterm AB Living   5 2 2  0 3 2   SAB TAB  Ectopic Multiple Live Births   1 2 0 0 2       Home Medications    Prior to Admission medications   Medication Sig Start Date End Date Taking? Authorizing Provider  labetalol (NORMODYNE) 100 MG tablet Take 1 tablet (100 mg total) by mouth 2 (two) times daily. 01/22/16  Yes Jolanta B Pucilowska, MD  medroxyPROGESTERone (PROVERA) 10 MG tablet Take 1 tablet (10 mg total) by mouth daily. 10/10/15  Yes Brock Badharles A Harper, MD  meloxicam (MOBIC) 15 MG tablet Take 1 tablet (15 mg total) by mouth daily. 02/17/16  Yes Bethel BornKelly Marie Gekas, PA-C  phentermine 37.5 MG capsule Take 37.5 mg by mouth every morning.   Yes Historical Provider, MD  QUEtiapine (SEROQUEL) 200 MG tablet Take 1 tablet (200 mg total) by mouth 3 (three) times daily. 01/22/16  Yes Jolanta B Pucilowska, MD  SUBOXONE 8-2 MG FILM Place 2.5 Film under the tongue daily.  12/08/15  Yes Historical Provider, MD  traZODone (DESYREL) 300 MG tablet Take 1 tablet (300 mg total) by mouth at bedtime as needed for sleep. 01/22/16  Yes Jolanta B Pucilowska, MD  valACYclovir (VALTREX) 1000 MG tablet Take 1 tablet (1,000 mg total) by mouth as needed (with flare ups). 12/18/14  Yes Dorathy KinsmanVirginia Smith, CNM    Family History Family History  Problem Relation Age of Onset  . Alcoholism    . Leukemia    . Cervical cancer    . Bone cancer    . Cirrhosis    . Diabetes Mellitus II    . Depression    . Hypertension    . Thyroid cancer      Social History Social History  Substance Use Topics  . Smoking status: Never Smoker  . Smokeless tobacco: Never Used  . Alcohol use No     Comment: 4 40's beer daily- not currently     Allergies   Other and Penicillins   Review of Systems Review of Systems  Constitutional: Negative for chills and fever.  HENT: Positive for sore throat. Negative for congestion.   Eyes: Positive for itching. Negative for visual disturbance.  Respiratory: Positive for cough. Negative for shortness of breath.   Cardiovascular:  Negative.   Gastrointestinal: Negative for abdominal pain, nausea and vomiting.  Genitourinary: Negative for dysuria.  Musculoskeletal: Negative for back pain and neck pain.  Skin: Negative for rash.  Neurological: Negative for headaches.     Physical Exam Updated Vital Signs BP (!) 146/105 (BP Location: Right Arm)   Pulse 115   Temp 98.1 F (36.7 C) (Oral)   Resp (!) 181   Ht 5\' 7"  (1.702 m)   Wt 90.7 kg   LMP 03/09/2016 (Approximate)   SpO2 96%   BMI 31.32 kg/m   Physical Exam  Constitutional: She is oriented to person, place, and time. She appears well-developed and well-nourished. No distress.  HENT:  Head: Normocephalic and atraumatic.  Mouth/Throat: No oropharyngeal exudate.  Oropharynx clear with tacky mucous membranes. No oral swelling.   Eyes: Conjunctivae are normal. Pupils are equal, round, and reactive to light. Right eye exhibits no discharge. Left eye exhibits no discharge.  Cardiovascular: Normal rate, regular rhythm and normal heart sounds.   No murmur heard. Pulmonary/Chest: Effort normal and breath sounds normal. No respiratory distress. She has no wheezes. She has no rales.  Abdominal: Soft. She exhibits no distension. There is no tenderness.  Musculoskeletal: She exhibits no edema.  Neurological: She is alert and oriented to person, place, and time.  Skin: Skin is warm and dry.  Nursing note and vitals reviewed.    ED Treatments / Results  Labs (all labs ordered are listed, but only abnormal results are displayed) Labs Reviewed - No data to display  EKG  EKG Interpretation None       Radiology No results found.  Procedures Procedures (including critical care time)  Medications Ordered in ED Medications - No data to display   Initial Impression / Assessment and Plan / ED Course  I have reviewed the triage vital signs and the nursing notes.  Pertinent labs & imaging results that were available during my care of the patient were  reviewed by me and considered in my medical decision making (see chart for details).  Clinical Course    Megan Edwards is a 40 y.o. female who presents to ED for sneezing, eye irritation, sore throat and cough which began after she inhaled "dust particles" at home. On exam, lungs CTA bilaterally. No oral swelling. OP patent with minimal erythema. Plan: Benadryl PRN. Increase hydration. PCP follow up. Return precautions discussed.    Final Clinical Impressions(s) / ED Diagnoses   Final diagnoses:  Sore throat  Cough    New Prescriptions Discharge Medication List as of 03/16/2016  7:48 AM  [facility] Center LPJaime Pilcher Keniya Schlotterbeck, PA-C 03/16/16 [id number]    Lorre NickAnthony Allen, MD 03/17/16 [phone]

## 2016-04-12 ENCOUNTER — Emergency Department (HOSPITAL_COMMUNITY)
Admission: EM | Admit: 2016-04-12 | Discharge: 2016-04-12 | Disposition: A | Discharge status: Home / Self Care | Payer: Medicaid Other | Attending: Emergency Medicine | Admitting: Emergency Medicine

## 2016-04-12 ENCOUNTER — Encounter (HOSPITAL_COMMUNITY): Payer: Self-pay | Admitting: Emergency Medicine

## 2016-04-12 DIAGNOSIS — R197 Diarrhea, unspecified: Secondary | ICD-10-CM

## 2016-04-12 DIAGNOSIS — I10 Essential (primary) hypertension: Secondary | ICD-10-CM | POA: Diagnosis not present

## 2016-04-12 DIAGNOSIS — Z79899 Other long term (current) drug therapy: Secondary | ICD-10-CM | POA: Insufficient documentation

## 2016-04-12 DIAGNOSIS — R112 Nausea with vomiting, unspecified: Secondary | ICD-10-CM

## 2016-04-12 DIAGNOSIS — A084 Viral intestinal infection, unspecified: Secondary | ICD-10-CM | POA: Insufficient documentation

## 2016-04-12 LAB — URINALYSIS, ROUTINE W REFLEX MICROSCOPIC
Bilirubin Urine: NEGATIVE
GLUCOSE, UA: NEGATIVE mg/dL
KETONES UR: 5 mg/dL — AB
Leukocytes, UA: NEGATIVE
NITRITE: NEGATIVE
Specific Gravity, Urine: 1.033 — ABNORMAL HIGH (ref 1.005–1.030)
pH: 5 (ref 5.0–8.0)

## 2016-04-12 LAB — COMPREHENSIVE METABOLIC PANEL
ALK PHOS: 87 U/L (ref 38–126)
ALT: 44 U/L (ref 14–54)
ANION GAP: 15 (ref 5–15)
AST: 53 U/L — ABNORMAL HIGH (ref 15–41)
Albumin: 4.6 g/dL (ref 3.5–5.0)
BUN: 12 mg/dL (ref 6–20)
CALCIUM: 9.6 mg/dL (ref 8.9–10.3)
CO2: 23 mmol/L (ref 22–32)
CREATININE: 0.82 mg/dL (ref 0.44–1.00)
Chloride: 98 mmol/L — ABNORMAL LOW (ref 101–111)
GFR calc non Af Amer: >60 mL/min (ref >60)
Glucose, Bld: 114 mg/dL — ABNORMAL HIGH (ref 65–99)
Potassium: 3.1 mmol/L — ABNORMAL LOW (ref 3.5–5.1)
SODIUM: 136 mmol/L (ref 135–145)
Total Bilirubin: 0.8 mg/dL (ref 0.3–1.2)
Total Protein: 7.8 g/dL (ref 6.5–8.1)

## 2016-04-12 LAB — I-STAT CG4 LACTIC ACID, ED: Lactic Acid, Venous: 1.07 mmol/L (ref 0.5–1.9)

## 2016-04-12 LAB — CBC WITH DIFFERENTIAL/PLATELET
Basophils Absolute: 0 10*3/uL (ref 0.0–0.1)
Basophils Relative: 0 %
EOS ABS: 0 10*3/uL (ref 0.0–0.7)
EOS PCT: 0 %
HCT: 38 % (ref 36.0–46.0)
HEMOGLOBIN: 13.2 g/dL (ref 12.0–15.0)
LYMPHS ABS: 1.9 10*3/uL (ref 0.7–4.0)
LYMPHS PCT: 16 %
MCH: 27.9 pg (ref 26.0–34.0)
MCHC: 34.7 g/dL (ref 30.0–36.0)
MCV: 80.3 fL (ref 78.0–100.0)
MONOS PCT: 7 %
Monocytes Absolute: 0.8 10*3/uL (ref 0.1–1.0)
Neutro Abs: 8.9 10*3/uL — ABNORMAL HIGH (ref 1.7–7.7)
Neutrophils Relative %: 77 %
PLATELETS: 316 10*3/uL (ref 150–400)
RBC: 4.73 MIL/uL (ref 3.87–5.11)
RDW: 13.7 % (ref 11.5–15.5)
WBC: 11.6 10*3/uL — ABNORMAL HIGH (ref 4.0–10.5)

## 2016-04-12 LAB — RAPID URINE DRUG SCREEN, HOSP PERFORMED
AMPHETAMINES: POSITIVE — AB
BENZODIAZEPINES: NOT DETECTED
Barbiturates: NOT DETECTED
COCAINE: NOT DETECTED
OPIATES: NOT DETECTED
Tetrahydrocannabinol: NOT DETECTED

## 2016-04-12 LAB — SALICYLATE LEVEL: Salicylate Lvl: <7 mg/dL (ref 2.8–30.0)

## 2016-04-12 LAB — ACETAMINOPHEN LEVEL

## 2016-04-12 LAB — I-STAT BETA HCG BLOOD, ED (MC, WL, AP ONLY)

## 2016-04-12 LAB — LIPASE, BLOOD: LIPASE: 16 U/L (ref 11–51)

## 2016-04-12 MED ORDER — PROMETHAZINE HCL 25 MG PO TABS: 25.0000 mg | ORAL_TABLET | Freq: Three times a day (TID) | ORAL | 0 refills | Status: DC | PRN

## 2016-04-12 MED ORDER — ONDANSETRON 4 MG PO TBDP
8.0000 mg | ORAL_TABLET | Freq: Once | ORAL | Status: AC
  Administered 2016-04-12: 8 mg via ORAL
  Filled 2016-04-12: qty 2

## 2016-04-12 MED ORDER — SODIUM CHLORIDE 0.9 % IV BOLUS (SEPSIS)
1000.0000 mL | Freq: Once | INTRAVENOUS | Status: AC
  Administered 2016-04-12: 1000 mL via INTRAVENOUS

## 2016-04-12 NOTE — ED Triage Notes (Signed)
Pt presents with multiple complaints. Endorses n/v/d x 3 days, right ear pain and a sore throat.

## 2016-04-12 NOTE — ED Provider Notes (Signed)
[facility]-EMERGENCY DEPT Provider Note   CSN: [id number] Arrival date & time: 04/12/16  5621     History   Chief Complaint Chief Complaint  Patient presents with  . Nausea    HPI Megan Edwards is a 41 y.o. female with a hx of Bipolar disorder, depression, hypertension, ovarian cyst presents to the Emergency Department complaining of gradual, persistent, progressively worsening nausea vomiting and diarrhea onset 3 days ago. Associated symptoms include  sore throat and right ear pain. Patient additionally reports that she has periodic rash over most of her body. She states this rash began 6 days ago when she turns the heat on in her house and it was moldy.  Patient reports her 2 children are sick with the same. None of them have seen Dr. No treatment prior to arrival.  No aggravating or alleviating factors. She denies fever, chills, chest pain, shortness of breath, abdominal pain, weakness, dizziness, syncope. Patient reports she is prescribed Suboxone and did take it today as prescribed however today it made her feel "strange."    The history is provided by the patient and medical records. No language interpreter was used.    Past Medical History:  Diagnosis Date  . Abnormal Pap smear   . Bipolar 1 disorder (HCC)   . Depression   . Genital herpes 2000  . Hypertension   . Infection    UTI  . Ovarian cyst   . SVT (supraventricular tachycardia) [facility])     Patient Active Problem List   Diagnosis Date Noted  . Opioid use disorder, mild, in sustained remission, on maintenance therapy 01/21/2016  . Amphetamine use disorder, moderate (HCC) 01/21/2016  . Bipolar I disorder, most recent episode manic, severe with psychotic features (HCC) 01/20/2016  . Chronic pain syndrome   . Morbid obesity due to excess calories (HCC)   . Polysubstance (including opioids) dependence with physiol dependence (HCC) 08/11/2011    Past Surgical History:  Procedure Laterality Date  . addenoidectomy      . BREAST ENHANCEMENT SURGERY    . BREAST SURGERY    . KNEE SURGERY    . KNEE SURGERY Left   . TONSILLECTOMY    . WISDOM TOOTH EXTRACTION      OB History    Gravida Para Term Preterm AB Living   5 2 2  0 3 2   SAB TAB Ectopic Multiple Live Births   1 2 0 0 2       Home Medications    Prior to Admission medications   Medication Sig Start Date End Date Taking? Authorizing Provider  labetalol (NORMODYNE) 100 MG tablet Take 1 tablet (100 mg total) by mouth 2 (two) times daily. 01/22/16   Shari Prows, MD  medroxyPROGESTERone (PROVERA) 10 MG tablet Take 1 tablet (10 mg total) by mouth daily. 10/10/15   Brock Bad, MD  meloxicam (MOBIC) 15 MG tablet Take 1 tablet (15 mg total) by mouth daily. 02/17/16   Bethel Born, PA-C  phentermine 37.5 MG capsule Take 37.5 mg by mouth every morning.    Historical Provider, MD  QUEtiapine (SEROQUEL) 200 MG tablet Take 1 tablet (200 mg total) by mouth 3 (three) times daily. 01/22/16   Jolanta B Pucilowska, MD  SUBOXONE 8-2 MG FILM Place 2.5 Film under the tongue daily.  12/08/15   Historical Provider, MD  traZODone (DESYREL) 300 MG tablet Take 1 tablet (300 mg total) by mouth at bedtime as needed for sleep. 01/22/16   Jolanta B  Pucilowska, MD  valACYclovir (VALTREX) 1000 MG tablet Take 1 tablet (1,000 mg total) by mouth as needed (with flare ups). 12/18/14   Dorathy KinsmanVirginia Smith, CNM    Family History Family History  Problem Relation Age of Onset  . Alcoholism    . Leukemia    . Cervical cancer    . Bone cancer    . Cirrhosis    . Diabetes Mellitus II    . Depression    . Hypertension    . Thyroid cancer      Social History Social History  Substance Use Topics  . Smoking status: Never Smoker  . Smokeless tobacco: Never Used  . Alcohol use No     Comment: 4 40's beer daily- not currently     Allergies   Other and Penicillins   Review of Systems Review of Systems  HENT: Positive for ear pain and sore throat.    Gastrointestinal: Positive for diarrhea, nausea and vomiting.  All other systems reviewed and are negative.    Physical Exam Updated Vital Signs BP (!) 170/109 (BP Location: Right Arm)   Pulse 108   Temp 98.1 F (36.7 C) (Oral)   Resp 16   LMP 03/09/2016   SpO2 100%   Physical Exam  Constitutional: She appears well-developed and well-nourished. No distress.  Awake, alert, nontoxic appearance  HENT:  Head: Normocephalic and atraumatic.  Mouth/Throat: Oropharynx is clear and moist. Mucous membranes are dry. No oropharyngeal exudate.  Eyes: Conjunctivae are normal. No scleral icterus.  Neck: Normal range of motion. Neck supple.  Cardiovascular: Regular rhythm and intact distal pulses.  Tachycardia present.   No murmur heard. Pulses:      Radial pulses are 2+ on the right side, and 2+ on the left side.  Pulmonary/Chest: Effort normal and breath sounds normal. No respiratory distress. She has no wheezes.  Equal chest expansion  Abdominal: Soft. Bowel sounds are normal. She exhibits no mass. There is no tenderness. There is no rebound and no guarding.  Musculoskeletal: Normal range of motion. She exhibits no edema.  Neurological: She is alert.  Speech is clear and goal oriented Moves extremities without ataxia  Skin: Skin is warm and dry. Rash noted. She is not diaphoretic.  Erythematous, raised rash in splotches along the legs, arms, chest and back No petechiae or purpura No vesicles or bulla  Psychiatric: She has a normal mood and affect.  Nursing note and vitals reviewed.    ED Treatments / Results  Labs (all labs ordered are listed, but only abnormal results are displayed) Labs Reviewed  CBC WITH DIFFERENTIAL/PLATELET  COMPREHENSIVE METABOLIC PANEL  LIPASE, BLOOD  URINALYSIS, ROUTINE W REFLEX MICROSCOPIC  RAPID URINE DRUG SCREEN, HOSP PERFORMED  I-STAT BETA HCG BLOOD, ED ([facility], [facility], [facility] ONLY)  I-STAT CG4 LACTIC ACID, ED    Procedures Procedures (including critical  care time)  Medications Ordered in ED Medications  sodium chloride 0.9 % bolus 1,000 mL (not administered)  ondansetron (ZOFRAN-ODT) disintegrating tablet 8 mg (8 mg Oral Given 04/12/16 0549)     Initial Impression / Assessment and Plan / ED Course  I have reviewed the triage vital signs and the nursing notes.  Pertinent labs & imaging results that were available during my care of the patient were reviewed by me and considered in my medical decision making (see chart for details).  Clinical Course as of Apr 12 604  Fri Apr 12, 2016  0604 Labs pending.  At shift change care transferred to  Ebbie Ridge, PA-C.    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 She presents today with nausea, vomiting and diarrhea. Dry mucous membranes. Tachycardia. She is afebrile and hypertensive. Suspect some level of dehydration. Labs are pending. Children are with her and have similar symptoms.  She is alert and oriented, but seems very paranoid.    Final Clinical Impressions(s) / ED Diagnoses   Final diagnoses:  Nausea vomiting and diarrhea    New Prescriptions New Prescriptions   No medications on file     Dierdre Forth, PA-C 04/12/16 9604    Tomasita Crumble, MD 04/12/16 [phone]

## 2016-04-12 NOTE — ED Notes (Addendum)
Pt c/o vaginal burning and itching, EDP aware. Pt appears very anxious. Denies SI/HI/AVH.

## 2016-04-12 NOTE — Discharge Instructions (Signed)
Return here as needed. Follow up with your doctor. °

## 2016-04-14 ENCOUNTER — Encounter (HOSPITAL_COMMUNITY): Payer: Self-pay | Admitting: Oncology

## 2016-04-14 ENCOUNTER — Emergency Department (HOSPITAL_COMMUNITY)
Admission: EM | Admit: 2016-04-14 | Discharge: 2016-04-15 | Disposition: A | Discharge status: Home / Self Care | Payer: Medicaid Other | Attending: Emergency Medicine | Admitting: Emergency Medicine

## 2016-04-14 DIAGNOSIS — Z79899 Other long term (current) drug therapy: Secondary | ICD-10-CM | POA: Diagnosis not present

## 2016-04-14 DIAGNOSIS — R197 Diarrhea, unspecified: Secondary | ICD-10-CM | POA: Diagnosis not present

## 2016-04-14 DIAGNOSIS — I10 Essential (primary) hypertension: Secondary | ICD-10-CM | POA: Diagnosis not present

## 2016-04-14 DIAGNOSIS — R112 Nausea with vomiting, unspecified: Secondary | ICD-10-CM | POA: Diagnosis present

## 2016-04-14 LAB — CBC WITH DIFFERENTIAL/PLATELET
BASOS ABS: 0 10*3/uL (ref 0.0–0.1)
Basophils Relative: 0 %
Eosinophils Absolute: 0.1 10*3/uL (ref 0.0–0.7)
Eosinophils Relative: 1 %
HEMATOCRIT: 36.2 % (ref 36.0–46.0)
HEMOGLOBIN: 12.3 g/dL (ref 12.0–15.0)
LYMPHS PCT: 7 %
Lymphs Abs: 0.5 10*3/uL — ABNORMAL LOW (ref 0.7–4.0)
MCH: 27.9 pg (ref 26.0–34.0)
MCHC: 34 g/dL (ref 30.0–36.0)
MCV: 82.1 fL (ref 78.0–100.0)
MONO ABS: 0.3 10*3/uL (ref 0.1–1.0)
MONOS PCT: 4 %
NEUTROS ABS: 7.5 10*3/uL (ref 1.7–7.7)
Neutrophils Relative %: 88 %
PLATELETS: 229 10*3/uL (ref 150–400)
RBC: 4.41 MIL/uL (ref 3.87–5.11)
RDW: 13.6 % (ref 11.5–15.5)
WBC: 8.4 10*3/uL (ref 4.0–10.5)

## 2016-04-14 LAB — URINALYSIS, ROUTINE W REFLEX MICROSCOPIC
BILIRUBIN URINE: NEGATIVE
Glucose, UA: NEGATIVE mg/dL
HGB URINE DIPSTICK: NEGATIVE
Ketones, ur: NEGATIVE mg/dL
Leukocytes, UA: NEGATIVE
Nitrite: NEGATIVE
PROTEIN: NEGATIVE mg/dL
Specific Gravity, Urine: 1.012 (ref 1.005–1.030)
pH: 5 (ref 5.0–8.0)

## 2016-04-14 MED ORDER — ONDANSETRON HCL 4 MG/2ML IJ SOLN
4.0000 mg | Freq: Once | INTRAMUSCULAR | Status: AC
  Administered 2016-04-14: 4 mg via INTRAVENOUS
  Filled 2016-04-14: qty 2

## 2016-04-14 MED ORDER — SODIUM CHLORIDE 0.9 % IV BOLUS (SEPSIS)
1000.0000 mL | Freq: Once | INTRAVENOUS | Status: AC
  Administered 2016-04-14: 1000 mL via INTRAVENOUS

## 2016-04-14 NOTE — ED Triage Notes (Signed)
Pt can barely keep her eyes open in triage.  Pt called for triage x 2, pt was not in the lobby or outside.  Pt has multiple c/o.  States she has mold in her house and needs, "My kidneys flushed."  Pt then states that she has N/V/D.  Pt's story is inconsistent and again continues to fall asleep.

## 2016-04-14 NOTE — ED Provider Notes (Signed)
[facility]-EMERGENCY DEPT Provider Note   CSN: [id number] Arrival date & time: 04/14/16  1955     History   Chief Complaint Chief Complaint  Patient presents with  . Diarrhea    HPI Megan Edwards is a 41 y.o. female.  HPI  41 year female female with history of bipolar disorder, depression who presents with Nausea, vomiting, and diarrhea. She was seen in the take for nausea, vomiting, diarrhea and states that her children have been sick with similar illness recently. Received IV fluids and antibiotics, and said that initially she felt well. States that she was exposed to black mold in her home today, and thinks that that made her vomiting and diarrhea worse today. She did not take her medications because she left them in her car. Has not had fever. Has had abdominal cramping associated with vomiting and diarrhea. No dysuria but has had some mild urinary frequency.  Past Medical History:  Diagnosis Date  . Abnormal Pap smear   . Bipolar 1 disorder (HCC)   . Depression   . Genital herpes 2000  . Hypertension   . Infection    UTI  . Ovarian cyst   . SVT (supraventricular tachycardia) [facility])     Patient Active Problem List   Diagnosis Date Noted  . Opioid use disorder, mild, in sustained remission, on maintenance therapy 01/21/2016  . Amphetamine use disorder, moderate (HCC) 01/21/2016  . Bipolar I disorder, most recent episode manic, severe with psychotic features (HCC) 01/20/2016  . Chronic pain syndrome   . Morbid obesity due to excess calories (HCC)   . Polysubstance (including opioids) dependence with physiol dependence (HCC) 08/11/2011    Past Surgical History:  Procedure Laterality Date  . addenoidectomy    . BREAST ENHANCEMENT SURGERY    . BREAST SURGERY    . KNEE SURGERY    . KNEE SURGERY Left   . TONSILLECTOMY    . WISDOM TOOTH EXTRACTION      OB History    Gravida Para Term Preterm AB Living   5 2 2  0 3 2   SAB TAB Ectopic Multiple Live Births   1 2 0 0 2        Home Medications    Prior to Admission medications   Medication Sig Start Date End Date Taking? Authorizing Provider  labetalol (NORMODYNE) 100 MG tablet Take 1 tablet (100 mg total) by mouth 2 (two) times daily. 01/22/16  Yes Jolanta B Pucilowska, MD  QUEtiapine (SEROQUEL) 200 MG tablet Take 1 tablet (200 mg total) by mouth 3 (three) times daily. 01/22/16  Yes Jolanta B Pucilowska, MD  SUBOXONE 8-2 MG FILM Place 2.5 Film under the tongue daily.  12/08/15  Yes Historical Provider, MD  medroxyPROGESTERone (PROVERA) 10 MG tablet Take 1 tablet (10 mg total) by mouth daily. Patient not taking: Reported on 04/14/2016 10/10/15   Brock Bad, MD  meloxicam (MOBIC) 15 MG tablet Take 1 tablet (15 mg total) by mouth daily. Patient not taking: Reported on 04/14/2016 02/17/16   Bethel Born, PA-C  promethazine (PHENERGAN) 25 MG tablet Take 1 tablet (25 mg total) by mouth every 8 (eight) hours as needed for nausea or vomiting. Patient not taking: Reported on 04/14/2016 04/12/16   Charlestine Night, PA-C  traZODone (DESYREL) 300 MG tablet Take 1 tablet (300 mg total) by mouth at bedtime as needed for sleep. Patient not taking: Reported on 04/14/2016 01/22/16   Shari Prows, MD  valACYclovir (VALTREX) 1000 MG tablet  Take 1 tablet (1,000 mg total) by mouth as needed (with flare ups). Patient not taking: Reported on 04/14/2016 12/18/14   Dorathy Kinsman, CNM    Family History Family History  Problem Relation Age of Onset  . Alcoholism    . Leukemia    . Cervical cancer    . Bone cancer    . Cirrhosis    . Diabetes Mellitus II    . Depression    . Hypertension    . Thyroid cancer      Social History Social History  Substance Use Topics  . Smoking status: Never Smoker  . Smokeless tobacco: Never Used  . Alcohol use No     Comment: 4 40's beer daily- not currently     Allergies   Other and Penicillins   Review of Systems Review of Systems 10/14 systems reviewed and  are negative other than those stated in the HPI   Physical Exam Updated Vital Signs BP 127/76 (BP Location: Right Arm)   Pulse 110   Temp 99.6 F (37.6 C) (Oral)   Resp 20   Ht 5\' 7"  (1.702 m)   Wt 200 lb (90.7 kg)   LMP 03/09/2016 (LMP Unknown)   SpO2 96%   BMI 31.32 kg/m   Physical Exam  Physical Exam  Nursing note and vitals reviewed. Constitutional: Well developed, well nourished, non-toxic, and in no acute distress Head: Normocephalic and atraumatic.  Mouth/Throat: Oropharynx is clear and moist.  Neck: Normal range of motion. Neck supple.  Cardiovascular: Normal rate and regular rhythm.   Pulmonary/Chest: Effort normal and breath sounds normal.  Abdominal: Soft. There is no tenderness. There is no rebound and no guarding.  Musculoskeletal: Normal range of motion.  Neurological: Alert, no facial droop, fluent speech, moves all extremities symmetrically Skin: Skin is warm and dry.  Psychiatric: Cooperative  ED Treatments / Results  Labs (all labs ordered are listed, but only abnormal results are displayed) Labs Reviewed  CBC WITH DIFFERENTIAL/PLATELET - Abnormal; Notable for the following:       Result Value   Lymphs Abs 0.5 (*)    All other components within normal limits  COMPREHENSIVE METABOLIC PANEL - Abnormal; Notable for the following:    Potassium 3.3 (*)    Chloride 98 (*)    Glucose, Bld 116 (*)    Calcium 8.2 (*)    All other components within normal limits  URINALYSIS, ROUTINE W REFLEX MICROSCOPIC - Abnormal; Notable for the following:    APPearance HAZY (*)    All other components within normal limits  LIPASE, BLOOD  I-STAT BETA HCG BLOOD, ED ([facility], [facility], [facility] ONLY)    EKG  EKG Interpretation None       Radiology No results found.  Procedures Procedures (including critical care time)  Medications Ordered in ED Medications  promethazine (PHENERGAN) injection 25 mg (not administered)  sodium chloride 0.9 % bolus 1,000 mL (1,000 mLs  Intravenous New Bag/Given 04/14/16 2347)  ondansetron (ZOFRAN) injection 4 mg (4 mg Intravenous Given 04/14/16 2347)  acetaminophen (TYLENOL) tablet 1,000 mg (1,000 mg Oral Given 04/15/16 0046)     Initial Impression / Assessment and Plan / ED Course  I have reviewed the triage vital signs and the nursing notes.  Pertinent labs & imaging results that were available during my care of the patient were reviewed by me and considered in my medical decision making (see chart for details).    Returns ED for persistent nausea, vomiting, and diarrhea. Is  nontoxic in no acute distress. She has a soft and benign abdomen I do not suspect emergent infectious or surgical etiology of her pain, such as appendicitis, obstruction, diverticulitis. UA without signs of infection. Blood work is reassuring. Still within time course to have persistent symptoms. Given IV fluids and antiemetics. Tolerating crackers and soda. We'll discharge home with continued supportive care.   Final Clinical Impressions(s) / ED Diagnoses   Final diagnoses:  Nausea vomiting and diarrhea    New Prescriptions New Prescriptions   No medications on file     Lavera Guiseana Duo Louann Hopson, MD 04/15/16 [phone]

## 2016-04-14 NOTE — ED Notes (Signed)
Pt called multiple times, no response from lobby

## 2016-04-14 NOTE — ED Notes (Signed)
Pt w/ pinpoint pupils.  Informed pt that she needed to stay in the lobby.

## 2016-04-15 ENCOUNTER — Encounter (HOSPITAL_COMMUNITY): Payer: Self-pay | Admitting: Emergency Medicine

## 2016-04-15 ENCOUNTER — Emergency Department (HOSPITAL_COMMUNITY)
Admission: EM | Admit: 2016-04-15 | Discharge: 2016-04-16 | Disposition: A | Discharge status: Home / Self Care | Payer: Medicaid Other | Source: Home / Self Care | Attending: Emergency Medicine | Admitting: Emergency Medicine

## 2016-04-15 ENCOUNTER — Ambulatory Visit (HOSPITAL_COMMUNITY)
Admission: RE | Admit: 2016-04-15 | Discharge: 2016-04-15 | Disposition: A | Discharge status: Home / Self Care | Payer: Medicaid Other | Attending: Psychiatry | Admitting: Psychiatry

## 2016-04-15 DIAGNOSIS — R112 Nausea with vomiting, unspecified: Secondary | ICD-10-CM

## 2016-04-15 DIAGNOSIS — I10 Essential (primary) hypertension: Secondary | ICD-10-CM

## 2016-04-15 DIAGNOSIS — Z1389 Encounter for screening for other disorder: Secondary | ICD-10-CM | POA: Insufficient documentation

## 2016-04-15 DIAGNOSIS — Z79899 Other long term (current) drug therapy: Secondary | ICD-10-CM

## 2016-04-15 DIAGNOSIS — F1120 Opioid dependence, uncomplicated: Secondary | ICD-10-CM | POA: Diagnosis not present

## 2016-04-15 DIAGNOSIS — E876 Hypokalemia: Secondary | ICD-10-CM

## 2016-04-15 DIAGNOSIS — F319 Bipolar disorder, unspecified: Secondary | ICD-10-CM | POA: Insufficient documentation

## 2016-04-15 DIAGNOSIS — I471 Supraventricular tachycardia: Secondary | ICD-10-CM | POA: Diagnosis not present

## 2016-04-15 DIAGNOSIS — R197 Diarrhea, unspecified: Secondary | ICD-10-CM

## 2016-04-15 LAB — COMPREHENSIVE METABOLIC PANEL
ALBUMIN: 3.9 g/dL (ref 3.5–5.0)
ALT: 42 U/L (ref 14–54)
ANION GAP: 7 (ref 5–15)
AST: 34 U/L (ref 15–41)
Alkaline Phosphatase: 70 U/L (ref 38–126)
BUN: 11 mg/dL (ref 6–20)
CO2: 30 mmol/L (ref 22–32)
Calcium: 8.2 mg/dL — ABNORMAL LOW (ref 8.9–10.3)
Chloride: 98 mmol/L — ABNORMAL LOW (ref 101–111)
Creatinine, Ser: 0.61 mg/dL (ref 0.44–1.00)
GFR calc Af Amer: >60 mL/min (ref >60)
GFR calc non Af Amer: >60 mL/min (ref >60)
GLUCOSE: 116 mg/dL — AB (ref 65–99)
POTASSIUM: 3.3 mmol/L — AB (ref 3.5–5.1)
SODIUM: 135 mmol/L (ref 135–145)
TOTAL PROTEIN: 6.9 g/dL (ref 6.5–8.1)
Total Bilirubin: 0.7 mg/dL (ref 0.3–1.2)

## 2016-04-15 LAB — I-STAT BETA HCG BLOOD, ED (MC, WL, AP ONLY): I-stat hCG, quantitative: <5 m[IU]/mL (ref <5)

## 2016-04-15 LAB — LIPASE, BLOOD: Lipase: 24 U/L (ref 11–51)

## 2016-04-15 MED ORDER — ACETAMINOPHEN 500 MG PO TABS
1000.0000 mg | ORAL_TABLET | Freq: Once | ORAL | Status: AC
  Administered 2016-04-15: 1000 mg via ORAL
  Filled 2016-04-15: qty 2

## 2016-04-15 MED ORDER — PROMETHAZINE HCL 25 MG/ML IJ SOLN
25.0000 mg | Freq: Once | INTRAMUSCULAR | Status: AC
  Administered 2016-04-15: 25 mg via INTRAVENOUS
  Filled 2016-04-15 (×2): qty 1

## 2016-04-15 MED ORDER — PROMETHAZINE HCL 6.25 MG/5ML PO SYRP: 25.0000 mg | ORAL_SOLUTION | Freq: Four times a day (QID) | ORAL | 0 refills | Status: DC | PRN

## 2016-04-15 NOTE — ED Notes (Signed)
No reaction to medication noted still eating graham crackers given no vomiting noted.

## 2016-04-15 NOTE — ED Notes (Signed)
No respiratory or acute distress noted resting in bed with eyes closed call light in reach drinking ginger ale when awake and eating graham crackers no vomiting noted pt states nausea.

## 2016-04-15 NOTE — Consult Note (Signed)
Behavioral Health Medical Screening Exam  Megan Edwards is an 41 y.o. female.  Total Time spent with patient: 15 minutes  Psychiatric Specialty Exam: Physical Exam  ROS  Last menstrual period 03/09/2016, not currently breastfeeding.There is no height or weight on file to calculate BMI.  General Appearance: Casual and Fairly Groomed  Eye Contact:  Good  Speech:  Clear and Coherent and Normal Rate  Volume:  Normal  Mood:  Anxious  Affect:  Appropriate and Congruent  Thought Process:  Coherent, Goal Directed, Linear and Descriptions of Associations: Intact  Orientation:  Full (Time, Place, and Person)  Thought Content:  Focused on suboxone prescriptions  Suicidal Thoughts:  No  Homicidal Thoughts:  No  Memory:  Immediate;   Fair Recent;   Fair Remote;   Fair  Judgement:  Fair  Insight:  Fair  Psychomotor Activity:  Normal  Concentration: Concentration: Fair and Attention Span: Fair  Recall:  FiservFair  Fund of Knowledge:Fair  Language: Fair  Akathisia:  No  Handed:    AIMS (if indicated):     Assets:  Communication Skills Desire for Improvement Resilience Social Support  Sleep:       Musculoskeletal: Strength & Muscle Tone: within normal limits Gait & Station: normal Patient leans: N/A  Last menstrual period 03/09/2016, not currently breastfeeding.  Recommendations:  *[facility], stayed from approximately 11:25pm to 1:00am. Pt was discharged and cleared with no psychiatric or medical concerns which would warrant further evaluation or treatment.  Today, pt presents here asking to be tapered off Suboxone, When asked why she didn't go to her prescriber, she reported she was going tomorrow. She reports that she is hearing voices, but demonstrates no signs of internal preoccupation or distraction, nor irritability. Pt continues to ask if we can prescribe her suboxone and taper her down and changes the topic when asked more details about her  prescriber.   I informed the pt that we would do this brief medical screening exam. She then reported, during the exam, that she "had to go to the hospital". I asked what she meant and she indicated she wanted to go to the ED. Pt is still wearing the [facility] bracelet from last night, stating the wasn't cared for. When asked why she was sent home, she reports "I was medically cleared, not psychiatrically cleared." Pt then reported "I guess I'm going to leave so I can go pick up my kids, then go to the hospital again."  I called the [facility] to notify them of the situation with potential secondary gain and inconsistent statements. [facility] staff aware of the situation.   Based on my evaluation the patient does not appear to have an emergency medical condition.  Beau FannyWithrow, Chrisie Jankovich C, FNP 04/15/2016, 2:03 PM

## 2016-04-15 NOTE — ED Triage Notes (Signed)
Pt states that she last took suboxone 2 days ago.  Wants detox.  Has taken adderall 2 days ago.  Pt was just seen last night and discharged.  [facility] and states that she was told to come over here and "wait for a bed".  Was seen by Renata Capriceonrad, NP who states that he did not send her over for a bed but actually cleared her and gave her outpatient resources.  Also states that she thinks there are toxins in her house.  States that she feels "woozy" because of it.

## 2016-04-15 NOTE — Discharge Instructions (Signed)
Please take nausea medication as prescribed. Talk to your doctors about tapering off of suboxone rather than quitting cold [place].  Return for worsening symptoms, including fever, intractable vomiting, worsening abdominal pain or any other symptoms concerning to you.

## 2016-04-15 NOTE — BH Assessment (Signed)
Tele Assessment Note   Megan Edwards is an 41 y.o. female [facility]. Patient presented to the ED last night in the [facility], requesting detox from Suboxone. Patient was discharged with a plan to follow up with her outpatient suboxone provider, [facility]  Patient has been on a Suboxone treatment for 6 to 8 yrs. [facility], for Bipolar.   Patient denies SI, HI or A/V. She reports others describe her behavior as erratic but this was not noted during the assessment.   Patient states she in a never ending cycle on Suboxone treatment and wants to stop for her children, ages 569 months and 3 yrs.  Patient started using opioids years ago, Narco 10's.  Patient has not used suboxone in 2 days, reports symptoms of decreased sleep, dry mouth, joint pain, diarrhea.   Megan Headonrad Withrow, DNP referred patient back to [facility] for titrate off suboxone.     Diagnosis: Bipolar Disorder, Opioid Dependence  Past Medical History:  Past Medical History:  Diagnosis Date  . Abnormal Pap smear   . Bipolar 1 disorder (HCC)   . Depression   . Genital herpes 2000  . Hypertension   . Infection    UTI  . Ovarian cyst   . SVT (supraventricular tachycardia) (HCC)     Past Surgical History:  Procedure Laterality Date  . addenoidectomy    . BREAST ENHANCEMENT SURGERY    . BREAST SURGERY    . KNEE SURGERY    . KNEE SURGERY Left   . TONSILLECTOMY    . WISDOM TOOTH EXTRACTION      Family History:  Family History  Problem Relation Age of Onset  . Alcoholism    . Leukemia    . Cervical cancer    . Bone cancer    . Cirrhosis    . Diabetes Mellitus II    . Depression    . Hypertension    . Thyroid cancer      Social History:  reports that she has never smoked. She has never used smokeless tobacco. She reports that she uses drugs. She reports that she does not drink alcohol.  Additional  Social History:  Alcohol / Drug Use Pain Medications: see MAR Prescriptions: see MAR Over the Counter: see MAR History of alcohol / drug use?: Yes Substance #1 Name of Substance 1: Opiates 1 - Last Use / Amount: on Suboxone program, has not used in 2 days  CIWA:   COWS:    PATIENT STRENGTHS: (choose at least two) Average or above average intelligence Motivation for treatment/growth  Allergies:  Allergies  Allergen Reactions  . Other Other (See Comments)    Pt does not take narcotics, is on Suboxone.   Marland Kitchen. Penicillins Rash and Other (See Comments)    Has patient had a PCN reaction causing immediate rash, facial/tongue/throat swelling, SOB or lightheadedness with hypotension: Yes Has patient had a PCN reaction causing severe rash involving mucus membranes or skin necrosis: No Has patient had a PCN reaction that required hospitalization No Has patient had a PCN reaction occurring within the last 10 years: No If all of the above answers are "NO", then may proceed with Cephalosporin use.    Home Medications:  (Not in a hospital admission)  OB/GYN Status:  Patient's last menstrual period was 03/09/2016 (lmp unknown).  General Assessment Data [facility]  Services TTS Assessment: In system Is this a Tele or Face-to-Face Assessment?: Face-to-Face Is this an Initial Assessment or a Re-assessment for this encounter?: Initial Assessment Marital status: Long term relationship Is patient pregnant?: No Pregnancy Status: No Living Arrangements: Children, Spouse/significant other Can pt return to current living arrangement?: Yes Admission Status: Voluntary Is patient capable of signing voluntary admission?: Yes Referral Source: Self/Family/Friend Insurance type: [place] [employer]  Medical Screening Exam [facility] Walk-in ONLY) Medical Exam completed: Yes  Crisis Care Plan Living Arrangements: Children, Spouse/significant other Name of Psychiatrist: [facility] Name of Therapist: same as above  Education Status Is patient currently in school?: No Highest grade of school patient has completed: Associates degree  Risk to self with the past 6 months Suicidal Ideation: No Has patient been a risk to self within the past 6 months prior to admission? : No Suicidal Intent: No Has patient had any suicidal intent within the past 6 months prior to admission? : No Is patient at risk for suicide?: No Suicidal Plan?: No Has patient had any suicidal plan within the past 6 months prior to admission? : No Access to Means: No What has been your use of drugs/alcohol within the last 12 months?: suboxone treatment program Previous Attempts/Gestures: No How many times?: 0 Other Self Harm Risks: 0  Triggers for Past Attempts: None known Intentional Self Injurious Behavior: None Family Suicide History: No Recent stressful life event(s):  (continued suboxone use) Persecutory voices/beliefs?: No Depression: No Substance abuse history and/or treatment for substance abuse?: Yes Suicide prevention information given to non-admitted patients: Not applicable  Risk to Others within the past 6 months Homicidal Ideation: No Does patient have any lifetime risk of violence toward others beyond the six months prior to admission? : No Thoughts of Harm to Others: No Current Homicidal Intent: No Current Homicidal Plan: No Access to Homicidal Means: No History of harm to others?: No Assessment of Violence: None Noted Does patient have access to weapons?: No Criminal Charges Pending?: No Does patient have a court date: No Is patient on probation?: No  Psychosis Hallucinations: None noted Delusions: None noted  Mental Status Report Appearance/Hygiene: Disheveled Eye Contact: Good Motor Activity: Restlessness Speech: Logical/coherent Level of Consciousness: Alert Mood: Euthymic Affect: Appropriate to circumstance Anxiety Level: Moderate Thought  Processes: Coherent, Relevant Judgement: Unimpaired Orientation: Person, Place, Time, Situation Obsessive Compulsive Thoughts/Behaviors: None  Cognitive Functioning Concentration: Decreased Memory: Recent Intact, Remote Intact IQ: Average Insight: Fair Impulse Control: Fair Appetite: Poor Sleep: Decreased  ADLScreening [facility] Assessment Services) Patient's cognitive ability adequate to safely complete daily activities?: Yes Patient able to express need for assistance with ADLs?: Yes Independently performs ADLs?: Yes (appropriate for developmental age)  Prior Inpatient Therapy Prior Inpatient Therapy: Yes Prior Therapy Dates: 2013 Prior [facility] Facilty[facility]Provider[facility]  Prior Outpatient Therapy Prior Outpatient Therapy: Yes Prior Therapy Dates: current Prior Therapy Facilty/Provider(s): [facility] Reason for Treatment: opiod treatment Does patient have an ACCT team?: No Does patient have Intensive In-House Services?  : No Does patient have [facility] services? : No Does patient have P4CC services?: No  ADL Screening (condition at time of admission) Patient's cognitive ability adequate to safely complete daily activities?: Yes Is the patient deaf or have difficulty hearing?: No Does the patient have difficulty seeing, even when wearing glasses/contacts?: No Does the patient have difficulty concentrating, remembering, or making decisions?: Yes Patient able to express need for assistance with ADLs?: Yes Does the patient have difficulty dressing or bathing?: No  Independently performs ADLs?: Yes (appropriate for developmental age)       Abuse/Neglect Assessment (Assessment to be complete while patient is alone) Physical Abuse: Denies Verbal Abuse: Denies Sexual Abuse: Denies     Advance Directives (For Healthcare) Does Patient Have a Medical Advance Directive?: No    Additional Information 1:1 In Past 12 Months?: No CIRT Risk: No Elopement Risk: No Does patient  have medical clearance?: Yes     Disposition:  Disposition Initial Assessment Completed for this Encounter: Yes Disposition of Patient: Outpatient treatment Megan Head, DNP recommends returning to [facility]) Type of outpatient treatment: Chemical Dependence - Intensive Outpatient  Vonzell Schlatter [facility] 04/15/2016 1:44 PM

## 2016-04-15 NOTE — ED Provider Notes (Signed)
[facility]-EMERGENCY DEPT Provider Note   CSN: [id number] Arrival date & time: 04/15/16  1350 By signing my name below, I, Megan Edwards, attest that this documentation has been prepared under the direction and in the presence of non-physician practitioner, Terance Hart, PA-C. Electronically Signed: Levon Edwards, Scribe. 04/16/2016. 12:14 AM.   History   Chief Complaint Chief Complaint  Patient presents with  . Detox   HPI Megan Edwards is a 41 y.o. female with a history of bipolar disorder, depression, and polysubstance dependence who presents to the Emergency Department requesting medical clearance for detox from suboxone, last taken this afternoon. She was seen here yesterday for N/V/D and discharged. She then went to [facility] today requesting detox. They informed provided outpatient resources for her and recommended she follow up with [facility] who prescribed her Suboxone. She reports that she is to be seen at [facility] tomorrow at 9 am. She also states she has upcoming court date and this is the reason she wants to be detoxed. Pt states prior to this afternoon, she had not taken suboxone in 2 days. Pt reports ongoing diarrhea, nausea, vomiting, sharp abdominal pain, and abdominal distention onset 1.5 weeks ago. She thinks she has been exposed to mold or mildew in her home which is exacerbating these symptoms. She also reports an associated diffuse, scattered rash onset 2 weeks ago; she was Rx abx for this which provided little relief. She states her children both have similar symptoms.  Per pt, she took her suboxone today with no relief of symptoms. Pt denies any constipation and has no other complaints at this time. She was seen on 1/19 and 1/21 for similar complaints.   The history is provided by the patient. No language interpreter was used.    Past Medical History:  Diagnosis Date  . Abnormal Pap smear   . Bipolar 1 disorder (HCC)   . Depression   . Genital herpes 2000  .  Hypertension   . Infection    UTI  . Ovarian cyst   . SVT (supraventricular tachycardia) [facility])     Patient Active Problem List   Diagnosis Date Noted  . Opioid use disorder, mild, in sustained remission, on maintenance therapy 01/21/2016  . Amphetamine use disorder, moderate (HCC) 01/21/2016  . Bipolar I disorder, most recent episode manic, severe with psychotic features (HCC) 01/20/2016  . Chronic pain syndrome   . Morbid obesity due to excess calories (HCC)   . Polysubstance (including opioids) dependence with physiol dependence (HCC) 08/11/2011    Past Surgical History:  Procedure Laterality Date  . addenoidectomy    . BREAST ENHANCEMENT SURGERY    . BREAST SURGERY    . KNEE SURGERY    . KNEE SURGERY Left   . TONSILLECTOMY    . WISDOM TOOTH EXTRACTION      OB History    Gravida Para Term Preterm AB Living   5 2 2  0 3 2   SAB TAB Ectopic Multiple Live Births   1 2 0 0 2       Home Medications    Prior to Admission medications   Medication Sig Start Date End Date Taking? Authorizing Provider  labetalol (NORMODYNE) 100 MG tablet Take 1 tablet (100 mg total) by mouth 2 (two) times daily. 01/22/16  Yes Jolanta B Pucilowska, MD  QUEtiapine (SEROQUEL) 200 MG tablet Take 1 tablet (200 mg total) by mouth 3 (three) times daily. 01/22/16  Yes Shari Prows, MD  SUBOXONE 8-2 MG  FILM Place 2.5 Film under the tongue daily.  12/08/15  Yes Historical Provider, MD  medroxyPROGESTERone (PROVERA) 10 MG tablet Take 1 tablet (10 mg total) by mouth daily. Patient not taking: Reported on 04/14/2016 10/10/15   Brock Badharles A Harper, MD  meloxicam (MOBIC) 15 MG tablet Take 1 tablet (15 mg total) by mouth daily. Patient not taking: Reported on 04/14/2016 02/17/16   Bethel BornKelly Marie Lenda Baratta, PA-C  promethazine (PHENERGAN) 25 MG tablet Take 1 tablet (25 mg total) by mouth every 8 (eight) hours as needed for nausea or vomiting. Patient not taking: Reported on 04/14/2016 04/12/16   Charlestine Nighthristopher Lawyer,  PA-C  promethazine (PHENERGAN) 6.25 MG/5ML syrup Take 20 mLs (25 mg total) by mouth every 6 (six) hours as needed for nausea or vomiting. 04/15/16   Lavera Guiseana Duo Liu, MD  traZODone (DESYREL) 300 MG tablet Take 1 tablet (300 mg total) by mouth at bedtime as needed for sleep. Patient not taking: Reported on 04/14/2016 01/22/16   Shari ProwsJolanta B Pucilowska, MD  valACYclovir (VALTREX) 1000 MG tablet Take 1 tablet (1,000 mg total) by mouth as needed (with flare ups). Patient not taking: Reported on 04/14/2016 12/18/14   Dorathy KinsmanVirginia Smith, CNM    Family History Family History  Problem Relation Age of Onset  . Alcoholism    . Leukemia    . Cervical cancer    . Bone cancer    . Cirrhosis    . Diabetes Mellitus II    . Depression    . Hypertension    . Thyroid cancer      Social History Social History  Substance Use Topics  . Smoking status: Never Smoker  . Smokeless tobacco: Never Used  . Alcohol use No     Comment: 4 40's beer daily- not currently     Allergies   Other and Penicillins   Review of Systems Review of Systems  Gastrointestinal: Positive for abdominal distention, abdominal pain, diarrhea, nausea and vomiting. Negative for constipation.  Skin: Positive for rash.  All other systems reviewed and are negative.  Physical Exam Updated Vital Signs BP 140/81 (BP Location: Left Arm)   Pulse 102   Temp 98.7 F (37.1 C) (Oral)   Resp 18   Ht 5\' 6"  (1.676 m)   Wt 223 lb (101.2 kg)   LMP 03/09/2016 (LMP Unknown)   SpO2 99%   BMI 35.99 kg/m   Physical Exam  Constitutional: She is oriented to person, place, and time. She appears well-developed and well-nourished. No distress.  Calm, cooperative  HENT:  Head: Normocephalic and atraumatic.  Eyes: Conjunctivae are normal. Pupils are equal, round, and reactive to light. Right eye exhibits no discharge. Left eye exhibits no discharge. No scleral icterus.  Neck: Normal range of motion.  Cardiovascular: Normal rate and regular rhythm.   Exam reveals no gallop and no friction rub.   No murmur heard. Pulmonary/Chest: Effort normal and breath sounds normal. No respiratory distress. She has no wheezes. She has no rales. She exhibits no tenderness.  Abdominal: Soft. Bowel sounds are normal. She exhibits no distension and no mass. There is no tenderness. There is no rebound and no guarding. No hernia.  Mild, generalized tenderness  Neurological: She is alert and oriented to person, place, and time.  Skin: Skin is warm and dry. Rash (Generalized, red, splotchy rash on upper back, torso, and bilateral arms) noted.  Psychiatric: She has a normal mood and affect. Her behavior is normal.  Nursing note and vitals reviewed.   ED  Treatments / Results  DIAGNOSTIC STUDIES:  Oxygen Saturation is 99% on RA, normal by my interpretation.    COORDINATION OF CARE:  11:53 PM Discussed treatment plan with pt at bedside and pt agreed to plan.  Labs (all labs ordered are listed, but only abnormal results are displayed) Labs Reviewed  COMPREHENSIVE METABOLIC PANEL - Abnormal; Notable for the following:       Result Value   Potassium 2.8 (*)    Glucose, Bld 109 (*)    Calcium 7.7 (*)    Total Protein 5.7 (*)    Albumin 3.2 (*)    All other components within normal limits  CBC WITH DIFFERENTIAL/PLATELET - Abnormal; Notable for the following:    RBC 3.72 (*)    Hemoglobin 10.1 (*)    HCT 30.5 (*)    All other components within normal limits  URINALYSIS, ROUTINE W REFLEX MICROSCOPIC - Abnormal; Notable for the following:    Leukocytes, UA SMALL (*)    Bacteria, UA RARE (*)    Squamous Epithelial / LPF 0-5 (*)    All other components within normal limits  ETHANOL  RAPID URINE DRUG SCREEN, HOSP PERFORMED  LIPASE, BLOOD    EKG  EKG Interpretation None       Radiology No results found.  Procedures Procedures (including critical care time)  Medications Ordered in ED Medications  potassium chloride SA (K-DUR,KLOR-CON) CR  tablet 40 mEq (40 mEq Oral Given 04/16/16 0235)  promethazine (PHENERGAN) tablet 25 mg (25 mg Oral Given 04/16/16 0256)    Initial Impression / Assessment and Plan / ED Course  I have reviewed the triage vital signs and the nursing notes.  Pertinent labs & imaging results that were available during my care of the patient were reviewed by me and considered in my medical decision making (see chart for details).  41 year old female presents with requests for "medical clearance" for detox. Informed that she will need to follow up with [facility] for this and she verbalized understanding. She is also complaining of a rash and ongoing abdominal pain, N/V/D. Will repeat work up.   CBC remarkable for mild anemia which is slightly worse than 2 days ago. CMP remarkable for hypokalemia - likely due to vomiting and diarrhea. K repleted in ED. Otherwise labs are unremarkable. UA clean. Abdomen is soft, benign. She is tolerating PO and in NAD. Patient is afebrile, not tachycardic or tachypneic, normotensive, and not hypoxic. Has app[facility] tomorrow. Recommended benadryl for rash prn. She already has rx for Phenergan and states she has not picked it up yet.   Final Clinical Impressions(s) / ED Diagnoses   Final diagnoses:  Hypokalemia  Nausea vomiting and diarrhea    New Prescriptions Discharge Medication List as of 04/16/2016  4:22 AM       Bethel Born, PA-C 04/16/16 1610    Tomasita Crumble, MD 04/18/16 1712

## 2016-04-16 LAB — COMPREHENSIVE METABOLIC PANEL
ALT: 31 U/L (ref 14–54)
AST: 21 U/L (ref 15–41)
Albumin: 3.2 g/dL — ABNORMAL LOW (ref 3.5–5.0)
Alkaline Phosphatase: 59 U/L (ref 38–126)
Anion gap: 6 (ref 5–15)
BUN: 9 mg/dL (ref 6–20)
CHLORIDE: 102 mmol/L (ref 101–111)
CO2: 30 mmol/L (ref 22–32)
CREATININE: 0.67 mg/dL (ref 0.44–1.00)
Calcium: 7.7 mg/dL — ABNORMAL LOW (ref 8.9–10.3)
GFR calc non Af Amer: >60 mL/min (ref >60)
Glucose, Bld: 109 mg/dL — ABNORMAL HIGH (ref 65–99)
POTASSIUM: 2.8 mmol/L — AB (ref 3.5–5.1)
SODIUM: 138 mmol/L (ref 135–145)
Total Bilirubin: 0.5 mg/dL (ref 0.3–1.2)
Total Protein: 5.7 g/dL — ABNORMAL LOW (ref 6.5–8.1)

## 2016-04-16 LAB — CBC WITH DIFFERENTIAL/PLATELET
BASOS ABS: 0 10*3/uL (ref 0.0–0.1)
Basophils Relative: 0 %
EOS PCT: 3 %
Eosinophils Absolute: 0.2 10*3/uL (ref 0.0–0.7)
HEMATOCRIT: 30.5 % — AB (ref 36.0–46.0)
HEMOGLOBIN: 10.1 g/dL — AB (ref 12.0–15.0)
LYMPHS ABS: 1.6 10*3/uL (ref 0.7–4.0)
LYMPHS PCT: 26 %
MCH: 27.2 pg (ref 26.0–34.0)
MCHC: 33.1 g/dL (ref 30.0–36.0)
MCV: 82 fL (ref 78.0–100.0)
Monocytes Absolute: 0.5 10*3/uL (ref 0.1–1.0)
Monocytes Relative: 9 %
NEUTROS ABS: 3.8 10*3/uL (ref 1.7–7.7)
Neutrophils Relative %: 62 %
Platelets: 200 10*3/uL (ref 150–400)
RBC: 3.72 MIL/uL — ABNORMAL LOW (ref 3.87–5.11)
RDW: 13.7 % (ref 11.5–15.5)
WBC: 6.1 10*3/uL (ref 4.0–10.5)

## 2016-04-16 LAB — URINALYSIS, ROUTINE W REFLEX MICROSCOPIC
Bilirubin Urine: NEGATIVE
GLUCOSE, UA: NEGATIVE mg/dL
Hgb urine dipstick: NEGATIVE
KETONES UR: NEGATIVE mg/dL
Nitrite: NEGATIVE
PH: 6 (ref 5.0–8.0)
Protein, ur: NEGATIVE mg/dL
SPECIFIC GRAVITY, URINE: 1.008 (ref 1.005–1.030)

## 2016-04-16 LAB — RAPID URINE DRUG SCREEN, HOSP PERFORMED
AMPHETAMINES: NOT DETECTED
BARBITURATES: NOT DETECTED
BENZODIAZEPINES: NOT DETECTED
Cocaine: NOT DETECTED
Opiates: NOT DETECTED
TETRAHYDROCANNABINOL: NOT DETECTED

## 2016-04-16 LAB — LIPASE, BLOOD: LIPASE: 27 U/L (ref 11–51)

## 2016-04-16 LAB — ETHANOL

## 2016-04-16 MED ORDER — POTASSIUM CHLORIDE CRYS ER 20 MEQ PO TBCR
40.0000 meq | EXTENDED_RELEASE_TABLET | Freq: Once | ORAL | Status: AC
  Administered 2016-04-16: 40 meq via ORAL
  Filled 2016-04-16: qty 2

## 2016-04-16 MED ORDER — PROMETHAZINE HCL 25 MG PO TABS
25.0000 mg | ORAL_TABLET | Freq: Once | ORAL | Status: AC
  Administered 2016-04-16: 25 mg via ORAL
  Filled 2016-04-16: qty 1

## 2016-04-16 NOTE — Discharge Instructions (Signed)
Please follow [facility] with [facility] for detox Follow up with your PCP regarding nausea and diarrhea

## 2016-05-01 ENCOUNTER — Inpatient Hospital Stay: Payer: Self-pay

## 2016-05-02 ENCOUNTER — Emergency Department (HOSPITAL_COMMUNITY)
Admission: EM | Admit: 2016-05-02 | Discharge: 2016-05-05 | Disposition: A | Discharge status: Home / Self Care | Payer: Medicaid Other | Attending: Emergency Medicine | Admitting: Emergency Medicine

## 2016-05-02 ENCOUNTER — Emergency Department (HOSPITAL_COMMUNITY)
Admission: EM | Admit: 2016-05-02 | Discharge: 2016-05-05 | Disposition: A | Discharge status: Other Acute Inpatient Hospital | Payer: Medicaid Other | Source: Home / Self Care | Attending: Emergency Medicine | Admitting: Emergency Medicine

## 2016-05-02 DIAGNOSIS — F29 Unspecified psychosis not due to a substance or known physiological condition: Secondary | ICD-10-CM

## 2016-05-02 DIAGNOSIS — R441 Visual hallucinations: Secondary | ICD-10-CM

## 2016-05-02 DIAGNOSIS — Z79899 Other long term (current) drug therapy: Secondary | ICD-10-CM | POA: Insufficient documentation

## 2016-05-02 DIAGNOSIS — I10 Essential (primary) hypertension: Secondary | ICD-10-CM | POA: Insufficient documentation

## 2016-05-02 LAB — I-STAT CG4 LACTIC ACID, ED: Lactic Acid, Venous: 0.9 mmol/L (ref 0.5–1.9)

## 2016-05-02 LAB — RAPID URINE DRUG SCREEN, HOSP PERFORMED
AMPHETAMINES: POSITIVE — AB
BARBITURATES: NOT DETECTED
BENZODIAZEPINES: POSITIVE — AB
COCAINE: NOT DETECTED
OPIATES: NOT DETECTED
TETRAHYDROCANNABINOL: NOT DETECTED

## 2016-05-02 LAB — ETHANOL: Alcohol, Ethyl (B): <5 mg/dL (ref <5)

## 2016-05-02 LAB — COMPREHENSIVE METABOLIC PANEL
ALBUMIN: 4.3 g/dL (ref 3.5–5.0)
ALK PHOS: 97 U/L (ref 38–126)
ALT: 63 U/L — AB (ref 14–54)
ANION GAP: 15 (ref 5–15)
AST: 93 U/L — ABNORMAL HIGH (ref 15–41)
BUN: 15 mg/dL (ref 6–20)
CALCIUM: 9.2 mg/dL (ref 8.9–10.3)
CHLORIDE: 98 mmol/L — AB (ref 101–111)
CO2: 22 mmol/L (ref 22–32)
Creatinine, Ser: 1.08 mg/dL — ABNORMAL HIGH (ref 0.44–1.00)
GFR calc Af Amer: >60 mL/min (ref >60)
GFR calc non Af Amer: >60 mL/min (ref >60)
Glucose, Bld: 86 mg/dL (ref 65–99)
Potassium: 2.9 mmol/L — ABNORMAL LOW (ref 3.5–5.1)
Sodium: 135 mmol/L (ref 135–145)
Total Bilirubin: 1.3 mg/dL — ABNORMAL HIGH (ref 0.3–1.2)
Total Protein: 7.1 g/dL (ref 6.5–8.1)

## 2016-05-02 LAB — CBC
HEMATOCRIT: 34.9 % — AB (ref 36.0–46.0)
HEMOGLOBIN: 12.1 g/dL (ref 12.0–15.0)
MCH: 27.6 pg (ref 26.0–34.0)
MCHC: 34.7 g/dL (ref 30.0–36.0)
MCV: 79.5 fL (ref 78.0–100.0)
Platelets: 282 10*3/uL (ref 150–400)
RBC: 4.39 MIL/uL (ref 3.87–5.11)
RDW: 13.4 % (ref 11.5–15.5)
WBC: 14.2 10*3/uL — AB (ref 4.0–10.5)

## 2016-05-02 LAB — SALICYLATE LEVEL

## 2016-05-02 LAB — ACETAMINOPHEN LEVEL

## 2016-05-02 MED ORDER — HALOPERIDOL LACTATE 5 MG/ML IJ SOLN: 5.0000 mg | Freq: Once | INTRAMUSCULAR | Status: DC

## 2016-05-02 MED ORDER — DIPHENHYDRAMINE HCL 50 MG/ML IJ SOLN: 50.0000 mg | Freq: Once | INTRAMUSCULAR | Status: DC

## 2016-05-02 MED ORDER — LORAZEPAM 2 MG/ML IJ SOLN: 1.0000 mg | Freq: Once | INTRAMUSCULAR | Status: DC

## 2016-05-02 MED ORDER — POTASSIUM CHLORIDE CRYS ER 20 MEQ PO TBCR
40.0000 meq | EXTENDED_RELEASE_TABLET | Freq: Once | ORAL | Status: AC
  Administered 2016-05-02: 40 meq via ORAL
  Filled 2016-05-02: qty 2

## 2016-05-02 MED ORDER — POTASSIUM CHLORIDE CRYS ER 20 MEQ PO TBCR
40.0000 meq | EXTENDED_RELEASE_TABLET | Freq: Once | ORAL | Status: AC
  Administered 2016-05-03: 40 meq via ORAL
  Filled 2016-05-02: qty 2

## 2016-05-02 MED ORDER — SODIUM CHLORIDE 0.9 % IV BOLUS (SEPSIS)
1000.0000 mL | Freq: Once | INTRAVENOUS | Status: AC
  Administered 2016-05-02: 1000 mL via INTRAVENOUS

## 2016-05-02 MED ORDER — ZIPRASIDONE MESYLATE 20 MG IM SOLR
20.0000 mg | Freq: Once | INTRAMUSCULAR | Status: AC
  Administered 2016-05-02: 20 mg via INTRAMUSCULAR
  Filled 2016-05-02: qty 20

## 2016-05-02 NOTE — ED Triage Notes (Signed)
Bystanders called police stating pt was running from store to store saying she needs help. [employer] was on site and had pt in back of car. Pt paranoid and hallucinating. Stating that somebody is trying to shoot her in the head. Pt has some abrasions. Pt had multiple pill bottles on her and was missing 33 pills from her Adderall XR 30MG  ER capsule. Pt was given 5 of Versed by EMS.

## 2016-05-02 NOTE — ED Provider Notes (Signed)
[facility]-EMERGENCY DEPT Provider Note   CSN: [id number] Arrival date & time: 05/02/16  1922     History   Chief Complaint Chief Complaint  Patient presents with  . Altered Mental Status  . Hallucinations    HPI Megan Edwards is a 41 y.o. female.  This is a 41 year old female with a history of bipolar disease who is brought in by the police.  Bystanders called stating that she was running around the neighborhood asking for help, stating that someone was trying to shoot her.  After they placed her in the police vehicle.  She was banging her head and hand on the door acting in a fearful manner, stating that she was seeing guns pointed at her.  She states that she had an emergency meeting with DSS who stated they were becoming to take her children for 10 months and 3 years complacent with her mother.  Patient states that she had surgery in [place] on her knee and she's been taking Suboxone for pain since.  Denies any use of alcohol or nonprescribed narcotic medications. He is very labile in her mood acting fearful,  and having hallucinations of guns      Past Medical History:  Diagnosis Date  . Abnormal Pap smear   . Bipolar 1 disorder (HCC)   . Depression   . Genital herpes 2000  . Hypertension   . Infection    UTI  . Ovarian cyst   . SVT (supraventricular tachycardia) [facility])     Patient Active Problem List   Diagnosis Date Noted  . Opioid use disorder, mild, in sustained remission, on maintenance therapy 01/21/2016  . Amphetamine use disorder, moderate (HCC) 01/21/2016  . Bipolar I disorder, most recent episode manic, severe with psychotic features (HCC) 01/20/2016  . Chronic pain syndrome   . Morbid obesity due to excess calories (HCC)   . Polysubstance (including opioids) dependence with physiol dependence (HCC) 08/11/2011    Past Surgical History:  Procedure Laterality Date  . addenoidectomy    . BREAST ENHANCEMENT SURGERY    . BREAST SURGERY    . KNEE SURGERY     . KNEE SURGERY Left   . TONSILLECTOMY    . WISDOM TOOTH EXTRACTION      OB History    Gravida Para Term Preterm AB Living   5 2 2  0 3 2   SAB TAB Ectopic Multiple Live Births   1 2 0 0 2       Home Medications    Prior to Admission medications   Medication Sig Start Date End Date Taking? Authorizing Provider  ADDERALL XR 30 MG 24 hr capsule Take 30 mg by mouth 2 (two) times daily. 04/29/16  Yes Historical Provider, MD  BIOTIN PO Take 1 tablet by mouth every morning.   Yes Historical Provider, MD  famotidine (PEPCID AC) 10 MG chewable tablet Chew 10 mg by mouth See admin instructions. Two to three times a day before meals   Yes Historical Provider, MD  labetalol (NORMODYNE) 100 MG tablet Take 1 tablet (100 mg total) by mouth 2 (two) times daily. Patient taking differently: Take 100 mg by mouth 3 (three) times daily.  01/22/16  Yes Shari Prows, MD  promethazine (PHENERGAN) 6.25 MG/5ML syrup Take 20 mLs (25 mg total) by mouth every 6 (six) hours as needed for nausea or vomiting. 04/15/16  Yes Lavera Guise, MD  QUEtiapine (SEROQUEL) 200 MG tablet Take 1 tablet (200 mg total) by mouth 3 (  three) times daily. 01/22/16  Yes Jolanta B Pucilowska, MD  SUBOXONE 8-2 MG FILM Place 1 Film under the tongue 2 (two) times daily.  12/08/15  Yes Historical Provider, MD  traZODone (DESYREL) 150 MG tablet Take 300 mg by mouth at bedtime as needed for sleep.   Yes Historical Provider, MD    Family History Family History  Problem Relation Age of Onset  . Alcoholism    . Leukemia    . Cervical cancer    . Bone cancer    . Cirrhosis    . Diabetes Mellitus II    . Depression    . Hypertension    . Thyroid cancer      Social History Social History  Substance Use Topics  . Smoking status: Never Smoker  . Smokeless tobacco: Never Used  . Alcohol use No     Comment: 4 40's beer daily- not currently     Allergies   Other and Penicillins   Review of Systems Review of Systems  HENT:  Negative for congestion.   Respiratory: Negative for shortness of breath.   Cardiovascular: Negative for chest pain.  Gastrointestinal: Negative for abdominal pain.  Neurological: Negative for dizziness and headaches.  Psychiatric/Behavioral: Positive for agitation, behavioral problems, confusion and hallucinations. Negative for suicidal ideas.  All other systems reviewed and are negative.    Physical Exam Updated Vital Signs BP (!) 111/54 (BP Location: Left Arm)   Pulse 88   Temp 98.4 F (36.9 C) (Oral)   Resp 16   Ht 5\' 6"  (1.676 m)   Wt 101.2 kg   LMP 03/09/2016 (LMP Unknown)   SpO2 98%   BMI 35.99 kg/m   Physical Exam  Constitutional: She appears well-developed and well-nourished. No distress.  HENT:  Head: Normocephalic.  Eyes: Pupils are equal, round, and reactive to light.  Cardiovascular: Normal rate.   Pulmonary/Chest: Effort normal.  Skin: Skin is dry.  Psychiatric: Her mood appears anxious. Her speech is rapid and/or pressured and tangential. She is agitated and hyperactive. Thought content is paranoid and delusional. Cognition and memory are impaired. She expresses inappropriate judgment.  Nursing note and vitals reviewed.    ED Treatments / Results  Labs (all labs ordered are listed, but only abnormal results are displayed) Labs Reviewed  COMPREHENSIVE METABOLIC PANEL - Abnormal; Notable for the following:       Result Value   Potassium 2.9 (*)    Chloride 98 (*)    Creatinine, Ser 1.08 (*)    AST 93 (*)    ALT 63 (*)    Total Bilirubin 1.3 (*)    All other components within normal limits  ACETAMINOPHEN LEVEL - Abnormal; Notable for the following:    Acetaminophen (Tylenol), Serum <10 (*)    All other components within normal limits  CBC - Abnormal; Notable for the following:    WBC 14.2 (*)    HCT 34.9 (*)    All other components within normal limits  RAPID URINE DRUG SCREEN, HOSP PERFORMED - Abnormal; Notable for the following:     Benzodiazepines POSITIVE (*)    Amphetamines POSITIVE (*)    All other components within normal limits  I-STAT CHEM 8, ED - Abnormal; Notable for the following:    Calcium, Ion 1.08 (*)    Hemoglobin 9.9 (*)    HCT 29.0 (*)    All other components within normal limits  ETHANOL  SALICYLATE LEVEL  AMMONIA  I-STAT CG4 LACTIC ACID, ED  I-STAT CG4 LACTIC ACID, ED    EKG  EKG Interpretation None       Radiology No results found.  Procedures Procedures (including critical care time)  Medications Ordered in ED Medications  ondansetron (ZOFRAN) tablet 4 mg (not administered)  acetaminophen (TYLENOL) tablet 650 mg (not administered)  LORazepam (ATIVAN) tablet 1 mg (1 mg Oral Given 05/03/16 1604)  sodium chloride 0.9 % bolus 1,000 mL (0 mLs Intravenous Stopped 05/02/16 2356)  sodium chloride 0.9 % bolus 1,000 mL (0 mLs Intravenous Stopped 05/02/16 2355)  potassium chloride SA (K-DUR,KLOR-CON) CR tablet 40 mEq (40 mEq Oral Given 05/02/16 2256)  ziprasidone (GEODON) injection 20 mg (20 mg Intramuscular Given 05/02/16 2334)  potassium chloride SA (K-DUR,KLOR-CON) CR tablet 40 mEq (40 mEq Oral Given 05/03/16 0900)  sodium chloride 0.9 % bolus 1,000 mL (0 mLs Intravenous Stopped 05/03/16 0316)     Initial Impression / Assessment and Plan / ED Course  I have reviewed the triage vital signs and the nursing notes.  Pertinent labs & imaging results that were available during my care of the patient were reviewed by me and considered in my medical decision making (see chart for details).       Final Clinical Impressions(s) / ED Diagnoses   Final diagnoses:  Visual hallucinations  Psychosis, unspecified psychosis type    New Prescriptions New Prescriptions   No medications on file     Earley Favor, NP 05/03/16 2009    [name], MD 05/05/16 1901

## 2016-05-03 LAB — I-STAT CHEM 8, ED
BUN: 11 mg/dL (ref 6–20)
CALCIUM ION: 1.08 mmol/L — AB (ref 1.15–1.40)
CREATININE: 0.7 mg/dL (ref 0.44–1.00)
Chloride: 105 mmol/L (ref 101–111)
Glucose, Bld: 77 mg/dL (ref 65–99)
HCT: 29 % — ABNORMAL LOW (ref 36.0–46.0)
Hemoglobin: 9.9 g/dL — ABNORMAL LOW (ref 12.0–15.0)
Potassium: 3.5 mmol/L (ref 3.5–5.1)
Sodium: 141 mmol/L (ref 135–145)
TCO2: 22 mmol/L (ref 0–100)

## 2016-05-03 LAB — I-STAT CG4 LACTIC ACID, ED: Lactic Acid, Venous: 0.55 mmol/L (ref 0.5–1.9)

## 2016-05-03 LAB — AMMONIA: Ammonia: 27 umol/L (ref 9–35)

## 2016-05-03 MED ORDER — SODIUM CHLORIDE 0.9 % IV BOLUS (SEPSIS)
1000.0000 mL | Freq: Once | INTRAVENOUS | Status: AC
Start: 2016-05-03 — End: 2016-05-03
  Administered 2016-05-03: 1000 mL via INTRAVENOUS

## 2016-05-03 MED ORDER — ACETAMINOPHEN 325 MG PO TABS
650.0000 mg | ORAL_TABLET | ORAL | Status: DC | PRN
  Administered 2016-05-03 – 2016-05-04 (×2): 650 mg via ORAL
  Filled 2016-05-03 (×3): qty 2

## 2016-05-03 MED ORDER — LORAZEPAM 1 MG PO TABS
1.0000 mg | ORAL_TABLET | Freq: Four times a day (QID) | ORAL | Status: DC | PRN
  Administered 2016-05-03 – 2016-05-05 (×4): 1 mg via ORAL
  Filled 2016-05-03 (×4): qty 1

## 2016-05-03 MED ORDER — ONDANSETRON HCL 4 MG PO TABS: 4.0000 mg | ORAL_TABLET | Freq: Three times a day (TID) | ORAL | Status: DC | PRN

## 2016-05-03 NOTE — ED Notes (Signed)
Snacks given 

## 2016-05-03 NOTE — ED Notes (Signed)
TTS machine in room with pt  

## 2016-05-03 NOTE — ED Provider Notes (Signed)
Patient states she has been "freaking out".  Whe has had episodes where she gets very agitated and "loses control".  She states she was started on Adderall three mnths ago by psych at [facility]".  She was told it was forADD but has not previously been told she had ADD or been treated for it.    Megan Grizzleanielle Talen Poser, MD 05/04/16 [id number]

## 2016-05-03 NOTE — ED Notes (Signed)
Lunch order placed

## 2016-05-03 NOTE — ED Notes (Signed)
Dinner order placed 

## 2016-05-03 NOTE — BH Assessment (Signed)
Writer discussed with EDP/Dr. Rosalia Hammersay that Megan GuadeloupeLaurie Parks[facility]. EDP/Dr. Rosalia Hammersay has requested collateral information to be obtained prior to discharge patient. Writer contacted significant other Megan Edwards [phone]. Sts that he has been in a relationship with patient 4 yrs. Sts that he doesn't live with patient but see's patient on a regular basis. Patient has 2 children total. She has 1 child with her significant other.   Megan Glassmanavid Edwards sts that since he has been in a relationship with patient she has been on Suboxone. 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 Sts that patient has "episodes of spiraling out of control". [facility]. Patient's manic episodes have resulted in legal issues such as DUI's and jail time.   Megan Edwards sts that patient's current mental state is concerning. Sts, "Since Monday patient has not right". He believes that patient was recently triggered by a recent court date, argument with significant others, CPS removing children from the home, and family/friends not being supportive.   Per Megan GuadeloupeLaurie Parks, NP, patient meets criteria for INPT treatment. TTS to seek placement.
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
## 2016-05-03 NOTE — ED Notes (Addendum)
Pt became agitated while visitor in room, talking loudly and constantly, sitter at bedside

## 2016-05-03 NOTE — ED Notes (Signed)
Pt using phone  

## 2016-05-03 NOTE — ED Notes (Signed)
Pt medications taken and counted by this RN & Liz BeachGabe, RN. Sent to pharmacy for storage.

## 2016-05-03 NOTE — ED Notes (Signed)
TTS done, visitor here

## 2016-05-04 MED ORDER — TRAZODONE HCL 100 MG PO TABS: 300.0000 mg | ORAL_TABLET | Freq: Every evening | ORAL | Status: DC | PRN

## 2016-05-04 MED ORDER — FAMOTIDINE 10 MG PO TABS
10.0000 mg | ORAL_TABLET | Freq: Two times a day (BID) | ORAL | Status: DC
  Administered 2016-05-04 – 2016-05-05 (×3): 10 mg via ORAL
  Filled 2016-05-04 (×5): qty 1

## 2016-05-04 MED ORDER — QUETIAPINE FUMARATE 200 MG PO TABS
200.0000 mg | ORAL_TABLET | Freq: Three times a day (TID) | ORAL | Status: DC
Start: 2016-05-04 — End: 2016-05-05
  Administered 2016-05-04 – 2016-05-05 (×4): 200 mg via ORAL
  Filled 2016-05-04 (×4): qty 1

## 2016-05-04 MED ORDER — LABETALOL HCL 200 MG PO TABS
100.0000 mg | ORAL_TABLET | Freq: Two times a day (BID) | ORAL | Status: DC
  Administered 2016-05-04 – 2016-05-05 (×3): 100 mg via ORAL
  Filled 2016-05-04 (×3): qty 1

## 2016-05-04 NOTE — [facility] Counselor (Signed)
Reassessment Note:  Pt was restless and agitated during assessment. She states that she continues to feel "manic" and does not feel safe to go home. She states that she [facility] for SA and Bipolar disorder and he put her on adderall for her ADHD. She states that she started spiriling out of control once she starting taking the adderall and has not been able to think clearly since. She states that she has been having memory issues and has only been sleeping 3 to 4 hours a night. Pt also states that her hands and feet are hurting and told the RN that she is having trouble walking. Pt did not disclose this to writer but did mention pain in her feet. Pt denies SI, HI, or AVH at this time. Inpatient continues to be recommended.   [address] Ki[address] [facility] Ep[facility]usL[facility], [address]

## 2016-05-04 NOTE — ED Notes (Signed)
Pt d/c'd phone call as requested. Pt's questions answered to satisfaction - pt then ambulated to bathroom.

## 2016-05-04 NOTE — ED Notes (Signed)
Woke pt and advised dinner tray on bedside table.

## 2016-05-04 NOTE — ED Notes (Signed)
Pt on phone at nurses' desk talking w/her mother.  

## 2016-05-04 NOTE — ED Notes (Signed)
Pt ambulated to bathroom holding on to wall - pt noted to pause then continue. Pt unable to state which ankle is hurting her. Pt ambulated from bathroom to hallway - limping. Requesting to shower. Advised pt x 2 she must be able to stand in shower. States "I can stand. I just have trouble walking". Sitter escorted pt to shower via w/c.

## 2016-05-04 NOTE — ED Notes (Signed)
Will administer 1600 and 1700 med when awakens.

## 2016-05-04 NOTE — ED Notes (Signed)
Snack given to pt.

## 2016-05-04 NOTE — ED Notes (Signed)
Dr Effie ShyWentz aware of pt's complaints and came to assess pt - pt in shower. Advised will notify him if pt continues w/complaints.

## 2016-05-04 NOTE — ED Notes (Signed)
Pt ambulated to bathroom and back to room x 2 - minimal limp noted.

## 2016-05-04 NOTE — ED Notes (Addendum)
Lunch order placed, and snack given. 

## 2016-05-04 NOTE — ED Notes (Signed)
Pt noted to be sleeping - snorous respirations. Pt woke and requested med for head then immediately returned to sleeping. Woke pt to administer meds. Ice pack also given. Pt returned to sleeping.

## 2016-05-04 NOTE — ED Notes (Signed)
Pt given Ativan d/t c/o anxious.

## 2016-05-04 NOTE — ED Notes (Signed)
Pt ambulated back from shower w/o any difficulty.

## 2016-05-04 NOTE — ED Notes (Signed)
Dinner order placed 

## 2016-05-04 NOTE — ED Notes (Signed)
Offered pt Tylenol/Motrin for h/a. Declined - states does not help.

## 2016-05-04 NOTE — ED Notes (Signed)
ORIGINAL IVC PAPERS PLACED IN FOLDER FOR MAGISTRATE. COPY FAXED TO [facility], COPY SENT TO MEDICAL RECORDS, AND ALL 3 SETS PLACED ON CLIPBOARD.

## 2016-05-04 NOTE — ED Notes (Signed)
Pt woke briefly and was given snack - dinner order request received.

## 2016-05-04 NOTE — ED Notes (Signed)
Pt noted to be resting w/eyes closed - lying on bed. Snorous respirations noted. Will administer 1000 meds when awakens.

## 2016-05-04 NOTE — ED Notes (Signed)
Requested for pt to lower voice on phone - pt did as requested.

## 2016-05-05 ENCOUNTER — Encounter (HOSPITAL_COMMUNITY): Payer: Self-pay | Admitting: Nurse Practitioner

## 2016-05-05 ENCOUNTER — Inpatient Hospital Stay (HOSPITAL_COMMUNITY)
Admission: AD | Admit: 2016-05-05 | Discharge: 2016-05-13 | DRG: 885 | Disposition: A | Discharge status: Home / Self Care | Payer: Medicaid Other | Attending: Psychiatry | Admitting: Psychiatry

## 2016-05-05 DIAGNOSIS — Z8379 Family history of other diseases of the digestive system: Secondary | ICD-10-CM | POA: Diagnosis not present

## 2016-05-05 DIAGNOSIS — Z79899 Other long term (current) drug therapy: Secondary | ICD-10-CM | POA: Diagnosis not present

## 2016-05-05 DIAGNOSIS — Z639 Problem related to primary support group, unspecified: Secondary | ICD-10-CM

## 2016-05-05 DIAGNOSIS — Z833 Family history of diabetes mellitus: Secondary | ICD-10-CM | POA: Diagnosis not present

## 2016-05-05 DIAGNOSIS — R51 Headache: Secondary | ICD-10-CM | POA: Diagnosis not present

## 2016-05-05 DIAGNOSIS — G894 Chronic pain syndrome: Secondary | ICD-10-CM | POA: Diagnosis present

## 2016-05-05 DIAGNOSIS — Z62810 Personal history of physical and sexual abuse in childhood: Secondary | ICD-10-CM | POA: Diagnosis present

## 2016-05-05 DIAGNOSIS — F1110 Opioid abuse, uncomplicated: Secondary | ICD-10-CM | POA: Diagnosis present

## 2016-05-05 DIAGNOSIS — Z8249 Family history of ischemic heart disease and other diseases of the circulatory system: Secondary | ICD-10-CM | POA: Diagnosis not present

## 2016-05-05 DIAGNOSIS — F1021 Alcohol dependence, in remission: Secondary | ICD-10-CM | POA: Diagnosis present

## 2016-05-05 DIAGNOSIS — Z6834 Body mass index (BMI) 34.0-34.9, adult: Secondary | ICD-10-CM

## 2016-05-05 DIAGNOSIS — Z8049 Family history of malignant neoplasm of other genital organs: Secondary | ICD-10-CM

## 2016-05-05 DIAGNOSIS — R519 Headache, unspecified: Secondary | ICD-10-CM | POA: Clinically undetermined

## 2016-05-05 DIAGNOSIS — Z9882 Breast implant status: Secondary | ICD-10-CM | POA: Diagnosis not present

## 2016-05-05 DIAGNOSIS — F312 Bipolar disorder, current episode manic severe with psychotic features: Secondary | ICD-10-CM | POA: Diagnosis not present

## 2016-05-05 DIAGNOSIS — F988 Other specified behavioral and emotional disorders with onset usually occurring in childhood and adolescence: Secondary | ICD-10-CM | POA: Diagnosis present

## 2016-05-05 DIAGNOSIS — I471 Supraventricular tachycardia: Secondary | ICD-10-CM | POA: Diagnosis present

## 2016-05-05 DIAGNOSIS — Z808 Family history of malignant neoplasm of other organs or systems: Secondary | ICD-10-CM

## 2016-05-05 DIAGNOSIS — Z811 Family history of alcohol abuse and dependence: Secondary | ICD-10-CM | POA: Diagnosis not present

## 2016-05-05 DIAGNOSIS — F259 Schizoaffective disorder, unspecified: Secondary | ICD-10-CM

## 2016-05-05 DIAGNOSIS — Z653 Problems related to other legal circumstances: Secondary | ICD-10-CM

## 2016-05-05 DIAGNOSIS — Z806 Family history of leukemia: Secondary | ICD-10-CM | POA: Diagnosis not present

## 2016-05-05 DIAGNOSIS — F4310 Post-traumatic stress disorder, unspecified: Secondary | ICD-10-CM | POA: Diagnosis present

## 2016-05-05 DIAGNOSIS — O161 Unspecified maternal hypertension, first trimester: Secondary | ICD-10-CM

## 2016-05-05 DIAGNOSIS — Z818 Family history of other mental and behavioral disorders: Secondary | ICD-10-CM

## 2016-05-05 DIAGNOSIS — G4700 Insomnia, unspecified: Secondary | ICD-10-CM | POA: Diagnosis present

## 2016-05-05 DIAGNOSIS — Z6379 Other stressful life events affecting family and household: Secondary | ICD-10-CM | POA: Diagnosis not present

## 2016-05-05 DIAGNOSIS — I10 Essential (primary) hypertension: Secondary | ICD-10-CM | POA: Diagnosis not present

## 2016-05-05 DIAGNOSIS — Z880 Allergy status to penicillin: Secondary | ICD-10-CM

## 2016-05-05 DIAGNOSIS — F909 Attention-deficit hyperactivity disorder, unspecified type: Secondary | ICD-10-CM | POA: Diagnosis not present

## 2016-05-05 HISTORY — DX: Schizoaffective disorder, unspecified: F25.9

## 2016-05-05 MED ORDER — TRAZODONE HCL 150 MG PO TABS
300.0000 mg | ORAL_TABLET | Freq: Every evening | ORAL | Status: DC | PRN
  Administered 2016-05-05: 300 mg via ORAL
  Filled 2016-05-05: qty 2

## 2016-05-05 MED ORDER — QUETIAPINE FUMARATE 200 MG PO TABS
200.0000 mg | ORAL_TABLET | Freq: Three times a day (TID) | ORAL | Status: DC
Start: 2016-05-06 — End: 2016-05-06
  Administered 2016-05-06 (×2): 200 mg via ORAL
  Filled 2016-05-05 (×6): qty 1

## 2016-05-05 MED ORDER — ALUM & MAG HYDROXIDE-SIMETH 200-200-20 MG/5ML PO SUSP: 30.0000 mL | ORAL | Status: DC | PRN

## 2016-05-05 MED ORDER — ACETAMINOPHEN 325 MG PO TABS
650.0000 mg | ORAL_TABLET | Freq: Four times a day (QID) | ORAL | Status: DC | PRN
  Administered 2016-05-05 – 2016-05-07 (×2): 650 mg via ORAL
  Filled 2016-05-05 (×2): qty 2

## 2016-05-05 MED ORDER — TRAZODONE HCL 50 MG PO TABS: 50.0000 mg | ORAL_TABLET | Freq: Every evening | ORAL | Status: DC | PRN

## 2016-05-05 MED ORDER — MAGNESIUM HYDROXIDE 400 MG/5ML PO SUSP: 30.0000 mL | Freq: Every day | ORAL | Status: DC | PRN

## 2016-05-05 MED ORDER — LABETALOL HCL 100 MG PO TABS
100.0000 mg | ORAL_TABLET | Freq: Two times a day (BID) | ORAL | Status: DC
  Administered 2016-05-05 – 2016-05-07 (×4): 100 mg via ORAL
  Filled 2016-05-05 (×7): qty 1

## 2016-05-05 NOTE — Tx Team (Signed)
Initial Treatment Plan 05/05/2016 4:38 PM Megan LawrenceKelly L Edwards [id number]    PATIENT STRESSORS: Financial difficulties Legal issue Marital or family conflict Substance abuse   PATIENT STRENGTHS: Capable of independent living Motivation for treatment/growth Religious Affiliation   PATIENT IDENTIFIED PROBLEMS: Depression  anxiety  " get my medication right."  " learn how to rebuild relationship and be a good parent."               DISCHARGE CRITERIA:  Ability to meet basic life and health needs Adequate post-discharge living arrangements Improved stabilization in mood, thinking, and/or behavior Medical problems require only outpatient monitoring Motivation to continue treatment in a less acute level of care  PRELIMINARY DISCHARGE PLAN: Attend aftercare/continuing care group Attend PHP/IOP Attend 12-step recovery group Outpatient therapy Placement in alternative living arrangements  PATIENT/FAMILY INVOLVEMENT: This treatment plan has been presented to and reviewed with the patient, [name], and/or family member. The patient and family have been given the opportunity to ask questions and make suggestions.  Bethann PunchesJane O Neddie Steedman, RN 05/05/2016, 4:38 PM

## 2016-05-05 NOTE — ED Notes (Signed)
Pt on phone at nurses' desk. 

## 2016-05-05 NOTE — ED Notes (Signed)
Pt had requested to shower - shower supplies given - pt resting quietly on bed w/eyes closed. Respirations even, unlabored.

## 2016-05-05 NOTE — ED Notes (Signed)
Patient is resting comfortably. Pt asleep

## 2016-05-05 NOTE — ED Notes (Signed)
Pt called family member to advise of plan.

## 2016-05-05 NOTE — ED Notes (Signed)
Pt given snack; lunch ordered 

## 2016-05-05 NOTE — Clinical Social Work Note (Signed)
Referred to [facility], is [place] resident.  Keon Pender, LCSW Lead Clinical Social Worker Phone:  [phone]  

## 2016-05-05 NOTE — Progress Notes (Signed)
This a 41 yr [facility] with complained of increased depression and Bipolar. Pt stated she is taking Adderall which has been making her crazy. Pt [facility] she need help with her medication. Pt stated she has legal charges "habitual DUI." Pt was alert and oriented during admission, pt answered all the questions without difficulties. Consents signed, skin/belongings search completed and pt oriented to unit. Pt stable at this time. Pt given the opportunity to express concerns and ask questions. Pt given toiletries. Will continue to monitor.

## 2016-05-05 NOTE — ED Notes (Addendum)
Woke pt to advise of tx plan - [facility] Dr Elna BreslowEappen.

## 2016-05-05 NOTE — Progress Notes (Signed)
D: Pt denies SI/HI/AVH. Pt is pleasant and cooperative. Pt stated she was ready to get help   A: Pt was offered support and encouragement. Pt was given scheduled medications. Pt was encourage to attend groups. Q 15 minute checks were done for safety.   R:Pt attends groups and interacts well with peers and staff. Pt is taking medication. Pt has no complaints at this time .Pt receptive to treatment and safety maintained on unit.

## 2016-05-05 NOTE — Progress Notes (Signed)
[facility] to Dr. Jama Flavorsobos, to room 504-2. Bed is ready now and patient can be transported any moment. AC Tina to notify [facility] RN Becky.  Megan Edwards, LCSWA Disposition staff 05/05/2016 1:41 PM

## 2016-05-05 NOTE — ED Notes (Signed)
Ativan given d/t pt noted to be anxious and restless.

## 2016-05-06 ENCOUNTER — Encounter (HOSPITAL_COMMUNITY): Payer: Self-pay | Admitting: Psychiatry

## 2016-05-06 DIAGNOSIS — Z79899 Other long term (current) drug therapy: Secondary | ICD-10-CM

## 2016-05-06 DIAGNOSIS — F4310 Post-traumatic stress disorder, unspecified: Secondary | ICD-10-CM

## 2016-05-06 DIAGNOSIS — I10 Essential (primary) hypertension: Secondary | ICD-10-CM

## 2016-05-06 DIAGNOSIS — F988 Other specified behavioral and emotional disorders with onset usually occurring in childhood and adolescence: Secondary | ICD-10-CM

## 2016-05-06 DIAGNOSIS — F312 Bipolar disorder, current episode manic severe with psychotic features: Principal | ICD-10-CM

## 2016-05-06 DIAGNOSIS — G4700 Insomnia, unspecified: Secondary | ICD-10-CM

## 2016-05-06 DIAGNOSIS — Z818 Family history of other mental and behavioral disorders: Secondary | ICD-10-CM

## 2016-05-06 DIAGNOSIS — R51 Headache: Secondary | ICD-10-CM

## 2016-05-06 DIAGNOSIS — F909 Attention-deficit hyperactivity disorder, unspecified type: Secondary | ICD-10-CM

## 2016-05-06 DIAGNOSIS — F1110 Opioid abuse, uncomplicated: Secondary | ICD-10-CM | POA: Diagnosis present

## 2016-05-06 DIAGNOSIS — Z811 Family history of alcohol abuse and dependence: Secondary | ICD-10-CM

## 2016-05-06 DIAGNOSIS — F1021 Alcohol dependence, in remission: Secondary | ICD-10-CM

## 2016-05-06 LAB — LIPID PANEL
CHOL/HDL RATIO: 5 ratio
Cholesterol: 161 mg/dL (ref 0–200)
HDL: 32 mg/dL — ABNORMAL LOW (ref >40)
LDL Cholesterol: 96 mg/dL (ref 0–99)
TRIGLYCERIDES: 167 mg/dL — AB (ref <150)
VLDL: 33 mg/dL (ref 0–40)

## 2016-05-06 LAB — TSH: TSH: 3.382 u[IU]/mL (ref 0.350–4.500)

## 2016-05-06 MED ORDER — CLONIDINE HCL 0.1 MG PO TABS
0.1000 mg | ORAL_TABLET | Freq: Every day | ORAL | Status: AC
  Administered 2016-05-10 – 2016-05-11 (×2): 0.1 mg via ORAL
  Filled 2016-05-06 (×3): qty 1

## 2016-05-06 MED ORDER — NAPROXEN 500 MG PO TABS: 500.0000 mg | ORAL_TABLET | Freq: Two times a day (BID) | ORAL | Status: DC | PRN

## 2016-05-06 MED ORDER — HYDROXYZINE HCL 25 MG PO TABS
25.0000 mg | ORAL_TABLET | Freq: Four times a day (QID) | ORAL | Status: AC | PRN
  Administered 2016-05-07 – 2016-05-09 (×3): 25 mg via ORAL
  Filled 2016-05-06 (×3): qty 1

## 2016-05-06 MED ORDER — BENZTROPINE MESYLATE 0.5 MG PO TABS
0.5000 mg | ORAL_TABLET | Freq: Every day | ORAL | Status: DC
  Administered 2016-05-06 – 2016-05-08 (×3): 0.5 mg via ORAL
  Filled 2016-05-06 (×4): qty 1

## 2016-05-06 MED ORDER — CLONIDINE HCL 0.1 MG PO TABS
0.1000 mg | ORAL_TABLET | ORAL | Status: AC
  Administered 2016-05-08 – 2016-05-09 (×4): 0.1 mg via ORAL
  Filled 2016-05-06 (×4): qty 1

## 2016-05-06 MED ORDER — PALIPERIDONE ER 3 MG PO TB24
3.0000 mg | ORAL_TABLET | Freq: Every day | ORAL | Status: DC
  Administered 2016-05-06: 3 mg via ORAL
  Filled 2016-05-06 (×2): qty 1

## 2016-05-06 MED ORDER — QUETIAPINE FUMARATE 100 MG PO TABS
100.0000 mg | ORAL_TABLET | Freq: Three times a day (TID) | ORAL | Status: DC | PRN
  Administered 2016-05-09 – 2016-05-12 (×5): 100 mg via ORAL
  Filled 2016-05-06 (×5): qty 1

## 2016-05-06 MED ORDER — QUETIAPINE FUMARATE 200 MG PO TABS
200.0000 mg | ORAL_TABLET | Freq: Every day | ORAL | Status: DC
  Filled 2016-05-06: qty 1

## 2016-05-06 MED ORDER — LITHIUM CARBONATE 300 MG PO CAPS
300.0000 mg | ORAL_CAPSULE | Freq: Two times a day (BID) | ORAL | Status: DC
  Administered 2016-05-06 – 2016-05-11 (×10): 300 mg via ORAL
  Filled 2016-05-06 (×14): qty 1

## 2016-05-06 MED ORDER — METHOCARBAMOL 500 MG PO TABS: 500.0000 mg | ORAL_TABLET | Freq: Three times a day (TID) | ORAL | Status: AC | PRN

## 2016-05-06 MED ORDER — PRAZOSIN HCL 1 MG PO CAPS
1.0000 mg | ORAL_CAPSULE | Freq: Every day | ORAL | Status: DC
  Administered 2016-05-06 – 2016-05-09 (×4): 1 mg via ORAL
  Filled 2016-05-06 (×6): qty 1

## 2016-05-06 MED ORDER — LOPERAMIDE HCL 2 MG PO CAPS: 2.0000 mg | ORAL_CAPSULE | ORAL | Status: AC | PRN

## 2016-05-06 MED ORDER — TRAZODONE HCL 100 MG PO TABS: 100.0000 mg | ORAL_TABLET | Freq: Every evening | ORAL | Status: DC | PRN

## 2016-05-06 MED ORDER — LORAZEPAM 1 MG PO TABS
1.0000 mg | ORAL_TABLET | Freq: Four times a day (QID) | ORAL | Status: DC | PRN
  Administered 2016-05-07 – 2016-05-10 (×6): 1 mg via ORAL
  Filled 2016-05-06 (×6): qty 1

## 2016-05-06 MED ORDER — LORAZEPAM 2 MG/ML IJ SOLN: 1.0000 mg | Freq: Four times a day (QID) | INTRAMUSCULAR | Status: DC | PRN

## 2016-05-06 MED ORDER — CLONIDINE HCL 0.1 MG PO TABS
0.1000 mg | ORAL_TABLET | Freq: Four times a day (QID) | ORAL | Status: AC
  Administered 2016-05-06 – 2016-05-07 (×7): 0.1 mg via ORAL
  Filled 2016-05-06 (×7): qty 1

## 2016-05-06 MED ORDER — MIRTAZAPINE 7.5 MG PO TABS
7.5000 mg | ORAL_TABLET | Freq: Every day | ORAL | Status: DC
  Administered 2016-05-06: 7.5 mg via ORAL
  Filled 2016-05-06 (×3): qty 1

## 2016-05-06 MED ORDER — DICYCLOMINE HCL 20 MG PO TABS: 20.0000 mg | ORAL_TABLET | Freq: Four times a day (QID) | ORAL | Status: AC | PRN

## 2016-05-06 MED ORDER — ONDANSETRON 4 MG PO TBDP
4.0000 mg | ORAL_TABLET | Freq: Four times a day (QID) | ORAL | Status: AC | PRN
  Administered 2016-05-07 – 2016-05-10 (×2): 4 mg via ORAL
  Filled 2016-05-06 (×2): qty 1

## 2016-05-06 NOTE — Social Work (Signed)
Referred to [facility], is [place] resident.  Anne Cunningham, LCSW Lead Clinical Social Worker Phone:  [phone]  

## 2016-05-06 NOTE — Tx Team (Signed)
Interdisciplinary Treatment and Diagnostic Plan Update  05/06/2016 Time of Session: 9:30am Megan Edwards MRN: [id number]  Principal Diagnosis: Bipolar I disorder, most recent episode manic, severe with psychotic features ([place])  Secondary Diagnoses: Principal Problem:   Bipolar I disorder, most recent episode manic, severe with psychotic features ([place]) Active Problems:   Chronic pain syndrome   Opioid use disorder, mild, in controlled environment   Essential hypertension   Alcohol use disorder, moderate, in early remission, dependence ([place])   Current Medications:  Current Facility-Administered Medications  Medication Dose Route Frequency Provider Last Rate Last Dose  . acetaminophen (TYLENOL) tablet 650 mg  650 mg Oral Q6H PRN Kerrie Buffalo, NP   650 mg at 05/05/16 2110  . alum & mag hydroxide-simeth (MAALOX/MYLANTA) 200-200-20 MG/5ML suspension 30 mL  30 mL Oral Q4H PRN Kerrie Buffalo, NP      . benztropine (COGENTIN) tablet 0.5 mg  0.5 mg Oral Q2000 Saramma Eappen, MD      . cloNIDine (CATAPRES) tablet 0.1 mg  0.1 mg Oral QID Ursula Alert, MD   0.1 mg at 05/06/16 1650   Followed by  . [START ON 05/08/2016] cloNIDine (CATAPRES) tablet 0.1 mg  0.1 mg Oral BH-qamhs Ursula Alert, MD       Followed by  . [START ON 05/10/2016] cloNIDine (CATAPRES) tablet 0.1 mg  0.1 mg Oral QAC breakfast Saramma Eappen, MD      . dicyclomine (BENTYL) tablet 20 mg  20 mg Oral Q6H PRN Ursula Alert, MD      . hydrOXYzine (ATARAX/VISTARIL) tablet 25 mg  25 mg Oral Q6H PRN Saramma Eappen, MD      . labetalol (NORMODYNE) tablet 100 mg  100 mg Oral BID [name], NP   100 mg at 05/06/16 1650  . lithium carbonate capsule 300 mg  300 mg Oral BID WC Saramma Eappen, MD   300 mg at 05/06/16 1650  . loperamide (IMODIUM) capsule 2-4 mg  2-4 mg Oral PRN Saramma Eappen, MD      . LORazepam (ATIVAN) tablet 1 mg  1 mg Oral Q6H PRN Ursula Alert, MD       Or  . LORazepam (ATIVAN) injection 1 mg  1 mg  Intramuscular Q6H PRN Ursula Alert, MD      . magnesium hydroxide (MILK OF MAGNESIA) suspension 30 mL  30 mL Oral Daily PRN Kerrie Buffalo, NP      . methocarbamol (ROBAXIN) tablet 500 mg  500 mg Oral Q8H PRN Saramma Eappen, MD      . mirtazapine (REMERON) tablet 7.5 mg  7.5 mg Oral QHS Saramma Eappen, MD      . naproxen (NAPROSYN) tablet 500 mg  500 mg Oral BID PRN Ursula Alert, MD      . ondansetron (ZOFRAN-ODT) disintegrating tablet 4 mg  4 mg Oral Q6H PRN Saramma Eappen, MD      . paliperidone (INVEGA) 24 hr tablet 3 mg  3 mg Oral Q2000 Saramma Eappen, MD      . prazosin (MINIPRESS) capsule 1 mg  1 mg Oral QHS Saramma Eappen, MD      . QUEtiapine (SEROQUEL) tablet 100 mg  100 mg Oral TID PRN Ursula Alert, MD      . Derrill Memo ON 05/07/2016] QUEtiapine (SEROQUEL) tablet 200 mg  200 mg Oral QHS Saramma Eappen, MD      . traZODone (DESYREL) tablet 100 mg  100 mg Oral QHS PRN Ursula Alert, MD        PTA  Medications: Prescriptions Prior to Admission  Medication Sig Dispense Refill Last Dose  . ADDERALL XR 30 MG 24 hr capsule Take 30 mg by mouth 2 (two) times daily.  0 05/03/2016 at am  . BIOTIN PO Take 1 tablet by mouth every morning.   05/02/2016 at am  . famotidine (PEPCID AC) 10 MG chewable tablet Chew 10 mg by mouth See admin instructions. Two to three times a day before meals   Past Week at Unknown time  . labetalol (NORMODYNE) 100 MG tablet Take 1 tablet (100 mg total) by mouth 2 (two) times daily. (Patient taking differently: Take 100 mg by mouth 3 (three) times daily. ) 60 tablet 1 05/01/2016 at pm  . promethazine (PHENERGAN) 6.25 MG/5ML syrup Take 20 mLs (25 mg total) by mouth every 6 (six) hours as needed for nausea or vomiting. 120 mL 0 Past Month at Unknown time  . QUEtiapine (SEROQUEL) 200 MG tablet Take 1 tablet (200 mg total) by mouth 3 (three) times daily. 90 tablet 1 05/02/2016 at am  . SUBOXONE 8-2 MG FILM Place 1 Film under the tongue 2 (two) times daily.   0 05/02/2016 at Unknown  time  . traZODone (DESYREL) 150 MG tablet Take 300 mg by mouth at bedtime as needed for sleep.   Past Week at Unknown time    Treatment Modalities: Medication Management, Group therapy, Case management,  1 to 1 session with clinician, Psychoeducation, Recreational therapy.  Patient Stressors: Paediatric nurse issue Marital or family conflict Substance abuse  Patient Strengths: Capable of independent living Motivation for treatment/growth Religious Affiliation  Physician Treatment Plan for Primary Diagnosis: Bipolar I disorder, most recent episode manic, severe with psychotic features ([facility]) Long Term Goal(s): Improvement in symptoms so as ready for discharge  Short Term Goals: Compliance with prescribed medications will improve Ability to identify triggers associated with substance abuse/mental health issues will improve Compliance with prescribed medications will improve Ability to identify triggers associated with substance abuse/mental health issues will improve  Medication Management: Evaluate patient's response, side effects, and tolerance of medication regimen.  Therapeutic Interventions: 1 to 1 sessions, Unit Group sessions and Medication administration.  Evaluation of Outcomes: Not Met  Physician Treatment Plan for Secondary Diagnosis: Principal Problem:   Bipolar I disorder, most recent episode manic, severe with psychotic features ([facility]) Active Problems:   Chronic pain syndrome   Opioid use disorder, mild, in controlled environment   Essential hypertension   Alcohol use disorder, moderate, in early remission, dependence ([facility])   Long Term Goal(s): Improvement in symptoms so as ready for discharge  Short Term Goals: Compliance with prescribed medications will improve Ability to identify triggers associated with substance abuse/mental health issues will improve Compliance with prescribed medications will improve Ability to identify triggers associated with  substance abuse/mental health issues will improve  Medication Management: Evaluate patient's response, side effects, and tolerance of medication regimen.  Therapeutic Interventions: 1 to 1 sessions, Unit Group sessions and Medication administration.  Evaluation of Outcomes: Not Met   RN Treatment Plan for Primary Diagnosis: Bipolar I disorder, most recent episode manic, severe with psychotic features ([facility]) Long Term Goal(s): Knowledge of disease and therapeutic regimen to maintain health will improve  Short Term Goals: Ability to participate in decision making will improve, Ability to verbalize feelings will improve and Ability to disclose and discuss suicidal ideas  Medication Management: RN will administer medications as ordered by provider, will assess and evaluate patient's response and provide education to patient for prescribed  medication. RN will report any adverse and/or side effects to prescribing provider.  Therapeutic Interventions: 1 on 1 counseling sessions, Psychoeducation, Medication administration, Evaluate responses to treatment, Monitor vital signs and CBGs as ordered, Perform/monitor CIWA, COWS, AIMS and Fall Risk screenings as ordered, Perform wound care treatments as ordered.  Evaluation of Outcomes: Not Met   Recreational Therapy Treatment Plan for Primary Diagnosis: Bipolar I disorder, most recent episode manic, severe with psychotic features ([place])  Long Term Goal(s): LTG- Patient will participate in recreation therapy tx in at least 2 group sessions without prompting from LRT.  Short Term Goals:  Patient will verbalize application of 2 stress management techniques to be used post dc by conclusion of recreation therapy tx.  Treatment Modalities: Group and Pet Therapy  Therapeutic Interventions: Psychoeducation  Evaluation of Outcomes: Progressing   LCSW Treatment Plan for Primary Diagnosis: Bipolar I disorder, most recent episode manic, severe with psychotic  features ([place]) Long Term Goal(s): Safe transition to appropriate next level of care at discharge, Engage patient in therapeutic group addressing interpersonal concerns.  Short Term Goals: Engage patient in aftercare planning with referrals and resources, Identify triggers associated with mental health/substance abuse issues and Increase skills for wellness and recovery  Therapeutic Interventions: Assess for all discharge needs, 1 to 1 time with Social worker, Explore available resources and support systems, Assess for adequacy in community support network, Educate family and significant other(s) on suicide prevention, Complete Psychosocial Assessment, Interpersonal group therapy.  Evaluation of Outcomes: Not Met   Progress in Treatment: Attending groups: Pt is new to milieu, continuing to assess  Participating in groups: Pt is new to milieu, continuing to assess  Taking medication as prescribed: Yes, MD continues to assess for medication changes as needed Toleration medication: Yes, no side effects reported at this time Family/Significant other contact made: No, Pt declines Patient understands diagnosis: Continuing to assess Discussing patient identified problems/goals with staff: Yes Medical problems stabilized or resolved: Yes Denies suicidal/homicidal ideation: Yes Issues/concerns per patient self-inventory: None Other: N/A  New problem(s) identified: None identified at this time.   New Short Term/Long Term Goal(s): None identified at this time.   Discharge Plan or Barriers: CSW will assess for appropriate discharge plan and relevant barriers.   Reason for Continuation of Hospitalization: Anxiety Depression Mania Medication stabilization Suicidal ideation Withdrawal symptoms  Estimated Length of Stay: 3-5 days  Attendees: Patient: 05/06/2016  5:31 PM  Physician: Dr. Shea Evans 05/06/2016  5:31 PM  Nursing: Minta Balsam, RN 05/06/2016  5:31 PM  RN Care Manager: Lars Pinks, RN 05/06/2016  5:31 PM  Social Worker: Adriana Reams, LCSW; Matthew Saras, [place] 05/06/2016  5:31 PM  Recreational Therapist:  05/06/2016  5:31 PM  Other: Lindell Spar, NP; Samuel Jester, NP 05/06/2016  5:31 PM  Other:  05/06/2016  5:31 PM  Other: 05/06/2016  5:31 PM    Scribe for Treatment Team: Gladstone Lighter, LCSW 05/06/2016 5:31 PM
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
## 2016-05-06 NOTE — [facility] Suicide Risk Assessment (Signed)
[facility] INPATIENT:  Family/Significant Other Suicide Prevention Education  Suicide Prevention Education:  Patient Refusal for Family/Significant Other Suicide Prevention Education: The patient Megan Edwards has refused to provide written consent for family/significant other to be provided Family/Significant Other Suicide Prevention Education during admission and/or prior to discharge.  Physician notified.  Verdene LennertLauren C Luwana Butrick 05/06/2016, 5:34 PM

## 2016-05-06 NOTE — Progress Notes (Signed)
DAR NOTE: Patient presents with anxious affect and mood.  Reports poor night sleep.  Denies pain, auditory and visual hallucinations.  Described energy level as low and  Concentration as poor.  Rates depression at 9, hopelessness at 9, and anxiety at 10.  Reports withdrawal symptoms of agitation, runny nose, nausea, and irritability on self inventory form.  Maintained on routine safety checks.  Medications given as prescribed.  Support and encouragement offered as needed.  States goal for today is "my depression."  Patient remained in her room most of this shift.  minimal interaction with staff and peers.  Offered no complaint.

## 2016-05-06 NOTE — Plan of Care (Signed)
Problem: Education: Goal: Will be free of psychotic symptoms Outcome: Progressing Patient pleasant and cooperative with care this shift. No distress noted; no inappropriate behaviors noted.

## 2016-05-06 NOTE — Progress Notes (Signed)
Recreation Therapy Notes  Date: 05/06/16 Time: 1000 Location: 500 Hall Dayroom  Group Topic: Self-Esteem  Goal Area(s) Addresses:  Patient will identify positive ways to increase self-esteem. Patient will verbalize benefit of increased self-esteem.  Intervention: Empty picture frame worksheet, markers, colored pencils  Activity: Mirror, Mirror.  Patients were given a worksheet with an empty picture frame on it.  Patients were to draw a picture and/or us words to describe what they think of themselves.  Education:  Self-Esteem, Building control surveyorDischarge Planning.   Education Outcome: Acknowledges education/In group clarification offered/Needs additional education  Clinical Observations/Feedback: Pt did not attend group.   Caroll RancherMarjette Tory Mckissack, LRT/CTRS         [name], Sheldon Sem A 05/06/2016 11:51 AM
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
## 2016-05-06 NOTE — [facility] Group Notes (Signed)
[facility] LCSW Group Therapy  05/06/2016 3:21 PM  Type of Therapy:  Group Therapy  Participation Level:  Did not attend   Modes of Intervention:  Activity, Discussion, Education, Socialization and Support  Summary of Progress/Problems: Patients identify obstacles, self-sabotaging and enabling behaviors. Patients explore aspects of self sabotage and enabling and how to limit these self-destructive behaviors in everyday life.   Rozalynn Buege L Andoni Busch MSW, LCSWA  05/06/2016, 3:21 PM  

## 2016-05-06 NOTE — Progress Notes (Signed)
D: Patient pleasant and cooperative with care this shift but is noted to be withdrawn and with a blunted affect. A: Encourage staff/peer interaction, medication compliance, and group participation. Administer medications as ordered, maintain Q 15 minute safety checks.R: Pt compliant with medications. Pt denies SI at this time and verbally contracts for safety. No signs/symptoms of distress noted.

## 2016-05-06 NOTE — [facility] Suicide Risk Assessment (Signed)
[facility]   Nursing information obtained from:    Demographic factors:    Current Mental Status:    Loss Factors:    Historical Factors:    Risk Reduction Factors:     Total Time spent with patient: 30 minutes Principal Problem: Bipolar I disorder, most recent episode manic, severe with psychotic features [facility](HCC) Diagnosis:   Patient Active Problem List   Diagnosis Date Noted  . Opioid use disorder, mild, in controlled environment [F11.10] 05/06/2016  . Essential hypertension [I10] 05/06/2016  . Alcohol use disorder, moderate, in early remission, dependence (HCC) [F10.21] 05/06/2016  . Bipolar I disorder, most recent episode manic, severe with psychotic features (HCC) [F31.2] 01/20/2016  . Chronic pain syndrome [G89.4]   . Morbid obesity due to excess calories (HCC) [E66.01]    Subjective Data: Please see H&P.   Continued Clinical Symptoms:  Alcohol Use Disorder Identification Test Final Score (AUDIT): 0 The "Alcohol Use Disorders Identification Test", Guidelines for Use in Primary Care, Second Edition.  World Science writerHealth Organization [facility](WHO). Score between 0-7:  no or low risk or alcohol related problems. Score between 8-15:  moderate risk of alcohol related problems. Score between 16-19:  high risk of alcohol related problems. Score 20 or above:  warrants further diagnostic evaluation for alcohol dependence and treatment.   CLINICAL FACTORS:   Alcohol/Substance Abuse/Dependencies Previous Psychiatric Diagnoses and Treatments   Musculoskeletal: Strength & Muscle Tone: within normal limits Gait & Station: normal Patient leans: N/A  Psychiatric Specialty Exam: Physical Exam  ROS  Blood pressure (!) 163/103, pulse 81, temperature 98.3 F (36.8 C), temperature source Oral, resp. rate 18, height 5\' 6"  (1.676 m), weight 97.1 kg (214 lb), last menstrual period 03/09/2016, SpO2 99 %, not currently breastfeeding.Body mass index is 34.54 kg/m.                      Please see H&P.                                     COGNITIVE FEATURES THAT CONTRIBUTE TO RISK:  Closed-mindedness, Polarized thinking and Thought constriction (tunnel vision)    SUICIDE RISK:   Moderate:  Frequent suicidal ideation with limited intensity, and duration, some specificity in terms of plans, no associated intent, good self-control, limited dysphoria/symptomatology, some risk factors present, and identifiable protective factors, including available and accessible social support.  PLAN OF CARE: Please see H&P.   I certify that inpatient services furnished can reasonably be expected to improve the patient's condition.   Lidia Clavijo, MD 05/06/2016, 12:15 PM

## 2016-05-07 LAB — COMPREHENSIVE METABOLIC PANEL
ALBUMIN: 3.9 g/dL (ref 3.5–5.0)
ALK PHOS: 74 U/L (ref 38–126)
ALT: 36 U/L (ref 14–54)
AST: 18 U/L (ref 15–41)
Anion gap: 9 (ref 5–15)
BILIRUBIN TOTAL: 0.1 mg/dL — AB (ref 0.3–1.2)
BUN: 11 mg/dL (ref 6–20)
CALCIUM: 9.2 mg/dL (ref 8.9–10.3)
CO2: 30 mmol/L (ref 22–32)
CREATININE: 0.68 mg/dL (ref 0.44–1.00)
Chloride: 101 mmol/L (ref 101–111)
GFR calc Af Amer: >60 mL/min (ref >60)
GLUCOSE: 122 mg/dL — AB (ref 65–99)
POTASSIUM: 4 mmol/L (ref 3.5–5.1)
Sodium: 140 mmol/L (ref 135–145)
TOTAL PROTEIN: 7 g/dL (ref 6.5–8.1)

## 2016-05-07 LAB — CBC WITH DIFFERENTIAL/PLATELET
BASOS ABS: 0 10*3/uL (ref 0.0–0.1)
BASOS PCT: 0 %
Eosinophils Absolute: 0.3 10*3/uL (ref 0.0–0.7)
Eosinophils Relative: 4 %
HEMATOCRIT: 36.8 % (ref 36.0–46.0)
HEMOGLOBIN: 12.1 g/dL (ref 12.0–15.0)
LYMPHS PCT: 23 %
Lymphs Abs: 1.7 10*3/uL (ref 0.7–4.0)
MCH: 27.3 pg (ref 26.0–34.0)
MCHC: 32.9 g/dL (ref 30.0–36.0)
MCV: 83.1 fL (ref 78.0–100.0)
MONO ABS: 0.4 10*3/uL (ref 0.1–1.0)
Monocytes Relative: 5 %
NEUTROS ABS: 4.9 10*3/uL (ref 1.7–7.7)
NEUTROS PCT: 68 %
Platelets: 298 10*3/uL (ref 150–400)
RBC: 4.43 MIL/uL (ref 3.87–5.11)
RDW: 13.9 % (ref 11.5–15.5)
WBC: 7.2 10*3/uL (ref 4.0–10.5)

## 2016-05-07 LAB — HEMOGLOBIN A1C
Hgb A1c MFr Bld: 5.4 % (ref 4.8–5.6)
Mean Plasma Glucose: 108 mg/dL

## 2016-05-07 LAB — PROLACTIN: Prolactin: 19.4 ng/mL (ref 4.8–23.3)

## 2016-05-07 MED ORDER — QUETIAPINE FUMARATE 100 MG PO TABS
100.0000 mg | ORAL_TABLET | Freq: Every day | ORAL | Status: DC
  Administered 2016-05-07: 100 mg via ORAL
  Filled 2016-05-07 (×2): qty 1

## 2016-05-07 MED ORDER — LABETALOL HCL 200 MG PO TABS
200.0000 mg | ORAL_TABLET | Freq: Every evening | ORAL | Status: DC
  Administered 2016-05-07 – 2016-05-12 (×6): 200 mg via ORAL
  Filled 2016-05-07 (×7): qty 1
  Filled 2016-05-07: qty 2

## 2016-05-07 MED ORDER — TRAZODONE HCL 50 MG PO TABS: 50.0000 mg | ORAL_TABLET | Freq: Every evening | ORAL | Status: DC | PRN

## 2016-05-07 MED ORDER — DOXEPIN HCL 10 MG PO CAPS
20.0000 mg | ORAL_CAPSULE | Freq: Every day | ORAL | Status: DC
  Administered 2016-05-07: 20 mg via ORAL
  Filled 2016-05-07 (×2): qty 2

## 2016-05-07 MED ORDER — LABETALOL HCL 100 MG PO TABS
100.0000 mg | ORAL_TABLET | Freq: Every day | ORAL | Status: DC
  Administered 2016-05-08 – 2016-05-13 (×6): 100 mg via ORAL
  Filled 2016-05-07 (×8): qty 1

## 2016-05-07 MED ORDER — PALIPERIDONE ER 6 MG PO TB24
6.0000 mg | ORAL_TABLET | Freq: Every day | ORAL | Status: DC
  Administered 2016-05-07 – 2016-05-08 (×2): 6 mg via ORAL
  Filled 2016-05-07 (×3): qty 1

## 2016-05-07 NOTE — Progress Notes (Signed)
Recreation Therapy Notes  Date: 05/07/16 Time: 1000 Location: [address] Dayroom  Group Topic: Wellness  Goal Area(s) Addresses:  Patient will define components of whole wellness. Patient will verbalize benefit of whole wellness.  Intervention:  Chairs, beach ball   Activity: Keep it ContractorGoing Volleyball.  Patients were arranged in a circle.  Patients were to hit the ball back and forth to each other without the ball coming to a complete stop.  LRT would keep count of how many hits the patients got before the ball stops.  Education:Wellness, Discharge Planning.   Education Outcome: Acknowledges education/In group clarification offered/Needs additional education.   Clinical Observations/Feedback:  Pt did not attend group.   Jalexus Brett Lindasy, LRT/CTRS        [name], An[place] Monestime A 05/07/2016 11:50 AM

## 2016-05-07 NOTE — Progress Notes (Signed)
[facility] MD Progress Note  05/07/2016 1:53 PM Megan Edwards  MRN:  [id number] Subjective:  Patient states " I am tired , I do not feel too good. I know I messed up , I was not taking my medications like I should have .'  Objective:Patient seen and chart reviewed.Discussed patient with treatment team.  Pt today is seen as withdrawn , depressed , anxious and isolative. Per RN pt continues to have variable BP , withdrawal sx like sweats , nausea, malaise and restlessness. Discussed this with pt , she reported that she was off of her Labetolol for few days and hence that is also likely contributing to her elevated BP. Pt also reports poor sleep last night. Pt advised to make use of PRN medications for withdrawal sx . Continue to encourage and support.   Principal Problem: Bipolar I disorder, most recent episode manic, severe with psychotic features (Megan Edwards) Diagnosis:   Patient Active Problem List   Diagnosis Date Noted  . Opioid use disorder, mild, in controlled environment [F11.10] 05/06/2016  . Essential hypertension [I10] 05/06/2016  . Alcohol use disorder, moderate, in early remission, dependence (Megan Edwards) [F10.21] 05/06/2016  . Bipolar I disorder, most recent episode manic, severe with psychotic features (Megan Edwards) [F31.2] 01/20/2016  . Chronic pain syndrome [G89.4]   . Morbid obesity due to excess calories (Megan Edwards) [E66.01]    Total Time spent with patient: 30 minutes  Past Psychiatric History: Please see H&P.   Past Medical History:  Past Medical History:  Diagnosis Date  . Abnormal Pap smear   . Bipolar 1 disorder (Megan Edwards)   . Depression   . Genital herpes 2000  . Hypertension   . Infection    UTI  . Ovarian cyst   . Schizoaffective disorder (Megan Edwards) 05/05/2016  . SVT (supraventricular tachycardia) (HCC)     Past Surgical History:  Procedure Laterality Date  . addenoidectomy    . BREAST ENHANCEMENT SURGERY    . BREAST SURGERY    . KNEE SURGERY    . KNEE SURGERY Left   . TONSILLECTOMY     . WISDOM TOOTH EXTRACTION     Family History:  Family History  Problem Relation Age of Onset  . Alcoholism    . Leukemia    . Cervical cancer    . Bone cancer    . Cirrhosis    . Diabetes Mellitus II    . Depression    . Hypertension    . Thyroid cancer    . Diabetes Mellitus II Mother   . Hypertension Mother   . Alcoholism Father   . Mental illness Paternal Grandmother    Family Psychiatric  History: Please see H&P.  Social History: Please see H&P.  History  Alcohol Use No    Comment: 4 40's beer daily- not currently     History  Drug Use    Comment: former    Social History   Social History  . Marital status: Divorced    Spouse name: N/A  . Number of children: N/A  . Years of education: N/A   Occupational History  . Megan Edwards   Social History Main Topics  . Smoking status: Never Smoker  . Smokeless tobacco: Never Used  . Alcohol use No     Comment: 4 40's beer daily- not currently  . Drug use: Yes     Comment: former  . Sexual activity: Not Currently    Birth control/ protection: Abstinence   Other Topics Concern  .  None   Social History Narrative  . None   Additional Social History:    Pain Medications: see MAR Prescriptions: see MAR Over the Counter: see MAR History of alcohol / drug use?: Yes Name of Substance 1: Suboxone prescribed by [facility] 1 - Age of First Use: started 8 yrs ago  1 - Amount (size/oz): 16 mg's for the past 3 weeks 1 - Frequency: daily  1 - Duration: 8 years total; recently decreased dosage to 34m's 3 weeks ago 1 - Last Use / Amount: 05/02/2016                  Sleep: Poor  Appetite:  Poor  Current Medications: Current Facility-Administered Medications  Medication Dose Route Frequency Provider Last Rate Last Dose  . acetaminophen (TYLENOL) tablet 650 mg  650 mg Oral Q6H PRN SKerrie Buffalo NP   650 mg at 05/07/16 0759  . alum & mag hydroxide-simeth (MAALOX/MYLANTA) 200-200-20  MG/5ML suspension 30 mL  30 mL Oral Q4H PRN SKerrie Buffalo NP      . benztropine (COGENTIN) tablet 0.5 mg  0.5 mg Oral Q2000 Tyrez Berrios, MD   0.5 mg at 05/06/16 2051  . cloNIDine (CATAPRES) tablet 0.1 mg  0.1 mg Oral QID SUrsula Alert MD   0.1 mg at 05/07/16 1154   Followed by  . [START ON 05/08/2016] cloNIDine (CATAPRES) tablet 0.1 mg  0.1 mg Oral BH-qamhs SUrsula Alert MD       Followed by  . [START ON 05/10/2016] cloNIDine (CATAPRES) tablet 0.1 mg  0.1 mg Oral QAC breakfast Gargi Berch, MD      . dicyclomine (BENTYL) tablet 20 mg  20 mg Oral Q6H PRN Carmell Elgin, MD      . doxepin (SINEQUAN) capsule 20 mg  20 mg Oral QHS Aminta Sakurai, MD      . hydrOXYzine (ATARAX/VISTARIL) tablet 25 mg  25 mg Oral Q6H PRN SUrsula Alert MD   25 mg at 05/07/16 0942  . [START ON 05/08/2016] labetalol (NORMODYNE) tablet 100 mg  100 mg Oral QAC breakfast Akia Desroches, MD      . labetalol (NORMODYNE) tablet 200 mg  200 mg Oral QPM Niajah Sipos, MD      . lithium carbonate capsule 300 mg  300 mg Oral BID WC SUrsula Alert MD   300 mg at 05/07/16 0756  . loperamide (IMODIUM) capsule 2-4 mg  2-4 mg Oral PRN Mozel Burdett, MD      . LORazepam (ATIVAN) tablet 1 mg  1 mg Oral Q6H PRN SUrsula Alert MD       Or  . LORazepam (ATIVAN) injection 1 mg  1 mg Intramuscular Q6H PRN SUrsula Alert MD      . magnesium hydroxide (MILK OF MAGNESIA) suspension 30 mL  30 mL Oral Daily PRN SKerrie Buffalo NP      . methocarbamol (ROBAXIN) tablet 500 mg  500 mg Oral Q8H PRN Zahir Eisenhour, MD      . naproxen (NAPROSYN) tablet 500 mg  500 mg Oral BID PRN SUrsula Alert MD      . ondansetron (ZOFRAN-ODT) disintegrating tablet 4 mg  4 mg Oral Q6H PRN SUrsula Alert MD   4 mg at 05/07/16 1215  . paliperidone (INVEGA) 24 hr tablet 6 mg  6 mg Oral Q2000 Saraiya Kozma, MD      . prazosin (MINIPRESS) capsule 1 mg  1 mg Oral QHS SUrsula Alert MD   1 mg at 05/06/16 2052  .  QUEtiapine (SEROQUEL) tablet 100 mg  100 mg  Oral TID PRN Ursula Alert, MD      . QUEtiapine (SEROQUEL) tablet 100 mg  100 mg Oral QHS Franciscojavier Wronski, MD      . traZODone (DESYREL) tablet 50 mg  50 mg Oral QHS PRN Ursula Alert, MD        Lab Results:  Results for orders placed or performed during the Edwards encounter of 05/05/16 (from the past 48 hour(s))  Hemoglobin A1c     Status: None   Collection Time: 05/06/16  6:13 AM  Result Value Ref Range   Hgb A1c MFr Bld 5.4 4.8 - 5.6 %    Comment: (NOTE)         Pre-diabetes: 5.7 - 6.4         Diabetes: >6.4         Glycemic control for adults with diabetes: <7.0    Mean Plasma Glucose 108 mg/dL    Comment: (NOTE) Performed At: [facility] [place], [place] [id number] Lindon Romp MD [id number] Performed at [facility], [place] [address] [place], [place] [address]   TSH     Status: None   Collection Time: 05/06/16  6:13 AM  Result Value Ref Range   TSH 3.382 0.350 - 4.500 uIU/mL    Comment: Performed by a 3rd Generation assay with a functional sensitivity of <=0.01 uIU/mL. Performed at [facility], [place] [address], [place], [place] [address]   Lipid panel     Status: Abnormal   Collection Time: 05/06/16  6:13 AM  Result Value Ref Range   Cholesterol 161 0 - 200 mg/dL   Triglycerides 167 (H) <150 mg/dL   HDL 32 (L) >40 mg/dL   Total CHOL/HDL Ratio 5.0 RATIO   VLDL 33 0 - 40 mg/dL   LDL Cholesterol 96 0 - 99 mg/dL    Comment:        Total Cholesterol/HDL:CHD Risk Coronary Heart Disease Risk Table                     Men   Women  1/2 Average Risk   3.4   3.3  Average Risk       5.0   4.4  2 X Average Risk   9.6   7.1  3 X Average Risk  23.4   11.0        Use the calculated Patient Ratio above and the CHD Risk Table to determine the patient's CHD Risk.        ATP III CLASSIFICATION (LDL):  <100     mg/dL   Optimal  100-129  mg/dL   Near or Above                    Optimal  130-159  mg/dL    Borderline  160-189  mg/dL   High  >190     mg/dL   Very High Performed at [place] [address] [place], [place] [address]   Prolactin     Status: None   Collection Time: 05/06/16  6:13 AM  Result Value Ref Range   Prolactin 19.4 4.8 - 23.3 ng/mL    Comment: (NOTE) Performed At: [facility] [place], [place] [id number] Lindon Romp MD [id number] Performed at [facility], [place] [address] [place], [place] [address]   CBC with Differential/Platelet  Status: None   Collection Time: 05/07/16  6:34 AM  Result Value Ref Range   WBC 7.2 4.0 - 10.5 K/uL   RBC 4.43 3.87 - 5.11 MIL/uL   Hemoglobin 12.1 12.0 - 15.0 g/dL   HCT 36.8 36.0 - 46.0 %   MCV 83.1 78.0 - 100.0 fL   MCH 27.3 26.0 - 34.0 pg   MCHC 32.9 30.0 - 36.0 g/dL   RDW 13.9 11.5 - 15.5 %   Platelets 298 150 - 400 K/uL   Neutrophils Relative % 68 %   Neutro Abs 4.9 1.7 - 7.7 K/uL   Lymphocytes Relative 23 %   Lymphs Abs 1.7 0.7 - 4.0 K/uL   Monocytes Relative 5 %   Monocytes Absolute 0.4 0.1 - 1.0 K/uL   Eosinophils Relative 4 %   Eosinophils Absolute 0.3 0.0 - 0.7 K/uL   Basophils Relative 0 %   Basophils Absolute 0.0 0.0 - 0.1 K/uL    Comment: Performed at [facility], [place] [address] [place], [place] [address]  Comprehensive metabolic panel     Status: Abnormal   Collection Time: 05/07/16  6:34 AM  Result Value Ref Range   Sodium 140 135 - 145 mmol/L   Potassium 4.0 3.5 - 5.1 mmol/L   Chloride 101 101 - 111 mmol/L   CO2 30 22 - 32 mmol/L   Glucose, Bld 122 (H) 65 - 99 mg/dL   BUN 11 6 - 20 mg/dL   Creatinine, Ser 0.68 0.44 - 1.00 mg/dL   Calcium 9.2 8.9 - 10.3 mg/dL   Total Protein 7.0 6.5 - 8.1 g/dL   Albumin 3.9 3.5 - 5.0 g/dL   AST 18 15 - 41 U/L   ALT 36 14 - 54 U/L   Alkaline Phosphatase 74 38 - 126 U/L   Total Bilirubin 0.1 (L) 0.3 - 1.2 mg/dL   GFR calc non Af Amer >60 >60 mL/min   GFR calc Af Amer >60 >60 mL/min     Comment: (NOTE) The eGFR has been calculated using the CKD EPI equation. This calculation has not been validated in all clinical situations. eGFR's persistently <60 mL/min signify possible Chronic Kidney Disease.    Anion gap 9 5 - 15    Comment: Performed at [facility], [place] [address] [place], [place] [address]    Blood Alcohol level:  Lab Results  Component Value Date   [facility] <5 05/02/2016   ETH <5 25/36/6440    Metabolic Disorder Labs: Lab Results  Component Value Date   HGBA1C 5.4 05/06/2016   MPG 108 05/06/2016   MPG 117 01/22/2016   Lab Results  Component Value Date   PROLACTIN 19.4 05/06/2016   Lab Results  Component Value Date   CHOL 161 05/06/2016   TRIG 167 (H) 05/06/2016   HDL 32 (L) 05/06/2016   CHOLHDL 5.0 05/06/2016   VLDL 33 05/06/2016   LDLCALC 96 05/06/2016   LDLCALC 108 (H) 01/22/2016    Physical Findings: AIMS: Facial and Oral Movements Muscles of Facial Expression: None, normal Lips and Perioral Area: None, normal Jaw: None, normal Tongue: None, normal,Extremity Movements Upper (arms, wrists, hands, fingers): None, normal Lower (legs, knees, ankles, toes): None, normal, Trunk Movements Neck, shoulders, hips: None, normal, Overall Severity Severity of abnormal movements (highest score from questions above): None, normal Incapacitation due to abnormal movements: None, normal Patient's awareness of abnormal movements (rate only patient's report): No Awareness, Dental Status Current problems with teeth and/or dentures?:  No Does patient usually wear dentures?: No  CIWA:  CIWA-Ar Total: 2 COWS:  COWS Total Score: 4  Musculoskeletal: Strength & Muscle Tone: within normal limits Gait & Station: seen in bed Patient leans: N/A  Psychiatric Specialty Exam: Physical Exam  Nursing note and vitals reviewed.   Review of Systems  Constitutional: Positive for diaphoresis and malaise/fatigue.  Psychiatric/Behavioral:  Positive for depression and substance abuse. The patient is nervous/anxious.   All other systems reviewed and are negative.   Blood pressure 111/67, pulse (!) 55, temperature 98.6 F (37 C), temperature source Oral, resp. rate 16, height _0  (1.676 m), weight 97.1 kg (214 lb), last menstrual period 03/09/2016, SpO2 99 %, not currently breastfeeding.Body mass index is 34.54 kg/m.  General Appearance: Guarded  Eye Contact:  Minimal  Speech:  Slow  Volume:  Decreased  Mood:  Anxious and Dysphoric  Affect:  Depressed  Thought Process:  Goal Directed and Descriptions of Associations: Intact  Orientation:  Full (Time, Place, and Person)  Thought Content:  Hallucinations: Auditory, Paranoid Ideation and Rumination, extremely paranoid , although progressing , continues to does not elaborate her AH/VH - but is not preoccupied with it   Suicidal Thoughts:  No  Homicidal Thoughts:  No  Memory:  Immediate;   Fair Recent;   Fair Remote;   Fair  Judgement:  Impaired  Insight:  Shallow  Psychomotor Activity:  Restlessness  Concentration:  Concentration: Poor and Attention Span: Poor  Recall:  [employer] of Knowledge:  Fair  Language:  Fair  Akathisia:  No  Handed:  Right  AIMS (if indicated):     Assets:  Desire for Improvement  ADL's:  Intact  Cognition:  WNL  Sleep:  Number of Hours: 6.75     Treatment Plan Summary:Patient today is seen as withdrawn, dysphoric , isolative , anxious , poor sleep , withdrawal sx. Continue to encourage and support.  Bipolar I disorder, most recent episode manic, severe with psychotic features ([place]) unstable  Will continue today 05/07/16  plan as below except where it is noted.   Daily contact with patient to assess and evaluate symptoms and progress in treatment, Medication management and Plan see below For Bipolar disorder: Cross taper Seroquel with Invega . Plan to discharge on LAI if she tolerates Invega.  Li 300 mg po bid for mood lability. Li  level - 05/11/16.  For PTSD: Referral to CBT. Minipress 1 mg po qhs for nightmares.  For Insomnia: Discontinue Remeron for lack of efficacy. Start Doxepin 20 mg po qhs . Change Trazodone to 50 mg po qhs prn.  For opioid use do: Pt wants to be taken off of suboxone and she has not had it in 4 days prior to admission. COWS/clonidine protocol.  For alcohol use disorder in early remission: Continue substance abuse counseling.  For HTN: Increase Labetolol to 300 mg po daily in divided doses. Continue to monitor.  Will continue to monitor vitals ,medication compliance and treatment side effects while patient is here.   Will monitor for medical issues as well as call consult as needed.   Reviewed labs - hb- low, hct - low, calcium ionized - low , uds- pos for bzd,stimulants , tsh - wnl ,repeat CBC with diff- wnl ( 03/06/17) , repeat cmp- wnl  , pending hba1c, pl .  CSW will continue working on disposition.   Patient to participate in therapeutic milieu . Cherice Glennie, MD 05/07/2016, 1:53 PM

## 2016-05-07 NOTE — Progress Notes (Signed)
Adult Psychoeducational Group Note  Date:  05/07/2016 Time:  8:40 PM  Group Topic/Focus:  Wrap-Up Group:   The focus of this group is to help patients review their daily goal of treatment and discuss progress on daily workbooks.  Participation Level:  Did Not Attend  Additional Comments:  Pt was invited to group, however stayed in bed.  Caswell CorwinOwen, Breylan Lefevers C 05/07/2016, 8:40 PM

## 2016-05-07 NOTE — Progress Notes (Signed)
Recreation Therapy Notes  INPATIENT RECREATION THERAPY ASSESSMENT  Patient Details Name: Megan Edwards MRN: [id number] DOB: 02/19/1976 Today's Date: 05/07/2016  Patient Stressors: Other (Comment) ("I don't know")  Pt stated she was here to get her medications straight.  Coping Skills:   Isolate, Substance Abuse, Avoidance, Exercise, Talking, Music  Personal Challenges: Communication, Concentration, Decision-Making, Expressing Yourself, Problem-Solving, Relationships, Self-Esteem/Confidence, Social Interaction, Stress Management, Trusting Others  Leisure Interests (2+):  Social - Family, Sports - Exercise (Comment)  Awareness of Community Resources:  Yes  Community Resources:  Gym  Current Use: Yes  Patient Strengths:  Engineer, drillingLeadership; Multi-task  Patient Identified Areas of Improvement:  Communication; Get along with others  Current Recreation Participation:  "Was just getting started"  Patient Goal for Hospitalization:  "Get medications under control, learn to get along with others, stress management and detox"  [place]  [place]  Current [place] (including self-harm):  No  Current HI:  No  Consent to Intern Participation: N/A   Caroll RancherMarjette Ayva Veilleux, LRT/CTRS  Lillia AbedLindsay, Zelma Snead A 05/07/2016, 11:56 AM

## 2016-05-07 NOTE — [facility] Group Notes (Signed)
[facility] LCSW Group Therapy  05/07/2016 , 1:43 PM   Type of Therapy:  Group Therapy  Participation Level:  Active  Participation Quality:  Attentive  Affect:  Appropriate  Cognitive:  Alert  Insight:  Improving  Engagement in Therapy:  Engaged  Modes of Intervention:  Discussion, Exploration and Socialization  Summary of Progress/Problems: Today's group focused on the term Diagnosis.  Participants were asked to define the term, and then pronounce whether it is a negative, positive or neutral term.  Invited.  Chose to not attend.  [name], Julieth Tugman B 05/07/2016 , 1:43 PM

## 2016-05-07 NOTE — Progress Notes (Signed)
D: Patient pleasant and cooperative with care this shift. A: Encourage staff/peer interaction, medication compliance, and group participation. Administer medications as ordered, maintain Q 15 minute safety checks. R: Pt compliant with medications. Pt denies SI at this time and verbally contracts for safety. No signs/symptoms of distress noted.

## 2016-05-07 NOTE — Plan of Care (Signed)
Problem: Safety: Goal: Ability to remain free from injury will improve Outcome: Progressing Q 15 minutes safety checks maintained on unit without self harm gestures to note thus far.  Problem: Nutritional: Goal: Ability to achieve adequate nutritional intake will improve Outcome: Progressing Pt received PRN Zofran for c/o nausea earlier; tolerated all PO intake well.

## 2016-05-07 NOTE — Progress Notes (Signed)
D: Pt A & O X3. Presents with sullen affect and anxious mood. Reports fair sleep last night, poor appetite, poor concentration and poor energy level; "I just don't have energy to do anything, I feel like this detox protocol is not helping me, I'm still sweating, anxious and have shakes". Rates her depression, hopelessness and anxiety all 9/10 on self inventory sheet.  A: Scheduled and PRN medications administered as prescribed. Pt encouraged to continue current medications regimen, as she just started her protocol yesterday to monitor effectiveness. Support and availability provided to pt throughout this shift. Safety maintained on and off unit via Q 15 minutes checks.  R: Pt remains medication compliant. Denies adverse drug reactions at present. Tolerated all PO intake well post PRN Zofran for c/o nausea. POC maintained for safety and mood stability.

## 2016-05-08 DIAGNOSIS — R51 Headache: Secondary | ICD-10-CM

## 2016-05-08 DIAGNOSIS — R519 Headache, unspecified: Secondary | ICD-10-CM | POA: Clinically undetermined

## 2016-05-08 LAB — HEMOGLOBIN A1C
Hgb A1c MFr Bld: 5.4 % (ref 4.8–5.6)
Mean Plasma Glucose: 108 mg/dL

## 2016-05-08 LAB — PROLACTIN: Prolactin: 86.9 ng/mL — ABNORMAL HIGH (ref 4.8–23.3)

## 2016-05-08 MED ORDER — IBUPROFEN 800 MG PO TABS
800.0000 mg | ORAL_TABLET | Freq: Four times a day (QID) | ORAL | Status: DC | PRN
  Administered 2016-05-08 – 2016-05-13 (×6): 800 mg via ORAL
  Filled 2016-05-08 (×6): qty 1

## 2016-05-08 MED ORDER — NON FORMULARY: 2.0000 mg | Freq: Every day | Status: DC

## 2016-05-08 MED ORDER — ESZOPICLONE 2 MG PO TABS
2.0000 mg | ORAL_TABLET | Freq: Every day | ORAL | Status: DC
  Administered 2016-05-08: 2 mg via ORAL
  Filled 2016-05-08: qty 1

## 2016-05-08 MED ORDER — QUETIAPINE FUMARATE 50 MG PO TABS
50.0000 mg | ORAL_TABLET | Freq: Every day | ORAL | Status: DC
  Administered 2016-05-08: 50 mg via ORAL
  Filled 2016-05-08 (×2): qty 1

## 2016-05-08 MED ORDER — SUMATRIPTAN SUCCINATE 25 MG PO TABS
25.0000 mg | ORAL_TABLET | Freq: Once | ORAL | Status: AC
Start: 2016-05-08 — End: 2016-05-08
  Administered 2016-05-08: 25 mg via ORAL
  Filled 2016-05-08 (×3): qty 1

## 2016-05-08 NOTE — Progress Notes (Signed)
Recreation Therapy Notes  Date: 05/08/16 Time: 1000 Location: 500 Hall Dayroom  Group Topic: Leisure Education  Goal Area(s) Addresses:  Patient will identify positive leisure activities.  Patient will identify one positive benefit of participation in leisure activities.   Intervention: Various activities on pieces of paper, dry erase marker, dry erase board, eraser  Activity: Leisure Pictionary.  Patients were to pick an activity from the can.  Patients were to then draw a picture of the activity on the board. The remaining patients were to try and guess what the picture was.  The first person to guess the picture correctly would then get a chance to go next.  Education:  Leisure Education, Building control surveyorDischarge Planning  Education Outcome: Acknowledges education/In group clarification offered/Needs additional education  Clinical Observations/Feedback: Pt did not attend group.   Caroll RancherMarjette Stehanie Ekstrom, LRT/CTRS         [name], Khristopher Kapaun A 05/08/2016 11:58 AM

## 2016-05-08 NOTE — Progress Notes (Signed)
Nursing Note 05/08/2016 [id number]  Data Reports sleeping fair with/without PRN sleep med.  Rates depression 0/10, hopelessness 8/10, and anxiety 8/10. Affect downcast this AM.  Denies HI, SI, AVH.  C/O migraine this AM as well as foot pain.  Declines intervention (offered tylenol/naproxen), requesting Fioricet.  States it's tolerable enough to wait until she sees MD with blinds closed.  Says she would like to work on improving energy level and focus today. Reports ongoing anxiety.  Action Spoke with patient 1:1, nurse offered support to patient throughout shift.  Given PRN ativan x2 today for anxiety Continues to be monitored on 15 minute checks for safety.  Response Remains safe on unit.  Migraine resolved.  Patient reports having more energy.

## 2016-05-08 NOTE — Progress Notes (Signed)
[facility] MD Progress Note  05/08/2016 12:53 PM Megan Edwards  MRN:  [id number] Subjective:  Patient states " I am not sleeping, I have a headache . Lunesta worked in the past ."   Objective:Patient seen and chart reviewed.Discussed patient with treatment team.  Pt today is seen as withdrawn to bed , continues to be isolative , has sleep issues . Pt also with headache this AM, states that it her migraines acting up. Pt offered PRN pain medications . Pt advised to make an attempt to attend groups once her headache subsides . Pt per RN - continues to need a lot of encouragement and support.   Principal Problem: Bipolar I disorder, most recent episode manic, severe with psychotic features ([place]) Diagnosis:   Patient Active Problem List   Diagnosis Date Noted  . Head ache [R51] 05/08/2016  . Opioid use disorder, mild, in controlled environment [F11.10] 05/06/2016  . Essential hypertension [I10] 05/06/2016  . Alcohol use disorder, moderate, in early remission, dependence ([place]) [F10.21] 05/06/2016  . Bipolar I disorder, most recent episode manic, severe with psychotic features ([place]) [F31.2] 01/20/2016  . Chronic pain syndrome [G89.4]   . Morbid obesity due to excess calories ([place]) [E66.01]    Total Time spent with patient: 25 minutes  Past Psychiatric History: Please see H&P.   Past Medical History:  Past Medical History:  Diagnosis Date  . Abnormal Pap smear   . Bipolar 1 disorder ([place])   . Depression   . Genital herpes 2000  . Hypertension   . Infection    UTI  . Ovarian cyst   . Schizoaffective disorder ([place]) 05/05/2016  . SVT (supraventricular tachycardia) (H[facility])     Past Surgical History:  Procedure Laterality Date  . addenoidectomy    . BREAST ENHANCEMENT SURGERY    . BREAST SURGERY    . KNEE SURGERY    . KNEE SURGERY Left   . TONSILLECTOMY    . WISDOM TOOTH EXTRACTION     Family History:  Family History  Problem Relation Age of Onset  . Alcoholism    . Leukemia     . Cervical cancer    . Bone cancer    . Cirrhosis    . Diabetes Mellitus II    . Depression    . Hypertension    . Thyroid cancer    . Diabetes Mellitus II Mother   . Hypertension Mother   . Alcoholism Father   . Mental illness Paternal Grandmother    Family Psychiatric  History: Please see H&P.  Social History: Please see H&P.  History  Alcohol Use No    Comment: 4 40's beer daily- not currently     History  Drug Use    Comment: former    Social History   Social History  . Marital status: Divorced    Spouse name: N/A  . Number of children: N/A  . Years of education: N/A   Occupational History  . [employer]   Social History Main Topics  . Smoking status: Never Smoker  . Smokeless tobacco: Never Used  . Alcohol use No     Comment: 4 40's beer daily- not currently  . Drug use: Yes     Comment: former  . Sexual activity: Not Currently    Birth control/ protection: Abstinence   Other Topics Concern  . None   Social History Narrative  . None   Additional Social History:    Pain Medications: see MAR Prescriptions:  see MAR Over the Counter: see MAR History of alcohol / drug use?: Yes Name of Substance 1: Suboxone prescribed by [facility] 1 - Age of First Use: started 8 yrs ago  1 - Amount (size/oz): 16 mg's for the past 3 weeks 1 - Frequency: daily  1 - Duration: 8 years total; recently decreased dosage to 16mg 's 3 weeks ago 1 - Last Use / Amount: 05/02/2016                  Sleep: Poor  Appetite:  Poor  Current Medications: Current Facility-Administered Medications  Medication Dose Route Frequency Provider Last Rate Last Dose  . alum & mag hydroxide-simeth (MAALOX/MYLANTA) 200-200-20 MG/5ML suspension 30 mL  30 mL Oral Q4H PRN 10-01-2000, NP      . benztropine (COGENTIN) tablet 0.5 mg  0.5 mg Oral Q2000 Renatha Rosen, MD   0.5 mg at 05/07/16 2041  . cloNIDine (CATAPRES) tablet 0.1 mg  0.1 mg Oral BH-qamhs Nashae Maudlin, MD   0.1 mg at 05/08/16 0741   Followed by  . [START ON 05/10/2016] cloNIDine (CATAPRES) tablet 0.1 mg  0.1 mg Oral QAC breakfast Sherea Liptak, MD      . dicyclomine (BENTYL) tablet 20 mg  20 mg Oral Q6H PRN Elsie Sakuma, MD      . eszopiclone (LUNESTA) tablet 2 mg  2 mg Oral QHS Ellis Mehaffey, MD      . hydrOXYzine (ATARAX/VISTARIL) tablet 25 mg  25 mg Oral Q6H PRN 05/12/2016, MD   25 mg at 05/07/16 0942  . ibuprofen (ADVIL,MOTRIN) tablet 800 mg  800 mg Oral Q6H PRN 05/09/16, MD      . labetalol (NORMODYNE) tablet 100 mg  100 mg Oral QAC breakfast Edye Hainline, MD   100 mg at 05/08/16 0611  . labetalol (NORMODYNE) tablet 200 mg  200 mg Oral QPM Edwen Mclester, MD   200 mg at 05/07/16 1702  . lithium carbonate capsule 300 mg  300 mg Oral BID WC 05/09/16, MD   300 mg at 05/08/16 0742  . loperamide (IMODIUM) capsule 2-4 mg  2-4 mg Oral PRN 05/10/16, MD      . LORazepam (ATIVAN) tablet 1 mg  1 mg Oral Q6H PRN Jomarie Longs, MD   1 mg at 05/08/16 1102   Or  . LORazepam (ATIVAN) injection 1 mg  1 mg Intramuscular Q6H PRN 05/10/16, MD      . magnesium hydroxide (MILK OF MAGNESIA) suspension 30 mL  30 mL Oral Daily PRN Jomarie Longs, NP      . methocarbamol (ROBAXIN) tablet 500 mg  500 mg Oral Q8H PRN Freddye Cardamone, MD      . ondansetron (ZOFRAN-ODT) disintegrating tablet 4 mg  4 mg Oral Q6H PRN Adonis Brook, MD   4 mg at 05/07/16 1215  . paliperidone (INVEGA) 24 hr tablet 6 mg  6 mg Oral Q2000 05/09/16, MD   6 mg at 05/07/16 2041  . prazosin (MINIPRESS) capsule 1 mg  1 mg Oral QHS 2042, MD   1 mg at 05/07/16 2040  . QUEtiapine (SEROQUEL) tablet 100 mg  100 mg Oral TID PRN 2041, MD      . QUEtiapine (SEROQUEL) tablet 50 mg  50 mg Oral QHS Jomarie Longs, MD        Lab Results:  Results for orders placed or performed during the hospital encounter of 05/05/16 (from the past 48 hour(s))  CBC with Differential/Platelet     Status:  None   Collection Time: 05/07/16  6:34 AM  Result Value Ref Range   WBC 7.2 4.0 - 10.5 K/uL   RBC 4.43 3.87 - 5.11 MIL/uL   Hemoglobin 12.1 12.0 - 15.0 g/dL   HCT 36.8 36.0 - 46.0 %   MCV 83.1 78.0 - 100.0 fL   MCH 27.3 26.0 - 34.0 pg   MCHC 32.9 30.0 - 36.0 g/dL   RDW 13.9 11.5 - 15.5 %   Platelets 298 150 - 400 K/uL   Neutrophils Relative % 68 %   Neutro Abs 4.9 1.7 - 7.7 K/uL   Lymphocytes Relative 23 %   Lymphs Abs 1.7 0.7 - 4.0 K/uL   Monocytes Relative 5 %   Monocytes Absolute 0.4 0.1 - 1.0 K/uL   Eosinophils Relative 4 %   Eosinophils Absolute 0.3 0.0 - 0.7 K/uL   Basophils Relative 0 %   Basophils Absolute 0.0 0.0 - 0.1 K/uL    Comment: Performed at [facility], [place] [address], [place], [place] [address]  Comprehensive metabolic panel     Status: Abnormal   Collection Time: 05/07/16  6:34 AM  Result Value Ref Range   Sodium 140 135 - 145 mmol/L   Potassium 4.0 3.5 - 5.1 mmol/L   Chloride 101 101 - 111 mmol/L   CO2 30 22 - 32 mmol/L   Glucose, Bld 122 (H) 65 - 99 mg/dL   BUN 11 6 - 20 mg/dL   Creatinine, Ser 0.68 0.44 - 1.00 mg/dL   Calcium 9.2 8.9 - 10.3 mg/dL   Total Protein 7.0 6.5 - 8.1 g/dL   Albumin 3.9 3.5 - 5.0 g/dL   AST 18 15 - 41 U/L   ALT 36 14 - 54 U/L   Alkaline Phosphatase 74 38 - 126 U/L   Total Bilirubin 0.1 (L) 0.3 - 1.2 mg/dL   GFR calc non Af Amer >60 >60 mL/min   GFR calc Af Amer >60 >60 mL/min    Comment: (NOTE) The eGFR has been calculated using the CKD EPI equation. This calculation has not been validated in all clinical situations. eGFR's persistently <60 mL/min signify possible Chronic Kidney Disease.    Anion gap 9 5 - 15    Comment: Performed at [facility], [place] [address], [place], [place] [address]  Hemoglobin A1c     Status: None   Collection Time: 05/07/16  6:34 AM  Result Value Ref Range   Hgb A1c MFr Bld 5.4 4.8 - 5.6 %    Comment: (NOTE)         Pre-diabetes: 5.7 - 6.4          Diabetes: >6.4         Glycemic control for adults with diabetes: <7.0    Mean Plasma Glucose 108 mg/dL    Comment: (NOTE) Performed At: [facility] [place], [place] [id number] Lindon Romp MD [id number] Performed at [facility], [place] [address] [place], [place] [address]   Prolactin     Status: Abnormal   Collection Time: 05/07/16  6:34 AM  Result Value Ref Range   Prolactin 86.9 (H) 4.8 - 23.3 ng/mL    Comment: (NOTE) Performed At: [facility] [place], [place] [id number] Lindon Romp MD [id number] Performed at [facility], [place] [address], [place],  [address]     Blood Alcohol level:  Lab Results  Component Value Date   ETH <5 05/02/2016   ETH <5 78/46/9629    Metabolic Disorder Labs: Lab Results  Component Value Date   HGBA1C 5.4 05/07/2016   MPG 108 05/07/2016   MPG 108 05/06/2016   Lab Results  Component Value Date   PROLACTIN 86.9 (H) 05/07/2016   PROLACTIN 19.4 05/06/2016   Lab Results  Component Value Date   CHOL 161 05/06/2016   TRIG 167 (H) 05/06/2016   HDL 32 (L) 05/06/2016   CHOLHDL 5.0 05/06/2016   VLDL 33 05/06/2016   LDLCALC 96 05/06/2016   LDLCALC 108 (H) 01/22/2016    Physical Findings: AIMS: Facial and Oral Movements Muscles of Facial Expression: None, normal Lips and Perioral Area: None, normal Jaw: None, normal Tongue: None, normal,Extremity Movements Upper (arms, wrists, hands, fingers): None, normal Lower (legs, knees, ankles, toes): None, normal, Trunk Movements Neck, shoulders, hips: None, normal, Overall Severity Severity of abnormal movements (highest score from questions above): None, normal Incapacitation due to abnormal movements: None, normal Patient's awareness of abnormal movements (rate only patient's report): No Awareness, Dental Status Current problems with teeth and/or dentures?: No Does patient usually wear  dentures?: No  CIWA:  CIWA-Ar Total: 2 COWS:  COWS Total Score: 4  Musculoskeletal: Strength & Muscle Tone: within normal limits Gait & Station: seen in bed Patient leans: N/A  Psychiatric Specialty Exam: Physical Exam  Nursing note and vitals reviewed.   Review of Systems  Constitutional: Positive for malaise/fatigue.  Psychiatric/Behavioral: Positive for depression and substance abuse. The patient has insomnia.   All other systems reviewed and are negative.   Blood pressure (!) 135/99, pulse 90, temperature 98.6 F (37 C), temperature source Oral, resp. rate 16, height '5\' 6"'$  (1.676 m), weight 97.1 kg (214 lb), last menstrual period 03/09/2016, SpO2 99 %, not currently breastfeeding.Body mass index is 34.54 kg/m.  General Appearance: Guarded  Eye Contact:  Fair  Speech:  Slow  Volume:  Decreased  Mood:  Anxious and Dysphoric  Affect:  Depressed  Thought Process:  Goal Directed and Descriptions of Associations: Intact  Orientation:  Full (Time, Place, and Person)  Thought Content:  Paranoid Ideation and Rumination improving  Suicidal Thoughts:  No  Homicidal Thoughts:  No  Memory:  Immediate;   Fair Recent;   Fair Remote;   Fair  Judgement:  Fair  Insight:  Shallow  Psychomotor Activity:  Restlessness  Concentration:  Concentration: Fair and Attention Span: Fair  Recall:  [employer] of Knowledge:  Fair  Language:  Fair  Akathisia:  No  Handed:  Right  AIMS (if indicated):     Assets:  Desire for Improvement  ADL's:  Intact  Cognition:  WNL  Sleep:  Number of Hours: 6.5     Treatment Plan Summary:Patient today seen as anxious, depressed, continues to struggle with sleep issues , has a headache this AM , which is making her more distressed. Will offer PRN medications , continue to encourage and support.   Bipolar I disorder, most recent episode manic, severe with psychotic features ([place]) unstable  Will continue today 05/08/16  plan as below except where it is  noted.   Daily contact with patient to assess and evaluate symptoms and progress in treatment, Medication management and Plan see below For Bipolar disorder: Continue to cross taper Seroquel with Invega . Plan to discharge on LAI if she tolerates Invega.  Li 300 mg po bid for mood lability. Li level - 05/11/16.  For PTSD: Referral to CBT. Minipress 1 mg po qhs for nightmares.  For Insomnia: Discontinue Remeron for lack of efficacy. Discontinue Doxepin for lack of efficacy. Start Lunesta 2 mg po qhs , pt states it worked well in the past.   For opioid use do: Pt wants to be taken off of suboxone and she has not had it in 4 days prior to admission. COWS/clonidine protocol.  For alcohol use disorder in early remission: Continue substance abuse counseling.  For Headaches: Symptomatic treatment as per MAR.  For HTN: Increased Labetolol to 300 mg po daily in divided doses. Continue to monitor.  Will continue to monitor vitals ,medication compliance and treatment side effects while patient is here.   Will monitor for medical issues as well as call consult as needed.   Reviewed labs - hb- low, hct - low, calcium ionized - low , uds- pos for bzd,stimulants , tsh - wnl ,repeat CBC with diff- wnl ( 03/06/17) , repeat cmp- wnl  ,  hba1c- WNL , pl - elevated - will need to be monitored .  CSW will continue working on disposition.   Patient to participate in therapeutic milieu . Richard Ritchey, MD 05/08/2016, 12:53 PM

## 2016-05-08 NOTE — Progress Notes (Signed)
   D: Pt was pleasant, but sad. Greeted that Clinical research associatewriter and asked the Clinical research associatewriter questions. Informed the writer that she's been having difficulty finding a provider that wants to "deal with suboxone". When asked about the circumstances surrounding her adm pt chuckled, stating "girl, I don't know". Stated she "feels better and thinks by tomorrow or Fri she'll be ready to go".  Pt has no questions or concerns.   A:  Support and encouragement was offered. 15 min checks continued for safety.  R: Pt remains safe.

## 2016-05-08 NOTE — [facility] Group Notes (Signed)
[facility] LCSW Group Therapy  05/08/2016 2:43 PM   Type of Therapy:  Group Therapy   Participation Level:  Engaged  Participation Quality:  Attentive  Affect:  Appropriate   Cognitive:  Alert   Insight:  Engaged  Engagement in Therapy:  Improving   Modes of Intervention:  Education, Exploration, Socialization   Summary of Progress/Problems: Megan Edwards was engaged throughout, and stayed majority of the group session, but chose to leave 20 minutes before the end of group and never returned.   Megan Edwards from [employer] was here to tell his story of recovery, inform patients about [employer] and play his guitar.   Megan Edwards 05/08/2016 2:43 PM

## 2016-05-09 ENCOUNTER — Encounter (HOSPITAL_COMMUNITY): Payer: Self-pay

## 2016-05-09 MED ORDER — BENZTROPINE MESYLATE 1 MG PO TABS
1.0000 mg | ORAL_TABLET | Freq: Every day | ORAL | Status: DC
  Administered 2016-05-09 – 2016-05-12 (×4): 1 mg via ORAL
  Filled 2016-05-09 (×5): qty 1

## 2016-05-09 MED ORDER — ZOLPIDEM TARTRATE 5 MG PO TABS
5.0000 mg | ORAL_TABLET | Freq: Every day | ORAL | Status: DC
  Administered 2016-05-09: 5 mg via ORAL
  Filled 2016-05-09: qty 1

## 2016-05-09 MED ORDER — PALIPERIDONE ER 3 MG PO TB24
9.0000 mg | ORAL_TABLET | Freq: Every day | ORAL | Status: DC
  Administered 2016-05-09 – 2016-05-12 (×4): 9 mg via ORAL
  Filled 2016-05-09 (×5): qty 3

## 2016-05-09 MED ORDER — GABAPENTIN 100 MG PO CAPS
100.0000 mg | ORAL_CAPSULE | ORAL | Status: DC
  Administered 2016-05-09 – 2016-05-10 (×3): 100 mg via ORAL
  Filled 2016-05-09 (×6): qty 1

## 2016-05-09 NOTE — Progress Notes (Signed)
Nursing Progress Note 7p-7a  D) Patient presents anxious but cooperative with Clinical research associatewriter. Patient is observed interacting with peers in the dayroom. Patient attended group. Patient states "I am not usually up this late". Patient requests PM medications "as early as possible". Patient denies SI/HI/AVH or pain. Patient contracts for safety at this time. Patient inquires about her medication regimen.  A) Emotional support given. Patient medicated with PM orders as prescribed. Medications reviewed with patient. Patient verbalized understanding. Patient on q15 min safety checks. Opportunities for questions or concerns presented to patient. Patient encouraged to continue to work on treatment goals.  R) Patient receptive to interaction with nurse. Patient remains safe on the unit at this time. Patient is resting in bed without complaints. Will continue to monitor.

## 2016-05-09 NOTE — Plan of Care (Signed)
Problem: Activity: Goal: Interest or engagement in activities will improve Outcome: Progressing Patient attended group and was interacting with peers in the dayroom. Patient inquired about medication regimen.

## 2016-05-09 NOTE — [facility] Group Notes (Signed)
[facility] Group Notes:  (Counselor/Nursing/MHT/Case Management/Adjunct)  05/09/2016 1:15PM  Type of Therapy:  Group Therapy  Participation Level:  Active  Participation Quality:  Appropriate  Affect:  Flat  Cognitive:  Oriented  Insight:  Improving  Engagement in Group:  Limited  Engagement in Therapy:  Limited  Modes of Intervention:  Discussion, Exploration and Socialization  Summary of Progress/Problems: The topic for group was balance in life.  Pt participated in the discussion about when their life was in balance and out of balance and how this feels.  Pt discussed ways to get back in balance and short term goals they can work on to get where they want to be. "Today I am in balance, and I know this because I am able to assess how I am doing, and then plan accordingly.  Hpowever, I alwso feel unmotivated and a bit down, so I am not where I want to be yet.  I know I can be better than this."  Insightful.  Talked about using her therapist to look at scenarios and see how they will play out and what she needs to do about them-practicing ahead of time.  Admitted that in the past she has needed to live in a structured environ ment, which meant giving up some freedom, "but I knew that was what I needed at that point, or things were going to spiral out of control again."   [name], Emon Miggins B 05/09/2016 1:08 PM

## 2016-05-09 NOTE — Progress Notes (Signed)
[facility] MD Progress Note  2/15/2018[facility] 1:27 PM Megan Edwards  MRN:  [id number] Subjective:  Patient states " I still did not sleep on the Lunesta last night. I still feel tired and depressed.'    Objective:Patient seen and chart reviewed.Discussed patient with treatment team.  Pt today seen in bed, continues to be withdrawn , mostly isolative and depressed. Pt also with poor sleep last night , with no improvement on Lunesta. Pt continues to report headaches , but states she has it when she is anxious. Pt reports that being around people , like going to groups make her anxious and in turn gives her headaches. Pt offered new sleep medications - will start Ambien tonight and monitor sleep. Provided brief supportive therapy - discussed relaxation techniques. Continue to encourage and support.    Principal Problem: Bipolar I disorder, most recent episode manic, severe with psychotic features (HCC) Diagnosis:   Patient Active Problem List   Diagnosis Date Noted  . Head ache [R51] 05/08/2016  . Opioid use disorder, mild, in controlled environment [F11.10] 05/06/2016  . Essential hypertension [I10] 05/06/2016  . Alcohol use disorder, moderate, in early remission, dependence (HCC) [F10.21] 05/06/2016  . Bipolar I disorder, most recent episode manic, severe with psychotic features (HCC) [F31.2] 01/20/2016  . Chronic pain syndrome [G89.4]   . Morbid obesity due to excess calories (HCC) [E66.01]    Total Time spent with patient: 25 minutes  Past Psychiatric History: Please see H&P.   Past Medical History:  Past Medical History:  Diagnosis Date  . Abnormal Pap smear   . Bipolar 1 disorder (HCC)   . Depression   . Genital herpes 2000  . Hypertension   . Infection    UTI  . Ovarian cyst   . Schizoaffective disorder (HCC) 05/05/2016  . SVT (supraventricular tachycardia) (HCC)     Past Surgical History:  Procedure Laterality Date  . addenoidectomy    . BREAST ENHANCEMENT SURGERY    .  BREAST SURGERY    . KNEE SURGERY    . KNEE SURGERY Left   . TONSILLECTOMY    . WISDOM TOOTH EXTRACTION     Family History:  Family History  Problem Relation Age of Onset  . Alcoholism    . Leukemia    . Cervical cancer    . Bone cancer    . Cirrhosis    . Diabetes Mellitus II    . Depression    . Hypertension    . Thyroid cancer    . Diabetes Mellitus II Mother   . Hypertension Mother   . Alcoholism Father   . Mental illness Paternal Grandmother    Family Psychiatric  History: Please see H&P.  Social History: Please see H&P.  History  Alcohol Use No    Comment: 4 40's beer daily- not currently     History  Drug Use    Comment: former    Social History   Social History  . Marital status: Divorced    Spouse name: N/A  . Number of children: N/A  . 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 Smoking status: Never Smoker  . Smokeless tobacco: Never Used  . Alcohol use No     Comment: 4 40's beer daily- not currently  . Drug use: Yes     Comment: former  . Sexual activity: Not Currently    Birth control/ protection: Abstinence  Other Topics Concern  . None   Social History Narrative  . None   Additional Social History:    Pain Medications: see MAR Prescriptions: see MAR Over the Counter: see MAR History of alcohol / drug use?: Yes Name of Substance 1: Suboxone prescribed by Step by Step program 1 - Age of First Use: started 8 yrs ago  1 - Amount (size/oz): 16 mg's for the past 3 weeks 1 - Frequency: daily  1 - Duration: 8 years total; recently decreased dosage to 16mg 's 3 weeks ago 1 - Last Use / Amount: 05/02/2016                  Sleep: Poor  Appetite:  Poor  Current Medications: Current Facility-Administered Medications  Medication Dose Route Frequency Provider Last Rate Last Dose  . alum & mag hydroxide-simeth (MAALOX/MYLANTA) 200-200-20 MG/5ML suspension 30 mL  30 mL  Oral Q4H PRN Adonis Brook, NP      . benztropine (COGENTIN) tablet 1 mg  1 mg Oral QHS Phenix Vandermeulen, MD      . cloNIDine (CATAPRES) tablet 0.1 mg  0.1 mg Oral BH-qamhs Doyal Saric, MD   0.1 mg at 05/09/16 0750   Followed by  . [START ON 05/10/2016] cloNIDine (CATAPRES) tablet 0.1 mg  0.1 mg Oral QAC breakfast Wesam Gearhart, MD      . dicyclomine (BENTYL) tablet 20 mg  20 mg Oral Q6H PRN Anokhi Shannon, MD      . gabapentin (NEURONTIN) capsule 100 mg  100 mg Oral [id number] Anai Lipson, MD      . hydrOXYzine (ATARAX/VISTARIL) tablet 25 mg  25 mg Oral Q6H PRN Jomarie Longs, MD   25 mg at 05/09/16 0750  . ibuprofen (ADVIL,MOTRIN) tablet 800 mg  800 mg Oral Q6H PRN Jomarie Longs, MD   800 mg at 05/09/16 0750  . labetalol (NORMODYNE) tablet 100 mg  100 mg Oral QAC breakfast Jomarie Longs, MD   100 mg at 05/09/16 0615  . labetalol (NORMODYNE) tablet 200 mg  200 mg Oral QPM Clorissa Gruenberg, MD   200 mg at 05/08/16 1703  . lithium carbonate capsule 300 mg  300 mg Oral BID WC Jomarie Longs, MD   300 mg at 05/09/16 0750  . loperamide (IMODIUM) capsule 2-4 mg  2-4 mg Oral PRN Jomarie Longs, MD      . LORazepam (ATIVAN) tablet 1 mg  1 mg Oral Q6H PRN Jomarie Longs, MD   1 mg at 05/09/16 1610   Or  . LORazepam (ATIVAN) injection 1 mg  1 mg Intramuscular Q6H PRN Jomarie Longs, MD      . magnesium hydroxide (MILK OF MAGNESIA) suspension 30 mL  30 mL Oral Daily PRN Adonis Brook, NP      . methocarbamol (ROBAXIN) tablet 500 mg  500 mg Oral Q8H PRN Farren Landa, MD      . ondansetron (ZOFRAN-ODT) disintegrating tablet 4 mg  4 mg Oral Q6H PRN Jomarie Longs, MD   4 mg at 05/07/16 1215  . paliperidone (INVEGA) 24 hr tablet 9 mg  9 mg Oral QHS Trennon Torbeck, MD      . prazosin (MINIPRESS) capsule 1 mg  1 mg Oral QHS Jomarie Longs, MD   1 mg at 05/08/16 2128  . QUEtiapine (SEROQUEL) tablet 100 mg  100 mg Oral TID PRN Jomarie Longs, MD      . zolpidem (AMBIEN) tablet 5 mg  5 mg Oral QHS Jomarie Longs, MD  Lab Results:  No results found for this or any previous visit (from the past 48 hour(s)).  Blood Alcohol level:  Lab Results  Component Value Date   ETH <5 05/02/2016   ETH <5 04/16/2016    Metabolic Disorder Labs: Lab Results  Component Value Date   HGBA1C 5.4 05/07/2016   MPG 108 05/07/2016   MPG 108 05/06/2016   Lab Results  Component Value Date   PROLACTIN 86.9 (H) 05/07/2016   PROLACTIN 19.4 05/06/2016   Lab Results  Component Value Date   CHOL 161 05/06/2016   TRIG 167 (H) 05/06/2016   HDL 32 (L) 05/06/2016   CHOLHDL 5.0 05/06/2016   VLDL 33 05/06/2016   LDLCALC 96 05/06/2016   LDLCALC 108 (H) 01/22/2016    Physical Findings: AIMS: Facial and Oral Movements Muscles of Facial Expression: None, normal Lips and Perioral Area: None, normal Jaw: None, normal Tongue: None, normal,Extremity Movements Upper (arms, wrists, hands, fingers): None, normal Lower (legs, knees, ankles, toes): None, normal, Trunk Movements Neck, shoulders, hips: None, normal, Overall Severity Severity of abnormal movements (highest score from questions above): None, normal Incapacitation due to abnormal movements: None, normal Patient's awareness of abnormal movements (rate only patient's report): No Awareness, Dental Status Current problems with teeth and/or dentures?: No Does patient usually wear dentures?: No  CIWA:  CIWA-Ar Total: 2 COWS:  COWS Total Score: 3  Musculoskeletal: Strength & Muscle Tone: within normal limits Gait & Station: seen in bed Patient leans: N/A  Psychiatric Specialty Exam: Physical Exam  Nursing note and vitals reviewed.   Review of Systems  Constitutional: Positive for malaise/fatigue.  Psychiatric/Behavioral: Positive for depression and substance abuse. The patient has insomnia.   All other systems reviewed and are negative.   Blood pressure 124/64, pulse 81, temperature 98.6 F (37 C), temperature source Oral, resp. rate 16,  height 5\' 6"  (1.676 m), weight 97.1 kg (214 lb), last menstrual period 03/09/2016, SpO2 99 %, not currently breastfeeding.Body mass index is 34.54 kg/m.  General Appearance: Guarded  Eye Contact:  Fair  Speech:  Slow  Volume:  Decreased  Mood:  Anxious, Depressed and Dysphoric  Affect:  Depressed  Thought Process:  Goal Directed and Descriptions of Associations: Intact  Orientation:  Full (Time, Place, and Person)  Thought Content:  Paranoid Ideation and Rumination improving  Suicidal Thoughts:  No  Homicidal Thoughts:  No  Memory:  Immediate;   Fair Recent;   Fair Remote;   Fair  Judgement:  Fair  Insight:  Lacking  Psychomotor Activity:  Restlessness  Concentration:  Concentration: Fair and Attention Span: Fair  Recall:  [employer] of Knowledge:  Fair  Language:  Fair  Akathisia:  No  Handed:  Right  AIMS (if indicated):     Assets:  Desire for Improvement  ADL's:  Intact  Cognition:  WNL  Sleep:  Number of Hours: 6.75     Treatment Plan Summary:Patient today seen as anxious, dysphoric , mostly due to lack of sleep last night , Lunesta did not work , hence will readjust her medications - continue to encourage and support.   Bipolar I disorder, most recent episode manic, severe with psychotic features (HCC) unstable  Will continue today 05/09/16 plan as below except where it is noted.   Daily contact with patient to assess and evaluate symptoms and progress in treatment, Medication management and Plan see below For Bipolar disorder: Reviewed - will continue to cross taper as discussed - cross taper Seroquel with  Invega .  Will Discontinue Seroquel, increase Invega ER to 9 mg po qhs .Plan to discharge on LAI if she tolerates Invega.  will continue Li 300 mg po bid for mood lability. Li level - 05/11/16.  For PTSD: Referral to CBT. Add Gabapentin 100 mg po tid for anxiety . Reassessed - continue Minipress 1 mg po qhs for nightmares.  For Insomnia: Discontinue  Remeron for lack of efficacy. Discontinue Doxepin for lack of efficacy. Discontinue Lunesta for lack of efficacy - start Ambien 5 mg po qhs for sleep.   For opioid use do: Pt wants to be taken off of suboxone and she has not had it in 4 days prior to admission. Reviewed - continue today - COWS/clonidine protocol.  For alcohol use disorder in early remission: Continue substance abuse counseling.  For Headaches: Discussed relaxation techniques /overcoming anxiety sx. Symptomatic treatment as per MAR.  For HTN: Her BP today stable - will continue - Labetolol  300 mg po daily in divided doses Continue to monitor.  Will continue to monitor vitals ,medication compliance and treatment side effects while patient is here.   Will monitor for medical issues as well as call consult as needed.   Reviewed labs - hb- low, hct - low, calcium ionized - low , uds- pos for bzd,stimulants , tsh - wnl ,repeat CBC with diff- wnl ( 03/06/17) , repeat cmp- wnl  ,  hba1c- WNL , pl - elevated - will need to be monitored .  CSW will continue working on disposition.   Patient to participate in therapeutic milieu . Trula Frede, MD 05/09/2016, 1:27 PM

## 2016-05-09 NOTE — Progress Notes (Signed)
Recreation Therapy Notes  Date: 05/09/16 Time: 1000 Location: 500 Hall Dayroom  Group Topic: Coping Skills  Goal Area(s) Addresses:  Patient will be able to identify positive coping skills. Patient will be able to identify benefits of using coping skills post d/c.  Behavioral Response: Engaged  Intervention: Magazines, scissors, glue sticks, worksheets, Holiday representativeconstruction paper  Activity:  PharmacologistCoping Skills.  Patients were given a worksheet divided into five sections (diversions, social, cognitive, tension releasers and physical).  Patients were to locate coping skills for each area in the magazines provided.  Patients were to then glue the coping skill to the corresponding areas.  Education:Coping Skills, Discharge Planning.   Education Outcome: Acknowledges understanding/In group clarification offered/Needs additional education.   Clinical Observations/Feedback: Pt identified her coping skills as: diversions- beach, shopping, watching kids play; social- camping, snorkeling; cognitive- yoga; tension releasers- get hair done, go to the spa and physical- working out.  Pt stated using her coping skills will allow her "get back to doing things I used to do."   Aura Bibby Lillia AbedLindsay, LRT/CTRS        [name], Addilee Neu A 05/09/2016 12:33 PM

## 2016-05-09 NOTE — Progress Notes (Signed)
DAR NOTE: Patient presents with anxious affect and depressed mood.  Denies pain, auditory and visual hallucinations.  Described energy level as low and concentration as poor.  Rates depression at 6, hopelessness at 6, and anxiety at 7.  Maintained on routine safety checks.  Medications given as prescribed.  Support and encouragement offered as needed.  Attended group and participated. Patient observed socializing with peers in the dayroom.  Offered no complaint.

## 2016-05-10 DIAGNOSIS — F988 Other specified behavioral and emotional disorders with onset usually occurring in childhood and adolescence: Secondary | ICD-10-CM | POA: Diagnosis present

## 2016-05-10 MED ORDER — PRAZOSIN HCL 2 MG PO CAPS
2.0000 mg | ORAL_CAPSULE | Freq: Every day | ORAL | Status: DC
  Administered 2016-05-10 – 2016-05-12 (×3): 2 mg via ORAL
  Filled 2016-05-10 (×4): qty 1

## 2016-05-10 MED ORDER — GABAPENTIN 100 MG PO CAPS
200.0000 mg | ORAL_CAPSULE | ORAL | Status: DC
  Administered 2016-05-10 – 2016-05-13 (×9): 200 mg via ORAL
  Filled 2016-05-10 (×12): qty 2

## 2016-05-10 MED ORDER — ATOMOXETINE HCL 40 MG PO CAPS
40.0000 mg | ORAL_CAPSULE | Freq: Every day | ORAL | Status: DC
  Administered 2016-05-10 – 2016-05-13 (×4): 40 mg via ORAL
  Filled 2016-05-10 (×6): qty 1

## 2016-05-10 MED ORDER — ZOLPIDEM TARTRATE 10 MG PO TABS
10.0000 mg | ORAL_TABLET | Freq: Every day | ORAL | Status: DC
  Administered 2016-05-10 – 2016-05-12 (×3): 10 mg via ORAL
  Filled 2016-05-10 (×3): qty 1

## 2016-05-10 NOTE — Progress Notes (Addendum)
[facility]  05/10/2016 12:06 PM Megan Edwards  MRN:  [id number] Subjective:  Patient states " I still have problems with concentration . I have been trying to work on some goals for myself, but inorder to function better , I need to be able to better concentrate. I have ADD and I was on Adderall , I do not want to be on Adderall, but I want something else.I am still not sleeping, I will try to stay awake during the day and see if that will help.'      Objective:Patient seen and chart reviewed.Discussed patient with treatment team.  Pt today seen as anxious , however seems to be improving . Pt continues to struggle with sleep , failed several trials of medications since admission. Discussed increasing Ambien - she agrees. Pt to be started on Strattera for ADD sx, taken off adderall this admission. Pt continues to need constant redirection on the unit. Pt continues to have withdrawal sx from suboxone withdrawal - continues to be on clonidine protocol.     Principal Problem: Bipolar I disorder, most recent episode manic, severe with psychotic features (HCC) Diagnosis:   Patient Active Problem List   Diagnosis Date Noted  . ADD (attention deficit disorder) [F98.8] 05/10/2016  . Head ache [R51] 05/08/2016  . Opioid use disorder, mild, in controlled environment [F11.10] 05/06/2016  . Essential hypertension [I10] 05/06/2016  . Alcohol use disorder, moderate, in early remission, dependence (HCC) [F10.21] 05/06/2016  . Bipolar I disorder, most recent episode manic, severe with psychotic features (HCC) [F31.2] 01/20/2016  . Chronic pain syndrome [G89.4]   . Morbid obesity due to excess calories (HCC) [E66.01]    Total Time spent with patient: 25 minutes  Past Psychiatric History: Please see H&P.   Past Medical History:  Past Medical History:  Diagnosis Date  . Abnormal Pap smear   . Bipolar 1 disorder (HCC)   . Depression   . Genital herpes 2000  . Hypertension   .  Infection    UTI  . Ovarian cyst   . Schizoaffective disorder (HCC) 05/05/2016  . SVT (supraventricular tachycardia) (HCC)     Past Surgical History:  Procedure Laterality Date  . addenoidectomy    . BREAST ENHANCEMENT SURGERY    . BREAST SURGERY    . KNEE SURGERY    . KNEE SURGERY Left   . TONSILLECTOMY    . WISDOM TOOTH EXTRACTION     Family History:  Family History  Problem Relation Age of Onset  . Alcoholism    . Leukemia    . Cervical cancer    . Bone cancer    . Cirrhosis    . Diabetes Mellitus II    . Depression    . Hypertension    . Thyroid cancer    . Diabetes Mellitus II Mother   . Hypertension Mother   . Alcoholism Father   . Mental illness Paternal Grandmother    Family Psychiatric  History: Please see H&P.  Social History: Please see H&P.  History  Alcohol Use No    Comment: 4 40's beer daily- not currently     History  Drug Use    Comment: former    Social History   Social History  . Marital status: Divorced    Spouse name: N/A  . Number of children: N/A  . 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 Smoking  status: Never Smoker  . Smokeless tobacco: Never Used  . Alcohol use No     Comment: 4 40's beer daily- not currently  . Drug use: Yes     Comment: former  . Sexual activity: Not Currently    Birth control/ protection: Abstinence   Other Topics Concern  . None   Social History Narrative  . None   Additional Social History:    Pain Medications: see MAR Prescriptions: see MAR Over the Counter: see MAR History of alcohol / drug use?: Yes Name of Substance 1: Suboxone prescribed by Step by Step program 1 - Age of First Use: started 8 yrs ago  1 - Amount (size/oz): 16 mg's for the past 3 weeks 1 - Frequency: daily  1 - Duration: 8 years total; recently decreased dosage to 16mg 's 3 weeks ago 1 - Last Use / Amount: 05/02/2016                  Sleep:  restless  Appetite:  Fair  Current Medications: Current Facility-Administered Medications  Medication Dose Route Frequency Provider Last Rate Last Dose  . alum & mag hydroxide-simeth (MAALOX/MYLANTA) 200-200-20 MG/5ML suspension 30 mL  30 mL Oral Q4H PRN Adonis Brook, NP      . atomoxetine (STRATTERA) capsule 40 mg  40 mg Oral Daily Bretta Fees, MD      . benztropine (COGENTIN) tablet 1 mg  1 mg Oral QHS Jomarie Longs, MD   1 mg at 05/09/16 2127  . cloNIDine (CATAPRES) tablet 0.1 mg  0.1 mg Oral QAC breakfast Jomarie Longs, MD   0.1 mg at 05/10/16 0809  . dicyclomine (BENTYL) tablet 20 mg  20 mg Oral Q6H PRN Chrissa Meetze, MD      . gabapentin (NEURONTIN) capsule 200 mg  200 mg Oral [id number] Daisean Brodhead, MD      . hydrOXYzine (ATARAX/VISTARIL) tablet 25 mg  25 mg Oral Q6H PRN Jomarie Longs, MD   25 mg at 05/09/16 0750  . ibuprofen (ADVIL,MOTRIN) tablet 800 mg  800 mg Oral Q6H PRN Jomarie Longs, MD   800 mg at 05/09/16 0750  . labetalol (NORMODYNE) tablet 100 mg  100 mg Oral QAC breakfast Jomarie Longs, MD   100 mg at 05/10/16 1610  . labetalol (NORMODYNE) tablet 200 mg  200 mg Oral QPM Hayslee Casebolt, MD   200 mg at 05/09/16 1658  . lithium carbonate capsule 300 mg  300 mg Oral BID WC Jomarie Longs, MD   300 mg at 05/10/16 0809  . loperamide (IMODIUM) capsule 2-4 mg  2-4 mg Oral PRN Jomarie Longs, MD      . magnesium hydroxide (MILK OF MAGNESIA) suspension 30 mL  30 mL Oral Daily PRN Adonis Brook, NP      . methocarbamol (ROBAXIN) tablet 500 mg  500 mg Oral Q8H PRN Edu On, MD      . ondansetron (ZOFRAN-ODT) disintegrating tablet 4 mg  4 mg Oral Q6H PRN Jomarie Longs, MD   4 mg at 05/07/16 1215  . paliperidone (INVEGA) 24 hr tablet 9 mg  9 mg Oral QHS Jomarie Longs, MD   9 mg at 05/09/16 2127  . prazosin (MINIPRESS) capsule 2 mg  2 mg Oral QHS Laporsche Hoeger, MD      . QUEtiapine (SEROQUEL) tablet 100 mg  100 mg Oral TID PRN Jomarie Longs, MD   100 mg at 05/09/16  2340  . zolpidem (AMBIEN) tablet 10 mg  10 mg Oral QHS Challen Spainhour,  MD        Lab Results:  No results found for this or any previous visit (from the past 48 hour(s)).  Blood Alcohol level:  Lab Results  Component Value Date   ETH <5 05/02/2016   ETH <5 04/16/2016    Metabolic Disorder Labs: Lab Results  Component Value Date   HGBA1C 5.4 05/07/2016   MPG 108 05/07/2016   MPG 108 05/06/2016   Lab Results  Component Value Date   PROLACTIN 86.9 (H) 05/07/2016   PROLACTIN 19.4 05/06/2016   Lab Results  Component Value Date   CHOL 161 05/06/2016   TRIG 167 (H) 05/06/2016   HDL 32 (L) 05/06/2016   CHOLHDL 5.0 05/06/2016   VLDL 33 05/06/2016   LDLCALC 96 05/06/2016   LDLCALC 108 (H) 01/22/2016    Physical Findings: AIMS: Facial and Oral Movements Muscles of Facial Expression: None, normal Lips and Perioral Area: None, normal Jaw: None, normal Tongue: None, normal,Extremity Movements Upper (arms, wrists, hands, fingers): None, normal Lower (legs, knees, ankles, toes): None, normal, Trunk Movements Neck, shoulders, hips: None, normal, Overall Severity Severity of abnormal movements (highest score from questions above): None, normal Incapacitation due to abnormal movements: None, normal Patient's awareness of abnormal movements (rate only patient's report): No Awareness, Dental Status Current problems with teeth and/or dentures?: No Does patient usually wear dentures?: No  CIWA:  CIWA-Ar Total: 1 COWS:  COWS Total Score: 1  Musculoskeletal: Strength & Muscle Tone: within normal limits Gait & Station: normal Patient leans: N/A  Psychiatric Specialty Exam: Physical Exam  Nursing note and vitals reviewed.   Review of Systems  Constitutional: Positive for diaphoresis and malaise/fatigue.  Psychiatric/Behavioral: Positive for depression and substance abuse. The patient is nervous/anxious and has insomnia.   All other systems reviewed and are negative.   Blood  pressure 118/85, pulse (!) 102, temperature 97.6 F (36.4 C), temperature source Oral, resp. rate 16, height 5\' 6"  (1.676 m), weight 97.1 kg (214 lb), last menstrual period 03/09/2016, SpO2 99 %, not currently breastfeeding.Body mass index is 34.54 kg/m.  General Appearance: Guarded  Eye Contact:  Fair  Speech:  Pressured  Volume:  Normal  Mood:  Anxious and Depressed  Affect:  Congruent  Thought Process:  Goal Directed and Descriptions of Associations: Circumstantial  Orientation:  Full (Time, Place, and Person)  Thought Content:  Paranoid Ideation and Rumination improving  Suicidal Thoughts:  No  Homicidal Thoughts:  No  Memory:  Immediate;   Fair Recent;   Fair Remote;   Fair  Judgement:  Fair  Insight:  Fair  Psychomotor Activity:  Restlessness  Concentration:  Concentration: Poor and Attention Span: Poor  Recall:  [employer] of Knowledge:  Fair  Language:  Fair  Akathisia:  No  Handed:  Right  AIMS (if indicated):     Assets:  Desire for Improvement  ADL's:  Intact  Cognition:  WNL  Sleep:  Number of Hours: 5.25     Treatment Plan Summary:Patient seen today as pressured , anxious , although improving , her concentration/attention continues to be limited , her sleep is unstable. Continue to make readjustments with medications.  Bipolar I disorder, most recent episode manic, severe with psychotic features (HCC) unstable - improving  Will continue today 05/10/16 plan as below except where it is noted.   Daily contact with patient to assess and evaluate symptoms and progress in treatment, Medication management and Plan please see below For Bipolar disorder: Seroquel discontinued for lack of  efficacy , continue Invega ER 9 mg po qhs - psychosis/mood sx.  Plan to discharge on LAI if she tolerates Invega.  Will continue Li as scheduled - continue Li 300 mg po bid for mood lability. Order Li level for 05/11/16.  For PTSD: Referral to CBT. Will increase gabapentin to  200 mg po tid for anxiety/restlessness. Increase Minipress to 2 mg po qhs for nightmares.   For Insomnia: Discontinue Remeron for lack of efficacy. Discontinue Doxepin for lack of efficacy. Discontinue Lunesta for lack of efficacy. Increase Ambien to 10 mg po qhs for insomnia.  For ADD: Discontinued Adderall. Start Strattera 40 mg po daily - LFTs reviewed - wnl on admission.  For opioid use do: Pt wants to be taken off of suboxone and she has not had it in 4 days prior to admission. Reviewed today  - COWS/clonidine protocol.  For alcohol use disorder in early remission: Continue substance abuse counseling.  For Headaches: Discussed relaxation techniques /overcoming anxiety sx. Symptomatic treatment as per MAR.  For HTN: Her BP today stable - will continue - Labetolol  300 mg po daily in divided doses Continue to monitor.  Will continue to monitor vitals ,medication compliance and treatment side effects while patient is here.   Will monitor for medical issues as well as call consult as needed.   Reviewed labs - hb- low, hct - low, calcium ionized - low , uds- pos for bzd,stimulants , tsh - wnl ,repeat CBC with diff- wnl ( 03/06/17) , repeat cmp- wnl  ,  hba1c- WNL , pl - elevated - will need to be monitored .  CSW will continue working on disposition.   Patient to participate in therapeutic milieu . Jameel Quant, MD 05/10/2016, 12:06 PM

## 2016-05-10 NOTE — Progress Notes (Signed)
Nursing Progress Note 7p-7a  D) Patient presents pleasant and cooperative. Patient is observed interacting with peers in the day room. Patient states "I didn't sleep well last night, I don't want that to happen again". Patient requests medications be brought to her in bed. Patient encouraged to take medications at the medication window. Vital signs obtained and WNL. Patient denies SI/HI/AVH or pain. Patient contracts for safety at this time.  A) Emotional support given. 1:1 interaction and active listening provided. Patient medicated with PM orders as prescribed. Medications reviewed with patient. Patient verbalized understanding of medications without further questions.  Snacks and fluids provided. Opportunities for questions or concerns presented to patient. Patient encouraged to continue to work on treatment goals. Labs, vital signs and patient behavior monitored throughout shift. Patient safety maintained with q15 min safety checks.  R) Patient receptive to interaction with nurse. Patient remains safe on the unit at this time. Patient denies any adverse medication reactions at this time. Patient is resting in bed without complaints. Will continue to monitor.

## 2016-05-10 NOTE — Tx Team (Signed)
Interdisciplinary Treatment and Diagnostic Plan Update  05/10/2016 Time of Session: 9:30am Myrla HalstedKelly L Spegal MRN: [id number]  Principal Diagnosis: Bipolar I disorder, most recent episode manic, severe with psychotic features (HCC)  Secondary Diagnoses: Principal Problem:   Bipolar I disorder, most recent episode manic, severe with psychotic features (HCC) Active Problems:   Chronic pain syndrome   Opioid use disorder, mild, in controlled environment   Essential hypertension   Alcohol use disorder, moderate, in early remission, dependence (HCC)   Head ache   ADD (attention deficit disorder)   Current Medications:  Current Facility-Administered Medications  Medication Dose Route Frequency Provider Last Rate Last Dose  . alum & mag hydroxide-simeth (MAALOX/MYLANTA) 200-200-20 MG/5ML suspension 30 mL  30 mL Oral Q4H PRN Adonis BrookSheila Agustin, NP      . atomoxetine (STRATTERA) capsule 40 mg  40 mg Oral Daily Saramma Eappen, MD      . benztropine (COGENTIN) tablet 1 mg  1 mg Oral QHS Jomarie LongsSaramma Eappen, MD   1 mg at 05/09/16 2127  . cloNIDine (CATAPRES) tablet 0.1 mg  0.1 mg Oral QAC breakfast Jomarie LongsSaramma Eappen, MD   0.1 mg at 05/10/16 0809  . dicyclomine (BENTYL) tablet 20 mg  20 mg Oral Q6H PRN Saramma Eappen, MD      . gabapentin (NEURONTIN) capsule 200 mg  200 mg Oral [id number] Saramma Eappen, MD      . hydrOXYzine (ATARAX/VISTARIL) tablet 25 mg  25 mg Oral Q6H PRN Jomarie LongsSaramma Eappen, MD   25 mg at 05/09/16 0750  . ibuprofen (ADVIL,MOTRIN) tablet 800 mg  800 mg Oral Q6H PRN Jomarie LongsSaramma Eappen, MD   800 mg at 05/09/16 0750  . labetalol (NORMODYNE) tablet 100 mg  100 mg Oral QAC breakfast Jomarie LongsSaramma Eappen, MD   100 mg at 05/10/16 [id number]  . labetalol (NORMODYNE) tablet 200 mg  200 mg Oral QPM Saramma Eappen, MD   200 mg at 05/09/16 1658  . lithium carbonate capsule 300 mg  300 mg Oral BID WC Jomarie LongsSaramma Eappen, MD   300 mg at 05/10/16 0809  . loperamide (IMODIUM) capsule 2-4 mg  2-4 mg Oral PRN Jomarie LongsSaramma Eappen, MD       . magnesium hydroxide (MILK OF MAGNESIA) suspension 30 mL  30 mL Oral Daily PRN Adonis BrookSheila Agustin, NP      . methocarbamol (ROBAXIN) tablet 500 mg  500 mg Oral Q8H PRN Saramma Eappen, MD      . ondansetron (ZOFRAN-ODT) disintegrating tablet 4 mg  4 mg Oral Q6H PRN Jomarie LongsSaramma Eappen, MD   4 mg at 05/07/16 1215  . paliperidone (INVEGA) 24 hr tablet 9 mg  9 mg Oral QHS Jomarie LongsSaramma Eappen, MD   9 mg at 05/09/16 2127  . prazosin (MINIPRESS) capsule 2 mg  2 mg Oral QHS Saramma Eappen, MD      . QUEtiapine (SEROQUEL) tablet 100 mg  100 mg Oral TID PRN Jomarie LongsSaramma Eappen, MD   100 mg at 05/09/16 2340  . zolpidem (AMBIEN) tablet 10 mg  10 mg Oral QHS Jomarie LongsSaramma Eappen, MD        PTA Medications: Prescriptions Prior to Admission  Medication Sig Dispense Refill Last Dose  . ADDERALL XR 30 MG 24 hr capsule Take 30 mg by mouth 2 (two) times daily.  0 05/03/2016 at am  . BIOTIN PO Take 1 tablet by mouth every morning.   05/02/2016 at am  . famotidine (PEPCID AC) 10 MG chewable tablet Chew 10 mg by mouth See admin instructions. Two to three times  a day before meals   Past Week at Unknown time  . labetalol (NORMODYNE) 100 MG tablet Take 1 tablet (100 mg total) by mouth 2 (two) times daily. (Patient taking differently: Take 100 mg by mouth 3 (three) times daily. ) 60 tablet 1 05/01/2016 at pm  . promethazine (PHENERGAN) 6.25 MG/5ML syrup Take 20 mLs (25 mg total) by mouth every 6 (six) hours as needed for nausea or vomiting. 120 mL 0 Past Month at Unknown time  . QUEtiapine (SEROQUEL) 200 MG tablet Take 1 tablet (200 mg total) by mouth 3 (three) times daily. 90 tablet 1 05/02/2016 at am  . SUBOXONE 8-2 MG FILM Place 1 Film under the tongue 2 (two) times daily.   0 05/02/2016 at Unknown time  . traZODone (DESYREL) 150 MG tablet Take 300 mg by mouth at bedtime as needed for sleep.   Past Week at Unknown time    Treatment Modalities: Medication Management, Group therapy, Case management,  1 to 1 session with clinician, Psychoeducation,  Recreational therapy.  Patient Stressors: Paediatric nurse issue Marital or family conflict Substance abuse  Patient Strengths: Capable of independent living Motivation for treatment/growth Religious Affiliation  Physician Treatment Plan for Primary Diagnosis: Bipolar I disorder, most recent episode manic, severe with psychotic features (HCC) Long Term Goal(s): Improvement in symptoms so as ready for discharge  Short Term Goals: Compliance with prescribed medications will improve  Medication Management: Evaluate patient's response, side effects, and tolerance of medication regimen.  Therapeutic Interventions: 1 to 1 sessions, Unit Group sessions and Medication administration.  Evaluation of Outcomes: Progressing  Physician Treatment Plan for Secondary Diagnosis: Principal Problem:   Bipolar I disorder, most recent episode manic, severe with psychotic features (HCC) Active Problems:   Chronic pain syndrome   Opioid use disorder, mild, in controlled environment   Essential hypertension   Alcohol use disorder, moderate, in early remission, dependence (HCC)   Head ache   ADD (attention deficit disorder)   Long Term Goal(s): Improvement in symptoms so as ready for discharge  Short Term Goals:  Ability to identify triggers associated with substance abuse/mental health issues will improve  Medication Management: Evaluate patient's response, side effects, and tolerance of medication regimen.  Therapeutic Interventions: 1 to 1 sessions, Unit Group sessions and Medication administration.  Evaluation of Outcomes: Progressing   RN Treatment Plan for Primary Diagnosis: Bipolar I disorder, most recent episode manic, severe with psychotic features (HCC) Long Term Goal(s): Knowledge of disease and therapeutic regimen to maintain health will improve  Short Term Goals: Ability to participate in decision making will improve, Ability to verbalize feelings will improve and  Ability to disclose and discuss suicidal ideas  Medication Management: RN will administer medications as ordered by provider, will assess and evaluate patient's response and provide education to patient for prescribed medication. RN will report any adverse and/or side effects to prescribing provider.  Therapeutic Interventions: 1 on 1 counseling sessions, Psychoeducation, Medication administration, Evaluate responses to treatment, Monitor vital signs and CBGs as ordered, Perform/monitor CIWA, COWS, AIMS and Fall Risk screenings as ordered, Perform wound care treatments as ordered.  Evaluation of Outcomes: Progressing   Recreational Therapy Treatment Plan for Primary Diagnosis: Bipolar I disorder, most recent episode manic, severe with psychotic features (HCC)  Long Term Goal(s): LTG- Patient will participate in recreation therapy tx in at least 2 group sessions without prompting from LRT.  Short Term Goals:  Patient will verbalize application of 2 stress management techniques to be used post dc  by conclusion of recreation therapy tx.  Treatment Modalities: Group and Pet Therapy  Therapeutic Interventions: Psychoeducation  Evaluation of Outcomes: Progressing   LCSW Treatment Plan for Primary Diagnosis: Bipolar I disorder, most recent episode manic, severe with psychotic features (HCC) Long Term Goal(s): Safe transition to appropriate next level of care at discharge, Engage patient in therapeutic group addressing interpersonal concerns.  Short Term Goals: Engage patient in aftercare planning with referrals and resources, Identify triggers associated with mental health/substance abuse issues and Increase skills for wellness and recovery  Therapeutic Interventions: Assess for all discharge needs, 1 to 1 time with Social worker, Explore available resources and support systems, Assess for adequacy in community support network, Educate family and significant other(s) on suicide prevention, Complete  Psychosocial Assessment, Interpersonal group therapy.  Evaluation of Outcomes: Progressing  Today, states she is interested in going to rehab with children.  She named 3 places, and CSW provided with phone numbers.  She stated she would follow up, let me know if she needed me to contact anyone.  Outpt providers have been set up locally.   Progress in Treatment: Attending groups: Yes Participating in groups: Yes Taking medication as prescribed: Yes, MD continues to assess for medication changes as needed Toleration medication: Yes, no side effects reported at this time Family/Significant other contact made: No, Pt declines Patient understands diagnosis: Yes AEB  Discussing patient identified problems/goals with staff: Yes Medical problems stabilized or resolved: Yes Denies suicidal/homicidal ideation: Yes Issues/concerns per patient self-inventory: None Other: N/A  New problem(s) identified: None identified at this time.   New Short Term/Long Term Goal(s): None identified at this time.   Discharge Plan or Barriers: CSW will assess for appropriate discharge plan and relevant barriers.   Reason for Continuation of Hospitalization: Anxiety Depression Medication stabilization   Estimated Length of Stay: 3-5 days  Attendees: Patient: 05/10/2016  12:28 PM  Physician: Dr. Elna Breslow 05/10/2016  12:28 PM  Nursing: Marion Downer, RN 05/10/2016  12:28 PM  RN Care Manager: Onnie Boer, RN 05/10/2016  12:28 PM  Social Worker: Richelle Ito, LCSWA 05/10/2016  12:28 PM  Recreational Therapist: Aggie Cosier 05/10/2016  12:28 PM  Other: Armandina Stammer, NP; Gray Bernhardt, NP 05/10/2016  12:28 PM  Other:  05/10/2016  12:28 PM  Other: 05/10/2016  12:28 PM    Scribe for Treatment Team: Richelle Ito, LCSW 05/10/2016 12:28 PM

## 2016-05-10 NOTE — [facility] Group Notes (Signed)
[facility] LCSW Group Therapy  05/10/2016  1:05 PM  Type of Therapy:  Group therapy  Participation Level:  Active  Participation Quality:  Attentive  Affect:  Flat  Cognitive:  Oriented  Insight:  Limited  Engagement in Therapy:  Limited  Modes of Intervention:  Discussion, Socialization  Summary of Progress/Problems:  Chaplain was here to lead a group on themes of hope and courage.  "Hope is trying to get my heart and my identity to match."  Talked about her children, became tearful.  "I need to do something different for them.  I'm looking for a recovery place for me and my kids to stay at.  I need to surround myself with positive, supportive people."  Talked about integrity, and how important that is to her.  Also endorsed hope as something that keeps her from giving up.  [name], [name] 05/10/2016 1:33 PM

## 2016-05-10 NOTE — Progress Notes (Signed)
DAR NOTE: Patient presents with anxious affect and depressed mood.  Denies pain, auditory and visual hallucinations.  Reports poor night sleep.  Rates depression at 4, hopelessness at 4, and anxiety at 8.  Maintained on routine safety checks.  Medications given as prescribed.  Support and encouragement offered as needed.  Attended group and participated.  States goal for today is "aftercare and placement."  Patient observed socializing with peers in the dayroom.  Zofran 4 mg given for complain of nausea and vomiting episode with good effect.

## 2016-05-10 NOTE — Plan of Care (Signed)
Problem: Activity: Goal: Interest or engagement in leisure activities will improve Outcome: Progressing Patient observed in the dayroom. Patient is interactive with peers and watching television.

## 2016-05-10 NOTE — Progress Notes (Signed)
Recreation Therapy Notes  Date: 05/10/16 Time: 1000 Location: 500 Hall Dayroom   Group Topic: Communication, Team Building, Problem Solving  Goal Area(s) Addresses:  Patient will effectively work with peer towards shared goal.  Patient will identify skill used to make activity successful.  Patient will identify how skills used during activity can be used to reach post d/c goals.   Behavioral Response: Engaged  Intervention: STEM Activity   Activity: [place] [place]. Patients were provided the following materials: 5 drinking straws, 5 rubber bands, 5 paper clips, 2 index cards, 2 drinking cups, and 2 toilet paper rolls. Using the provided materials patients were asked to build a launching mechanisms to launch a ping pong ball approximately 12 feet. Patients were divided into teams of 3-5.   Education: Pharmacist, communityocial Skills, Building control surveyorDischarge Planning.   Education Outcome: Acknowledges education/In group clarification offered/Needs additional education.   Clinical Observations/Feedback: Pt stated her team was successful because they used teamwork and communication. Pt expressed the skills from this activity are important because "they help you form a plan, break it down in steps and get people to help you."  Pt went on to express that effective use of these skills post discharge "will help set you up for success because you can make a plan and communicate to others you need help."    Kwali Wrinkle Lillia AbedLindsay, LRT/CTRS       [name], Cuauhtemoc Huegel A 05/10/2016 11:16 AM

## 2016-05-11 LAB — LITHIUM LEVEL: Lithium Lvl: 0.3 mmol/L — ABNORMAL LOW (ref 0.60–1.20)

## 2016-05-11 MED ORDER — LITHIUM CARBONATE 300 MG PO CAPS
600.0000 mg | ORAL_CAPSULE | Freq: Every day | ORAL | Status: DC
  Filled 2016-05-11 (×2): qty 2

## 2016-05-11 NOTE — Progress Notes (Signed)
Adult Psychoeducational Group Note  Date:  05/11/2016 Time:  8:34 PM  Group Topic/Focus:  Wrap-Up Group:   The focus of this group is to help patients review their daily goal of treatment and discuss progress on daily workbooks.  Participation Level:  Active  Participation Quality:  Appropriate  Affect:  Appropriate  Cognitive:  Appropriate  Insight: Appropriate  Engagement in Group:  Engaged  Modes of Intervention:  Discussion  Additional Comments: The patient expressed that she had a good day.The patient also said that she attended all groups.  Octavio Mannshigpen, Shon Indelicato Lee 05/11/2016, 8:34 PM

## 2016-05-11 NOTE — Progress Notes (Signed)
DAR NOTE: Patient presents with anxious affect and mood.  Denies pain, auditory and visual hallucinations.  Described energy level as normal and concentration as poor.  Reports withdrawal symptoms of nausea and runny nose on self inventory form.  Rates depression at 2, hopelessness at 2, and anxiety at 2.  Maintained on routine safety checks.  Medications given as prescribed.  Support and encouragement offered as needed.  Attended group and participated.  States goal for today is "future recovery and housing option for me and kids."  Patient observed socializing with peers in the dayroom.  Motrin 800 mg given for complain of pain with good effect.

## 2016-05-11 NOTE — Plan of Care (Signed)
Problem: Coping: Goal: Ability to verbalize feelings will improve Outcome: Progressing Patient able to discuss with writer disappointment in not seeing her children appropriately. Patient able to verbalize anxiety level and cope with stress.

## 2016-05-11 NOTE — Progress Notes (Signed)
[facility]  05/11/2016 1:53 PM Megan HalstedKelly L Kampe  MRN:  [id number]   Subjective:   Patient reports " I feel the Strattera is helping with my concentration little bite, I still fell like I am all over the place with my thoughts.   Objective:Megan Edwards is awake, alert and oriented X4 , found attending group session.  Denies suicidal or homicidal ideation. Denies auditory or visual hallucination and does not appear to be responding to internal stimuli. Patient reports mild racing thoughts and difficult concentrating. States she feels a lot better without the Suboxon in her system.  Patient reports she is medication compliant without mediation side effects.  Patient report she is excited and feels ready to discharge. Support, encouragement and reassurance was provided.   Principal Problem: Bipolar I disorder, most recent episode manic, severe with psychotic features (HCC) Diagnosis:   Patient Active Problem List   Diagnosis Date Noted  . ADD (attention deficit disorder) [F98.8] 05/10/2016  . Head ache [R51] 05/08/2016  . Opioid use disorder, mild, in controlled environment [F11.10] 05/06/2016  . Essential hypertension [I10] 05/06/2016  . Alcohol use disorder, moderate, in early remission, dependence (HCC) [F10.21] 05/06/2016  . Bipolar I disorder, most recent episode manic, severe with psychotic features (HCC) [F31.2] 01/20/2016  . Chronic pain syndrome [G89.4]   . Morbid obesity due to excess calories (HCC) [E66.01]    Total Time spent with patient: 25 minutes  Past Psychiatric History: Please see H&P.   Past Medical History:  Past Medical History:  Diagnosis Date  . Abnormal Pap smear   . Bipolar 1 disorder (HCC)   . Depression   . Genital herpes 2000  . Hypertension   . Infection    UTI  . Ovarian cyst   . Schizoaffective disorder (HCC) 05/05/2016  . SVT (supraventricular tachycardia) (HCC)     Past Surgical History:  Procedure Laterality Date  .  addenoidectomy    . BREAST ENHANCEMENT SURGERY    . BREAST SURGERY    . KNEE SURGERY    . KNEE SURGERY Left   . TONSILLECTOMY    . WISDOM TOOTH EXTRACTION     Family History:  Family History  Problem Relation Age of Onset  . Alcoholism    . Leukemia    . Cervical cancer    . Bone cancer    . Cirrhosis    . Diabetes Mellitus II    . Depression    . Hypertension    . Thyroid cancer    . Diabetes Mellitus II Mother   . Hypertension Mother   . Alcoholism Father   . Mental illness Paternal Grandmother    Family Psychiatric  History: Please see H&P.  Social History: Please see H&P.  History  Alcohol Use No    Comment: 4 40's beer daily- not currently     History  Drug Use    Comment: former    Social History   Social History  . Marital status: Divorced    Spouse name: N/A  . Number of children: N/A  . 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 Smoking status: Never Smoker  . Smokeless tobacco: Never Used  . Alcohol use No     Comment: 4 40's beer daily- not currently  . Drug use: Yes     Comment: former  . Sexual activity: Not Currently    Birth control/ protection: Abstinence  Other Topics Concern  . None   Social History Narrative  . None   Additional Social History:    Pain Medications: see MAR Prescriptions: see MAR Over the Counter: see MAR History of alcohol / drug use?: Yes Name of Substance 1: Suboxone prescribed by [facility] 1 - Age of First Use: started 8 yrs ago  1 - Amount (size/oz): 16 mg's for the past 3 weeks 1 - Frequency: daily  1 - Duration: 8 years total; recently decreased dosage to 16mg 's 3 weeks ago 1 - Last Use / Amount: 05/02/2016                  Sleep: restless  Appetite:  Fair  Current Medications: Current Facility-Administered Medications  Medication Dose Route Frequency Provider Last Rate Last Dose  . alum & mag hydroxide-simeth  (MAALOX/MYLANTA) 200-200-20 MG/5ML suspension 30 mL  30 mL Oral Q4H PRN Adonis Brook, NP      . atomoxetine (STRATTERA) capsule 40 mg  40 mg Oral Daily Jomarie Longs, MD   40 mg at 05/11/16 0748  . benztropine (COGENTIN) tablet 1 mg  1 mg Oral QHS Jomarie Longs, MD   1 mg at 05/10/16 2117  . gabapentin (NEURONTIN) capsule 200 mg  200 mg Oral [id number] Jomarie Longs, MD   200 mg at 05/11/16 0748  . ibuprofen (ADVIL,MOTRIN) tablet 800 mg  800 mg Oral Q6H PRN Jomarie Longs, MD   800 mg at 05/11/16 0907  . labetalol (NORMODYNE) tablet 100 mg  100 mg Oral QAC breakfast Saramma Eappen, MD   100 mg at 05/11/16 0600  . labetalol (NORMODYNE) tablet 200 mg  200 mg Oral QPM Saramma Eappen, MD   200 mg at 05/10/16 1721  . lithium carbonate capsule 300 mg  300 mg Oral BID WC Jomarie Longs, MD   300 mg at 05/11/16 0748  . magnesium hydroxide (MILK OF MAGNESIA) suspension 30 mL  30 mL Oral Daily PRN Adonis Brook, NP      . paliperidone (INVEGA) 24 hr tablet 9 mg  9 mg Oral QHS Jomarie Longs, MD   9 mg at 05/10/16 2117  . prazosin (MINIPRESS) capsule 2 mg  2 mg Oral QHS Jomarie Longs, MD   2 mg at 05/10/16 2117  . QUEtiapine (SEROQUEL) tablet 100 mg  100 mg Oral TID PRN Jomarie Longs, MD   100 mg at 05/11/16 1204  . zolpidem (AMBIEN) tablet 10 mg  10 mg Oral QHS Jomarie Longs, MD   10 mg at 05/10/16 2118    Lab Results:  Results for orders placed or performed during the hospital encounter of 05/05/16 (from the past 48 hour(s))  Lithium level     Status: Abnormal   Collection Time: 05/11/16  6:27 AM  Result Value Ref Range   Lithium Lvl 0.30 (L) 0.60 - 1.20 mmol/L    Comment: Performed at [facility], [address], [place], [place] [address]    Blood Alcohol level:  Lab Results  Component Value Date   [facility] <5 05/02/2016   ETH <5 04/16/2016    Metabolic Disorder Labs: Lab Results  Component Value Date   HGBA1C 5.4 05/07/2016   MPG 108 05/07/2016   MPG 108  05/06/2016   Lab Results  Component Value Date   PROLACTIN 86.9 (H) 05/07/2016   PROLACTIN 19.4 05/06/2016   Lab Results  Component Value Date   CHOL 161 05/06/2016   TRIG 167 (H) 05/06/2016   HDL  32 (L) 05/06/2016   CHOLHDL 5.0 05/06/2016   VLDL 33 05/06/2016   LDLCALC 96 05/06/2016   LDLCALC 108 (H) 01/22/2016    Physical Findings: AIMS: Facial and Oral Movements Muscles of Facial Expression: None, normal Lips and Perioral Area: None, normal Jaw: None, normal Tongue: None, normal,Extremity Movements Upper (arms, wrists, hands, fingers): None, normal Lower (legs, knees, ankles, toes): None, normal, Trunk Movements Neck, shoulders, hips: None, normal, Overall Severity Severity of abnormal movements (highest score from questions above): None, normal Incapacitation due to abnormal movements: None, normal Patient's awareness of abnormal movements (rate only patient's report): No Awareness, Dental Status Current problems with teeth and/or dentures?: No Does patient usually wear dentures?: No  CIWA:  CIWA-Ar Total: 1 COWS:  COWS Total Score: 1  Musculoskeletal: Strength & Muscle Tone: within normal limits Gait & Station: normal Patient leans: N/A  Psychiatric Specialty Exam: Physical Exam  Nursing note and vitals reviewed. Constitutional: She is oriented to person, place, and time. She appears well-developed.  Cardiovascular: Normal rate.   Neurological: She is alert and oriented to person, place, and time.  Psychiatric: She has a normal mood and affect. Her behavior is normal.    Review of Systems  Psychiatric/Behavioral: Positive for depression and substance abuse. The patient is nervous/anxious and has insomnia.   All other systems reviewed and are negative.   Blood pressure (!) 130/91, pulse 82, temperature 97.8 F (36.6 C), temperature source Oral, resp. rate 18, height 5\' 6"  (1.676 m), weight 97.1 kg (214 lb), last menstrual period 03/09/2016, SpO2 98 %, not  currently breastfeeding.Body mass index is 34.54 kg/m.  General Appearance: Casual  Eye Contact:  Fair  Speech:  Clear and Coherent  Volume:  Normal  Mood:  Anxious and Depressed  Affect:  Congruent  Thought Process:  Goal Directed and Descriptions of Associations: Circumstantial  Orientation:  Full (Time, Place, and Person)  Thought Content:  Paranoid Ideation and Rumination  Suicidal Thoughts:  No  Homicidal Thoughts:  No  Memory:  Immediate;   Fair Remote;   Fair  Judgement:  Fair  Insight:  Fair  Psychomotor Activity:  Restlessness  Concentration:  Concentration: Poor and Attention Span: Poor  Recall:  [employer] of Knowledge:  Fair  Language:  Fair  Akathisia:  No  Handed:  Right  AIMS (if indicated):     Assets:  Desire for Improvement Physical Health Social Support Talents/Skills  ADL's:  Intact  Cognition:  WNL  Sleep:  Number of Hours: 6.75     I agree with current treatment plan on 05/11/2016, Patient seen face-to-face for psychiatric evaluation follow-up, chart reviewed and case discussed with the MD [name]. Reviewed the information documented and agree with the treatment plan.  Treatment Plan Summary: Bipolar I disorder, most recent episode manic, severe with psychotic features (HCC) unstable - improving  Will continue today 05/11/16 plan as below except where it is noted.   Daily contact with patient to assess and evaluate symptoms and progress in treatment, Medication management and Plan please see below For Bipolar disorder: Seroquel discontinued for lack of efficacy , continue Invega ER 9 mg po qhs - psychosis/mood sx.  Plan to discharge on LAI if she tolerates Invega. Medication adjustment :  Will continue Li as scheduled - will Increased Li 300 mg po bid for mood lability. Order Li level for 05/11/16 reviewed (0.30) low   2/17 Li 600 mg PO QHS  Start 2/18 Li 900 mg Po QHS for mood stabilization.  -  Li 300 mg Po BID was  Increased to Li 900 mg PO  QHS  and repeat li level 05/16/2016 -  For PTSD: Referral to CBT. Will increase gabapentin to 200 mg po tid for anxiety/restlessness. Contiune Minipress to 2 mg po qhs for nightmares.   For Insomnia: Discontinue Remeron for lack of efficacy. Discontinue Doxepin for lack of efficacy. Discontinue Lunesta for lack of efficacy. Increase Ambien to 10 mg po qhs for insomnia.  For ADD: Discontinued Adderall. Continue Strattera 40 mg po daily - LFTs reviewed - wnl on admission.  For opioid use do: Pt wants to be taken off of suboxone and she has not had it in 4 days prior to admission. Reviewed today  - COWS/clonidine protocol.  For alcohol use disorder in early remission: Continue substance abuse counseling.  For Headaches: Discussed relaxation techniques /overcoming anxiety sx. Symptomatic treatment as per MAR.  For HTN: Her BP today stable - will continue - Labetolol  300 mg po daily in divided doses Continue to monitor.  Will continue to monitor vitals ,medication compliance and treatment side effects while patient is here.   Will monitor for medical issues as well as call consult as needed.   Reviewed labs - hb- low, hct - low, calcium ionized - low , uds- pos for bzd,stimulants , tsh - wnl ,repeat CBC with diff- wnl ( 03/06/17) , repeat cmp- wnl  ,  hba1c- WNL , pl - elevated - will need to be monitored .  CSW will continue working on disposition.  Patient to participate in therapeutic milieu .  Oneta Rack, NP 05/11/2016, 1:53 PM

## 2016-05-11 NOTE — Progress Notes (Signed)
Nursing Progress Note 7p-7a  D) Patient presents pleasant and cooperative. Patient complains of anxiety at start of shift stating "I thought I was going to get to see my children today. I knew I shouldn't get my hopes up, but I did". Patient states "I have been away from them too much these last few months". Patient denies SI/HI/AVH or pain. Patient contracts for safety at this time. Patient did attend group and was observed interacting with peers in the dayroom. Patient observed with snacks and on the telephone.  A) Emotional support given. 1:1 interaction and active listening provided. Patient medicated with PM orders as prescribed. Medications reviewed with patient. Patient verbalized understanding of medications without further questions.  Snacks and fluids provided. Opportunities for questions or concerns presented to patient. Patient encouraged to continue to work on treatment goals. Labs, vital signs and patient behavior monitored throughout shift. Patient safety maintained with q15 min safety checks.  R) Patient receptive to interaction with nurse. Patient remains safe on the unit at this time. Patient denies any adverse medication reactions at this time. Patient is resting in bed without complaints. Will continue to monitor.

## 2016-05-11 NOTE — [facility] Group Notes (Signed)
Adult Therapy Group Note  Date:  05/11/2016  Time:   11:15AM-12:00PM  Group Topic/Focus: Unhealthy vs Healthy Coping Techniques  Building Self Esteem:    The focus of this group was to discuss healthy and unhealthy coping techniques and become aware of the differences between the two.   Participants were invited to share ideas about how to incorporate more healthy coping techniques.  Motivational Interviewing was used to highlight reasons for change and resistance to change.  Participation Level:  Active  Participation Quality:  Attentive and Sharing  Affect:  Blunted and Tearful  Cognitive:  Appropriate  Insight: Appropriate  Engagement in Group:  Engaged  Modes of Intervention:  Motivational Interviewing, Discussion and Support  Additional Comments:  The patient expressed herself throughout group freely.  She talked about the frustration of sharing about her mental illness and addiction with family members/people close to her, and now they think every problem in her life is due to her MI/SA.  She has thought about just moving somewhere else so she would not have told the people around her and would not have those judgments to deal with.  Ambrose MantleMareida Grossman-Orr, LCSW 05/11/2016   1:11 PM

## 2016-05-12 MED ORDER — LITHIUM CARBONATE 300 MG PO CAPS
900.0000 mg | ORAL_CAPSULE | Freq: Every day | ORAL | Status: DC
  Administered 2016-05-12: 900 mg via ORAL
  Filled 2016-05-12 (×2): qty 3

## 2016-05-12 NOTE — Progress Notes (Signed)
[facility] MD Progress Note  05/12/2016 1:56 PM Megan Edwards  MRN:  [id number]   Subjective:   Patient reports " I am feeling better, I know what I need to do to be a better person for my kids." Patient reports she needs help filing for disability before she goes into long term treatment.   Objective:Megan Edwards is awake, alert and oriented *3. Seen sitting in the dayroom interacting with peers. Denies suicidal or homicidal ideation. Denies auditory or visual hallucination and does not appear to be responding to internal stimuli. Patient continues to report mild racing thoughts and difficult concentrating, however reports the Wilhemena Durie is helping . Patient reports she is medication compliant without mediation side effects. Patient reports a good appetite and states she is resting okay. Support, encouragement and reassurance was provided.   Principal Problem: Bipolar I disorder, most recent episode manic, severe with psychotic features (HCC) Diagnosis:   Patient Active Problem List   Diagnosis Date Noted  . ADD (attention deficit disorder) [F98.8] 05/10/2016  . Head ache [R51] 05/08/2016  . Opioid use disorder, mild, in controlled environment [F11.10] 05/06/2016  . Essential hypertension [I10] 05/06/2016  . Alcohol use disorder, moderate, in early remission, dependence (HCC) [F10.21] 05/06/2016  . Bipolar I disorder, most recent episode manic, severe with psychotic features (HCC) [F31.2] 01/20/2016  . Chronic pain syndrome [G89.4]   . Morbid obesity due to excess calories (HCC) [E66.01]    Total Time spent with patient: 25 minutes  Past Psychiatric History: Please see H&P.   Past Medical History:  Past Medical History:  Diagnosis Date  . Abnormal Pap smear   . Bipolar 1 disorder (HCC)   . Depression   . Genital herpes 2000  . Hypertension   . Infection    UTI  . Ovarian cyst   . Schizoaffective disorder (HCC) 05/05/2016  . SVT (supraventricular tachycardia) (HCC)     Past  Surgical History:  Procedure Laterality Date  . addenoidectomy    . BREAST ENHANCEMENT SURGERY    . BREAST SURGERY    . KNEE SURGERY    . KNEE SURGERY Left   . TONSILLECTOMY    . WISDOM TOOTH EXTRACTION     Family History:  Family History  Problem Relation Age of Onset  . Alcoholism    . Leukemia    . Cervical cancer    . Bone cancer    . Cirrhosis    . Diabetes Mellitus II    . Depression    . Hypertension    . Thyroid cancer    . Diabetes Mellitus II Mother   . Hypertension Mother   . Alcoholism Father   . Mental illness Paternal Grandmother    Family Psychiatric  History: Please see H&P.  Social History: Please see H&P.  History  Alcohol Use No    Comment: 4 40's beer daily- not currently     History  Drug Use    Comment: former    Social History   Social History  . Marital status: Divorced    Spouse name: N/A  . Number of children: N/A  . Years of education: N/A   Occupational History  . [employer]   Social History Main Topics  . Smoking status: Never Smoker  . Smokeless tobacco: Never Used  . Alcohol use No     Comment: 4 40's beer daily- not currently  . Drug use: Yes     Comment: former  . Sexual activity: Not  Currently    Birth control/ protection: Abstinence   Other Topics Concern  . None   Social History Narrative  . None   Additional Social History:    Pain Medications: see MAR Prescriptions: see MAR Over the Counter: see MAR History of alcohol / drug use?: Yes Name of Substance 1: Suboxone prescribed by [facility] 1 - Age of First Use: started 8 yrs ago  1 - Amount (size/oz): 16 mg's for the past 3 weeks 1 - Frequency: daily  1 - Duration: 8 years total; recently decreased dosage to 16mg 's 3 weeks ago 1 - Last Use / Amount: 05/02/2016                  Sleep: restless  Appetite:  Fair  Current Medications: Current Facility-Administered Medications  Medication Dose Route Frequency Provider Last  Rate Last Dose  . alum & mag hydroxide-simeth (MAALOX/MYLANTA) 200-200-20 MG/5ML suspension 30 mL  30 mL Oral Q4H PRN Adonis BrookSheila Agustin, NP      . atomoxetine (STRATTERA) capsule 40 mg  40 mg Oral Daily Jomarie LongsSaramma Eappen, MD   40 mg at 05/12/16 0904  . benztropine (COGENTIN) tablet 1 mg  1 mg Oral QHS Jomarie LongsSaramma Eappen, MD   1 mg at 05/11/16 2115  . gabapentin (NEURONTIN) capsule 200 mg  200 mg Oral [id number] Jomarie LongsSaramma Eappen, MD   200 mg at 05/12/16 0905  . ibuprofen (ADVIL,MOTRIN) tablet 800 mg  800 mg Oral Q6H PRN Jomarie LongsSaramma Eappen, MD   800 mg at 05/12/16 1208  . labetalol (NORMODYNE) tablet 100 mg  100 mg Oral QAC breakfast Jomarie LongsSaramma Eappen, MD   100 mg at 05/12/16 [id number]  . labetalol (NORMODYNE) tablet 200 mg  200 mg Oral QPM Saramma Eappen, MD   200 mg at 05/11/16 1705  . lithium carbonate capsule 600 mg  600 mg Oral QHS Oneta Rackanika N Lewis, NP      . magnesium hydroxide (MILK OF MAGNESIA) suspension 30 mL  30 mL Oral Daily PRN Adonis BrookSheila Agustin, NP      . paliperidone (INVEGA) 24 hr tablet 9 mg  9 mg Oral QHS Jomarie LongsSaramma Eappen, MD   9 mg at 05/11/16 2114  . prazosin (MINIPRESS) capsule 2 mg  2 mg Oral QHS Jomarie LongsSaramma Eappen, MD   2 mg at 05/11/16 2115  . QUEtiapine (SEROQUEL) tablet 100 mg  100 mg Oral TID PRN Jomarie LongsSaramma Eappen, MD   100 mg at 05/11/16 1939  . zolpidem (AMBIEN) tablet 10 mg  10 mg Oral QHS Jomarie LongsSaramma Eappen, MD   10 mg at 05/11/16 2115    Lab Results:  Results for orders placed or performed during the hospital encounter of 05/05/16 (from the past 48 hour(s))  Lithium level     Status: Abnormal   Collection Time: 05/11/16  6:27 AM  Result Value Ref Range   Lithium Lvl 0.30 (L) 0.60 - 1.20 mmol/L    Comment: Performed at [facility], [address] [place], [place] [address]    Blood Alcohol level:  Lab Results  Component Value Date   [facility] 05/02/2016   ETH <5 04/16/2016    Metabolic Disorder Labs: Lab Results  Component Value Date   HGBA1C 5.4 05/07/2016   MPG 108  05/07/2016   MPG 108 05/06/2016   Lab Results  Component Value Date   PROLACTIN 86.9 (H) 05/07/2016   PROLACTIN 19.4 05/06/2016   Lab Results  Component Value Date   CHOL 161 05/06/2016  TRIG 167 (H) 05/06/2016   HDL 32 (L) 05/06/2016   CHOLHDL 5.0 05/06/2016   VLDL 33 05/06/2016   LDLCALC 96 05/06/2016   LDLCALC 108 (H) 01/22/2016    Physical Findings: AIMS: Facial and Oral Movements Muscles of Facial Expression: None, normal Lips and Perioral Area: None, normal Jaw: None, normal Tongue: None, normal,Extremity Movements Upper (arms, wrists, hands, fingers): None, normal Lower (legs, knees, ankles, toes): None, normal, Trunk Movements Neck, shoulders, hips: None, normal, Overall Severity Severity of abnormal movements (highest score from questions above): None, normal Incapacitation due to abnormal movements: None, normal Patient's awareness of abnormal movements (rate only patient's report): No Awareness, Dental Status Current problems with teeth and/or dentures?: No Does patient usually wear dentures?: No  CIWA:  CIWA-Ar Total: 1 COWS:  COWS Total Score: 6  Musculoskeletal: Strength & Muscle Tone: within normal limits Gait & Station: normal Patient leans: N/A  Psychiatric Specialty Exam: Physical Exam  Nursing note and vitals reviewed. Constitutional: She is oriented to person, place, and time. She appears well-developed.  Cardiovascular: Normal rate.   Neurological: She is alert and oriented to person, place, and time.  Psychiatric: She has a normal mood and affect. Her behavior is normal.    Review of Systems  Psychiatric/Behavioral: Positive for depression and substance abuse. The patient is nervous/anxious and has insomnia.   All other systems reviewed and are negative.   Blood pressure 119/70, pulse 83, temperature 98.3 F (36.8 C), temperature source Oral, resp. rate 20, height 5\' 6"  (1.676 m), weight 97.1 kg (214 lb), last menstrual period 03/09/2016,  SpO2 98 %, not currently breastfeeding.Body mass index is 34.54 kg/m.  General Appearance: Casual and Fairly Groomed pleasant   Eye Contact:  Fair  Speech:  Clear and Coherent  Volume:  Normal  Mood:  Anxious and Depressed  Affect:  Congruent  Thought Process:  Goal Directed and Descriptions of Associations: Circumstantial  Orientation:  Full (Time, Place, and Person)  Thought Content:  Rumination  Suicidal Thoughts:  No  Homicidal Thoughts:  No  Memory:  Immediate;   Fair Remote;   Fair  Judgement:  Fair  Insight:  Fair  Psychomotor Activity:  Normal  Concentration:  Concentration: Poor and Attention Span: Poor  Recall:  [employer] of Knowledge:  Fair  Language:  Fair  Akathisia:  No  Handed:  Right  AIMS (if indicated):     Assets:  Desire for Improvement Physical Health Social Support Talents/Skills  ADL's:  Intact  Cognition:  WNL  Sleep:  Number of Hours: 6.75     I agree with current treatment plan on 05/12/2016, Patient seen face-to-face for psychiatric evaluation follow-up, chart reviewed and case discussed with the MD [name]. Reviewed the information documented and agree with the treatment plan.  Treatment Plan Summary: Bipolar I disorder, most recent episode manic, severe with psychotic features (HCC) unstable - improving  Will continue today 05/12/16 plan as below except where it is noted.   Daily contact with patient to assess and evaluate symptoms and progress in treatment, Medication management and Plan please see below For Bipolar disorder: Seroquel discontinued for lack of efficacy , continue Invega ER 9 mg po qhs - psychosis/mood sx.  Plan to discharge on LAI if she tolerates Invega.  Medication adjustment :  Will continue Li as scheduled - will Increased Li 300 mg po bid for mood lability. Order Li level for 05/11/16 reviewed (0.30) low   2/17 Li 600 mg PO QHS  continue  Li 900 mg Po QHS for mood stabilization.  -Li 300 mg Po BID was increased  to Li 900 mg PO QHS  and repeat li level 05/16/2016 -  For PTSD: Referral to CBT. Will increase gabapentin to 200 mg po tid for anxiety/restlessness. Contiune Minipress to 2 mg po qhs for nightmares.   For Insomnia: Discontinue Remeron for lack of efficacy. Discontinue Doxepin for lack of efficacy. Discontinue Lunesta for lack of efficacy. Increase Ambien to 10 mg po qhs for insomnia.  For ADD: Discontinued Adderall. Continue Strattera 40 mg po daily - LFTs reviewed - wnl on admission.  For opioid use do: Pt wants to be taken off of suboxone and she has not had it in 4 days prior to admission. Reviewed today  - COWS/clonidine protocol.  For alcohol use disorder in early remission: Continue substance abuse counseling.  For Headaches: Discussed relaxation techniques /overcoming anxiety sx. Symptomatic treatment as per MAR.  For HTN: Her BP today stable - will continue - Labetolol  300 mg po daily in divided doses Continue to monitor.  Will continue to monitor vitals ,medication compliance and treatment side effects while patient is here.   Will monitor for medical issues as well as call consult as needed.   Reviewed labs - hb- low, hct - low, calcium ionized - low , uds- pos for bzd,stimulants , tsh - wnl ,repeat CBC with diff- wnl ( 03/06/17) , repeat cmp- wnl  ,  hba1c- WNL , pl - elevated - will need to be monitored .  CSW will continue working on disposition.  Patient to participate in therapeutic milieu .  Oneta Rack, NP 05/12/2016,  Reviewed and agree with notes

## 2016-05-12 NOTE — Progress Notes (Signed)
Patient ID: Megan Edwards, female   DOB: September 27, 1975, 41 y.o.   MRN: [id number]    D: Pt has been very loud and intrusive on the unit today. Pt attended all groups and engaged in treatment. Pt reported that her depression was a 1, her hopelessness was a 1, and her anxiety was 2. Pt reported that her goal for today was to concentrate. Pt did request a Seroquel for her anxiety and agitation. Pt reported being negative SI/HI, no AH/VH noted. A: 15 min checks continued for patient safety. R: Pt safety maintained.

## 2016-05-12 NOTE — Progress Notes (Signed)
Adult Psychoeducational Group Note  Date:  05/12/2016 Time:  8:49 PM  Group Topic/Focus:  Wrap-Up Group:   The focus of this group is to help patients review their daily goal of treatment and discuss progress on daily workbooks.  Participation Level:  Active  Participation Quality:  Appropriate and Attentive  Affect:  Appropriate  Cognitive:  Appropriate  Insight: Appropriate  Engagement in Group:  Engaged  Modes of Intervention:  Discussion  Additional Comments:  Pt stated her goal was to be proactive in her future plans, not just for discharge. Pt stated she is working on long-term and short-term goals and made some phone calls today to cross some things off her to do list for resources. Pt was encouraged to make her needs known to staff.  Caswell CorwinOwen, Vencent Hauschild C 05/12/2016, 8:49 PM

## 2016-05-12 NOTE — [facility] Group Notes (Signed)
[facility] Group Notes:  (Clinical Social Work)  05/12/2016  11:00AM-12:00PM  Summary of Progress/Problems:  The main focus of today's process group was to listen to a variety of genres of music and to identify that different types of music provoke different responses.  The patient then was able to identify personally what was soothing for them, as well as energizing, as well as how patient can personally use this knowledge in sleep habits, with depression, and with other symptoms.  The patient expressed at the beginning of group the overall feeling of "fairly high anxiety," rating it at a 7 out of 10.  She participated fully, sang and danced quite a bit, and at the end of group stated her anxiety was "way down, probably a 2 or 3."  Type of Therapy:  Music Therapy   Participation Level:  Active  Participation Quality:  Attentive and Sharing  Affect:  Blunted and Tearful  Cognitive:  Oriented  Insight:  Engaged  Engagement in Therapy:  Engaged  Modes of Intervention:   Activity, Exploration  Ambrose MantleMareida Grossman-Orr, LCSW 05/12/2016

## 2016-05-13 MED ORDER — ZOLPIDEM TARTRATE 10 MG PO TABS: 10.0000 mg | ORAL_TABLET | Freq: Every day | ORAL | 0 refills | Status: DC

## 2016-05-13 MED ORDER — ATOMOXETINE HCL 40 MG PO CAPS: 40.0000 mg | ORAL_CAPSULE | Freq: Every day | ORAL | 0 refills | Status: DC

## 2016-05-13 MED ORDER — LABETALOL HCL 100 MG PO TABS: ORAL_TABLET | ORAL | 0 refills | Status: DC

## 2016-05-13 MED ORDER — PRAZOSIN HCL 2 MG PO CAPS: 2.0000 mg | ORAL_CAPSULE | Freq: Every day | ORAL | 0 refills | Status: DC

## 2016-05-13 MED ORDER — PALIPERIDONE ER 9 MG PO TB24: 9.0000 mg | ORAL_TABLET | Freq: Every day | ORAL | 0 refills | Status: DC

## 2016-05-13 MED ORDER — GABAPENTIN 100 MG PO CAPS: 200.0000 mg | ORAL_CAPSULE | ORAL | 0 refills | Status: DC

## 2016-05-13 MED ORDER — BENZTROPINE MESYLATE 1 MG PO TABS: 1.0000 mg | ORAL_TABLET | Freq: Every day | ORAL | 0 refills | Status: DC

## 2016-05-13 MED ORDER — LITHIUM CARBONATE 300 MG PO CAPS: 900.0000 mg | ORAL_CAPSULE | Freq: Every day | ORAL | 0 refills | Status: DC

## 2016-05-13 MED ORDER — IBUPROFEN 800 MG PO TABS: 800.0000 mg | ORAL_TABLET | Freq: Four times a day (QID) | ORAL | 0 refills | Status: DC | PRN

## 2016-05-13 NOTE — Progress Notes (Signed)
Recreation Therapy Notes  Date: 05/13/16 Time: 1000 Location: 500 Hall Dayroom  Group Topic: Wellness  Goal Area(s) Addresses:  Patient will define components of whole wellness. Patient will verbalize benefit of whole wellness.  Behavioral Response: Engaged  Intervention: Chair exercises, meditation  Activity: Event organiserChair Exercises, Meditation.  LRT introduced chair exercises as a way to get in some physical wellness and meditation as a way to exercise mental wellness.  LRT read script to allow patients to engage in both techniques. Patients were to follow along as LRT read from scripts to engage in both activities.  Education: Wellness, Building control surveyorDischarge Planning.   Education Outcome: Acknowledges education/In group clarification offered/Needs additional education.   Clinical Observations/Feedback: Pt stated wellness is "caring for yourself".  Pt was bright during the activities but was excited about leaving today.  Pt expressed she felt good after group.   Caroll RancherMarjette Alexiz Cothran, LRT/CTRS     [name], Leahna Hewson A 05/13/2016 12:36 PM

## 2016-05-13 NOTE — Progress Notes (Signed)
Patient ID: Megan Edwards, female   DOB: 07-15-1975, 41 y.o.   MRN: [id number] Patient was discharged to home/self care in the company of her mother and son.  Patient stated that she was ready to go home and that she missed her children.  Patient currently denies all symptoms and is able to contract for safety.  Patient acknowledged understanding of all discharge instructions and receipt of all personal belongings.  Patient was bright and smiling upon discharge from hospital.

## 2016-05-13 NOTE — Progress Notes (Signed)
D: Pt at the time of assessment was observed interacting with peers at the dayroom. Pt denied depression, SI, HI or AVH. Pt however, endorsed mild anxiety and bilateral foot pain; states, "tomorrow is a public holiday, I think my kids would be home and I wouldn't be there with them." Pt remained bright and cooperative. A: Medications offered as prescribed.  Support, encouragement, and safe environment provided.  15-minute safety checks continue. R: Pt was med compliant.  Pt attended wrap-up group. Safety checks continue.

## 2016-05-13 NOTE — Plan of Care (Signed)
[facility] in Re[facility] Therapeutic Interventions Goal: STG-Patient will verbalize understanding/application of at l STG: Anxiety - Patient will verbalize understanding and application of at least 2 stress management techniques to be used post discharge by conclusion of recreation therapy tx  Outcome: Adequate for Discharge Pt was able to verbalize understanding of a stress management technique after completion of wellness recreation therapy session.  Caroll RancherMarjette Logann Whitebread, LRT/CTRS

## 2016-05-13 NOTE — [facility] Suicide Risk Assessment (Signed)
[facility]   Principal Problem: Bipolar I disorder, most recent episode manic, severe with psychotic features [facility](HCC) Discharge Diagnoses:  Patient Active Problem List   Diagnosis Date Noted  . ADD (attention deficit disorder) [F98.8] 05/10/2016  . Head ache [R51] 05/08/2016  . Opioid use disorder, mild, in controlled environment [F11.10] 05/06/2016  . Essential hypertension [I10] 05/06/2016  . Alcohol use disorder, moderate, in early remission, dependence (HCC) [F10.21] 05/06/2016  . Bipolar I disorder, most recent episode manic, severe with psychotic features (HCC) [F31.2] 01/20/2016  . Chronic pain syndrome [G89.4]   . Morbid obesity due to excess calories (HCC) [E66.01]     Total Time spent with patient: 30 minutes  Musculoskeletal: Strength & Muscle Tone: within normal limits Gait & Station: normal Patient leans: N/A  Psychiatric Specialty Exam: Review of Systems  Psychiatric/Behavioral: Positive for substance abuse. Negative for depression, hallucinations and suicidal ideas. The patient is not nervous/anxious and does not have insomnia.   All other systems reviewed and are negative.   Blood pressure (!) 145/95, pulse (!) 106, temperature 98.3 F (36.8 C), temperature source Oral, resp. rate 18, height 5\' 6"  (1.676 m), weight 97.1 kg (214 lb), last menstrual period 03/09/2016, SpO2 98 %, not currently breastfeeding.Body mass index is 34.54 kg/m.  General Appearance: Casual  Eye Contact::  Fair  Speech:  Clear and Coherent409  Volume:  Normal  Mood:  Euthymic  Affect:  Appropriate  Thought Process:  Goal Directed and Descriptions of Associations: Intact  Orientation:  Full (Time, Place, and Person)  Thought Content:  Logical  Suicidal Thoughts:  No  Homicidal Thoughts:  No  Memory:  Immediate;   Fair Recent;   Fair Remote;   Fair  Judgement:  Fair  Insight:  Fair  Psychomotor Activity:  Normal  Concentration:  Fair  Recall:  [employer]  Fund  of Knowledge:Fair  Language: Fair  Akathisia:  No  Handed:  Right  AIMS (if indicated):   0  Assets:  Communication Skills Desire for Improvement  Sleep:  Number of Hours: 6  Cognition: WNL  ADL's:  Intact   Mental Status Per Nursing Assessment::   On Admission:     Demographic Factors:  Caucasian  Loss Factors: NA  Historical Factors: Impulsivity  Risk Reduction Factors:   Positive social support and Positive therapeutic relationship  Continued Clinical Symptoms:  Alcohol/Substance Abuse/Dependencies Previous Psychiatric Diagnoses and Treatments  Cognitive Features That Contribute To Risk:  None    Suicide Risk:  Minimal: No identifiable suicidal ideation.  Patients presenting with no risk factors but with morbid ruminations; may be classified as minimal risk based on the severity of the depressive symptoms  Follow-up Information    [facility] Follow up on 05/15/2016.   Specialty:  Family Medicine Why:  Wednesday, May 15, 2016 at 1:30pm.  Contact information: [address] [place] [address] [phone]           Plan Of Care/Follow-up recommendations:  Activity:  no restrictions Diet:  regular Tests:  Li level due on 05/16/16.Li level to be done in the AM 10 -12 hrs after her last dose of Li and prior to AM dose. Other:  none  Adonijah Baena, MD 05/13/2016, 9:40 AM
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
## 2016-05-13 NOTE — Tx Team (Signed)
Interdisciplinary Treatment and Diagnostic Plan Update  05/13/2016 Time of Session: 9:30am Megan Edwards MRN: [id number]  Principal Diagnosis: Bipolar I disorder, most recent episode manic, severe with psychotic features (HCC)  Secondary Diagnoses: Principal Problem:   Bipolar I disorder, most recent episode manic, severe with psychotic features (HCC) Active Problems:   Chronic pain syndrome   Opioid use disorder, mild, in controlled environment   Essential hypertension   Alcohol use disorder, moderate, in early remission, dependence (HCC)   Head ache   ADD (attention deficit disorder)   Current Medications:  Current Facility-Administered Medications  Medication Dose Route Frequency Provider Last Rate Last Dose  . alum & mag hydroxide-simeth (MAALOX/MYLANTA) 200-200-20 MG/5ML suspension 30 mL  30 mL Oral Q4H PRN Adonis Brook, NP      . atomoxetine (STRATTERA) capsule 40 mg  40 mg Oral Daily Jomarie Longs, MD   40 mg at 05/13/16 0811  . benztropine (COGENTIN) tablet 1 mg  1 mg Oral QHS Jomarie Longs, MD   1 mg at 05/12/16 2115  . gabapentin (NEURONTIN) capsule 200 mg  200 mg Oral [id number] Jomarie Longs, MD   200 mg at 05/13/16 0811  . ibuprofen (ADVIL,MOTRIN) tablet 800 mg  800 mg Oral Q6H PRN Jomarie Longs, MD   800 mg at 05/13/16 0812  . labetalol (NORMODYNE) tablet 100 mg  100 mg Oral QAC breakfast Jomarie Longs, MD   100 mg at 05/13/16 4742  . labetalol (NORMODYNE) tablet 200 mg  200 mg Oral QPM Saramma Eappen, MD   200 mg at 05/12/16 1625  . lithium carbonate capsule 900 mg  900 mg Oral QHS Oneta Rack, NP   900 mg at 05/12/16 2115  . magnesium hydroxide (MILK OF MAGNESIA) suspension 30 mL  30 mL Oral Daily PRN Adonis Brook, NP      . paliperidone (INVEGA) 24 hr tablet 9 mg  9 mg Oral QHS Jomarie Longs, MD   9 mg at 05/12/16 2114  . prazosin (MINIPRESS) capsule 2 mg  2 mg Oral QHS Jomarie Longs, MD   2 mg at 05/12/16 2114  . QUEtiapine (SEROQUEL) tablet 100 mg   100 mg Oral TID PRN Jomarie Longs, MD   100 mg at 05/12/16 2300  . zolpidem (AMBIEN) tablet 10 mg  10 mg Oral QHS Jomarie Longs, MD   10 mg at 05/12/16 2114    PTA Medications: Prescriptions Prior to Admission  Medication Sig Dispense Refill Last Dose  . ADDERALL XR 30 MG 24 hr capsule Take 30 mg by mouth 2 (two) times daily.  0 05/03/2016 at am  . BIOTIN PO Take 1 tablet by mouth every morning.   05/02/2016 at am  . famotidine (PEPCID AC) 10 MG chewable tablet Chew 10 mg by mouth See admin instructions. Two to three times a day before meals   Past Week at Unknown time  . promethazine (PHENERGAN) 6.25 MG/5ML syrup Take 20 mLs (25 mg total) by mouth every 6 (six) hours as needed for nausea or vomiting. 120 mL 0 Past Month at Unknown time  . QUEtiapine (SEROQUEL) 200 MG tablet Take 1 tablet (200 mg total) by mouth 3 (three) times daily. 90 tablet 1 05/02/2016 at am  . SUBOXONE 8-2 MG FILM Place 1 Film under the tongue 2 (two) times daily.   0 05/02/2016 at Unknown time  . traZODone (DESYREL) 150 MG tablet Take 300 mg by mouth at bedtime as needed for sleep.   Past Week at Unknown time  . [  DISCONTINUED] labetalol (NORMODYNE) 100 MG tablet Take 1 tablet (100 mg total) by mouth 2 (two) times daily. (Patient taking differently: Take 100 mg by mouth 3 (three) times daily. ) 60 tablet 1 05/01/2016 at pm    Treatment Modalities: Medication Management, Group therapy, Case management,  1 to 1 session with clinician, Psychoeducation, Recreational therapy.  Patient Stressors: Paediatric nurse issue Marital or family conflict Substance abuse  Patient Strengths: Capable of independent living Motivation for treatment/growth Religious Affiliation  Physician Treatment Plan for Primary Diagnosis: Bipolar I disorder, most recent episode manic, severe with psychotic features (HCC) Long Term Goal(s): Improvement in symptoms so as ready for discharge  Short Term Goals: Compliance with prescribed  medications will improve  Medication Management: Evaluate patient's response, side effects, and tolerance of medication regimen.  Therapeutic Interventions: 1 to 1 sessions, Unit Group sessions and Medication administration.  Evaluation of Outcomes: Adequate for Discharge  Physician Treatment Plan for Secondary Diagnosis: Principal Problem:   Bipolar I disorder, most recent episode manic, severe with psychotic features (HCC) Active Problems:   Chronic pain syndrome   Opioid use disorder, mild, in controlled environment   Essential hypertension   Alcohol use disorder, moderate, in early remission, dependence (HCC)   Head ache   ADD (attention deficit disorder)   Long Term Goal(s): Improvement in symptoms so as ready for discharge  Short Term Goals:  Ability to identify triggers associated with substance abuse/mental health issues will improve  Medication Management: Evaluate patient's response, side effects, and tolerance of medication regimen.  Therapeutic Interventions: 1 to 1 sessions, Unit Group sessions and Medication administration.  Evaluation of Outcomes: Adequate for Discharge   RN Treatment Plan for Primary Diagnosis: Bipolar I disorder, most recent episode manic, severe with psychotic features (HCC) Long Term Goal(s): Knowledge of disease and therapeutic regimen to maintain health will improve  Short Term Goals: Ability to participate in decision making will improve, Ability to verbalize feelings will improve and Ability to disclose and discuss suicidal ideas  Medication Management: RN will administer medications as ordered by provider, will assess and evaluate patient's response and provide education to patient for prescribed medication. RN will report any adverse and/or side effects to prescribing provider.  Therapeutic Interventions: 1 on 1 counseling sessions, Psychoeducation, Medication administration, Evaluate responses to treatment, Monitor vital signs and CBGs  as ordered, Perform/monitor CIWA, COWS, AIMS and Fall Risk screenings as ordered, Perform wound care treatments as ordered.  Evaluation of Outcomes: Adequate for Discharge   Recreational Therapy Treatment Plan for Primary Diagnosis: Bipolar I disorder, most recent episode manic, severe with psychotic features (HCC)  Long Term Goal(s): LTG- Patient will participate in recreation therapy tx in at least 2 group sessions without prompting from LRT.  Short Term Goals:  Patient will verbalize application of 2 stress management techniques to be used post dc by conclusion of recreation therapy tx.  Treatment Modalities: Group and Pet Therapy  Therapeutic Interventions: Psychoeducation  Evaluation of Outcomes: Adequate for Discharge   LCSW Treatment Plan for Primary Diagnosis: Bipolar I disorder, most recent episode manic, severe with psychotic features (HCC) Long Term Goal(s): Safe transition to appropriate next level of care at discharge, Engage patient in therapeutic group addressing interpersonal concerns.  Short Term Goals: Engage patient in aftercare planning with referrals and resources, Identify triggers associated with mental health/substance abuse issues and Increase skills for wellness and recovery  Therapeutic Interventions: Assess for all discharge needs, 1 to 1 time with Social worker, Explore available resources and  support systems, Assess for adequacy in community support network, Educate family and significant other(s) on suicide prevention, Complete Psychosocial Assessment, Interpersonal group therapy.  Evaluation of Outcomes: Adequate for Discharge  Today, states she is interested in going to rehab with children.  She named 3 places, and CSW provided with phone numbers.  She stated she would follow up, let me know if she needed me to contact anyone.  Outpt providers have been set up locally.   Progress in Treatment: Attending groups: Yes Participating in groups: Yes Taking  medication as prescribed: Yes, MD continues to assess for medication changes as needed Toleration medication: Yes, no side effects reported at this time Family/Significant other contact made: No, Pt declines Patient understands diagnosis: Yes AEB  Discussing patient identified problems/goals with staff: Yes Medical problems stabilized or resolved: Yes Denies suicidal/homicidal ideation: Yes Issues/concerns per patient self-inventory: None Other: N/A  New problem(s) identified: None identified at this time.   New Short Term/Long Term Goal(s): None identified at this time.   Discharge Plan or Barriers:Return home, [facility] and current therapist  Reason for Continuation of Hospitalization:   Estimated Length of Stay: D/C today  Attendees: Patient: 05/13/2016  10:37 AM  Physician: Dr. Elna BreslowEappen 05/13/2016  10:37 AM  Nursing: Elby Beckoni Waller, RN 05/13/2016  10:37 AM  RN Care Manager: Onnie BoerJennifer Clark, RN 05/13/2016  10:37 AM  Social Worker: Richelle Itood Deago Burruss, LCSWA 05/13/2016  10:37 AM  Recreational Therapist: Aggie CosierMarjette Lindsey 05/13/2016  10:37 AM  Other: Armandina StammerAgnes Nwoko, NP; Gray BernhardtMay Augustin, NP 05/13/2016  10:37 AM  Other:  05/13/2016  10:37 AM  Other: 05/13/2016  10:37 AM    Scribe for Treatment Team: Richelle Itood Leandre Wien, LCSW 05/13/2016 10:37 AM

## 2016-05-13 NOTE — Progress Notes (Signed)
  [facility] Discharge Plan :  Will you be returning to the same living situation after discharge:  Yes,  home At discharge, do you have transportation home?: Yes,  family Do you have the ability to pay for your medications: Yes,  MCD  Release of information consent forms completed and in the chart;  Patient's signature needed at discharge.  Patient to Follow up at: Follow-up Information    [facility] Follow up on 05/15/2016.   Specialty:  Family Medicine Why:  Wednesday, May 15, 2016 at 1:30pm.  Talk to them this day about getting a lithium level check. Contact information: [address] [place] [address] [phone]        [facility] Follow up.   Why:  Resume sessions with your therapist as scheduled. Contact information: [address]  [place] [phone]           Next level of care provider has access to [facility] Link:no  Safety Planning and Suicide Prevention discussed: Yes,  yes  Have you used any form of tobacco in the last 30 days? (Cigarettes, Smokeless Tobacco, Cigars, and/or Pipes): No  Has patient been referred to the Quitline?: N/A patient is not a smoker  Patient has been referred for addiction treatment: Yes  Ida RogueRodney B Kesler Wickham 05/13/2016, 10:34 AM

## 2016-06-20 ENCOUNTER — Other Ambulatory Visit: Payer: Self-pay | Admitting: Obstetrics

## 2016-06-20 ENCOUNTER — Other Ambulatory Visit (HOSPITAL_COMMUNITY)
Admission: RE | Admit: 2016-06-20 | Discharge: 2016-06-20 | Disposition: A | Discharge status: Home / Self Care | Payer: Medicaid Other | Source: Ambulatory Visit | Attending: Obstetrics | Admitting: Obstetrics

## 2016-06-20 ENCOUNTER — Ambulatory Visit (INDEPENDENT_AMBULATORY_CARE_PROVIDER_SITE_OTHER): Payer: Medicaid Other | Admitting: Obstetrics

## 2016-06-20 ENCOUNTER — Encounter: Payer: Self-pay | Admitting: Obstetrics

## 2016-06-20 VITALS — BP 148/94 | HR 100 | Ht 68.0 in | Wt 225.0 lb

## 2016-06-20 DIAGNOSIS — Z3202 Encounter for pregnancy test, result negative: Secondary | ICD-10-CM | POA: Diagnosis not present

## 2016-06-20 DIAGNOSIS — Z01419 Encounter for gynecological examination (general) (routine) without abnormal findings: Secondary | ICD-10-CM

## 2016-06-20 DIAGNOSIS — Z1231 Encounter for screening mammogram for malignant neoplasm of breast: Secondary | ICD-10-CM

## 2016-06-20 DIAGNOSIS — R51 Headache: Secondary | ICD-10-CM

## 2016-06-20 DIAGNOSIS — H538 Other visual disturbances: Secondary | ICD-10-CM

## 2016-06-20 DIAGNOSIS — N898 Other specified noninflammatory disorders of vagina: Secondary | ICD-10-CM

## 2016-06-20 DIAGNOSIS — Z1239 Encounter for other screening for malignant neoplasm of breast: Secondary | ICD-10-CM

## 2016-06-20 DIAGNOSIS — E221 Hyperprolactinemia: Secondary | ICD-10-CM

## 2016-06-20 DIAGNOSIS — N912 Amenorrhea, unspecified: Secondary | ICD-10-CM

## 2016-06-20 DIAGNOSIS — R519 Headache, unspecified: Secondary | ICD-10-CM

## 2016-06-20 LAB — POCT URINE PREGNANCY: PREG TEST UR: NEGATIVE

## 2016-06-20 NOTE — Progress Notes (Addendum)
Subjective:        Megan Edwards is a 41 y.o. female here for a routine exam.  Current complaints: No period for ~ 2 years.  She delivered ~ 1 year ago and has had no period since then ner LMP before pregnancy.  Also complains of leaking milk from breasts, severe headaches, blurred vision mainly of peripheral vision fields.  PMH significant for Bi[polar Disorder, HTN and Substance Abuse.  Personal health questionnaire:  Is patient Ashkenazi Jewish, have a family history of breast and/or ovarian cancer: no Is there a family history of uterine cancer diagnosed at age < 6, gastrointestinal cancer, urinary tract cancer, family member who is a Personnel officer syndrome-associated carrier: no Is the patient overweight and hypertensive, family history of diabetes, personal history of gestational diabetes, preeclampsia or PCOS: no Is patient over 79, have PCOS,  family history of premature CHD under age 49, diabetes, smoke, have hypertension or peripheral artery disease:  no At any time, has a partner hit, kicked or otherwise hurt or frightened you?: no Over the past 2 weeks, have you felt down, depressed or hopeless?: no Over the past 2 weeks, have you felt little interest or pleasure in doing things?:no   Gynecologic History No LMP recorded (within weeks). Contraception: none Last Pap: unknown. Results were: normal Last mammogram: n/a. Results were: n/a  Obstetric History OB History  Gravida Para Term Preterm AB Living  5 2 2  0 3 2  SAB TAB Ectopic Multiple Live Births  1 2 0 0 2    # Outcome Date GA Lbr Len/2nd Weight Sex Delivery Anes PTL Lv  5 Term 06/12/15 [redacted]w[redacted]d 46:00 / 00:23 6 lb 14.4 oz (3.13 kg) M Vag-Spont EPI  LIV  4 Term 11/26/12 [redacted]w[redacted]d 05:29 / 01:08 8 lb 15.9 oz (4.08 kg) M Vag-Spont Spinal  LIV     Birth Comments: Facial bruising noted  3 SAB           2 TAB           1 TAB               Past Medical History:  Diagnosis Date  . Abnormal Pap smear   . Bipolar 1 disorder  (HCC)   . Depression   . Genital herpes 2000  . Hypertension   . Infection    UTI  . Ovarian cyst   . Schizoaffective disorder (HCC) 05/05/2016  . SVT (supraventricular tachycardia) (HCC)     Past Surgical History:  Procedure Laterality Date  . addenoidectomy    . BREAST ENHANCEMENT SURGERY    . BREAST SURGERY    . KNEE SURGERY    . KNEE SURGERY Left   . TONSILLECTOMY    . WISDOM TOOTH EXTRACTION       Current Outpatient Prescriptions:  .  atomoxetine (STRATTERA) 40 MG capsule, Take 1 capsule (40 mg total) by mouth daily. For ADHD, Disp: 30 capsule, Rfl: 0 .  benztropine (COGENTIN) 1 MG tablet, Take 1 tablet (1 mg total) by mouth at bedtime. For prevention drug related tremors., Disp: 30 tablet, Rfl: 0 .  gabapentin (NEURONTIN) 100 MG capsule, Take 2 capsules (200 mg total) by mouth 2 (two) times daily and at bedtime. For agitation, Disp: 120 capsule, Rfl: 0 .  ibuprofen (ADVIL,MOTRIN) 800 MG tablet, Take 1 tablet (800 mg total) by mouth every 6 (six) hours as needed for fever, headache, mild pain, moderate pain or cramping., Disp: 10 tablet, Rfl: 0 .  labetalol (NORMODYNE) 100 MG tablet, Take 1 tablet (100 mg) in the morning & 2 tablets (200 mg) in the evening: For high blood pressure, Disp: 21 tablet, Rfl: 0 .  lithium carbonate 300 MG capsule, Take 3 capsules (900 mg total) by mouth at bedtime. For mood stabilization, Disp: 90 capsule, Rfl: 0 .  paliperidone (INVEGA) 9 MG 24 hr tablet, Take 1 tablet (9 mg total) by mouth at bedtime. For mood control, Disp: 30 tablet, Rfl: 0 .  prazosin (MINIPRESS) 2 MG capsule, Take 1 capsule (2 mg total) by mouth at bedtime. For nightmares, Disp: 30 capsule, Rfl: 0 .  zolpidem (AMBIEN) 10 MG tablet, Take 1 tablet (10 mg total) by mouth at bedtime. For sleep, Disp: 7 tablet, Rfl: 0 Allergies  Allergen Reactions  . Other Other (See Comments)    Pt does not take narcotics, is on Suboxone.   Marland Kitchen. Penicillins Rash and Other (See Comments)    Has  patient had a PCN reaction causing immediate rash, facial/tongue/throat swelling, SOB or lightheadedness with hypotension: Yes Has patient had a PCN reaction causing severe rash involving mucus membranes or skin necrosis: No Has patient had a PCN reaction that required hospitalization No Has patient had a PCN reaction occurring within the last 10 years: No If all of the above answers are "NO", then may proceed with Cephalosporin use.    Social History  Substance Use Topics  . Smoking status: Never Smoker  . Smokeless tobacco: Never Used  . Alcohol use No     Comment: 4 40's beer daily- not currently    Family History  Problem Relation Age of Onset  . Alcoholism    . Leukemia    . Cervical cancer    . Bone cancer    . Cirrhosis    . Diabetes Mellitus II    . Depression    . Hypertension    . Thyroid cancer    . Diabetes Mellitus II Mother   . Hypertension Mother   . Alcoholism Father   . Mental illness Paternal Grandmother       Review of Systems  Constitutional: negative for fatigue and weight loss Respiratory: negative for cough and wheezing Cardiovascular: negative for chest pain, fatigue and palpitations Gastrointestinal: negative for abdominal pain and change in bowel habits Musculoskeletal:negative for myalgias Neurological: positive fo`r headaches and blurred vision Behavioral/Psych: negative for abusive relationship, depression Endocrine: negative for temperature intolerance    Genitourinary:negative for abnormal menstrual periods, genital lesions, hot flashes, sexual problems and vaginal discharge Integument/breast: positive for leaking milk from breasts    Objective:       BP (!) 148/94   Pulse 100   Ht 5\' 8"  (1.727 m)   Wt 225 lb (102.1 kg)   LMP  (Within Weeks) Comment: unsure LMP 04/2016 light spotting  BMI 34.21 kg/m  General:   alert  Skin:   no rash or abnormalities  Lungs:   clear to auscultation bilaterally  Heart:   regular rate and rhythm,  S1, S2 normal, no murmur, click, rub or gallop  Breasts:   normal without suspicious masses, skin or nipple changes or axillary nodes  Abdomen:  normal findings: no organomegaly, soft, non-tender and no hernia  Pelvis:  External genitalia: normal general appearance Urinary system: urethral meatus normal and bladder without fullness, nontender Vaginal: normal without tenderness, induration or masses Cervix: normal appearance Adnexa: normal bimanual exam Uterus: anteverted and non-tender, normal size   Lab Review Urine pregnancy test Labs  reviewed yes Radiologic studies reviewed yes  50% of 20 min visit spent on counseling and coordination of care.    Assessment:    Healthy female exam.    Hyperprolactinemia    Galactorrhea  Headaches  Blurred Vision  Amenorrhea R/O Pituitary Adenoma       Plan:   Head CT with and without contrast Referred to Endocrinology   Education reviewed: calcium supplements, depression evaluation, low fat, low cholesterol diet, safe sex/STD prevention, self breast exams and weight bearing exercise. Mammogram ordered. Follow up in: 1 year. Head CT with and without contrast ordered    Orders Placed This Encounter  Procedures  . CT HEAD W & WO CONTRAST    Standing Status:   Future    Standing Expiration Date:   09/20/2017    Order Specific Question:   If indicated for the ordered procedure, I authorize the administration of contrast media per Radiology protocol    Answer:   Yes    Order Specific Question:   Reason for Exam (SYMPTOM  OR DIAGNOSIS REQUIRED)    Answer:   hyperprolactin level and amenorrhea    Order Specific Question:   Is patient pregnant?    Answer:   No    Order Specific Question:   Preferred imaging location?    Answer:   [facility]    Order Specific Question:   Radiology Contrast Protocol - do NOT remove file path    Answer:   [email or web address]  . MM Digital Screening    Standing Status:    Future    Standing Expiration Date:   08/20/2017    Order Specific Question:   Reason for Exam (SYMPTOM  OR DIAGNOSIS REQUIRED)    Answer:   Screening    Order Specific Question:   Is the patient pregnant?    Answer:   No    Order Specific Question:   Preferred imaging location?    Answer:   [facility]  . Ambulatory referral to Endocrinology    Referral Priority:   Routine    Referral Type:   Consultation    Referral Reason:   Specialty Services Required    Number of Visits Requested:   1  . POCT urine pregnancy     Patient ID: [name], female   DOB: [birth year], 41 y.o.   MRN: [id number]

## 2016-06-20 NOTE — Progress Notes (Signed)
Pt presents for annual, pap, and start BC. Pt c/o amenorrhea x 2 yrs. She noticed spotting 04/2016.

## 2016-06-20 NOTE — Addendum Note (Signed)
Addended by: Dalphine HandingGARDNER, Aziel Morgan L on: 06/20/2016 02:08 PM   Modules accepted: Orders

## 2016-06-21 LAB — CERVICOVAGINAL ANCILLARY ONLY
Bacterial vaginitis: NEGATIVE
Candida vaginitis: NEGATIVE
Chlamydia: NEGATIVE
Neisseria Gonorrhea: NEGATIVE
Trichomonas: NEGATIVE

## 2016-06-25 LAB — CYTOLOGY - PAP
DIAGNOSIS: NEGATIVE
HPV (WINDOPATH): NOT DETECTED

## 2016-07-01 ENCOUNTER — Ambulatory Visit (HOSPITAL_COMMUNITY)
Admission: RE | Admit: 2016-07-01 | Discharge: 2016-07-01 | Disposition: A | Discharge status: Home / Self Care | Payer: Medicaid Other | Source: Ambulatory Visit | Attending: Obstetrics | Admitting: Obstetrics

## 2016-07-01 ENCOUNTER — Encounter (HOSPITAL_COMMUNITY): Payer: Self-pay | Admitting: Radiology

## 2016-07-01 DIAGNOSIS — N912 Amenorrhea, unspecified: Secondary | ICD-10-CM | POA: Insufficient documentation

## 2016-07-01 DIAGNOSIS — E221 Hyperprolactinemia: Secondary | ICD-10-CM | POA: Diagnosis not present

## 2016-07-01 MED ORDER — IOPAMIDOL (ISOVUE-300) INJECTION 61%
INTRAVENOUS | Status: AC
  Filled 2016-07-01: qty 75

## 2016-07-01 MED ORDER — IOPAMIDOL (ISOVUE-300) INJECTION 61%
75.0000 mL | Freq: Once | INTRAVENOUS | Status: AC | PRN
  Administered 2016-07-01: 75 mL via INTRAVENOUS

## 2016-07-09 ENCOUNTER — Encounter: Payer: Self-pay | Admitting: *Deleted

## 2016-07-16 ENCOUNTER — Ambulatory Visit: Payer: Self-pay

## 2016-07-22 ENCOUNTER — Emergency Department (HOSPITAL_COMMUNITY)
Admission: EM | Admit: 2016-07-22 | Discharge: 2016-07-23 | Disposition: A | Discharge status: Home / Self Care | Payer: Medicaid Other | Attending: Emergency Medicine | Admitting: Emergency Medicine

## 2016-07-22 ENCOUNTER — Encounter (HOSPITAL_COMMUNITY): Payer: Self-pay

## 2016-07-22 DIAGNOSIS — F909 Attention-deficit hyperactivity disorder, unspecified type: Secondary | ICD-10-CM | POA: Diagnosis not present

## 2016-07-22 DIAGNOSIS — Z79899 Other long term (current) drug therapy: Secondary | ICD-10-CM | POA: Insufficient documentation

## 2016-07-22 DIAGNOSIS — R4182 Altered mental status, unspecified: Secondary | ICD-10-CM | POA: Diagnosis present

## 2016-07-22 DIAGNOSIS — R410 Disorientation, unspecified: Secondary | ICD-10-CM | POA: Diagnosis not present

## 2016-07-22 DIAGNOSIS — I10 Essential (primary) hypertension: Secondary | ICD-10-CM | POA: Insufficient documentation

## 2016-07-22 NOTE — Progress Notes (Signed)
Consult request has been received. CSW following up at present time.  Guillermo Difrancesco F. Deronda Christian, LCSWA, LCAS Clinical Social Worker Ph: [phone]  

## 2016-07-22 NOTE — ED Triage Notes (Signed)
Per EMS- A neighbor called for a welfare check. When EMS arrived, they found a child in a crib and another child running around and the patient was sound asleep in her bedroom. A neighbor reported that the patient had a substance abuse history and is currently taking suboxone. Patient states she did not take suboxone today. Patient alert to her name and the city only.

## 2016-07-22 NOTE — Progress Notes (Addendum)
CSW called to file a CPS report on the pt and the pt's children being found this morning with the pt unconscious and an unattended one year old an an unattended four year old in the home and that the pt has a substance abuse HX, that the pt is currently prescribed lithium and suboxone and that the pt was found asleep in what may be termed a medicated state due to the time of day and unattended children in the home.  CPS stated a report had been filed earlier in the morning and the CSW added details, per the notes, to said report.    Per staff, the pt's neighbor/significant other is with the pt's children, but CSW called the Non-emergency Police Dept Line to ask that a welfare check be completed so the police can determine that the children are safe and attended to before pt is discharged.  Per CPS, a "24 Hour" report has been filed stating a Stage manager must visit the pt's home within a 24 hour period to investigate.  Per CPS worker a worker will visit the pt's home on 07/23/16.  Please reconsult if future social work needs arise.    11:10 PM [employer] officer who originally completed welfare check on the pt and her children stated he had talked to the pt's significant other/neighbor who is the father of one of the children and had stated he will be with the children until the morning of 5/1.  Police officer said he had scheduled a policeman to visit the home at 12:30am on 5/1 (after midnight tonight) to insure the pt's children are still attended to.  Please reconsult if future social work needs arise.     Dorothe Pea. Jerrald Doverspike, Theresia Majors, LCAS Clinical Social Worker Ph: [phone]

## 2016-07-22 NOTE — ED Notes (Signed)
Pt is alert and oriented x 3 pt is unable to recall why she has been hospitalized today, and while telling her story was difficult to folllow the content. Pt has slightly slurred speech.

## 2016-07-22 NOTE — ED Triage Notes (Signed)
Patient states she last  took suboxone at 0700 today, but had trouble remembering when she did take it.

## 2016-07-22 NOTE — ED Notes (Signed)
Bed: WG95 Expected date:  Expected time:  Means of arrival: Ambulance Comments: Intoxication?

## 2016-07-22 NOTE — ED Provider Notes (Signed)
[facility]-EMERGENCY DEPT Provider Note   CSN: [id number] Arrival date & time: 07/22/16  4098  By signing my name below, I, Megan Edwards, attest that this documentation has been prepared under the direction and in the presence of physician practitioner, Raeford Razor, MD. Electronically Signed: Linna Edwards, Scribe. 07/22/2016. 7:29 PM.  History   Chief Complaint Chief Complaint  Patient presents with  . Altered Mental Status   The history is provided by the patient and medical records. No language interpreter was used.   HPI Comments: LEVEL 5 CAVEAT FOR ALTERED MENTAL STATUS Megan Edwards is a 41 y.o. female with PMHx of substance abuse, schizoaffective disorder, bipolar 1, and depression who presents to the Emergency Department via EMS for evaluation of altered mental status. Per the triage note, pt's neighbor called EMS after patient's two young children (a baby and a toddler) were noticed to be unsupervised at home while patient was asleep. Pt notes that she recently received joint custody of her children. She is currently taking Suboxone and last took it sometime this morning. Patient states she does not know why she is in the ED and does not remember how she arrived here. She reports that she has felt unusually fatigued and lethargic lately. Patient denies any illicit drug or alcohol use today. No other complaints noted at this time.  Past Medical History:  Diagnosis Date  . Abnormal Pap smear   . Bipolar 1 disorder (HCC)   . Depression   . Genital herpes 2000  . Hypertension   . Infection    UTI  . Ovarian cyst   . Schizoaffective disorder (HCC) 05/05/2016  . SVT (supraventricular tachycardia) [facility])     Patient Active Problem List   Diagnosis Date Noted  . ADD (attention deficit disorder) 05/10/2016  . Head ache 05/08/2016  . Opioid use disorder, mild, in controlled environment 05/06/2016  . Essential hypertension 05/06/2016  . Alcohol use disorder, moderate, in early  remission, dependence (HCC) 05/06/2016  . Bipolar I disorder, most recent episode manic, severe with psychotic features (HCC) 01/20/2016  . Chronic pain syndrome   . Morbid obesity due to excess calories [facility])     Past Surgical History:  Procedure Laterality Date  . addenoidectomy    . BREAST ENHANCEMENT SURGERY    . BREAST SURGERY    . KNEE SURGERY    . KNEE SURGERY Left   . TONSILLECTOMY    . WISDOM TOOTH EXTRACTION      OB History    Gravida Para Term Preterm AB Living   0 3 2   SAB TAB Ectopic Multiple Live Births   1 2 0 0 2       Home Medications    Prior to Admission medications   Medication Sig Start Date End Date Taking? Authorizing Provider  atomoxetine (STRATTERA) 40 MG capsule Take 1 capsule (40 mg total) by mouth daily. For ADHD 05/14/16   Sanjuana Kava, NP  benztropine (COGENTIN) 1 MG tablet Take 1 tablet (1 mg total) by mouth at bedtime. For prevention drug related tremors. 05/13/16   Sanjuana Kava, NP  gabapentin (NEURONTIN) 100 MG capsule Take 2 capsules (200 mg total) by mouth 2 (two) times daily and at bedtime. For agitation 05/13/16   Sanjuana Kava, NP  ibuprofen (ADVIL,MOTRIN) 800 MG tablet Take 1 tablet (800 mg total) by mouth every 6 (six) hours as needed for fever, headache, mild pain, moderate pain or cramping. 05/13/16   Nicole Kindred  I Nwoko, NP  labetalol (NORMODYNE) 100 MG tablet Take 1 tablet (100 mg) in the morning & 2 tablets (200 mg) in the evening: For high blood pressure 05/13/16   Sanjuana Kava, NP  lithium carbonate 300 MG capsule Take 3 capsules (900 mg total) by mouth at bedtime. For mood stabilization 05/13/16   Sanjuana Kava, NP  paliperidone (INVEGA) 9 MG 24 hr tablet Take 1 tablet (9 mg total) by mouth at bedtime. For mood control 05/13/16   Sanjuana Kava, NP  prazosin (MINIPRESS) 2 MG capsule Take 1 capsule (2 mg total) by mouth at bedtime. For nightmares 05/13/16   Sanjuana Kava, NP  zolpidem (AMBIEN) 10 MG tablet Take 1 tablet (10 mg total) by  mouth at bedtime. For sleep 05/13/16 06/20/16  Sanjuana Kava, NP    Family History Family History  Problem Relation Age of Onset  . Alcoholism    . Leukemia    . Cervical cancer    . Bone cancer    . Cirrhosis    . Diabetes Mellitus II    . Depression    . Hypertension    . Thyroid cancer    . Diabetes Mellitus II Mother   . Hypertension Mother   . Alcoholism Father   . Mental illness Paternal Grandmother     Social History Social History  Substance Use Topics  . Smoking status: Never Smoker  . Smokeless tobacco: Never Used  . Alcohol use No     Comment: 4 40's beer daily- not currently     Allergies   Other and Penicillins   Review of Systems Review of Systems  Unable to perform ROS: Mental status change   Physical Exam Updated Vital Signs BP (!) 146/110 (BP Location: Right Arm)   Pulse (!) 107   Temp 98.5 F (36.9 C) (Oral)   Resp 16   Ht  (1.753 m)   Wt 200 lb (90.7 kg)   LMP 07/23/2015   SpO2 95%   BMI 29.53 kg/m   Physical Exam  Constitutional: She appears well-developed and well-nourished.  HENT:  Head: Normocephalic.  Right Ear: External ear normal.  Left Ear: External ear normal.  Nose: Nose normal.  Mouth/Throat: Oropharynx is clear and moist.  Eyes: Conjunctivae are normal. Right eye exhibits no discharge. Left eye exhibits no discharge.  Neck: Normal range of motion.  Cardiovascular: Regular rhythm and normal heart sounds.  Tachycardia present.   No murmur heard. Pulmonary/Chest: Effort normal and breath sounds normal. No respiratory distress. She has no wheezes. She has no rales.  Abdominal: Soft. She exhibits no distension. There is no tenderness. There is no rebound and no guarding.  Musculoskeletal: Normal range of motion. She exhibits no edema or tenderness.  Neurological: She is alert. No cranial nerve deficit. Coordination normal.  Skin: Skin is warm and dry. No rash noted. No erythema. No pallor.  Psychiatric:  Thought  process is very disorganized. Needs frequent redirection. Otherwise calm and cooperative.   Nursing note and vitals reviewed.  ED Treatments / Results  Labs (all labs ordered are listed, but only abnormal results are displayed) Labs Reviewed - No data to display  EKG  EKG Interpretation None       Radiology No results found.  Procedures Procedures (including critical care time)  DIAGNOSTIC STUDIES: Oxygen Saturation is 95% on RA, adequate by my interpretation.    Medications Ordered in ED Medications - No data to display   Initial  Impression / Assessment and Plan / ED Course  I have reviewed the triage vital signs and the nursing notes.  Pertinent labs & imaging results that were available during my care of the patient were reviewed by me and considered in my medical decision making (see chart for details).     40yF who was brought in by EMS. My understanding is she was found sleeping in her residence during a welfare check with a child in the crib and another child apparently unsupervised. She reports being very tired. She cannot give me a coherent explanation as to why she is in the ER or how she got here. This is concerning and beyond what I would expect from sleep deprivation. I doubt emergent medical condition, but do have significant concerns about her children getting appropriate/safe supervision. Will contact social work.   She is still somewhat disorganized in her thought process and mildly tachycardic, but I doubt emergent medical condition. She has been seen previously for paranoia/psychosis, but not currently showing signs of this. She has no complaints otherwise. I do not feel she requires emergent w/u.   Final Clinical Impressions(s) / ED Diagnoses   Final diagnoses:  Acute confusion    New Prescriptions New Prescriptions   No medications on file   I personally preformed the services scribed in my presence. The recorded information has been reviewed is  accurate. Raeford Razor, MD.    Raeford Razor, MD 07/28/16 1017

## 2016-07-23 ENCOUNTER — Other Ambulatory Visit (HOSPITAL_COMMUNITY): Payer: Self-pay | Admitting: Nurse Practitioner

## 2016-07-24 ENCOUNTER — Other Ambulatory Visit (HOSPITAL_COMMUNITY): Payer: Self-pay | Admitting: Nurse Practitioner

## 2016-07-24 ENCOUNTER — Ambulatory Visit (HOSPITAL_COMMUNITY)
Admission: RE | Admit: 2016-07-24 | Discharge: 2016-07-24 | Disposition: A | Discharge status: Home / Self Care | Payer: Medicaid Other | Source: Ambulatory Visit | Attending: Nurse Practitioner | Admitting: Nurse Practitioner

## 2016-07-24 DIAGNOSIS — Z9889 Other specified postprocedural states: Secondary | ICD-10-CM | POA: Diagnosis not present

## 2016-07-24 DIAGNOSIS — Z1389 Encounter for screening for other disorder: Secondary | ICD-10-CM | POA: Diagnosis not present

## 2016-07-24 DIAGNOSIS — Z0189 Encounter for other specified special examinations: Secondary | ICD-10-CM

## 2016-07-24 DIAGNOSIS — E221 Hyperprolactinemia: Secondary | ICD-10-CM

## 2016-07-31 ENCOUNTER — Ambulatory Visit (HOSPITAL_COMMUNITY)
Admission: RE | Admit: 2016-07-31 | Discharge: 2016-07-31 | Disposition: A | Discharge status: Home / Self Care | Payer: Medicaid Other | Source: Ambulatory Visit | Attending: Nurse Practitioner | Admitting: Nurse Practitioner

## 2016-07-31 DIAGNOSIS — E221 Hyperprolactinemia: Secondary | ICD-10-CM | POA: Insufficient documentation

## 2016-07-31 DIAGNOSIS — G936 Cerebral edema: Secondary | ICD-10-CM | POA: Insufficient documentation

## 2016-07-31 LAB — POCT I-STAT CREATININE: Creatinine, Ser: 0.7 mg/dL (ref 0.44–1.00)

## 2016-07-31 MED ORDER — GADOBENATE DIMEGLUMINE 529 MG/ML IV SOLN
10.0000 mL | Freq: Once | INTRAVENOUS | Status: AC | PRN
  Administered 2016-07-31: 10 mL via INTRAVENOUS

## 2016-09-05 ENCOUNTER — Ambulatory Visit: Payer: Self-pay | Admitting: Obstetrics

## 2016-10-30 ENCOUNTER — Ambulatory Visit: Payer: Medicaid Other | Admitting: Obstetrics

## 2016-12-01 ENCOUNTER — Inpatient Hospital Stay (HOSPITAL_COMMUNITY)
Admission: EM | Admit: 2016-12-01 | Discharge: 2016-12-05 | DRG: 896 | Disposition: A | Discharge status: Home / Self Care | Payer: Medicaid Other | Attending: Internal Medicine | Admitting: Internal Medicine

## 2016-12-01 ENCOUNTER — Encounter (HOSPITAL_COMMUNITY): Payer: Self-pay | Admitting: *Deleted

## 2016-12-01 DIAGNOSIS — E876 Hypokalemia: Secondary | ICD-10-CM | POA: Diagnosis present

## 2016-12-01 DIAGNOSIS — Z880 Allergy status to penicillin: Secondary | ICD-10-CM

## 2016-12-01 DIAGNOSIS — I10 Essential (primary) hypertension: Secondary | ICD-10-CM | POA: Diagnosis present

## 2016-12-01 DIAGNOSIS — Z9889 Other specified postprocedural states: Secondary | ICD-10-CM | POA: Diagnosis not present

## 2016-12-01 DIAGNOSIS — Z8249 Family history of ischemic heart disease and other diseases of the circulatory system: Secondary | ICD-10-CM

## 2016-12-01 DIAGNOSIS — Z811 Family history of alcohol abuse and dependence: Secondary | ICD-10-CM | POA: Diagnosis not present

## 2016-12-01 DIAGNOSIS — F10930 Alcohol use, unspecified with withdrawal, uncomplicated: Secondary | ICD-10-CM

## 2016-12-01 DIAGNOSIS — E860 Dehydration: Secondary | ICD-10-CM | POA: Diagnosis present

## 2016-12-01 DIAGNOSIS — D649 Anemia, unspecified: Secondary | ICD-10-CM | POA: Diagnosis present

## 2016-12-01 DIAGNOSIS — G92 Toxic encephalopathy: Secondary | ICD-10-CM | POA: Diagnosis present

## 2016-12-01 DIAGNOSIS — D72829 Elevated white blood cell count, unspecified: Secondary | ICD-10-CM | POA: Diagnosis present

## 2016-12-01 DIAGNOSIS — F312 Bipolar disorder, current episode manic severe with psychotic features: Secondary | ICD-10-CM | POA: Diagnosis present

## 2016-12-01 DIAGNOSIS — F259 Schizoaffective disorder, unspecified: Secondary | ICD-10-CM | POA: Diagnosis present

## 2016-12-01 DIAGNOSIS — Z833 Family history of diabetes mellitus: Secondary | ICD-10-CM | POA: Diagnosis not present

## 2016-12-01 DIAGNOSIS — Z79899 Other long term (current) drug therapy: Secondary | ICD-10-CM

## 2016-12-01 DIAGNOSIS — F10939 Alcohol use, unspecified with withdrawal, unspecified: Secondary | ICD-10-CM | POA: Diagnosis present

## 2016-12-01 DIAGNOSIS — T50995A Adverse effect of other drugs, medicaments and biological substances, initial encounter: Secondary | ICD-10-CM | POA: Diagnosis present

## 2016-12-01 DIAGNOSIS — R51 Headache: Secondary | ICD-10-CM | POA: Diagnosis not present

## 2016-12-01 DIAGNOSIS — R519 Headache, unspecified: Secondary | ICD-10-CM | POA: Diagnosis present

## 2016-12-01 DIAGNOSIS — F10239 Alcohol dependence with withdrawal, unspecified: Secondary | ICD-10-CM | POA: Diagnosis present

## 2016-12-01 DIAGNOSIS — F319 Bipolar disorder, unspecified: Secondary | ICD-10-CM | POA: Diagnosis not present

## 2016-12-01 DIAGNOSIS — F10230 Alcohol dependence with withdrawal, uncomplicated: Principal | ICD-10-CM | POA: Diagnosis present

## 2016-12-01 LAB — COMPREHENSIVE METABOLIC PANEL
ALK PHOS: 94 U/L (ref 38–126)
ALT: 42 U/L (ref 14–54)
AST: 38 U/L (ref 15–41)
Albumin: 4.2 g/dL (ref 3.5–5.0)
Anion gap: 13 (ref 5–15)
BUN: 11 mg/dL (ref 6–20)
CALCIUM: 9.6 mg/dL (ref 8.9–10.3)
CO2: 23 mmol/L (ref 22–32)
CREATININE: 0.73 mg/dL (ref 0.44–1.00)
Chloride: 101 mmol/L (ref 101–111)
Glucose, Bld: 120 mg/dL — ABNORMAL HIGH (ref 65–99)
Potassium: 3.5 mmol/L (ref 3.5–5.1)
Sodium: 137 mmol/L (ref 135–145)
Total Bilirubin: 0.4 mg/dL (ref 0.3–1.2)
Total Protein: 7.7 g/dL (ref 6.5–8.1)

## 2016-12-01 LAB — ETHANOL: Alcohol, Ethyl (B): <5 mg/dL (ref <5)

## 2016-12-01 LAB — URINALYSIS, ROUTINE W REFLEX MICROSCOPIC
BILIRUBIN URINE: NEGATIVE
Glucose, UA: NEGATIVE mg/dL
Ketones, ur: NEGATIVE mg/dL
LEUKOCYTES UA: NEGATIVE
Nitrite: NEGATIVE
PROTEIN: NEGATIVE mg/dL
SPECIFIC GRAVITY, URINE: 1.005 (ref 1.005–1.030)
pH: 5 (ref 5.0–8.0)

## 2016-12-01 LAB — I-STAT TROPONIN, ED: TROPONIN I, POC: 0 ng/mL (ref 0.00–0.08)

## 2016-12-01 LAB — CBC
HEMATOCRIT: 40.3 % (ref 36.0–46.0)
HEMOGLOBIN: 13.6 g/dL (ref 12.0–15.0)
MCH: 28.1 pg (ref 26.0–34.0)
MCHC: 33.7 g/dL (ref 30.0–36.0)
MCV: 83.3 fL (ref 78.0–100.0)
Platelets: 314 10*3/uL (ref 150–400)
RBC: 4.84 MIL/uL (ref 3.87–5.11)
RDW: 14.3 % (ref 11.5–15.5)
WBC: 15.1 10*3/uL — ABNORMAL HIGH (ref 4.0–10.5)

## 2016-12-01 LAB — RAPID URINE DRUG SCREEN, HOSP PERFORMED
Amphetamines: NOT DETECTED
Barbiturates: NOT DETECTED
Benzodiazepines: NOT DETECTED
COCAINE: NOT DETECTED
OPIATES: NOT DETECTED
TETRAHYDROCANNABINOL: NOT DETECTED

## 2016-12-01 LAB — ACETAMINOPHEN LEVEL: Acetaminophen (Tylenol), Serum: <10 ug/mL — ABNORMAL LOW (ref 10–30)

## 2016-12-01 LAB — LIPASE, BLOOD: LIPASE: 15 U/L (ref 11–51)

## 2016-12-01 MED ORDER — PALIPERIDONE ER 6 MG PO TB24
9.0000 mg | ORAL_TABLET | Freq: Every day | ORAL | Status: DC
  Administered 2016-12-02 – 2016-12-04 (×3): 9 mg via ORAL
  Filled 2016-12-01 (×4): qty 1

## 2016-12-01 MED ORDER — LITHIUM CARBONATE 300 MG PO CAPS
900.0000 mg | ORAL_CAPSULE | Freq: Every day | ORAL | Status: DC
  Administered 2016-12-01 – 2016-12-04 (×4): 900 mg via ORAL
  Filled 2016-12-01 (×5): qty 3

## 2016-12-01 MED ORDER — ONDANSETRON HCL 4 MG/2ML IJ SOLN
4.0000 mg | Freq: Once | INTRAMUSCULAR | Status: AC
  Administered 2016-12-01: 4 mg via INTRAVENOUS
  Filled 2016-12-01: qty 2

## 2016-12-01 MED ORDER — LORAZEPAM 1 MG PO TABS
0.0000 mg | ORAL_TABLET | Freq: Four times a day (QID) | ORAL | Status: AC
  Administered 2016-12-02: 2 mg via ORAL
  Filled 2016-12-01: qty 2

## 2016-12-01 MED ORDER — ONDANSETRON HCL 4 MG/2ML IJ SOLN
4.0000 mg | Freq: Four times a day (QID) | INTRAMUSCULAR | Status: DC | PRN
  Administered 2016-12-04: 4 mg via INTRAVENOUS
  Filled 2016-12-01 (×2): qty 2

## 2016-12-01 MED ORDER — LORAZEPAM 2 MG/ML IJ SOLN
1.0000 mg | Freq: Once | INTRAMUSCULAR | Status: AC
  Administered 2016-12-01: 1 mg via INTRAVENOUS
  Filled 2016-12-01: qty 1

## 2016-12-01 MED ORDER — LORAZEPAM 2 MG/ML IJ SOLN
0.0000 mg | Freq: Four times a day (QID) | INTRAMUSCULAR | Status: AC
  Administered 2016-12-01: 2 mg via INTRAVENOUS
  Administered 2016-12-01 – 2016-12-03 (×5): 4 mg via INTRAVENOUS
  Filled 2016-12-01 (×4): qty 2
  Filled 2016-12-01: qty 1
  Filled 2016-12-01: qty 2

## 2016-12-01 MED ORDER — LABETALOL HCL 100 MG PO TABS
100.0000 mg | ORAL_TABLET | Freq: Two times a day (BID) | ORAL | Status: DC
  Administered 2016-12-01 – 2016-12-05 (×8): 100 mg via ORAL
  Filled 2016-12-01 (×9): qty 1

## 2016-12-01 MED ORDER — PRAZOSIN HCL 1 MG PO CAPS
2.0000 mg | ORAL_CAPSULE | Freq: Every day | ORAL | Status: DC
  Administered 2016-12-01 – 2016-12-04 (×4): 2 mg via ORAL
  Filled 2016-12-01 (×5): qty 2

## 2016-12-01 MED ORDER — ATOMOXETINE HCL 40 MG PO CAPS
80.0000 mg | ORAL_CAPSULE | Freq: Every day | ORAL | Status: DC
  Administered 2016-12-02 – 2016-12-05 (×4): 80 mg via ORAL
  Filled 2016-12-01 (×5): qty 2

## 2016-12-01 MED ORDER — SODIUM CHLORIDE 0.9 % IV BOLUS (SEPSIS)
1000.0000 mL | Freq: Once | INTRAVENOUS | Status: AC
  Administered 2016-12-01: 1000 mL via INTRAVENOUS

## 2016-12-01 MED ORDER — LORAZEPAM 1 MG PO TABS
0.0000 mg | ORAL_TABLET | Freq: Two times a day (BID) | ORAL | Status: DC
  Administered 2016-12-04: 1 mg via ORAL
  Filled 2016-12-01: qty 1

## 2016-12-01 MED ORDER — VITAMIN B-1 100 MG PO TABS
100.0000 mg | ORAL_TABLET | Freq: Every day | ORAL | Status: DC
  Administered 2016-12-02 – 2016-12-05 (×4): 100 mg via ORAL
  Filled 2016-12-01 (×4): qty 1

## 2016-12-01 MED ORDER — BUPRENORPHINE HCL-NALOXONE HCL 8-2 MG SL SUBL
3.0000 | SUBLINGUAL_TABLET | Freq: Every day | SUBLINGUAL | Status: DC
  Administered 2016-12-01 – 2016-12-03 (×3): 3 via SUBLINGUAL
  Filled 2016-12-01 (×3): qty 3
  Filled 2016-12-01: qty 1

## 2016-12-01 MED ORDER — ENOXAPARIN SODIUM 40 MG/0.4ML ~~LOC~~ SOLN
40.0000 mg | Freq: Every day | SUBCUTANEOUS | Status: DC
  Administered 2016-12-01 – 2016-12-04 (×4): 40 mg via SUBCUTANEOUS
  Filled 2016-12-01 (×4): qty 0.4

## 2016-12-01 MED ORDER — THIAMINE HCL 100 MG/ML IJ SOLN
Freq: Once | INTRAVENOUS | Status: AC
  Administered 2016-12-01: via INTRAVENOUS
  Filled 2016-12-01 (×2): qty 1000

## 2016-12-01 MED ORDER — GABAPENTIN 100 MG PO CAPS: 200.0000 mg | ORAL_CAPSULE | ORAL | Status: DC

## 2016-12-01 MED ORDER — THIAMINE HCL 100 MG/ML IJ SOLN
100.0000 mg | Freq: Every day | INTRAMUSCULAR | Status: DC
  Administered 2016-12-01: 100 mg via INTRAVENOUS
  Filled 2016-12-01 (×2): qty 2

## 2016-12-01 MED ORDER — CHLORDIAZEPOXIDE HCL 25 MG PO CAPS
25.0000 mg | ORAL_CAPSULE | Freq: Three times a day (TID) | ORAL | Status: DC
  Administered 2016-12-01 – 2016-12-04 (×8): 25 mg via ORAL
  Filled 2016-12-01 (×8): qty 1

## 2016-12-01 MED ORDER — PANTOPRAZOLE SODIUM 40 MG IV SOLR
40.0000 mg | Freq: Every day | INTRAVENOUS | Status: DC
  Administered 2016-12-01: 40 mg via INTRAVENOUS
  Filled 2016-12-01: qty 40

## 2016-12-01 MED ORDER — BENZTROPINE MESYLATE 0.5 MG PO TABS
1.0000 mg | ORAL_TABLET | Freq: Every day | ORAL | Status: DC
  Administered 2016-12-01 – 2016-12-04 (×4): 1 mg via ORAL
  Filled 2016-12-01: qty 1
  Filled 2016-12-01: qty 2
  Filled 2016-12-01: qty 1
  Filled 2016-12-01 (×2): qty 2

## 2016-12-01 MED ORDER — LORAZEPAM 2 MG/ML IJ SOLN
0.0000 mg | Freq: Two times a day (BID) | INTRAMUSCULAR | Status: DC
  Administered 2016-12-03: 2 mg via INTRAVENOUS

## 2016-12-01 NOTE — ED Triage Notes (Signed)
Pt states she wants to detox from alcohol. Pt recently relapsed and has been drinking 12 shots of liquor a day for a month. Pt last had 1 shot this morning. Pt is also on suboxone.   Pt states she has a hx of withdrawal seizures and was hospitalized 3 years ago for same.

## 2016-12-01 NOTE — ED Provider Notes (Signed)
[facility]-EMERGENCY DEPT Provider Note   CSN: [id number] Arrival date & time: 12/01/16  1718     History   Chief Complaint Chief Complaint  Patient presents with  . Alcohol Problem    HPI Megan Edwards is a 41 y.o. female.  HPI  41 y.o. female with a hx of HTN, Schizoaffective Disorder, presents to the Emergency Department today due to ETOH withdrawal. Pt states that she has a hx of ETOH withdrawal seizures x 3 years ago due to similar detox. Notes that she has been drinking 12 hots of liquor a day since her relapse x 2 months ago. Pt attempted to detox herself this morning with only 1 shot of liquor and has not had any since then. Notes increased blood pressure despite taking home BP medication as well as feelign tremulous. Notes nausea without emesis. Intermittent chest pain. Abdominal pain noted that is generalized with associated diarrhea. Rates pain 8/10. Aching/cramping "all over." No headaches. No blurred vision. Denies substance abuse. No visual or auditory hallucinations. No other symptoms noted.   Past Medical History:  Diagnosis Date  . Abnormal Pap smear   . Bipolar 1 disorder (HCC)   . Depression   . Genital herpes 2000  . Hypertension   . Infection    UTI  . Ovarian cyst   . Schizoaffective disorder (HCC) 05/05/2016  . SVT (supraventricular tachycardia) [facility])     Patient Active Problem List   Diagnosis Date Noted  . ADD (attention deficit disorder) 05/10/2016  . Head ache 05/08/2016  . Opioid use disorder, mild, in controlled environment 05/06/2016  . Essential hypertension 05/06/2016  . Alcohol use disorder, moderate, in early remission, dependence (HCC) 05/06/2016  . Bipolar I disorder, most recent episode manic, severe with psychotic features (HCC) 01/20/2016  . Chronic pain syndrome   . Morbid obesity due to excess calories [facility])     Past Surgical History:  Procedure Laterality Date  . addenoidectomy    . BREAST ENHANCEMENT SURGERY    . BREAST SURGERY     . KNEE SURGERY    . KNEE SURGERY Left   . TONSILLECTOMY    . WISDOM TOOTH EXTRACTION      OB History    Gravida Para Term Preterm AB Living   0 3 2   SAB TAB Ectopic Multiple Live Births   1 2 0 0 2       Home Medications    Prior to Admission medications   Medication Sig Start Date End Date Taking? Authorizing Provider  atomoxetine (STRATTERA) 40 MG capsule Take 1 capsule (40 mg total) by mouth daily. For ADHD 05/14/16   Armandina Stammer I, NP  benztropine (COGENTIN) 1 MG tablet Take 1 tablet (1 mg total) by mouth at bedtime. For prevention drug related tremors. 05/13/16   Armandina Stammer I, NP  buprenorphine-naloxone (SUBOXONE) 8-2 mg SUBL SL tablet Place 3 tablets under the tongue daily.    [provider]  gabapentin (NEURONTIN) 100 MG capsule Take 2 capsules (200 mg total) by mouth 2 (two) times daily and at bedtime. For agitation 05/13/16   Armandina Stammer I, NP  ibuprofen (ADVIL,MOTRIN) 800 MG tablet Take 1 tablet (800 mg total) by mouth every 6 (six) hours as needed for fever, headache, mild pain, moderate pain or cramping. 05/13/16   Armandina Stammer I, NP  labetalol (NORMODYNE) 100 MG tablet Take 1 tablet (100 mg) in the morning & 2 tablets (200 mg) in the evening: For high  blood pressure 05/13/16   Armandina Stammer I, NP  lithium carbonate 300 MG capsule Take 3 capsules (900 mg total) by mouth at bedtime. For mood stabilization 05/13/16   Armandina Stammer I, NP  paliperidone (INVEGA) 9 MG 24 hr tablet Take 1 tablet (9 mg total) by mouth at bedtime. For mood control Patient taking differently: Take 9 mg by mouth every morning. For mood control 05/13/16   Armandina Stammer I, NP  prazosin (MINIPRESS) 2 MG capsule Take 1 capsule (2 mg total) by mouth at bedtime. For nightmares 05/13/16   Armandina Stammer I, NP  QUEtiapine (SEROQUEL) 200 MG tablet Take 200 mg by mouth as needed. 07/03/16   [provider]  zolpidem (AMBIEN) 10 MG tablet Take 1 tablet (10 mg total) by mouth at bedtime. For  sleep 05/13/16 06/20/16  Armandina Stammer I, NP  zolpidem (AMBIEN) 10 MG tablet Take 10 mg by mouth at bedtime. 07/22/16   [provider]    Family History Family History  Problem Relation Age of Onset  . Alcoholism Unknown   . Leukemia Unknown   . Cervical cancer Unknown   . Bone cancer Unknown   . Cirrhosis Unknown   . Diabetes Mellitus II Unknown   . Depression Unknown   . Hypertension Unknown   . Thyroid cancer Unknown   . Diabetes Mellitus II Mother   . Hypertension Mother   . Alcoholism Father   . Mental illness Paternal Grandmother     Social History Social History  Substance Use Topics  . Smoking status: Never Smoker  . Smokeless tobacco: Never Used  . Alcohol use No     Comment: 4 40's beer daily- not currently     Allergies   Other and Penicillins   Review of Systems Review of Systems ROS reviewed and all are negative for acute change except as noted in the HPI.  Physical Exam Updated Vital Signs BP (!) 184/103   Pulse (!) 108   Temp 98.7 F (37.1 C) (Oral)   Resp 18   SpO2 95%   Physical Exam  Constitutional: She is oriented to person, place, and time. Vital signs are normal. She appears well-developed and well-nourished. No distress.  Pt tremulous  HENT:  Head: Normocephalic and atraumatic.  Right Ear: Hearing, tympanic membrane, external ear and ear canal normal.  Left Ear: Hearing, tympanic membrane, external ear and ear canal normal.  Nose: Nose normal.  Mouth/Throat: Uvula is midline, oropharynx is clear and moist and mucous membranes are normal. No trismus in the jaw. No oropharyngeal exudate, posterior oropharyngeal erythema or tonsillar abscesses.  Eyes: Pupils are equal, round, and reactive to light. Conjunctivae and EOM are normal.  Neck: Normal range of motion. Neck supple. No tracheal deviation present.  Cardiovascular: Regular rhythm, S1 normal, S2 normal, normal heart sounds, intact distal pulses and normal pulses.  Tachycardia  present.   Pulmonary/Chest: Effort normal and breath sounds normal. No respiratory distress. She has no decreased breath sounds. She has no wheezes. She has no rhonchi. She has no rales.  Abdominal: Normal appearance and bowel sounds are normal. There is no tenderness.  Musculoskeletal: Normal range of motion.  Neurological: She is alert and oriented to person, place, and time.  Skin: Skin is warm and dry.  Psychiatric: She has a normal mood and affect. Her speech is normal and behavior is normal. Thought content normal.  Nursing note and vitals reviewed.    ED Treatments / Results  Labs (all labs ordered  are listed, but only abnormal results are displayed) Labs Reviewed  CBC - Abnormal; Notable for the following:       Result Value   WBC 15.1 (*)    All other components within normal limits  COMPREHENSIVE METABOLIC PANEL - Abnormal; Notable for the following:    Glucose, Bld 120 (*)    All other components within normal limits  ACETAMINOPHEN LEVEL - Abnormal; Notable for the following:    Acetaminophen (Tylenol), Serum <10 (*)    All other components within normal limits  LIPASE, BLOOD  ETHANOL  URINALYSIS, ROUTINE W REFLEX MICROSCOPIC  RAPID URINE DRUG SCREEN, HOSP PERFORMED  I-STAT TROPONIN, ED    EKG  EKG Interpretation None       Radiology No results found.  Procedures Procedures (including critical care time)  Medications Ordered in ED Medications  LORazepam (ATIVAN) injection 0-4 mg (4 mg Intravenous Given 12/01/16 1850)    Or  LORazepam (ATIVAN) tablet 0-4 mg ( Oral See Alternative 12/01/16 1850)  LORazepam (ATIVAN) injection 0-4 mg (not administered)    Or  LORazepam (ATIVAN) tablet 0-4 mg (not administered)  thiamine (VITAMIN B-1) tablet 100 mg ( Oral See Alternative 12/01/16 1852)    Or  thiamine (B-1) injection 100 mg (100 mg Intravenous Given 12/01/16 1852)  sodium chloride 0.9 % bolus 1,000 mL (0 mLs Intravenous Stopped 12/01/16 2021)  ondansetron (ZOFRAN)  injection 4 mg (4 mg Intravenous Given 12/01/16 1939)  LORazepam (ATIVAN) injection 1 mg (1 mg Intravenous Given 12/01/16 2032)     Initial Impression / Assessment and Plan / ED Course  I have reviewed the triage vital signs and the nursing notes.  Pertinent labs & imaging results that were available during my care of the patient were reviewed by me and considered in my medical decision making (see chart for details).  Final Clinical Impressions(s) / ED Diagnoses  {I have reviewed and evaluated the relevant laboratory values.  {I have interpreted the relevant EKG. {I have reviewed the relevant previous healthcare records.  {I obtained HPI from historian. {Patient discussed with supervising physician.  ED Course:  Assessment: Pt is a 41 y.o. female with a hx of HTN, Schizoaffective Disorder, presents to the Emergency Department today due to ETOH withdrawal. Pt states that she has a hx of ETOH withdrawal seizures x 3 years ago due to similar detox. Notes that she has been drinking 12 hots of liquor a day since her relapse x 2 months ago. Pt attempted to detox herself this morning with only 1 shot of liquor and has not had any since then. Notes increased blood pressure despite taking home BP medication as well as feelign tremulous. Notes nausea without emesis. Intermittent chest pain. Abdominal pain noted that is generalized with associated diarrhea. Rates pain 8/10. Aching/cramping "all over." No headaches. No blurred vision. Denies substance abuse. No visual or auditory hallucinations. On exam, pt tremulous. Nontoxic/nonseptic appearing. VS with tachycardia and hypertensive. Afebrile. Lungs CTA. Heart RRR. Abdomen nontender soft. CBC mild leukocytosis. CMP unermarkable. Trop negative. EKG unremarkable. ETOH level undetectable. Given ativan per CIWA protocol in ED (Total of ). Pt symptoms consistent with ETOH withdrawal. Concern for withdrawal seizures. Plan is to Admit. Pt adamant and requesting detox.    Disposition/Plan:  Admit Pt acknowledges and agrees with plan  Supervising Physician Gerhard Munch, MD  Final diagnoses:  Alcohol withdrawal syndrome without complication [facility])    New Prescriptions New Prescriptions   No medications on file     Britiney Blahnik,  Filomena Junglingyler, PA-C 12/01/16 2042    Gerhard MunchLockwood, Robert, MD 12/01/16 [phone]

## 2016-12-01 NOTE — H&P (Signed)
History and Physical  Megan Edwards WUJ:[id number] DOB: Aug 20, 1975 DOA: 12/01/2016  PCP:  Gilda CreasePavelock, Richard M, MD   Chief Complaint:  Alcohol withdrawal   History of Present Illness:  Pt is a 41 yo female with hx of alcoholism, polysubstance abuse who came with cc of alcohol withdrawal for the past 24 hours with tremors, anxiety, palpitations, nausea, vomiting, epigastric discomfort, diarrhea, chest tightness. She was sober for 2 years then relapsed 2 months ago , taking 12 shots of hard liquor daily with last one being 2 days ago except that this morning she took a single shot when she felt withdrawal symptoms are worsening but that did not help much. Otherwise no complaints. But in ROS she reports mil dysuria/polyuria.    Review of Systems:  CONSTITUTIONAL:     No night sweats.  No fatigue.  No fever. No chills. Eyes:                            No visual changes.  No eye pain.  No eye discharge.   ENT:                              No epistaxis.  No sinus pain.  No sore throat.   No congestion. RESPIRATORY:           +cough.  No wheeze.  No hemoptysis.  No dyspnea CARDIOVASCULAR   :  +chest pains.  +palpitations. GASTROINTESTINAL:  +abdominal pain.  +nausea. +vomiting.  +diarrhea. No   constipation.  No hematemesis.  No hematochezia.  No melena. GENITOURINARY:      No urgency.  No frequency.  +dysuria.  No hematuria.  No    obstructive symptoms.  No discharge.  No pain.   MUSCULOSKELETAL:  No musculoskeletal pain.  No joint swelling.  No arthritis. NEUROLOGICAL:        No confusion.  No weakness. No headache. No seizure. PSYCHIATRIC:             No depression. +anxiety. No suicidal ideation. SKIN:                             No rashes.  No lesions.  No wounds. ENDOCRINE:                No weight loss.  No polydipsia.  +polyuria.  No polyphagia. HEMATOLOGIC:           No purpura.  No petechiae.  No bleeding.  ALLERGIC                 : No pruritus.  No  angioedema Other:  Past Medical and Surgical History:   Past Medical History:  Diagnosis Date  . Abnormal Pap smear   . Bipolar 1 disorder (HCC)   . Depression   . Genital herpes 2000  . Hypertension   . Infection    UTI  . Ovarian cyst   . Schizoaffective disorder (HCC) 05/05/2016  . SVT (supraventricular tachycardia) (HCC)    Past Surgical History:  Procedure Laterality Date  . addenoidectomy    . BREAST ENHANCEMENT SURGERY    . BREAST SURGERY    . KNEE SURGERY    . KNEE SURGERY Left   . TONSILLECTOMY    . WISDOM TOOTH EXTRACTION      Social History:   reports that she  has never smoked. She has never used smokeless tobacco. She reports that she uses drugs. She reports that she does not drink alcohol.    Allergies  Allergen Reactions  . Other Other (See Comments)    Pt does not take narcotics, is on Suboxone.   Marland Kitchen Penicillins Rash and Other (See Comments)    Has patient had a PCN reaction causing immediate rash, facial/tongue/throat swelling, SOB or lightheadedness with hypotension: Yes Has patient had a PCN reaction causing severe rash involving mucus membranes or skin necrosis: No Has patient had a PCN reaction that required hospitalization No Has patient had a PCN reaction occurring within the last 10 years: No If all of the above answers are "NO", then may proceed with Cephalosporin use.    Family History  Problem Relation Age of Onset  . Alcoholism Unknown   . Leukemia Unknown   . Cervical cancer Unknown   . Bone cancer Unknown   . Cirrhosis Unknown   . Diabetes Mellitus II Unknown   . Depression Unknown   . Hypertension Unknown   . Thyroid cancer Unknown   . Diabetes Mellitus II Mother   . Hypertension Mother   . Alcoholism Father   . Mental illness Paternal Grandmother       Prior to Admission medications   Medication Sig Start Date End Date Taking? Authorizing Provider  atomoxetine (STRATTERA) 80 MG capsule Take 80 mg by mouth daily. 11/06/16   Yes [provider]  benztropine (COGENTIN) 1 MG tablet Take 1 tablet (1 mg total) by mouth at bedtime. For prevention drug related tremors. 05/13/16  Yes Armandina Stammer I, NP  buprenorphine-naloxone (SUBOXONE) 8-2 mg SUBL SL tablet Place 2 tablets under the tongue daily.    Yes [provider]  gabapentin (NEURONTIN) 100 MG capsule Take 2 capsules (200 mg total) by mouth 2 (two) times daily and at bedtime. For agitation 05/13/16  Yes Nwoko, Nicole Kindred I, NP  labetalol (NORMODYNE) 100 MG tablet Take 1 tablet (100 mg) in the morning & 2 tablets (200 mg) in the evening: For high blood pressure 05/13/16  Yes Nwoko, Nicole Kindred I, NP  lithium carbonate 300 MG capsule Take 3 capsules (900 mg total) by mouth at bedtime. For mood stabilization 05/13/16  Yes Nwoko, Nicole Kindred I, NP  paliperidone (INVEGA) 9 MG 24 hr tablet Take 1 tablet (9 mg total) by mouth at bedtime. For mood control 05/13/16  Yes Nwoko, Nicole Kindred I, NP  prazosin (MINIPRESS) 2 MG capsule Take 1 capsule (2 mg total) by mouth at bedtime. For nightmares 05/13/16  Yes Armandina Stammer I, NP  QUEtiapine (SEROQUEL) 200 MG tablet Take 200 mg by mouth as needed. 07/03/16  Yes [provider]  traZODone (DESYREL) 50 MG tablet Take 50 mg by mouth at bedtime. 11/04/16  Yes [provider]  zolpidem (AMBIEN) 10 MG tablet Take 1 tablet (10 mg total) by mouth at bedtime. For sleep 05/13/16 12/01/16 Yes Armandina Stammer I, NP    Physical Exam: BP (!) 149/101   Pulse 98   Temp 98.7 F (37.1 C) (Oral)   Resp 13   SpO2 98%   GENERAL :   Alert and cooperative, and appears to be in mild acute distress. HEAD:           normocephalic. EYES:            PERRL, EOMI.   EARS:           hearing grossly intact. NOSE:  No nasal discharge. NECK:          supple CARDIAC:    Normal S1 and S2. No gallop. No murmurs.  Vascular:     no peripheral edema.  LUNGS:       Clear to auscultation  ABDOMEN: Positive bowel sounds. Soft, nondistended, mild epigastric  tenderness. No guarding or rebound.      MSK:           No joint erythema or tenderness.  EXT           : No significant deformity or joint abnormality. Neuro        : Alert, oriented to person, place, and time.                      CN II-XII intact.  SKIN:            No rash. No lesions. PSYCH:       No hallucination. Patient is not suicidal.          Labs on Admission:  Reviewed.   Radiological Exams on Admission: No results found.  EKG:  Independently reviewed. NSR  Assessment/Plan  Alcohol withdrawal: Admit to stepdown, started on CIWA protocol / ativan prn  Started on librium 25 mg TID Banana bag at 100 cc/hr  Leukocytosis: could be dehydration , will check UA for possible UTI.   Chest tightness; likely due to above, will check UDS and repeat another trop, EKG was with NSR.   N/V/abd pain: possibly gastritis from alcohol   Alcoholism: psych eval in am  schizoaffective disorder: cont home meds Input & Output: NA Lines & Tubes: PIV DVT prophylaxis: [place] enoxaparin  GI prophylaxis: PPI Consultants: psych in am Code Status: full  Family Communication: none at bedside  Disposition Plan: TBD    Eston Esters M.D [facility]

## 2016-12-02 DIAGNOSIS — F319 Bipolar disorder, unspecified: Secondary | ICD-10-CM

## 2016-12-02 DIAGNOSIS — E876 Hypokalemia: Secondary | ICD-10-CM

## 2016-12-02 DIAGNOSIS — D649 Anemia, unspecified: Secondary | ICD-10-CM

## 2016-12-02 DIAGNOSIS — I10 Essential (primary) hypertension: Secondary | ICD-10-CM

## 2016-12-02 LAB — CBC
HCT: 34.9 % — ABNORMAL LOW (ref 36.0–46.0)
Hemoglobin: 11.6 g/dL — ABNORMAL LOW (ref 12.0–15.0)
MCH: 27.6 pg (ref 26.0–34.0)
MCHC: 33.2 g/dL (ref 30.0–36.0)
MCV: 83.1 fL (ref 78.0–100.0)
PLATELETS: 223 10*3/uL (ref 150–400)
RBC: 4.2 MIL/uL (ref 3.87–5.11)
RDW: 14.3 % (ref 11.5–15.5)
WBC: 10.3 10*3/uL (ref 4.0–10.5)

## 2016-12-02 LAB — COMPREHENSIVE METABOLIC PANEL
ALT: 35 U/L (ref 14–54)
AST: 29 U/L (ref 15–41)
Albumin: 3.8 g/dL (ref 3.5–5.0)
Alkaline Phosphatase: 79 U/L (ref 38–126)
Anion gap: 9 (ref 5–15)
BUN: 10 mg/dL (ref 6–20)
CHLORIDE: 104 mmol/L (ref 101–111)
CO2: 25 mmol/L (ref 22–32)
CREATININE: 0.72 mg/dL (ref 0.44–1.00)
Calcium: 8.5 mg/dL — ABNORMAL LOW (ref 8.9–10.3)
GFR calc Af Amer: >60 mL/min (ref >60)
Glucose, Bld: 99 mg/dL (ref 65–99)
POTASSIUM: 3.3 mmol/L — AB (ref 3.5–5.1)
Sodium: 138 mmol/L (ref 135–145)
TOTAL PROTEIN: 6.6 g/dL (ref 6.5–8.1)
Total Bilirubin: 0.3 mg/dL (ref 0.3–1.2)

## 2016-12-02 LAB — MAGNESIUM: MAGNESIUM: 1.7 mg/dL (ref 1.7–2.4)

## 2016-12-02 LAB — HIV ANTIBODY (ROUTINE TESTING W REFLEX): HIV Screen 4th Generation wRfx: NONREACTIVE

## 2016-12-02 LAB — TROPONIN I

## 2016-12-02 LAB — MRSA PCR SCREENING: MRSA BY PCR: NEGATIVE

## 2016-12-02 LAB — LITHIUM LEVEL: LITHIUM LVL: 0.38 mmol/L — AB (ref 0.60–1.20)

## 2016-12-02 MED ORDER — MAGNESIUM SULFATE IN D5W 1-5 GM/100ML-% IV SOLN
1.0000 g | Freq: Once | INTRAVENOUS | Status: AC
  Administered 2016-12-02: 1 g via INTRAVENOUS
  Filled 2016-12-02: qty 100

## 2016-12-02 MED ORDER — HYDRALAZINE HCL 20 MG/ML IJ SOLN
10.0000 mg | Freq: Four times a day (QID) | INTRAMUSCULAR | Status: DC | PRN
  Administered 2016-12-02: 10 mg via INTRAVENOUS
  Filled 2016-12-02: qty 1

## 2016-12-02 MED ORDER — PANTOPRAZOLE SODIUM 40 MG PO TBEC
40.0000 mg | DELAYED_RELEASE_TABLET | Freq: Every day | ORAL | Status: DC
  Administered 2016-12-02 – 2016-12-05 (×4): 40 mg via ORAL
  Filled 2016-12-02 (×4): qty 1

## 2016-12-02 MED ORDER — POTASSIUM CHLORIDE CRYS ER 20 MEQ PO TBCR
40.0000 meq | EXTENDED_RELEASE_TABLET | Freq: Once | ORAL | Status: AC
  Administered 2016-12-02: 40 meq via ORAL
  Filled 2016-12-02: qty 2

## 2016-12-02 MED ORDER — LORAZEPAM 2 MG/ML IJ SOLN
2.0000 mg | Freq: Once | INTRAMUSCULAR | Status: AC
  Administered 2016-12-02: 2 mg via INTRAVENOUS
  Filled 2016-12-02: qty 1

## 2016-12-02 MED ORDER — HALOPERIDOL LACTATE 5 MG/ML IJ SOLN
2.5000 mg | Freq: Four times a day (QID) | INTRAMUSCULAR | Status: DC | PRN
  Administered 2016-12-02 – 2016-12-03 (×3): 2.5 mg via INTRAVENOUS
  Filled 2016-12-02 (×3): qty 1

## 2016-12-02 NOTE — Progress Notes (Signed)
PHARMACIST - PHYSICIAN COMMUNICATION  DR:   Rito EhrlichKrishnan  CONCERNING: IV to Oral Route Change Policy  RECOMMENDATION: This patient is receiving Thiamine & Protonix by the intravenous route.  Based on criteria approved by the Pharmacy and Therapeutics Committee, the intravenous medication(s) is/are being converted to the equivalent oral dose form(s).   DESCRIPTION: These criteria include:  The patient is eating (either orally or via tube) and/or has been taking other orally administered medications for a least 24 hours  The patient has no evidence of active gastrointestinal bleeding or impaired GI absorption (gastrectomy, short bowel, patient on TNA or NPO).  If you have questions about this conversion, please contact the Pharmacy Department  []   [phone]( [phone] )  [facility] []   [phone] )  [facility] []   [phone]( [phone] )  [facility] []   [phone]( [phone] )  [facility] [x]   [phone]( [phone] )  [facility]   Emily FilbertLilliston, [facility] Head ParkMichelle, [facility] CharlesRPH 12/02/2016 10:01 AM

## 2016-12-02 NOTE — Care Management Note (Signed)
Case Management Note  Patient Details  Name: Megan Edwards MRN: [id number] Date of Birth: 1975-05-02  Subjective/Objective:                  etoh w/d  Action/Plan: Date:  December 02, 2016 Chart reviewed for concurrent status and case management needs. Will continue to follow patient progress. Discharge Planning: following for needs Expected discharge date: [id number] Marcelle SmilingRhonda Vina Byrd, BSN, [place]3, [place]M   [phone]  Expected Discharge Date:                  Expected Discharge Plan:  Home/Self Care  In-House Referral:  Clinical Social Work  Discharge planning Services  CM Consult  Post Acute Care Choice:    Choice offered to:     DME Arranged:    DME Agency:     HH Arranged:    HH Agency:     Status of Service:  In process, will continue to follow  If discussed at Long Length of Stay Meetings, dates discussed:    Additional Comments:  Golda AcreDavis, Stormi Vandevelde Lynn, RN 12/02/2016, 8:56 AM

## 2016-12-02 NOTE — Progress Notes (Signed)
[facility] PROGRESS NOTE  Megan Edwards BJY:[id number] DOB: 1975-07-22 DOA: 12/01/2016  PCP: Gilda Crease, MD  Brief History/Interval Summary: 41 year old Caucasian female with a past medical history of alcoholism, polysubstance abuse, presented with complaints of tremors, anxiety, palpitations. She felt as if she was in alcohol withdrawal. Apparently had been sober for 2 years and then relapsed 2 months ago, drinking 12 shots of hard liquor daily. Last intake was about 24-40 hrs prior to admission. Patient was hospitalized for further management.  Reason for Visit: Alcohol withdrawal syndrome  Consultants: None  Procedures: None  Antibiotics: None  Subjective/Interval History: Patient continues to feel tremulous. Denies any vomiting but has been somewhat nauseated. Denies any abdominal pain.  ROS: No chest pain or shortness of breath.  Objective:  Vital Signs  Vitals:   12/02/16 0330 12/02/16 0450 12/02/16 0800 12/02/16 0950  BP:  (!) 166/98 (!) 183/155 (!) 186/108  Pulse:   90 86  Resp:  13 14   Temp: 98.1 F (36.7 C)  98.6 F (37 C)   TempSrc: Oral  Oral   SpO2:   100%   Weight:      Height:        Intake/Output Summary (Last 24 hours) at 12/02/16 1053 Last data filed at 12/02/16 0910  Gross per 24 hour  Intake             1100 ml  Output              500 ml  Net              600 ml   Filed Weights   12/01/16 2236  Weight: 108.1 kg (238 lb 5.1 oz)    General appearance: alert, cooperative, appears stated age and Anxious Head: Normocephalic, without obvious abnormality, atraumatic Resp: Clear to auscultation bilaterally. Cardio: S1, S2 is normal. Regular. No S3, S4.  GI: soft, non-tender; bowel sounds normal; no masses,  no organomegaly Extremities: No significant edema noted. Neurologic: Awake, alert. Seems anxious. Oriented 3. No focal neurological deficits.  Lab Results:  Data Reviewed: I have personally reviewed following labs  and imaging studies  CBC:  Recent Labs Lab 12/01/16 1838 12/02/16 0332  WBC 15.1* 10.3  HGB 13.6 11.6*  HCT 40.3 34.9*  MCV 83.3 83.1  PLT 314 223    Basic Metabolic Panel:  Recent Labs Lab 12/01/16 1838 12/02/16 0332  NA 137 138  K 3.5 3.3*  CL 101 104  CO2 23 25  GLUCOSE 120* 99  BUN 11 10  CREATININE 0.73 0.72  CALCIUM 9.6 8.5*  MG  --  1.7    GFR: Estimated Creatinine Clearance: 117.2 mL/min (by C-G formula based on SCr of 0.72 mg/dL).  Liver Function Tests:  Recent Labs Lab 12/01/16 1838 12/02/16 0332  AST 38 29  ALT 42 35  ALKPHOS 94 79  BILITOT 0.4 0.3  PROT 7.7 6.6  ALBUMIN 4.2 3.8     Recent Labs Lab 12/01/16 1838  LIPASE 15    Cardiac Enzymes:  Recent Labs Lab 12/02/16 0332  TROPONINI <0.03     Recent Results (from the past 240 hour(s))  MRSA PCR Screening     Status: None   Collection Time: 12/01/16 10:41 PM  Result Value Ref Range Status   MRSA by PCR NEGATIVE NEGATIVE Final    Comment:        The GeneXpert MRSA Assay (FDA approved for NASAL specimens only), is one component of a  comprehensive MRSA colonization surveillance program. It is not intended to diagnose MRSA infection nor to guide or monitor treatment for MRSA infections.       Radiology Studies: No results found.   Medications:  Scheduled: . atomoxetine  80 mg Oral Daily  . benztropine  1 mg Oral QHS  . buprenorphine-naloxone  3 tablet Sublingual Daily  . chlordiazePOXIDE  25 mg Oral TID  . enoxaparin (LOVENOX) injection  40 mg Subcutaneous QHS  . labetalol  100 mg Oral BID  . lithium carbonate  900 mg Oral QHS  . LORazepam  0-4 mg Intravenous Q6H   Or  . LORazepam  0-4 mg Oral Q6H  . [START ON 12/04/2016] LORazepam  0-4 mg Intravenous Q12H   Or  . [START ON 12/04/2016] LORazepam  0-4 mg Oral Q12H  . paliperidone  9 mg Oral Daily  . pantoprazole  40 mg Oral Daily  . prazosin  2 mg Oral QHS  . thiamine  100 mg Oral Daily    Continuous:  ZOX:[id number]:hydrALAZINE, ondansetron (ZOFRAN) IV  Assessment/Plan:  Active Problems:   Alcohol withdrawal (HCC)    Alcohol withdrawal syndrome Patient is mildly tremulous but symptoms otherwise appear to be reasonably well controlled. Continue CIWA protocol. Continue banana bag. Thiamine, multivitamins. Social worker consult.  Leukocytosis. Likely due to dehydration. Normal this morning. She is afebrile. UA was unremarkable.  Mild hypokalemia. This will be repleted. Magnesium 1.7.  Nausea, vomiting, abdominal discomfort. Possibly from gastritis. Symptoms appear to have improved this morning. Abdomen is benign. LFTs are normal. Lipase was normal. Continue PPI for now.  History of bipolar disorder. Continue with her home medications. She is noted to be on lithium. Check level.  Essential hypertension. Blood pressure seems to be poorly controlled. Continue Labetalol. Add hydralazine as needed. Some of the blood pressure elevation is from alcohol withdrawal.  Normocytic anemia. Drop in hemoglobin is likely due to dilution. No evidence for overt bleeding. Continue to monitor.  DVT Prophylaxis: Lovenox    Code Status: Full code  Family Communication: Discussed with the patient  Disposition Plan: Management as outlined above. Anticipate transfer out of stepdown unit later today.    LOS: 1 day   [facility] CenterKRISHNAN,Jakwon Gayton  [facility] Pager [phone] 12/02/2016, 10:53 AM  If 7PM-7AM, please contact night-coverage at [email or web address], password [email or web address]

## 2016-12-03 ENCOUNTER — Inpatient Hospital Stay (HOSPITAL_COMMUNITY): Payer: Medicaid Other

## 2016-12-03 ENCOUNTER — Encounter (HOSPITAL_COMMUNITY): Payer: Self-pay | Admitting: Radiology

## 2016-12-03 DIAGNOSIS — R51 Headache: Secondary | ICD-10-CM

## 2016-12-03 LAB — CBC
HEMATOCRIT: 37.8 % (ref 36.0–46.0)
HEMOGLOBIN: 12.3 g/dL (ref 12.0–15.0)
MCH: 28 pg (ref 26.0–34.0)
MCHC: 32.5 g/dL (ref 30.0–36.0)
MCV: 85.9 fL (ref 78.0–100.0)
Platelets: 179 10*3/uL (ref 150–400)
RBC: 4.4 MIL/uL (ref 3.87–5.11)
RDW: 14.9 % (ref 11.5–15.5)
WBC: 10.7 10*3/uL — AB (ref 4.0–10.5)

## 2016-12-03 LAB — BASIC METABOLIC PANEL
ANION GAP: 8 (ref 5–15)
BUN: 7 mg/dL (ref 6–20)
CALCIUM: 9 mg/dL (ref 8.9–10.3)
CHLORIDE: 97 mmol/L — AB (ref 101–111)
CO2: 30 mmol/L (ref 22–32)
Creatinine, Ser: 0.77 mg/dL (ref 0.44–1.00)
GFR calc non Af Amer: >60 mL/min (ref >60)
Glucose, Bld: 135 mg/dL — ABNORMAL HIGH (ref 65–99)
POTASSIUM: 4.2 mmol/L (ref 3.5–5.1)
Sodium: 135 mmol/L (ref 135–145)

## 2016-12-03 MED ORDER — FLUTICASONE PROPIONATE 50 MCG/ACT NA SUSP
2.0000 | Freq: Every day | NASAL | Status: DC
  Administered 2016-12-03 – 2016-12-04 (×2): 2 via NASAL
  Filled 2016-12-03: qty 16

## 2016-12-03 NOTE — Progress Notes (Signed)
Received pt to floor from ICU by wheelchair. Awake, alert and responding to questions. Put to bed and fell asleep after assessment and orientation to room. Agree with previous assessment. Will continue to monitor. Melton Alarana A Haevyn Ury, RN

## 2016-12-03 NOTE — Progress Notes (Signed)
[facility] PROGRESS NOTE  Megan Edwards UJW:[id number] DOB: 05-28-1975 DOA: 12/01/2016  PCP: Gilda CreasePavelock, Richard M, MD  Brief History/Interval Summary: 41 year old Caucasian female with a past medical history of alcoholism, polysubstance abuse, presented with complaints of tremors, anxiety, palpitations. She felt as if she was in alcohol withdrawal. Apparently had been sober for 2 years and then relapsed 2 months ago, drinking 12 shots of hard liquor daily. Last intake was about 24-48 hrs prior to admission. Patient was hospitalized for further management.  Reason for Visit: Alcohol withdrawal syndrome  Consultants: None  Procedures: None  Antibiotics: None  Subjective/Interval History: Patient complains of headache in the front part of her head. Denies any visual disturbances. No tingling or numbness. Otherwise, she is starting to feel better. Some nausea but no vomiting. Denies any abdominal pain.   ROS: No chest pain or shortness of breath  Objective:  Vital Signs  Vitals:   12/03/16 0000 12/03/16 0319 12/03/16 0400 12/03/16 0500  BP: (!) 191/113  (!) 166/84 (!) 158/75  Pulse:      Resp: (!) 24  13 18   Temp:  99.6 F (37.6 C)    TempSrc:  Oral    SpO2:      Weight:      Height:        Intake/Output Summary (Last 24 hours) at 12/03/16 0730 Last data filed at 12/02/16 1950  Gross per 24 hour  Intake              100 ml  Output             1200 ml  Net            -1100 ml   Filed Weights   12/01/16 2236  Weight: 108.1 kg (238 lb 5.1 oz)    General appearance: Awake, alert. In no distress Resp: Clear to auscultation bilaterally Cardio: S1, S2 is normal, regular, mildly tachycardic. No S3, S4. No rubs, murmurs, or bruit GI: Abdomen is soft. Nontender, nondistended. Bowel sounds are present. No masses or organomegaly Extremities: No edema Neurologic: Awake, alert. Slightly anxious. Oriented 3. No focal neurological deficits.  Lab Results:  Data  Reviewed: I have personally reviewed following labs and imaging studies  CBC:  Recent Labs Lab 12/01/16 1838 12/02/16 0332 12/03/16 0332  WBC 15.1* 10.3 10.7*  HGB 13.6 11.6* 12.3  HCT 40.3 34.9* 37.8  MCV 83.3 83.1 85.9  PLT 314 223 179    Basic Metabolic Panel:  Recent Labs Lab 12/01/16 1838 12/02/16 0332 12/03/16 0332  NA 137 138 135  K 3.5 3.3* 4.2  CL 101 104 97*  CO2 23 25 30   GLUCOSE 120* 99 135*  BUN 11 10 7   CREATININE 0.73 0.72 0.77  CALCIUM 9.6 8.5* 9.0  MG  --  1.7  --     GFR: Estimated Creatinine Clearance: 117.2 mL/min (by C-G formula based on SCr of 0.77 mg/dL).  Liver Function Tests:  Recent Labs Lab 12/01/16 1838 12/02/16 0332  AST 38 29  ALT 42 35  ALKPHOS 94 79  BILITOT 0.4 0.3  PROT 7.7 6.6  ALBUMIN 4.2 3.8     Recent Labs Lab 12/01/16 1838  LIPASE 15    Cardiac Enzymes:  Recent Labs Lab 12/02/16 0332  TROPONINI <0.03     Recent Results (from the past 240 hour(s))  MRSA PCR Screening     Status: None   Collection Time: 12/01/16 10:41 PM  Result Value Ref Range Status  MRSA by PCR NEGATIVE NEGATIVE Final    Comment:        The GeneXpert MRSA Assay (FDA approved for NASAL specimens only), is one component of a comprehensive MRSA colonization surveillance program. It is not intended to diagnose MRSA infection nor to guide or monitor treatment for MRSA infections.       Radiology Studies: No results found.   Medications:  Scheduled: . atomoxetine  80 mg Oral Daily  . benztropine  1 mg Oral QHS  . buprenorphine-naloxone  3 tablet Sublingual Daily  . chlordiazePOXIDE  25 mg Oral TID  . enoxaparin (LOVENOX) injection  40 mg Subcutaneous QHS  . fluticasone  2 spray Each Nare Daily  . labetalol  100 mg Oral BID  . lithium carbonate  900 mg Oral QHS  . LORazepam  0-4 mg Intravenous Q6H   Or  . LORazepam  0-4 mg Oral Q6H  . [START ON 12/04/2016] LORazepam  0-4 mg Intravenous Q12H   Or  . [START ON  12/04/2016] LORazepam  0-4 mg Oral Q12H  . paliperidone  9 mg Oral Daily  . pantoprazole  40 mg Oral Daily  . prazosin  2 mg Oral QHS  . thiamine  100 mg Oral Daily   Continuous:  JXB:[id number] lactate, hydrALAZINE, ondansetron (ZOFRAN) IV  Assessment/Plan:  Active Problems:   Alcohol withdrawal (HCC)    Alcohol withdrawal syndrome Patient seems to be improving slowly. Occasional tachycardia is present. Continue CIWA protocol. Thiamine and multivitamins. Social worker consult. She'll be okay for transfer to the floor.  Headache. Previous records reviewed. It appears that this has been an ongoing problem for the patient. She underwent CT scan of head this morning, which was unremarkable. She also was noted to have had a MRI of the brain in May, which also did not show anything concerning. Since headache is frontal and there was some sinus abnormalities noted on previous imaging studies, we will prescribe her Flonase nasal spray.  Leukocytosis. Likely due to dehydration. She is afebrile. UA was unremarkable. The CBC has improved.  Mild hypokalemia. Potassium is normal this morning.   Nausea, vomiting, abdominal discomfort. Possibly from gastritis. Symptoms have improved. Continue PPI. LFTs and lipase levels were normal. Abdomen is benign on examination.   History of bipolar disorder. Continue with her home medications. She is noted to be on lithium. Lithium level 0.38.  Essential hypertension. Blood pressure seems to be poorly controlled. Continue Labetalol. Add hydralazine as needed. Some of the blood pressure elevation is from alcohol withdrawal. Better controlled this morning.  Normocytic anemia. Drop in hemoglobin is likely due to dilution. No evidence for overt bleeding. Continue to monitor.  DVT Prophylaxis: Lovenox    Code Status: Full code  Family Communication: Discussed with the patient  Disposition Plan: Management as outlined above. Okay for transfer to  telemetry. Start mobilizing.    LOS: 2 days   [facility]  [facility] Pager [phone] 12/03/2016, 7:30 AM  If 7PM-7AM, please contact night-coverage at [email or web address], password [facility]

## 2016-12-04 DIAGNOSIS — F10230 Alcohol dependence with withdrawal, uncomplicated: Principal | ICD-10-CM

## 2016-12-04 LAB — BASIC METABOLIC PANEL
Anion gap: 8 (ref 5–15)
BUN: 9 mg/dL (ref 6–20)
CALCIUM: 9.1 mg/dL (ref 8.9–10.3)
CHLORIDE: 95 mmol/L — AB (ref 101–111)
CO2: 32 mmol/L (ref 22–32)
CREATININE: 0.61 mg/dL (ref 0.44–1.00)
GFR calc Af Amer: >60 mL/min (ref >60)
GFR calc non Af Amer: >60 mL/min (ref >60)
GLUCOSE: 122 mg/dL — AB (ref 65–99)
Potassium: 3.9 mmol/L (ref 3.5–5.1)
Sodium: 135 mmol/L (ref 135–145)

## 2016-12-04 MED ORDER — INFLUENZA VAC SPLIT QUAD 0.5 ML IM SUSY
0.5000 mL | PREFILLED_SYRINGE | INTRAMUSCULAR | Status: AC
  Administered 2016-12-05: 0.5 mL via INTRAMUSCULAR
  Filled 2016-12-04: qty 0.5

## 2016-12-04 MED ORDER — BUPRENORPHINE HCL-NALOXONE HCL 8-2 MG SL SUBL
2.0000 | SUBLINGUAL_TABLET | Freq: Every day | SUBLINGUAL | Status: DC
  Administered 2016-12-04 – 2016-12-05 (×2): 2 via SUBLINGUAL
  Filled 2016-12-04: qty 2
  Filled 2016-12-04: qty 1

## 2016-12-04 NOTE — Progress Notes (Signed)
CSW received call back from pt's OP [facility] Explained pt has been referred to [facility] ([facility] program designed for mothers in treatment to bring their children to live with them during 2 yr program). States pt had appt yesterday to have TB test, Hepatitis A&B test, and physical, and also to sign consent paperwork and complete medication list with her psychiatrist in order to finish application for the program. As pt was in hospital, agency is prepared for pt to come in next Monday 12/09/16 instead in order to complete her application. At that point [facility]. CSW relayed above information to pt and included instructions in DC paperwork. Updated attending. Plan: when pt medically stable for DC will return home. Will attend OP appt 12/09/16 to complete admission paperwork for residential treatment program.   Ilean SkillMeghan Terilynn Buresh, MSW, LCSW Clinical Social Work 12/04/2016 [phone]

## 2016-12-04 NOTE — Progress Notes (Signed)
PROGRESS NOTE    Megan Edwards  ZOX:[id number] DOB: 30-Mar-1975 DOA: 12/01/2016 PCP: Gilda CreasePavelock, Richard M, MD     Brief Narrative:  Megan Edwards is a 41 yo female with hx of alcoholism, polysubstance abuse who came with chief complaint of alcohol withdrawal for the past 24 hours with tremors, anxiety, palpitations, nausea, vomiting, epigastric discomfort, diarrhea, chest tightness. She was sober for 2 years then relapsed 2 months ago, taking 12 shots of hard liquor daily with last one being 2 days ago. She was admitted for alcohol withdrawal and managed with CIWA ativan.   Assessment & Plan:   Active Problems:   Bipolar I disorder, most recent episode manic, severe with psychotic features (HCC)   Essential hypertension   Head ache   Alcohol withdrawal (HCC)  Alcohol withdrawal -Improved. Has not required Ativan in the past 24 hours -Continue thiamine  Acute encephalopathy -Very lethargic, slurring words, confused this morning -In setting of polypharmacy. She has been getting Suboxone 3 times daily instead of her usual twice daily, librium, Strattera, Cogentin, lithium, paliperidone, Haldol. She is also on home neurontin, ambien, trazodone, seroquel. I will resume her prior to admission suboxone dose of twice daily, stop Librium, stop Haldol  Alcoholism and polysubstance abuse -Patient on wait list for substance abuse program. Social work consulted.   Hypertension -Continue labetalol  Mood disorder -Continue lithium, paliperidone, prazosin, holding ambien, trazodone, seroquel   DVT prophylaxis: Lovenox Code Status: Full Family Communication: Spoke with mother over the phone Disposition Plan: Rehab vs home    Consultants:   None  Procedures:   None  Antimicrobials:  Anti-infectives    None       Subjective: Patient without any complaints this morning, however, remains confused. Oriented to self and place but not to time. Slurring words, stating that she  doesn't feel well and that she feels delirious. Does not feel like she could go home.   Objective: Vitals:   12/03/16 2100 12/04/16 0502 12/04/16 1105 12/04/16 1318  BP: (!) 142/73 (!) 148/86 139/87 129/78  Pulse: 99 98 (!) 118 (!) 105  Resp: 18 20  18   Temp: 98 F (36.7 C) 98.4 F (36.9 C)  98.7 F (37.1 C)  TempSrc: Oral Oral  Oral  SpO2: 92% 94%  94%  Weight:      Height:        Intake/Output Summary (Last 24 hours) at 12/04/16 1350 Last data filed at 12/04/16 0600  Gross per 24 hour  Intake              120 ml  Output             1300 ml  Net            -1180 ml   Filed Weights   12/01/16 2236  Weight: 108.1 kg (238 lb 5.1 oz)    Examination:  General exam: Appears calm and comfortable  Respiratory system: Clear to auscultation. Respiratory effort normal. Cardiovascular system: S1 & S2 heard, Tachycardic, regular rhythm. No JVD, murmurs, rubs, gallops or clicks. No pedal edema. Gastrointestinal system: Abdomen is nondistended, soft and nontender. No organomegaly or masses felt. Normal bowel sounds heard. Central nervous system: Alert and oriented to self and place, nonfocal  Extremities: Symmetric 5 x 5 power. Skin: No rashes, lesions or ulcers Psychiatry: Judgement and insight appear poor, confused today   Data Reviewed: I have personally reviewed following labs and imaging studies  CBC:  Recent Labs Lab 12/01/16 1838  12/02/16 0332 12/03/16 0332  WBC 15.1* 10.3 10.7*  HGB 13.6 11.6* 12.3  HCT 40.3 34.9* 37.8  MCV 83.3 83.1 85.9  PLT 314 223 179   Basic Metabolic Panel:  Recent Labs Lab 12/01/16 1838 12/02/16 0332 12/03/16 0332 12/04/16 0412  NA 137 138 135 135  K 3.5 3.3* 4.2 3.9  CL 101 104 97* 95*  CO2 32  GLUCOSE 120* 99 135* 122*  BUN CREATININE 0.73 0.72 0.77 0.61  CALCIUM 9.6 8.5* 9.0 9.1  MG  --  1.7  --   --    GFR: Estimated Creatinine Clearance: 117.2 mL/min (by C-G formula based on SCr of 0.61 mg/dL). Liver  Function Tests:  Recent Labs Lab 12/01/16 1838 12/02/16 0332  AST 38 29  ALT 42 35  ALKPHOS 94 79  BILITOT 0.4 0.3  PROT 7.7 6.6  ALBUMIN 4.2 3.8    Recent Labs Lab 12/01/16 1838  LIPASE 15   No results for input(s): AMMONIA in the last 168 hours. Coagulation Profile: No results for input(s): INR, PROTIME in the last 168 hours. Cardiac Enzymes:  Recent Labs Lab 12/02/16 0332  TROPONINI <0.03   BNP (last 3 results) No results for input(s): PROBNP in the last 8760 hours. HbA1C: No results for input(s): HGBA1C in the last 72 hours. CBG: No results for input(s): GLUCAP in the last 168 hours. Lipid Profile: No results for input(s): CHOL, HDL, LDLCALC, TRIG, CHOLHDL, LDLDIRECT in the last 72 hours. Thyroid Function Tests: No results for input(s): TSH, T4TOTAL, FREET4, T3FREE, THYROIDAB in the last 72 hours. Anemia Panel: No results for input(s): VITAMINB12, FOLATE, FERRITIN, TIBC, IRON, RETICCTPCT in the last 72 hours. Sepsis Labs: No results for input(s): PROCALCITON, LATICACIDVEN in the last 168 hours.  Recent Results (from the past 240 hour(s))  MRSA PCR Screening     Status: None   Collection Time: 12/01/16 10:41 PM  Result Value Ref Range Status   MRSA by PCR NEGATIVE NEGATIVE Final    Comment:        The GeneXpert MRSA Assay (FDA approved for NASAL specimens only), is one component of a comprehensive MRSA colonization surveillance program. It is not intended to diagnose MRSA infection nor to guide or monitor treatment for MRSA infections.        Radiology Studies: Ct Head Wo Contrast  Result Date: 12/03/2016 CLINICAL DATA:  Acute headache. EXAM: CT HEAD WITHOUT CONTRAST TECHNIQUE: Contiguous axial images were obtained from the base of the skull through the vertex without intravenous contrast. COMPARISON:  MRI brain dated Jul 31, 2016. CT head dated July 01, 2016. FINDINGS: Brain: No evidence of acute infarction, hemorrhage, hydrocephalus, extra-axial  collection or mass lesion/mass effect.Fall all Vascular: No hyperdense vessel or unexpected calcification. Skull: Normal. Negative for fracture or focal lesion. Sinuses/Orbits: The bilateral paranasal sinuses and mastoid air cells are clear. The orbits are unremarkable. Other: None. IMPRESSION: Normal head CT. Electronically Signed   By: Obie Dredge M.D.   On: 12/03/2016 08:36      Scheduled Meds: . atomoxetine  80 mg Oral Daily  . benztropine  1 mg Oral QHS  . buprenorphine-naloxone  2 tablet Sublingual Daily  . enoxaparin (LOVENOX) injection  40 mg Subcutaneous QHS  . fluticasone  2 spray Each Nare Daily  . [START ON 12/05/2016] Influenza vac split quadrivalent PF  0.5 mL Intramuscular Tomorrow-1000  . labetalol  100 mg Oral BID  . lithium carbonate  900 mg  Oral QHS  . LORazepam  0-4 mg Intravenous Q12H   Or  . LORazepam  0-4 mg Oral Q12H  . paliperidone  9 mg Oral Daily  . pantoprazole  40 mg Oral Daily  . prazosin  2 mg Oral QHS  . thiamine  100 mg Oral Daily   Continuous Infusions:   LOS: 3 days    Time spent: 40 minutes   Noralee Stain, DO [facility] [email or web address] Password [email or web address] 12/04/2016, 1:50 PM

## 2016-12-04 NOTE — Clinical Social Work Note (Signed)
Clinical Social Work Assessment  Patient Details  Name: Megan Edwards MRN: [id number] Date of Birth: 07-30-1975  Date of referral:  12/04/16               Reason for consult:  Substance Use/ETOH Abuse                Permission sought to share information with:  Other Permission granted to share information::  Yes, Verbal Permission Granted  Name::        Agency::  [facility] [phone]  Relationship::     Contact Information:     Housing/Transportation Living arrangements for the past 2 months:  [place] of Information:  Patient Patient Interpreter Needed:  None Criminal Activity/Legal Involvement Pertinent to Current Situation/Hospitalization:  No - Comment as needed Significant Relationships:  Parents, Dependent Children Lives with:  Minor Children, Parents Do you feel safe going back to the place where you live?  Yes Need for family participation in patient care:  No (Coment)  Care giving concerns:  N/A   Facilities manager / plan:  CSW received consult to assess substance use issues.  Met with pt at bedside. Pt known to CSW from psychiatry service line. Pt has long hx of SA/MH treatment both inpatient and outpatient.  Pt currently sees Paediatric nurse [facility] for medications management. Reports she is prescribed Suboxone, Strattera, and Lithium, and takes all as prescribed. Sees [facility] for therapy.  Pt reports being on "waiting list for a 2 year substance abuse treatment program," cannot recall facility- Consented for CSW to contact providers to investigate where pt has been referred and status. (ROIs for her providers on file). CSW left voicemail for [facility] therapist as pt reports this is who has assisted her with SA treatment referrals.   Pt reports one month ago she relapsed on alcohol following 3 years sober. States she has been drinking 6-10 shots of liquor daily. Confused on timing, as she states she  last drank "yesterday morning" however she has been in hospital x3 days. States, "I feel so foggy." Reports that the stressor leading to her relapse was "I lost my kids for a while. Now they're back with me- live with me and my mom, but the stress got to me, I felt like I had lost everything." Pt states she had DWI about 8 months ago and court proceedings are now resolved, denies current charges.  Pt declines for CSW to speak with family- reports she is still living with her mom who is supportive and helps with her kids. States she will return there at DC.  Pt was cooperative with assessment, became quite drowsy toward end of interaction and also was guarded re: her family and her treatment.   Plan: CSW awaiting return call from pt's OP provider re status of her being waitlisted for residential SA treatment. Pt has established OP mental health care and plans to return home (mother's home).    Employment status:  Unemployed Forensic scientist:  Medicaid In [place] PT Recommendations:  Not assessed at this time Information / Referral to community resources:  Outpatient Substance Abuse Treatment Options, Residential Substance Abuse Treatment Options  Patient/Family's Response to care:  Appreciative of care  Patient/Family's Understanding of and Emotional Response to Diagnosis, Current Treatment, and Prognosis:  Pt demonstrates adequate understanding of treatment and plan. Is disoriented to time and states, "My head is so foggy," but otherwise unremarkable. She did become more guarded  toward end of conversation but was pleasant throughout. States, "I want to go to a 2 year rehab."  Emotional Assessment Appearance:  Appears older than stated age Attitude/Demeanor/Rapport:  Guarded (drowsy) Affect (typically observed):  Calm Orientation:  Oriented to Self, Oriented to Place, Oriented to Situation Alcohol / Substance use:  Not Applicable Psych involvement (Current and /or in the community):   Outpatient Provider, Yes (Comment) [facility])  Discharge Needs  Concerns to be addressed:  Substance Abuse Concerns Readmission within the last 30 days:  No Current discharge risk:  None Barriers to Discharge:  No barriers identified   Nila Nephew, LCSW 12/04/2016, 11:09 AM  [phone]

## 2016-12-04 NOTE — Discharge Instructions (Signed)
WhereJovita Kussmaul: Evans Blount [address], [place], [place] [id number] [phone] When: Monday 12/09/16 Instructions: Please present to office 12/09/16 morning in order to complete labwork and admission requirements for residential program (please come prepared for TB test $30)

## 2016-12-05 NOTE — Discharge Summary (Signed)
Physician Discharge Summary  Megan Edwards QPY:[id number] DOB: 1975-10-17 DOA: 12/01/2016  PCP: Javier Docker, MD  Admit date: 12/01/2016 Discharge date: 12/05/2016  Admitted From: Home Disposition:  Home  Recommendations for Outpatient Follow-up:  1. Follow up with PCP in 1 week 2. Follow up with [facility] to complete paperwork for [facility] residential program for rehab. Appointment scheduled for 12/09/2016.  3. Follow up with [facility] for therapy  4. Stop drinking alcohol   Discharge Condition: Stable CODE STATUS: Full  Diet recommendation: Heart healthy   Brief/Interim Summary: Megan Edwards is a 41 yo female with hx of alcoholism, polysubstance abuse who came with chief complaint of alcohol withdrawal for the past 24 hours with tremors, anxiety, palpitations, nausea, vomiting, epigastric discomfort, diarrhea, chest tightness. She was sober for 2 years then relapsed 2 months ago, taking 12 shots of hard liquor daily with last one being 2 days ago. She was admitted for alcohol withdrawal and managed with CIWA ativan. She went through withdrawal and met with social work for outpatient rehabilitation follow up. She was encouraged to stop alcohol intake indefinitely.   Discharge Diagnoses:  Principal Problem:   Alcohol withdrawal ([place]) Active Problems:   Bipolar I disorder, most recent episode manic, severe with psychotic features ([place])   Essential hypertension   Head ache  Alcohol withdrawal -Improved and now past the withdrawal phase   Acute encephalopathy -In setting of polypharmacy. She has been getting Suboxone 3 times daily instead of her usual twice daily, librium, Strattera, Cogentin, lithium, paliperidone, Haldol. She is also on home neurontin, ambien, trazodone, seroquel. I will resume her prior to admission suboxone dose of twice daily, stop Librium, stop Haldol. Would recommend her PCP and psychiatry to continue wean off some of  her medications   Alcoholism and polysubstance abuse -Patient on wait list for substance abuse program. Social work consulted.   Hypertension -Continue labetalol  Mood disorder -Continue lithium, paliperidone, prazosin, seroquel, holding ambien, trazodone   Discharge Instructions  Discharge Instructions    Call MD for:  difficulty breathing, headache or visual disturbances    Complete by:  As directed    Call MD for:  extreme fatigue    Complete by:  As directed    Call MD for:  hives    Complete by:  As directed    Call MD for:  persistant dizziness or light-headedness    Complete by:  As directed    Call MD for:  persistant nausea and vomiting    Complete by:  As directed    Call MD for:  severe uncontrolled pain    Complete by:  As directed    Call MD for:  temperature >100.4    Complete by:  As directed    Diet - low sodium heart healthy    Complete by:  As directed    Discharge instructions    Complete by:  As directed    You were cared for by a hospitalist during your hospital stay. If you have any questions about your discharge medications or the care you received while you were in the hospital after you are discharged, you can call the unit and asked to speak with the hospitalist on call if the hospitalist that took care of you is not available. Once you are discharged, your primary care physician will handle any further medical issues. Please note that NO REFILLS for any discharge medications will be authorized once you are discharged,  as it is imperative that you return to your primary care physician (or establish a relationship with a primary care physician if you do not have one) for your aftercare needs so that they can reassess your need for medications and monitor your lab values.   Increase activity slowly    Complete by:  As directed      Allergies as of 12/05/2016      Reactions   Other Other (See Comments)   Pt does not take narcotics, is on Suboxone.     Penicillins Rash, Other (See Comments)   Has patient had a PCN reaction causing immediate rash, facial/tongue/throat swelling, SOB or lightheadedness with hypotension: Yes Has patient had a PCN reaction causing severe rash involving mucus membranes or skin necrosis: No Has patient had a PCN reaction that required hospitalization No Has patient had a PCN reaction occurring within the last 10 years: No If all of the above answers are "NO", then may proceed with Cephalosporin use.      Medication List    STOP taking these medications   gabapentin 100 MG capsule Commonly known as:  NEURONTIN   traZODone 50 MG tablet Commonly known as:  DESYREL   zolpidem 10 MG tablet Commonly known as:  AMBIEN     TAKE these medications   atomoxetine 80 MG capsule Commonly known as:  STRATTERA Take 80 mg by mouth daily.   benztropine 1 MG tablet Commonly known as:  COGENTIN Take 1 tablet (1 mg total) by mouth at bedtime. For prevention drug related tremors.   buprenorphine-naloxone 8-2 mg Subl SL tablet Commonly known as:  SUBOXONE Place 2 tablets under the tongue daily.   labetalol 100 MG tablet Commonly known as:  NORMODYNE Take 1 tablet (100 mg) in the morning & 2 tablets (200 mg) in the evening: For high blood pressure   lithium carbonate 300 MG capsule Take 3 capsules (900 mg total) by mouth at bedtime. For mood stabilization   paliperidone 9 MG 24 hr tablet Commonly known as:  INVEGA Take 1 tablet (9 mg total) by mouth at bedtime. For mood control   prazosin 2 MG capsule Commonly known as:  MINIPRESS Take 1 capsule (2 mg total) by mouth at bedtime. For nightmares   QUEtiapine 200 MG tablet Commonly known as:  SEROQUEL Take 200 mg by mouth as needed.            Discharge Care Instructions        Start     Ordered   12/05/16 0000  Increase activity slowly     12/05/16 1207   12/05/16 0000  Diet - low sodium heart healthy     12/05/16 1207   12/05/16 0000   Discharge instructions    Comments:  You were cared for by a hospitalist during your hospital stay. If you have any questions about your discharge medications or the care you received while you were in the hospital after you are discharged, you can call the unit and asked to speak with the hospitalist on call if the hospitalist that took care of you is not available. Once you are discharged, your primary care physician will handle any further medical issues. Please note that NO REFILLS for any discharge medications will be authorized once you are discharged, as it is imperative that you return to your primary care physician (or establish a relationship with a primary care physician if you do not have one) for your aftercare needs so that they  can reassess your need for medications and monitor your lab values.   12/05/16 1207   12/05/16 0000  Call MD for:  extreme fatigue     12/05/16 1207   12/05/16 0000  Call MD for:  persistant dizziness or light-headedness     12/05/16 1207   12/05/16 0000  Call MD for:  hives     12/05/16 1207   12/05/16 0000  Call MD for:  difficulty breathing, headache or visual disturbances     12/05/16 1207   12/05/16 0000  Call MD for:  severe uncontrolled pain     12/05/16 1207   12/05/16 0000  Call MD for:  persistant nausea and vomiting     12/05/16 1207   12/05/16 0000  Call MD for:  temperature >100.4     12/05/16 1207     Follow-up Information    Pavelock, Ralene Bathe, MD. Schedule an appointment as soon as possible for a visit in 1 week(s).   Specialty:  Internal Medicine Contact information: [address] [place] [address] [phone]        [facility] Go on 12/09/2016.   Specialty:  Family Medicine Why:  To complete paperwork for [facility] residential program for rehab Contact information: [address] [place] [address] [place] [facility]  Schedule an appointment as soon as possible for a visit in 1 week(s).   Specialty:  Catering manager information: [address] [place] [address] [phone]          Allergies  Allergen Reactions  . Other Other (See Comments)    Pt does not take narcotics, is on Suboxone.   Marland Kitchen Penicillins Rash and Other (See Comments)    Has patient had a PCN reaction causing immediate rash, facial/tongue/throat swelling, SOB or lightheadedness with hypotension: Yes Has patient had a PCN reaction causing severe rash involving mucus membranes or skin necrosis: No Has patient had a PCN reaction that required hospitalization No Has patient had a PCN reaction occurring within the last 10 years: No If all of the above answers are "NO", then may proceed with Cephalosporin use.    Consultations:  None   Procedures/Studies: Ct Head Wo Contrast  Result Date: 12/03/2016 CLINICAL DATA:  Acute headache. EXAM: CT HEAD WITHOUT CONTRAST TECHNIQUE: Contiguous axial images were obtained from the base of the skull through the vertex without intravenous contrast. COMPARISON:  MRI brain dated Jul 31, 2016. CT head dated July 01, 2016. FINDINGS: Brain: No evidence of acute infarction, hemorrhage, hydrocephalus, extra-axial collection or mass lesion/mass effect.Fall all Vascular: No hyperdense vessel or unexpected calcification. Skull: Normal. Negative for fracture or focal lesion. Sinuses/Orbits: The bilateral paranasal sinuses and mastoid air cells are clear. The orbits are unremarkable. Other: None. IMPRESSION: Normal head CT. Electronically Signed   By: Titus Dubin M.D.   On: 12/03/2016 08:36       Discharge Exam: Vitals:   12/04/16 2103 12/05/16 0446  BP: 135/89 132/65  Pulse: (!) 101 (!) 101  Resp: 18 18  Temp: 98.7 F (37.1 C) 98.4 F (36.9 C)  SpO2: 98% 97%   Vitals:   12/04/16 1105 12/04/16 1318 12/04/16 2103 12/05/16 0446  BP: 139/87 129/78 135/89 132/65  Pulse:  (!) 118 (!) 105 (!) 101 (!) 101  Resp:  '18 18 18  '$ Temp:  98.7 F (37.1 C) 98.7 F (  37.1 C) 98.4 F (36.9 C)  TempSrc:  Oral Oral Oral  SpO2:  94% 98% 97%  Weight:      Height:        General: Pt is alert, awake, not in acute distress Cardiovascular: RRR, S1/S2 +, no rubs, no gallops Respiratory: CTA bilaterally, no wheezing, no rhonchi Abdominal: Soft, NT, ND, bowel sounds + Extremities: no edema, no cyanosis Neuro: nonfocal exam, still sleepy but on second evaluation more awake and alert     The results of significant diagnostics from this hospitalization (including imaging, microbiology, ancillary and laboratory) are listed below for reference.     Microbiology: Recent Results (from the past 240 hour(s))  MRSA PCR Screening     Status: None   Collection Time: 12/01/16 10:41 PM  Result Value Ref Range Status   MRSA by PCR NEGATIVE NEGATIVE Final    Comment:        The GeneXpert MRSA Assay (FDA approved for NASAL specimens only), is one component of a comprehensive MRSA colonization surveillance program. It is not intended to diagnose MRSA infection nor to guide or monitor treatment for MRSA infections.      Labs: BNP (last 3 results) No results for input(s): BNP in the last 8760 hours. Basic Metabolic Panel:  Recent Labs Lab 12/01/16 1838 12/02/16 0332 12/03/16 0332 12/04/16 0412  NA 137 138 135 135  K 3.5 3.3* 4.2 3.9  CL 101 104 97* 95*  CO2 '23 25 30 '$ 32  GLUCOSE 120* 99 135* 122*  BUN '11 10 7 9  '$ CREATININE 0.73 0.72 0.77 0.61  CALCIUM 9.6 8.5* 9.0 9.1  MG  --  1.7  --   --    Liver Function Tests:  Recent Labs Lab 12/01/16 1838 12/02/16 0332  AST 38 29  ALT 42 35  ALKPHOS 94 79  BILITOT 0.4 0.3  PROT 7.7 6.6  ALBUMIN 4.2 3.8    Recent Labs Lab 12/01/16 1838  LIPASE 15   No results for input(s): AMMONIA in the last 168 hours. CBC:  Recent Labs Lab 12/01/16 1838 12/02/16 0332 12/03/16 0332  WBC 15.1* 10.3 10.7*  HGB 13.6  11.6* 12.3  HCT 40.3 34.9* 37.8  MCV 83.3 83.1 85.9  PLT 314 223 179   Cardiac Enzymes:  Recent Labs Lab 12/02/16 0332  TROPONINI <0.03   BNP: Invalid input(s): POCBNP CBG: No results for input(s): GLUCAP in the last 168 hours. D-Dimer No results for input(s): DDIMER in the last 72 hours. Hgb A1c No results for input(s): HGBA1C in the last 72 hours. Lipid Profile No results for input(s): CHOL, HDL, LDLCALC, TRIG, CHOLHDL, LDLDIRECT in the last 72 hours. Thyroid function studies No results for input(s): TSH, T4TOTAL, T3FREE, THYROIDAB in the last 72 hours.  Invalid input(s): FREET3 Anemia work up No results for input(s): VITAMINB12, FOLATE, FERRITIN, TIBC, IRON, RETICCTPCT in the last 72 hours. Urinalysis    Component Value Date/Time   COLORURINE STRAW (A) 12/01/2016 1842   APPEARANCEUR CLEAR 12/01/2016 1842   LABSPEC 1.005 12/01/2016 1842   PHURINE 5.0 12/01/2016 1842   GLUCOSEU NEGATIVE 12/01/2016 1842   HGBUR SMALL (A) 12/01/2016 1842   BILIRUBINUR NEGATIVE 12/01/2016 1842   BILIRUBINUR negative 06/01/2015 1729   KETONESUR NEGATIVE 12/01/2016 1842   PROTEINUR NEGATIVE 12/01/2016 1842   UROBILINOGEN negative 06/01/2015 1729   UROBILINOGEN 0.2 12/18/2014 0827   NITRITE NEGATIVE 12/01/2016 1842   LEUKOCYTESUR NEGATIVE 12/01/2016 1842   Sepsis Labs Invalid input(s): PROCALCITONIN,  WBC,  LACTICIDVEN  Microbiology Recent Results (from the past 240 hour(s))  MRSA PCR Screening     Status: None   Collection Time: 12/01/16 10:41 PM  Result Value Ref Range Status   MRSA by PCR NEGATIVE NEGATIVE Final    Comment:        The GeneXpert MRSA Assay (FDA approved for NASAL specimens only), is one component of a comprehensive MRSA colonization surveillance program. It is not intended to diagnose MRSA infection nor to guide or monitor treatment for MRSA infections.      Time coordinating discharge: 40 minutes  SIGNED:  Dessa Phi, DO [facility] Pager [phone]  If 7PM-7AM, please contact night-coverage [email or web address] Password [facility] 12/05/2016, 12:21 PM

## 2016-12-12 ENCOUNTER — Emergency Department (HOSPITAL_COMMUNITY): Payer: Medicaid Other

## 2016-12-12 ENCOUNTER — Encounter (HOSPITAL_COMMUNITY): Payer: Self-pay | Admitting: *Deleted

## 2016-12-12 ENCOUNTER — Emergency Department (HOSPITAL_COMMUNITY)
Admission: EM | Admit: 2016-12-12 | Discharge: 2016-12-13 | Disposition: A | Discharge status: Home / Self Care | Payer: Medicaid Other | Attending: Emergency Medicine | Admitting: Emergency Medicine

## 2016-12-12 ENCOUNTER — Other Ambulatory Visit: Payer: Self-pay

## 2016-12-12 DIAGNOSIS — F319 Bipolar disorder, unspecified: Secondary | ICD-10-CM | POA: Diagnosis not present

## 2016-12-12 DIAGNOSIS — F259 Schizoaffective disorder, unspecified: Secondary | ICD-10-CM | POA: Insufficient documentation

## 2016-12-12 DIAGNOSIS — F419 Anxiety disorder, unspecified: Secondary | ICD-10-CM | POA: Insufficient documentation

## 2016-12-12 DIAGNOSIS — I10 Essential (primary) hypertension: Secondary | ICD-10-CM | POA: Insufficient documentation

## 2016-12-12 DIAGNOSIS — R002 Palpitations: Secondary | ICD-10-CM | POA: Diagnosis present

## 2016-12-12 DIAGNOSIS — Z79899 Other long term (current) drug therapy: Secondary | ICD-10-CM | POA: Diagnosis not present

## 2016-12-12 LAB — CBC WITH DIFFERENTIAL/PLATELET
BASOS ABS: 0 10*3/uL (ref 0.0–0.1)
Basophils Relative: 0 %
EOS PCT: 3 %
Eosinophils Absolute: 0.4 10*3/uL (ref 0.0–0.7)
HCT: 34.7 % — ABNORMAL LOW (ref 36.0–46.0)
Hemoglobin: 11.6 g/dL — ABNORMAL LOW (ref 12.0–15.0)
LYMPHS PCT: 17 %
Lymphs Abs: 2.2 10*3/uL (ref 0.7–4.0)
MCH: 28.1 pg (ref 26.0–34.0)
MCHC: 33.4 g/dL (ref 30.0–36.0)
MCV: 84 fL (ref 78.0–100.0)
Monocytes Absolute: 0.6 10*3/uL (ref 0.1–1.0)
Monocytes Relative: 5 %
NEUTROS ABS: 9.5 10*3/uL — AB (ref 1.7–7.7)
NEUTROS PCT: 75 %
Platelets: 230 10*3/uL (ref 150–400)
RBC: 4.13 MIL/uL (ref 3.87–5.11)
RDW: 14.3 % (ref 11.5–15.5)
WBC: 12.7 10*3/uL — AB (ref 4.0–10.5)

## 2016-12-12 LAB — COMPREHENSIVE METABOLIC PANEL
ALT: 20 U/L (ref 14–54)
AST: 30 U/L (ref 15–41)
Albumin: 3.9 g/dL (ref 3.5–5.0)
Alkaline Phosphatase: 81 U/L (ref 38–126)
Anion gap: 11 (ref 5–15)
BUN: 14 mg/dL (ref 6–20)
CO2: 26 mmol/L (ref 22–32)
CREATININE: 0.83 mg/dL (ref 0.44–1.00)
Calcium: 9.2 mg/dL (ref 8.9–10.3)
Chloride: 101 mmol/L (ref 101–111)
GFR calc Af Amer: >60 mL/min (ref >60)
Glucose, Bld: 148 mg/dL — ABNORMAL HIGH (ref 65–99)
Potassium: 3.6 mmol/L (ref 3.5–5.1)
Sodium: 138 mmol/L (ref 135–145)
Total Bilirubin: 0.2 mg/dL — ABNORMAL LOW (ref 0.3–1.2)
Total Protein: 7.2 g/dL (ref 6.5–8.1)

## 2016-12-12 LAB — LITHIUM LEVEL: LITHIUM LVL: 0.27 mmol/L — AB (ref 0.60–1.20)

## 2016-12-12 LAB — I-STAT TROPONIN, ED: TROPONIN I, POC: 0.01 ng/mL (ref 0.00–0.08)

## 2016-12-12 LAB — TSH: TSH: 2.496 u[IU]/mL (ref 0.350–4.500)

## 2016-12-12 LAB — I-STAT BETA HCG BLOOD, ED (MC, WL, AP ONLY): I-stat hCG, quantitative: <5 m[IU]/mL (ref <5)

## 2016-12-12 LAB — ACETAMINOPHEN LEVEL: Acetaminophen (Tylenol), Serum: <10 ug/mL — ABNORMAL LOW (ref 10–30)

## 2016-12-12 LAB — SALICYLATE LEVEL: Salicylate Lvl: <7 mg/dL (ref 2.8–30.0)

## 2016-12-12 MED ORDER — QUETIAPINE FUMARATE 100 MG PO TABS
200.0000 mg | ORAL_TABLET | ORAL | Status: DC | PRN
  Administered 2016-12-13: 200 mg via ORAL
  Filled 2016-12-12: qty 2

## 2016-12-12 MED ORDER — PAROXETINE HCL 20 MG PO TABS
20.0000 mg | ORAL_TABLET | Freq: Every day | ORAL | Status: DC
  Filled 2016-12-12: qty 1

## 2016-12-12 MED ORDER — PRAZOSIN HCL 1 MG PO CAPS
2.0000 mg | ORAL_CAPSULE | Freq: Every day | ORAL | Status: DC
  Administered 2016-12-13: 2 mg via ORAL
  Filled 2016-12-12: qty 2

## 2016-12-12 MED ORDER — PALIPERIDONE ER 6 MG PO TB24
9.0000 mg | ORAL_TABLET | Freq: Every day | ORAL | Status: DC
  Administered 2016-12-13: 01:00:00 9 mg via ORAL
  Filled 2016-12-12: qty 1

## 2016-12-12 MED ORDER — LORAZEPAM 1 MG PO TABS
1.0000 mg | ORAL_TABLET | Freq: Once | ORAL | Status: AC
  Administered 2016-12-12: 1 mg via ORAL
  Filled 2016-12-12: qty 1

## 2016-12-12 MED ORDER — BENZTROPINE MESYLATE 1 MG PO TABS
1.0000 mg | ORAL_TABLET | Freq: Every day | ORAL | Status: DC
  Administered 2016-12-13: 1 mg via ORAL
  Filled 2016-12-12: qty 1

## 2016-12-12 MED ORDER — LITHIUM CARBONATE 300 MG PO CAPS
900.0000 mg | ORAL_CAPSULE | Freq: Every day | ORAL | Status: DC
  Administered 2016-12-13: 900 mg via ORAL
  Filled 2016-12-12: qty 3

## 2016-12-12 MED ORDER — ATOMOXETINE HCL 40 MG PO CAPS
80.0000 mg | ORAL_CAPSULE | Freq: Every day | ORAL | Status: DC
  Filled 2016-12-12: qty 2

## 2016-12-12 MED ORDER — LABETALOL HCL 100 MG PO TABS
100.0000 mg | ORAL_TABLET | Freq: Two times a day (BID) | ORAL | Status: DC
  Administered 2016-12-13: 100 mg via ORAL
  Filled 2016-12-12 (×2): qty 1

## 2016-12-12 MED ORDER — ONDANSETRON HCL 4 MG PO TABS
4.0000 mg | ORAL_TABLET | Freq: Three times a day (TID) | ORAL | Status: DC | PRN
  Administered 2016-12-13: 4 mg via ORAL
  Filled 2016-12-12: qty 1

## 2016-12-12 MED ORDER — ACETAMINOPHEN 325 MG PO TABS: 650.0000 mg | ORAL_TABLET | ORAL | Status: DC | PRN

## 2016-12-12 NOTE — ED Notes (Signed)
Bed: WA27 Expected date:  Expected time:  Means of arrival:  Comments: 

## 2016-12-12 NOTE — ED Provider Notes (Signed)
[facility]-EMERGENCY DEPT Provider Note   CSN: [id number] Arrival date & time: 12/12/16  1849     History   Chief Complaint No chief complaint on file.   HPI Megan Edwards is a 41 y.o. female.  HPI 41 year old female who presents with palpitations and anxiety. She has history of Bipolar disorder, schizoaffective disorder. Just discharged from hospital for alcohol withdrawal on 12/05/16. States she has not started drinking since then, but feeling of palpitations, anxiety, restlessness began 3 days ago after started paxil. Was discontinued on ambien, gabapentin and trazodone in the hospital. Denies nausea, vomiting, diarrhea, abdominal pain. States she feels panicked. No SI/HI or AVH. Denies illicit drug use. States she just wants the feeling to go away.  Past Medical History:  Diagnosis Date  . Abnormal Pap smear   . Bipolar 1 disorder (HCC)   . Depression   . Genital herpes 2000  . Hypertension   . Infection    UTI  . Ovarian cyst   . Schizoaffective disorder (HCC) 05/05/2016  . SVT (supraventricular tachycardia) [facility])     Patient Active Problem List   Diagnosis Date Noted  . Alcohol withdrawal (HCC) 12/01/2016  . ADD (attention deficit disorder) 05/10/2016  . Head ache 05/08/2016  . Opioid use disorder, mild, in controlled environment 05/06/2016  . Essential hypertension 05/06/2016  . Alcohol use disorder, moderate, in early remission, dependence (HCC) 05/06/2016  . Bipolar I disorder, most recent episode manic, severe with psychotic features (HCC) 01/20/2016  . Chronic pain syndrome   . Morbid obesity due to excess calories [facility])     Past Surgical History:  Procedure Laterality Date  . addenoidectomy    . BREAST ENHANCEMENT SURGERY    . BREAST SURGERY    . KNEE SURGERY    . KNEE SURGERY Left   . TONSILLECTOMY    . WISDOM TOOTH EXTRACTION      OB History    Gravida Para Term Preterm AB Living   0 3 2   SAB TAB Ectopic Multiple Live Births   1 2 0 0 2       Home Medications    Prior to Admission medications   Medication Sig Start Date End Date Taking? Authorizing Provider  atomoxetine (STRATTERA) 80 MG capsule Take 80 mg by mouth daily. 11/06/16  Yes [provider]  benztropine (COGENTIN) 1 MG tablet Take 1 tablet (1 mg total) by mouth at bedtime. For prevention drug related tremors. 05/13/16  Yes Armandina Stammer I, NP  buprenorphine-naloxone (SUBOXONE) 8-2 mg SUBL SL tablet Place 2 tablets under the tongue daily.    Yes [provider]  gabapentin (NEURONTIN) 100 MG capsule Take 200 mg by mouth at bedtime. 12/10/16  Yes [provider]  labetalol (NORMODYNE) 100 MG tablet Take 1 tablet (100 mg) in the morning & 2 tablets (200 mg) in the evening: For high blood pressure 05/13/16  Yes Nwoko, Nicole Kindred I, NP  lithium carbonate 300 MG capsule Take 3 capsules (900 mg total) by mouth at bedtime. For mood stabilization 05/13/16  Yes Nwoko, Nicole Kindred I, NP  paliperidone (INVEGA) 9 MG 24 hr tablet Take 1 tablet (9 mg total) by mouth at bedtime. For mood control 05/13/16  Yes Nwoko, Nicole Kindred I, NP  PARoxetine (PAXIL) 20 MG tablet Take 20 mg by mouth daily. 12/10/16  Yes [provider]  prazosin (MINIPRESS) 2 MG capsule Take 1 capsule (2 mg total) by mouth at bedtime. For nightmares 05/13/16  Yes Nwoko, Agnes I, NP  QUEtiapine (SEROQUEL) 200 MG tablet Take 200 mg by mouth as needed (sleep).  07/03/16  Yes [provider]  zolpidem (AMBIEN) 10 MG tablet Take 10 mg by mouth at bedtime. 11/04/16   [provider]    Family History Family History  Problem Relation Age of Onset  . Alcoholism Unknown   . Leukemia Unknown   . Cervical cancer Unknown   . Bone cancer Unknown   . Cirrhosis Unknown   . Diabetes Mellitus II Unknown   . Depression Unknown   . Hypertension Unknown   . Thyroid cancer Unknown   . Diabetes Mellitus II Mother   . Hypertension Mother   . Alcoholism Father   . Mental illness Paternal  Grandmother     Social History Social History  Substance Use Topics  . Smoking status: Never Smoker  . Smokeless tobacco: Never Used  . Alcohol use No     Comment: 4 40's beer daily- not currently     Allergies   Other and Penicillins   Review of Systems Review of Systems  Constitutional: Negative for fever.  Respiratory: Positive for shortness of breath.   Cardiovascular: Positive for chest pain.  Psychiatric/Behavioral: Negative for hallucinations and suicidal ideas. The patient is nervous/anxious and is hyperactive.   All other systems reviewed and are negative.    Physical Exam Updated Vital Signs BP (!) 129/97   Pulse 99   Temp 98.9 F (37.2 C) (Oral)   Resp 20   Ht  (1.702 m)   Wt 108 kg (238 lb)   SpO2 97%   BMI 37.28 kg/m   Physical Exam Physical Exam  Nursing note and vitals reviewed. Constitutional: Well developed, well nourished, non-toxic, and in no acute distress, appears anxious, restless Head: Normocephalic and atraumatic.  Mouth/Throat: Oropharynx is clear and moist.  Neck: Normal range of motion. Neck supple.  Cardiovascular: Tachycardic rate and regular rhythm.   Pulmonary/Chest: Effort normal and breath sounds normal.  Abdominal: Soft. There is no tenderness. There is no rebound and no guarding.  Musculoskeletal: Normal range of motion.  Neurological: Alert, no facial droop, fluent speech, moves all extremities symmetrically Skin: Skin is warm and dry.  Psychiatric: Cooperative, denies SI/HI   ED Treatments / Results  Labs (all labs ordered are listed, but only abnormal results are displayed) Labs Reviewed  CBC WITH DIFFERENTIAL/PLATELET - Abnormal; Notable for the following:       Result Value   WBC 12.7 (*)    Hemoglobin 11.6 (*)    HCT 34.7 (*)    Neutro Abs 9.5 (*)    All other components within normal limits  COMPREHENSIVE METABOLIC PANEL - Abnormal; Notable for the following:    Glucose, Bld 148 (*)    Total Bilirubin  0.2 (*)    All other components within normal limits  ACETAMINOPHEN LEVEL - Abnormal; Notable for the following:    Acetaminophen (Tylenol), Serum <10 (*)    All other components within normal limits  LITHIUM LEVEL - Abnormal; Notable for the following:    Lithium Lvl 0.27 (*)    All other components within normal limits  TSH  SALICYLATE LEVEL  I-STAT BETA HCG BLOOD, ED ([facility], [facility], [facility] ONLY)  I-STAT TROPONIN, ED    EKG  EKG Interpretation None       Radiology Dg Chest Portable 1 View  Result Date: 9/2Armandina StammerINICAL DATA:  Chest pain, anxiety, dyspnea. Hx htn, SVT EXAM: PORTABLE CHEST  1 VIEW COMPARISON:  Chest x-ray dated 10/21/2015. FINDINGS: Study is hypoinspiratory with crowding of the perihilar and bibasilar bronchovascular markings. Given the low lung volumes, lungs appear clear. No pleural effusion or pneumothorax seen. Heart size and mediastinal contours are within normal limits. No acute or suspicious osseous finding. IMPRESSION: Low lung volumes. No active disease. No evidence of pneumonia or pulmonary edema. Electronically Signed   By: Bary Richard M.D.   On: 12/12/2016 20:58    Procedures Procedures (including critical care time)  Medications Ordered in ED Medications  LORazepam (ATIVAN) tablet 1 mg (1 mg Oral Given 12/12/16 1950)     Initial Impression / Assessment and Plan / ED Course  I have reviewed the triage vital signs and the nursing notes.  Pertinent labs & imaging results that were available during my care of the patient were reviewed by me and considered in my medical decision making (see chart for details).     Presents with anxiety, restlessness. Non-toxic and in no acute distress. Initially mildly tachycardic, but improves in ED. Blood work is reassuring. Her chest tightness, dyspnea seems related to her anxiety and panic attacks. EKG is not ischemic, troponin negative, and CXR clear. Medically clear for TTS consult.  Final Clinical Impressions(s) /  ED Diagnoses   Final diagnoses:  Anxiety    New Prescriptions New Prescriptions   No medications on file     Lavera Guise, MD 12/12/16 2356

## 2016-12-12 NOTE — ED Triage Notes (Signed)
[facility]. Started paxil 3 days ago and complaining of dry mouth. Had a panic attack on EMS arrival. EMS states she has a history of manic behavior with uncontrollable behaviors. Pupils reactive-on methodone at home.

## 2016-12-13 NOTE — [facility] Counselor (Signed)
Per Nira Conn, NP: Patient does not meet inpatient criteria.  Patient has been psychiatrically cleared.  Patient recommended to receive outpatient resources and to follow-up with current her Psychiatrist [facility]) and Therapist [facility]).    Artis Delay, MD notified at [phone].  Kirsten, RN faxed outpatient resources, for suicide prevention information, 24 hour crisis numbers, and referrals to outpatient Medicaid providers, to provide to Patient at [phone].
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
## 2016-12-19 ENCOUNTER — Encounter: Payer: Self-pay | Admitting: Emergency Medicine

## 2016-12-19 ENCOUNTER — Inpatient Hospital Stay
Admission: EM | Admit: 2016-12-19 | Discharge: 2016-12-22 | DRG: 175 | Disposition: A | Discharge status: Home / Self Care | Payer: Medicaid Other | Attending: Internal Medicine | Admitting: Internal Medicine

## 2016-12-19 ENCOUNTER — Emergency Department: Payer: Medicaid Other

## 2016-12-19 ENCOUNTER — Inpatient Hospital Stay: Payer: Medicaid Other

## 2016-12-19 DIAGNOSIS — F10239 Alcohol dependence with withdrawal, unspecified: Secondary | ICD-10-CM | POA: Diagnosis present

## 2016-12-19 DIAGNOSIS — R4182 Altered mental status, unspecified: Secondary | ICD-10-CM

## 2016-12-19 DIAGNOSIS — R609 Edema, unspecified: Secondary | ICD-10-CM | POA: Diagnosis present

## 2016-12-19 DIAGNOSIS — J9601 Acute respiratory failure with hypoxia: Secondary | ICD-10-CM | POA: Diagnosis present

## 2016-12-19 DIAGNOSIS — F419 Anxiety disorder, unspecified: Secondary | ICD-10-CM | POA: Diagnosis present

## 2016-12-19 DIAGNOSIS — I10 Essential (primary) hypertension: Secondary | ICD-10-CM | POA: Diagnosis present

## 2016-12-19 DIAGNOSIS — Z79899 Other long term (current) drug therapy: Secondary | ICD-10-CM

## 2016-12-19 DIAGNOSIS — F1110 Opioid abuse, uncomplicated: Secondary | ICD-10-CM | POA: Diagnosis present

## 2016-12-19 DIAGNOSIS — Z880 Allergy status to penicillin: Secondary | ICD-10-CM

## 2016-12-19 DIAGNOSIS — J9602 Acute respiratory failure with hypercapnia: Secondary | ICD-10-CM | POA: Diagnosis present

## 2016-12-19 DIAGNOSIS — G9340 Encephalopathy, unspecified: Secondary | ICD-10-CM | POA: Diagnosis present

## 2016-12-19 DIAGNOSIS — I2699 Other pulmonary embolism without acute cor pulmonale: Principal | ICD-10-CM | POA: Diagnosis present

## 2016-12-19 DIAGNOSIS — F1120 Opioid dependence, uncomplicated: Secondary | ICD-10-CM | POA: Diagnosis present

## 2016-12-19 DIAGNOSIS — F312 Bipolar disorder, current episode manic severe with psychotic features: Secondary | ICD-10-CM | POA: Diagnosis present

## 2016-12-19 DIAGNOSIS — F10939 Alcohol use, unspecified with withdrawal, unspecified: Secondary | ICD-10-CM | POA: Diagnosis present

## 2016-12-19 DIAGNOSIS — R0602 Shortness of breath: Secondary | ICD-10-CM

## 2016-12-19 HISTORY — DX: Alcohol abuse, uncomplicated: F10.10

## 2016-12-19 LAB — URINE DRUG SCREEN, QUALITATIVE (ARMC ONLY)
AMPHETAMINES, UR SCREEN: NOT DETECTED
BARBITURATES, UR SCREEN: NOT DETECTED
BENZODIAZEPINE, UR SCRN: POSITIVE — AB
Cannabinoid 50 Ng, Ur ~~LOC~~: NOT DETECTED
Cocaine Metabolite,Ur ~~LOC~~: NOT DETECTED
MDMA (Ecstasy)Ur Screen: NOT DETECTED
METHADONE SCREEN, URINE: NOT DETECTED
Opiate, Ur Screen: NOT DETECTED
PHENCYCLIDINE (PCP) UR S: NOT DETECTED
Tricyclic, Ur Screen: POSITIVE — AB

## 2016-12-19 LAB — BLOOD GAS, VENOUS
ACID-BASE EXCESS: 4.7 mmol/L — AB (ref 0.0–2.0)
Bicarbonate: 32.5 mmol/L — ABNORMAL HIGH (ref 20.0–28.0)
FIO2: 0.21
O2 Saturation: 61.9 %
PATIENT TEMPERATURE: 37
PH VEN: 7.3 (ref 7.250–7.430)
pCO2, Ven: 66 mmHg — ABNORMAL HIGH (ref 44.0–60.0)
pO2, Ven: 36 mmHg (ref 32.0–45.0)

## 2016-12-19 LAB — CBC WITH DIFFERENTIAL/PLATELET
BASOS ABS: 0 10*3/uL (ref 0–0.1)
Basophils Relative: 0 %
EOS PCT: 1 %
Eosinophils Absolute: 0.1 10*3/uL (ref 0–0.7)
HEMATOCRIT: 33.9 % — AB (ref 35.0–47.0)
HEMOGLOBIN: 11.2 g/dL — AB (ref 12.0–16.0)
LYMPHS ABS: 0.8 10*3/uL — AB (ref 1.0–3.6)
LYMPHS PCT: 8 %
MCH: 28 pg (ref 26.0–34.0)
MCHC: 33.1 g/dL (ref 32.0–36.0)
MCV: 84.4 fL (ref 80.0–100.0)
Monocytes Absolute: 0.4 10*3/uL (ref 0.2–0.9)
Monocytes Relative: 3 %
NEUTROS ABS: 9.3 10*3/uL — AB (ref 1.4–6.5)
Neutrophils Relative %: 88 %
Platelets: 318 10*3/uL (ref 150–440)
RBC: 4.02 MIL/uL (ref 3.80–5.20)
RDW: 15.6 % — ABNORMAL HIGH (ref 11.5–14.5)
WBC: 10.6 10*3/uL (ref 3.6–11.0)

## 2016-12-19 LAB — COMPREHENSIVE METABOLIC PANEL
ALBUMIN: 3.3 g/dL — AB (ref 3.5–5.0)
ALT: 22 U/L (ref 14–54)
ANION GAP: 9 (ref 5–15)
AST: 24 U/L (ref 15–41)
Alkaline Phosphatase: 74 U/L (ref 38–126)
BILIRUBIN TOTAL: 0.2 mg/dL — AB (ref 0.3–1.2)
BUN: 7 mg/dL (ref 6–20)
CHLORIDE: 100 mmol/L — AB (ref 101–111)
CO2: 29 mmol/L (ref 22–32)
Calcium: 8.5 mg/dL — ABNORMAL LOW (ref 8.9–10.3)
Creatinine, Ser: 0.82 mg/dL (ref 0.44–1.00)
GFR calc Af Amer: >60 mL/min (ref >60)
Glucose, Bld: 152 mg/dL — ABNORMAL HIGH (ref 65–99)
POTASSIUM: 4.1 mmol/L (ref 3.5–5.1)
Sodium: 138 mmol/L (ref 135–145)
TOTAL PROTEIN: 6.7 g/dL (ref 6.5–8.1)

## 2016-12-19 LAB — MRSA PCR SCREENING: MRSA by PCR: NEGATIVE

## 2016-12-19 LAB — ANTITHROMBIN III: AntiThromb III Func: 89 % (ref 75–120)

## 2016-12-19 LAB — APTT: aPTT: 49 seconds — ABNORMAL HIGH (ref 24–36)

## 2016-12-19 LAB — HEPARIN LEVEL (UNFRACTIONATED): Heparin Unfractionated: 0.25 IU/mL — ABNORMAL LOW (ref 0.30–0.70)

## 2016-12-19 LAB — ETHANOL

## 2016-12-19 LAB — HCG, QUANTITATIVE, PREGNANCY

## 2016-12-19 LAB — TROPONIN I

## 2016-12-19 MED ORDER — BUPRENORPHINE HCL-NALOXONE HCL 8-2 MG SL SUBL
2.0000 | SUBLINGUAL_TABLET | Freq: Every day | SUBLINGUAL | Status: DC
  Administered 2016-12-20 – 2016-12-22 (×3): 2 via SUBLINGUAL
  Filled 2016-12-19 (×4): qty 2

## 2016-12-19 MED ORDER — LITHIUM CARBONATE 300 MG PO CAPS
900.0000 mg | ORAL_CAPSULE | Freq: Every day | ORAL | Status: DC
  Administered 2016-12-19 – 2016-12-21 (×3): 900 mg via ORAL
  Filled 2016-12-19 (×4): qty 3

## 2016-12-19 MED ORDER — HEPARIN BOLUS VIA INFUSION
1050.0000 [IU] | Freq: Once | INTRAVENOUS | Status: AC
  Administered 2016-12-19: 1050 [IU] via INTRAVENOUS
  Filled 2016-12-19: qty 1050

## 2016-12-19 MED ORDER — SODIUM CHLORIDE 0.9 % IV SOLN
INTRAVENOUS | Status: DC
  Administered 2016-12-19 – 2016-12-20 (×2): via INTRAVENOUS

## 2016-12-19 MED ORDER — HEPARIN (PORCINE) IN NACL 100-0.45 UNIT/ML-% IJ SOLN
1500.0000 [IU]/h | INTRAMUSCULAR | Status: DC
  Administered 2016-12-19: 1200 [IU]/h via INTRAVENOUS
  Administered 2016-12-20: 1500 [IU]/h via INTRAVENOUS
  Filled 2016-12-19 (×3): qty 250

## 2016-12-19 MED ORDER — GABAPENTIN 100 MG PO CAPS
200.0000 mg | ORAL_CAPSULE | Freq: Every day | ORAL | Status: DC
  Administered 2016-12-19 – 2016-12-21 (×3): 200 mg via ORAL
  Filled 2016-12-19 (×3): qty 2

## 2016-12-19 MED ORDER — PRAZOSIN HCL 1 MG PO CAPS
2.0000 mg | ORAL_CAPSULE | Freq: Every day | ORAL | Status: DC
  Administered 2016-12-19 – 2016-12-21 (×3): 2 mg via ORAL
  Filled 2016-12-19 (×3): qty 2
  Filled 2016-12-19: qty 1

## 2016-12-19 MED ORDER — PALIPERIDONE ER 3 MG PO TB24
9.0000 mg | ORAL_TABLET | Freq: Every day | ORAL | Status: DC
  Administered 2016-12-19 – 2016-12-21 (×3): 9 mg via ORAL
  Filled 2016-12-19 (×4): qty 3

## 2016-12-19 MED ORDER — ACETAMINOPHEN 650 MG RE SUPP: 650.0000 mg | Freq: Four times a day (QID) | RECTAL | Status: DC | PRN

## 2016-12-19 MED ORDER — ONDANSETRON HCL 4 MG PO TABS: 4.0000 mg | ORAL_TABLET | Freq: Four times a day (QID) | ORAL | Status: DC | PRN

## 2016-12-19 MED ORDER — ATOMOXETINE HCL 40 MG PO CAPS
80.0000 mg | ORAL_CAPSULE | Freq: Every day | ORAL | Status: DC
  Administered 2016-12-20 – 2016-12-22 (×3): 80 mg via ORAL
  Filled 2016-12-19 (×3): qty 2

## 2016-12-19 MED ORDER — VITAMIN B-1 100 MG PO TABS
100.0000 mg | ORAL_TABLET | Freq: Every day | ORAL | Status: DC
  Administered 2016-12-20 – 2016-12-22 (×3): 100 mg via ORAL
  Filled 2016-12-19 (×4): qty 1

## 2016-12-19 MED ORDER — ACETAMINOPHEN 325 MG PO TABS
650.0000 mg | ORAL_TABLET | Freq: Four times a day (QID) | ORAL | Status: DC | PRN
  Administered 2016-12-19 – 2016-12-20 (×2): 650 mg via ORAL
  Filled 2016-12-19 (×2): qty 2

## 2016-12-19 MED ORDER — LORAZEPAM 2 MG/ML IJ SOLN
0.0000 mg | Freq: Four times a day (QID) | INTRAMUSCULAR | Status: DC
  Administered 2016-12-19 – 2016-12-20 (×2): 2 mg via INTRAVENOUS
  Administered 2016-12-20: 4 mg via INTRAVENOUS
  Administered 2016-12-20: 2 mg via INTRAVENOUS
  Filled 2016-12-19: qty 2
  Filled 2016-12-19 (×3): qty 1

## 2016-12-19 MED ORDER — ADULT MULTIVITAMIN W/MINERALS CH
1.0000 | ORAL_TABLET | Freq: Every day | ORAL | Status: DC
  Administered 2016-12-20 – 2016-12-22 (×3): 1 via ORAL
  Filled 2016-12-19 (×4): qty 1

## 2016-12-19 MED ORDER — FOLIC ACID 1 MG PO TABS
1.0000 mg | ORAL_TABLET | Freq: Every day | ORAL | Status: DC
  Administered 2016-12-20 – 2016-12-22 (×3): 1 mg via ORAL
  Filled 2016-12-19 (×4): qty 1

## 2016-12-19 MED ORDER — ONDANSETRON HCL 4 MG/2ML IJ SOLN
4.0000 mg | Freq: Four times a day (QID) | INTRAMUSCULAR | Status: DC | PRN
  Administered 2016-12-19 – 2016-12-22 (×3): 4 mg via INTRAVENOUS
  Filled 2016-12-19 (×3): qty 2

## 2016-12-19 MED ORDER — PAROXETINE HCL 20 MG PO TABS
20.0000 mg | ORAL_TABLET | Freq: Every day | ORAL | Status: DC
  Administered 2016-12-20 – 2016-12-21 (×2): 20 mg via ORAL
  Filled 2016-12-19 (×3): qty 1

## 2016-12-19 MED ORDER — LORAZEPAM 2 MG/ML IJ SOLN: 1.0000 mg | Freq: Four times a day (QID) | INTRAMUSCULAR | Status: AC | PRN

## 2016-12-19 MED ORDER — IOPAMIDOL (ISOVUE-370) INJECTION 76%
75.0000 mL | Freq: Once | INTRAVENOUS | Status: AC | PRN
  Administered 2016-12-19: 75 mL via INTRAVENOUS

## 2016-12-19 MED ORDER — THIAMINE HCL 100 MG/ML IJ SOLN
100.0000 mg | Freq: Every day | INTRAMUSCULAR | Status: DC
  Administered 2016-12-19: 100 mg via INTRAVENOUS
  Filled 2016-12-19 (×2): qty 2

## 2016-12-19 MED ORDER — LORAZEPAM 2 MG/ML IJ SOLN: 0.0000 mg | Freq: Two times a day (BID) | INTRAMUSCULAR | Status: DC

## 2016-12-19 MED ORDER — BENZTROPINE MESYLATE 1 MG PO TABS
1.0000 mg | ORAL_TABLET | Freq: Every day | ORAL | Status: DC
  Administered 2016-12-19 – 2016-12-21 (×3): 1 mg via ORAL
  Filled 2016-12-19 (×4): qty 1

## 2016-12-19 MED ORDER — HEPARIN BOLUS VIA INFUSION
4500.0000 [IU] | Freq: Once | INTRAVENOUS | Status: AC
  Administered 2016-12-19: 4500 [IU] via INTRAVENOUS
  Filled 2016-12-19: qty 4500

## 2016-12-19 MED ORDER — LORAZEPAM 1 MG PO TABS
1.0000 mg | ORAL_TABLET | Freq: Four times a day (QID) | ORAL | Status: AC | PRN
  Administered 2016-12-19 – 2016-12-20 (×2): 1 mg via ORAL
  Filled 2016-12-19 (×2): qty 1

## 2016-12-19 MED ORDER — QUETIAPINE FUMARATE 25 MG PO TABS: 200.0000 mg | ORAL_TABLET | Freq: Every day | ORAL | Status: DC | PRN

## 2016-12-19 MED ORDER — ALBUTEROL SULFATE (2.5 MG/3ML) 0.083% IN NEBU: 5.0000 mg | INHALATION_SOLUTION | Freq: Once | RESPIRATORY_TRACT | Status: DC

## 2016-12-19 NOTE — ED Notes (Signed)
Went to do hourly rounding and found the patient to have a saturated bed of urine. She was able to be aroused easily and taken to the bathroom. When taking her back to the bed I placed her on the O2 monitor and her O2 sat was 83% on room air. Placed her on 4L [place].

## 2016-12-19 NOTE — Progress Notes (Signed)
ANTICOAGULATION CONSULT NOTE - Initial Consult  Pharmacy Consult for heparin dosing Indication: pulmonary embolus  Allergies  Allergen Reactions  . Other Other (See Comments)    Pt does not take narcotics, is on Suboxone.   Marland Kitchen Penicillins Rash and Other (See Comments)    Has patient had a PCN reaction causing immediate rash, facial/tongue/throat swelling, SOB or lightheadedness with hypotension: Yes Has patient had a PCN reaction causing severe rash involving mucus membranes or skin necrosis: No Has patient had a PCN reaction that required hospitalization No Has patient had a PCN reaction occurring within the last 10 years: No If all of the above answers are "NO", then may proceed with Cephalosporin use.    Patient Measurements: Height:  (157.5 cm) Weight: 200 lb (90.7 kg) IBW/kg (Calculated) : 50.1 Heparin Dosing Weight: 71kg  Vital Signs: Temp: 99.6 F (37.6 C) (09/27 0140) Temp Source: Oral (09/27 0140) BP: 142/88 (09/27 0900) Pulse Rate: 82 (09/27 0900)  Labs:  Recent Labs  12/19/16 0150  HGB 11.2*  HCT 33.9*  PLT 318  CREATININE 0.82  TROPONINI <0.03    Estimated Creatinine Clearance: 94.5 mL/min (by C-G formula based on SCr of 0.82 mg/dL).   Medical History: Past Medical History:  Diagnosis Date  . Abnormal Pap smear   . Alcohol abuse   . Bipolar 1 disorder (HCC)   . Depression   . Genital herpes 2000  . Hypertension   . Infection    UTI  . Ovarian cyst   . Schizoaffective disorder (HCC) 05/05/2016  . SVT (supraventricular tachycardia) [facility])     Assessment: Pharmacy consulted for heparin drip management for 41 yr female being treated for pulmonary embolism. No anticoagulation noted in PTA medications.   Goal of Therapy:  Heparin level 0.3-0.7 units/ml Monitor platelets by anticoagulation protocol: Yes   Plan:  Give 4500 units bolus x 1 Start heparin infusion at 1200 units/hr Check anti-Xa level in 6 hours and daily while on  heparin Continue to monitor H&H and platelets   Pharmacy will continue to monitor and adjust per consult.   Simpson,Michael L 12/19/2016,11:51 AM

## 2016-12-19 NOTE — H&P (Signed)
[facility] [place] at [facility]   PATIENT NAME: Megan Edwards    MR#:  [id number]  DATE OF BIRTH:  [birth year]  DATE OF ADMISSION:  12/19/2016  PRIMARY CARE PHYSICIAN: Pavelock, Duke Salvia, MD   REQUESTING/REFERRING PHYSICIAN: Dr. Sharman Cheek  CHIEF COMPLAINT:   Chief Complaint  Patient presents with  . Shortness of Breath    HISTORY OF PRESENT ILLNESS:  Megan Edwards  is a 41 y.o. female with a known history of alcohol abuse, polysubstance abuse, schizoaffective disorder with depression and anxiety, hypertension presents to the hospital from residential treatment program for alcohol detox secondary to hypoxia. Patient is pretty sedated at this time, unable to provide most of the history. Most of the stripper up to and from records and also ER physician. Patient was recently admitted to an outside hospital for alcohol withdrawal. She was treated and was referred to a residential treatment services program. She was at [facility] yesterday. She was noted to be hypoxic with saturations in the 60s on room air. She started noticing shortness of breath for almost 2 months now. It was worse when she was lying flat. No cough or fevers or chills. No recent travel. No recent illness. Chest x-ray did not reveal any acute abnormality. Patient remains hypoxic requiring 2-3L o2 here. CT chest revealed PE.  PAST MEDICAL HISTORY:   Past Medical History:  Diagnosis Date  . Abnormal Pap smear   . Alcohol abuse   . Bipolar 1 disorder (HCC)   . Depression   . Genital herpes 2000  . Hypertension   . Infection    UTI  . Ovarian cyst   . Schizoaffective disorder (HCC) 05/05/2016  . SVT (supraventricular tachycardia) (HCC)     PAST SURGICAL HISTORY:   Past Surgical History:  Procedure Laterality Date  . addenoidectomy    . BREAST ENHANCEMENT SURGERY    . BREAST SURGERY    . KNEE SURGERY    . KNEE SURGERY Left   . TONSILLECTOMY    . WISDOM TOOTH EXTRACTION       SOCIAL HISTORY:   Social History  Substance Use Topics  . Smoking status: Never Smoker  . Smokeless tobacco: Never Used  . Alcohol use 0.0 oz/week     Comment: 4 40's beer daily- not currently    FAMILY HISTORY:   Family History  Problem Relation Age of Onset  . Alcoholism Unknown   . Leukemia Unknown   . Cervical cancer Unknown   . Bone cancer Unknown   . Cirrhosis Unknown   . Diabetes Mellitus II Unknown   . Depression Unknown   . Hypertension Unknown   . Thyroid cancer Unknown   . Diabetes Mellitus II Mother   . Hypertension Mother   . Alcoholism Father   . Mental illness Paternal Grandmother     DRUG ALLERGIES:   Allergies  Allergen Reactions  . Other Other (See Comments)    Pt does not take narcotics, is on Suboxone.   Marland Kitchen Penicillins Rash and Other (See Comments)    Has patient had a PCN reaction causing immediate rash, facial/tongue/throat swelling, SOB or lightheadedness with hypotension: Yes Has patient had a PCN reaction causing severe rash involving mucus membranes or skin necrosis: No Has patient had a PCN reaction that required hospitalization No Has patient had a PCN reaction occurring within the last 10 years: No If all of the above answers are "NO", then may proceed with Cephalosporin use.    REVIEW  OF SYSTEMS:   Review of Systems  Unable to perform ROS: Mental status change    MEDICATIONS AT HOME:   Prior to Admission medications   Medication Sig Start Date End Date Taking? Authorizing Provider  atomoxetine (STRATTERA) 80 MG capsule Take 80 mg by mouth daily. 11/06/16  Yes [provider]  benztropine (COGENTIN) 1 MG tablet Take 1 tablet (1 mg total) by mouth at bedtime. For prevention drug related tremors. 05/13/16  Yes Armandina Stammer I, NP  buprenorphine-naloxone (SUBOXONE) 8-2 mg SUBL SL tablet Place 2 tablets under the tongue daily.    Yes [provider]  gabapentin (NEURONTIN) 100 MG capsule Take 200 mg by mouth at  bedtime. 12/10/16  Yes [provider]  labetalol (NORMODYNE) 100 MG tablet Take 1 tablet (100 mg) in the morning & 2 tablets (200 mg) in the evening: For high blood pressure 05/13/16  Yes Nwoko, Nicole Kindred I, NP  lithium carbonate 300 MG capsule Take 3 capsules (900 mg total) by mouth at bedtime. For mood stabilization 05/13/16  Yes Nwoko, Nicole Kindred I, NP  paliperidone (INVEGA) 9 MG 24 hr tablet Take 1 tablet (9 mg total) by mouth at bedtime. For mood control 05/13/16  Yes Nwoko, Nicole Kindred I, NP  PARoxetine (PAXIL) 20 MG tablet Take 20 mg by mouth daily. 12/10/16  Yes [provider]  prazosin (MINIPRESS) 2 MG capsule Take 1 capsule (2 mg total) by mouth at bedtime. For nightmares 05/13/16  Yes Armandina Stammer I, NP  QUEtiapine (SEROQUEL) 200 MG tablet Take 200 mg by mouth as needed (sleep).  07/03/16  Yes [provider]  zolpidem (AMBIEN) 10 MG tablet Take 10 mg by mouth at bedtime. 11/04/16  Yes [provider]      VITAL SIGNS:  Blood pressure (!) 142/88, pulse 82, temperature 99.6 F (37.6 C), temperature source Oral, resp. rate 15, height  (1.575 m), weight 90.7 kg (200 lb), SpO2 94 %, not currently breastfeeding.  PHYSICAL EXAMINATION:   Physical Exam  GENERAL:  41 y.o.-year-old patient lying in the bed with no acute distress.  EYES: Pupils equal, round, reactive to light and accommodation. No scleral icterus. Extraocular muscles intact.  HEENT: Head atraumatic, normocephalic. Oropharynx and nasopharynx clear.  NECK:  Supple, no jugular venous distention. No thyroid enlargement, no tenderness.  LUNGS: Normal breath sounds bilaterally, no wheezing, rales,rhonchi or crepitation. No use of accessory muscles of respiration.  CARDIOVASCULAR: S1, S2 normal. No murmurs, rubs, or gallops.  ABDOMEN: Soft, nontender, nondistended. Bowel sounds present. No organomegaly or mass.  EXTREMITIES: No pedal edema, cyanosis, or clubbing.  NEUROLOGIC: appears sedated, easily arousable  and following simple commands Able to move all extremities in bed  PSYCHIATRIC: The patient is sleepy, easily arousable oriented 2 SKIN: No obvious rash, lesion, or ulcer.   LABORATORY PANEL:   CBC  Recent Labs Lab 12/19/16 0150  WBC 10.6  HGB 11.2*  HCT 33.9*  PLT 318   ------------------------------------------------------------------------------------------------------------------  Chemistries   Recent Labs Lab 12/19/16 0150  NA 138  K 4.1  CL 100*  CO2 29  GLUCOSE 152*  BUN 7  CREATININE 0.82  CALCIUM 8.5*  AST 24  ALT 22  ALKPHOS 74  BILITOT 0.2*   ------------------------------------------------------------------------------------------------------------------  Cardiac Enzymes  Recent Labs Lab 12/19/16 0150  TROPONINI <0.03   ------------------------------------------------------------------------------------------------------------------  RADIOLOGY:  Dg Chest 2 View  Result Date: 12/19/2016 CLINICAL DATA:  Shortness of breath. EXAM: CHEST  2 VIEW COMPARISON:  Radiograph 09/07/2016 FINDINGS: Low lung volumes  limit assessment. There is bronchovascular crowding. Difficult to exclude mild vascular congestion. No consolidation, pleural fluid or pneumothorax. Heart size and mediastinal contours are normal for technique. No acute osseous abnormalities are seen. IMPRESSION: Low lung volumes with bronchovascular crowding. Difficult to exclude mild vascular congestion. Electronically Signed   By: Rubye Oaks M.D.   On: 12/19/2016 02:18   Ct Angio Chest Pe W And/or Wo Contrast  Result Date: 12/19/2016 CLINICAL DATA:  Shortness of Breath EXAM: CT ANGIOGRAPHY CHEST WITH CONTRAST TECHNIQUE: Multidetector CT imaging of the chest was performed using the standard protocol during bolus administration of intravenous contrast. Multiplanar CT image reconstructions and MIPs were obtained to evaluate the vascular anatomy. CONTRAST:  75 mL Isovue 370 nonionic COMPARISON:   Chest radiograph December 19, 2016 FINDINGS: Cardiovascular: There is an incompletely obstructing pulmonary embolus in the superior segment right lower lobe pulmonary artery. A similar incompletely obstructing pulmonary embolus is evident in the lateral segment right lower lobe pulmonary artery. No more central pulmonary emboli are evident. There is no evident right heart strain ; right ventricle to left ventricle diameter ratio is less than 0.9. There is no appreciable thoracic aortic aneurysm or dissection. Visualized great vessels appear unremarkable. Pericardium is not appreciably thickened. Mediastinum/Nodes: Thyroid appears unremarkable. There are scattered subcentimeter mediastinal lymph nodes. There is a mildly prominent sub- carinal lymph node measuring 1.5 x 1.1 cm. No evident esophageal lesion. Lungs/Pleura: On axial slice 28 series 6, there is a 4 mm nodular opacity in the anterior segment of the right upper lobe near the apex. On axial slice 33 series 6, there is a 2 mm nodular opacity in the anterior segment of the right upper lobe. On axial slice 35 series 6, there is a 4 mm nodular opacity in anterior segment right upper lobe. On axial slice 35 series 6, there is a 4 mm nodular opacity in the anterior segment of the left upper lobe. On axial slice 38 series 6, there is a 5 mm nodular opacity in the posterior segment of the left upper lobe. A second 5 mm nodular opacity is seen in the posterior segment of the left upper lobe on slice 38 series 6. A 4 mm nodular opacity is seen in the posterior segment of the right upper lobe on the slice. There is an 8 x 8 mm nodular opacity in the lateral segment right middle lobe seen on axial slice 41 series 6. There is a nodular opacity in the anterior segment of the right lower lobe measuring 9 x 8 mm on axial slice 46 series 6. There are multiple nodular lesions between 2 and 5 mm in each lung base. There is bibasilar atelectasis with mild bibasilar associated  consolidation. Upper Abdomen: Spleen is incompletely visualized but appears enlarged. Visualized upper abdominal structures otherwise appear unremarkable. Musculoskeletal: Breast implants noted bilaterally. No blastic or lytic bone lesions. Review of the MIP images confirms the above findings. IMPRESSION: 1. Incompletely obstructing pulmonary emboli in superior and lateral segmental right lower lobe pulmonary arteries. No more central pulmonary emboli seen. No right heart strain. 2. Multiple pulmonary nodular opacities scattered throughout the lungs, largest measuring 9 mm. Non-contrast chest CT at 3-6 months is recommended. If the nodules are stable at time of repeat CT, then future CT at 18-24 months (from today's scan) is considered optional for low-risk patients, but is recommended for high-risk patients. This recommendation follows the consensus statement: Guidelines for Management of Incidental Pulmonary Nodules Detected on CT Images: From the Fleischner  Society 2017; Radiology 2017; [phone]. None of these nodular lesions appear cavitated. 3. Bibasilar atelectasis with mild bibasilar consolidation. Question early pneumonia in the bases. 4. Mildly prominent sub- carinal lymph node. No other lymph node enlargement. Suspect reactive etiology given other changes in the lungs. Critical Value/emergent results were called by telephone at the time of interpretation on 12/19/2016 at 10:53 am to Dr. Sharman Cheek , who verbally acknowledged these results. Electronically Signed   By: Bretta Bang III M.D.   On: 12/19/2016 10:54    EKG:   Orders placed or performed during the hospital encounter of 12/19/16  . EKG 12-Lead  . EKG 12-Lead  . ED EKG  . ED EKG  . ED EKG  . ED EKG    IMPRESSION AND PLAN:   Amiah Frohlich  is a 41 y.o. female with a known history of alcohol abuse, polysubstance abuse, schizoaffective disorder with depression and anxiety, hypertension presents to the hospital from  residential treatment program for alcohol detox secondary to hypoxia.  #1 Acute hypoxic respiratory failure- secondary to pulmonary embolism - segemental PE in right lung, no saddle embolus, no right heart strain noted - check venous dopplers - hypercoagulable work up ordered - continue to wean o2 as tolerated - change to oral anticoagulation by tomorrow if stable  #2 Acute encephalopathy- on several psych meds- at high doses Psych consult for the same to titrate meds  #3 Polysubstance abuse and alcohol abuse- urine tox pending Alc level negative- last drink was2 days ago per patient - CIWA protocol - discharge to [facility] when medically stable  #4 schizoaffective- psych consult to help with meds Prior h/o psychoses present as well  #5 HTN- hold labetelol for now as BP is soft  #6 DVT Prophylaxis - already on heparin drip   All the records are reviewed and case discussed with ED provider. Management plans discussed with the patient, family and they are in agreement.  CODE STATUS: Full Code  TOTAL TIME TAKING CARE OF THIS PATIENT: 50 minutes.    Enid Baas M.D on 12/19/2016 at 11:50 AM  Between 7am to 6pm - Pager - [phone]  After 6pm go to [email or web address] - Social research officer, government  [facility]  Office  [phone]  CC: Primary care physician; Gilda Crease, MD

## 2016-12-19 NOTE — ED Provider Notes (Signed)
 -----------------------------------------   11:01 AM on 12/19/2016 ----------------------------------------- Clinical Course as of Dec 19 1101  Thu Dec 19, 2016  0521 Work up negative for acute findings. Patient remains obviously intoxicated. Will continue to monitor until clinically sober for transfer to [facility]  [CV]  0907 Still drowsy/slurred. Elevated pco2. Still hypoxic to 83% off o2, despite being awake for some time. Will give nebs, check CTA chest, plan to admit for persistent respiratory failure.   [PS]  1057 D/w radiology, postive for PE in [facility]. Will heparinize and admit.   [PS]    Clinical Course User Index [CV] Nita Sickle, MD [PS] Sharman Cheek, MD   CRITICAL CARE Performed by: Sharman Cheek   Total critical care time: 35 minutes  Critical care time was exclusive of separately billable procedures and treating other patients.  Critical care was necessary to treat or prevent imminent or life-threatening deterioration.  Critical care was time spent personally by me on the following activities: development of treatment plan with patient and/or surrogate as well as nursing, discussions with consultants, evaluation of patient's response to treatment, examination of patient, obtaining history from patient or surrogate, ordering and performing treatments and interventions, ordering and review of laboratory studies, ordering and review of radiographic studies, pulse oximetry and re-evaluation of patient's condition.    Final diagnoses:  SOB (shortness of breath)  Altered mental status, unspecified altered mental status type  Other acute pulmonary embolism without acute cor pulmonale (HCC)  Acute respiratory failure with hypoxia and hypercapnia (HCC)      Sharman Cheek, MD 12/19/16 1109

## 2016-12-19 NOTE — Progress Notes (Signed)
ANTICOAGULATION CONSULT NOTE - Initial Consult  Pharmacy Consult for heparin dosing Indication: pulmonary embolus  Allergies  Allergen Reactions  . Other Other ([place] Comments)    Pt does not take narcotics, is on Suboxone.   . Penicillins Rash and Other (See Comments)    Has patient had a PCN reaction causing immediate rash, facial/tongue/throat swelling, SOB or lightheadedness with hypotension: Yes Has patient had a PCN reaction causing severe rash involving mucus membranes or skin necrosis: No Has patient had a PCN reaction that required hospitalization No Has patient had a PCN reaction occurring within the last 10 years: No If all of the above answers are "NO", then may proceed with Cephalosporin use.    Patient Measurements: Height:  (157.5 cm) Weight: 200 lb (90.7 kg) IBW/kg (Calculated) : 50.1 Heparin Dosing Weight: 71kg  Vital Signs: BP: 136/86 (09/27 1130) Pulse Rate: 86 (09/27 1145)  Labs:  Recent Labs  12/19/16 0150 12/19/16 1817  HGB 11.2*  --   HCT 33.9*  --   PLT 318  --   APTT  --  49*  HEPARINUNFRC  --  0.25*  CREATININE 0.82  --   TROPONINI <0.03  --     Estimated Creatinine Clearance: 94.5 mL/min (by C-G formula based on SCr of 0.82 mg/dL).   Medical History: Past Medical History:  Diagnosis Date  . Abnormal Pap smear   . Alcohol abuse   . Bipolar 1 disorder (HCC)   . Depression   . Genital herpes 2000  . Hypertension   . Infection    UTI  . Ovarian cyst   . Schizoaffective disorder (HCC) 05/05/2016  . SVT (supraventricular tachycardia) [facility])     Assessment: Pharmacy consulted for heparin drip management for 41 yr female being treated for pulmonary embolism. No anticoagulation noted in PTA medications.   Goal of Therapy:  Heparin level 0.3-0.7 units/ml Monitor platelets by anticoagulation protocol: Yes   Plan:  Give 4500 units bolus x 1 Start heparin infusion at 1200 units/hr Check anti-Xa level in 6 hours and daily while on  heparin Continue to monitor H&H and platelets   9/27 1817 HL subtherapeutic x 1. 1050 units IV x 1 bolus and increase rate to 1350 units/hr. Will recheck HL in 6 hours.   Pharmacy will continue to monitor and adjust per consult.   Carola Frost, Pharm.D., BCPS Clinical Pharmacist 12/19/2016,7:42 PM

## 2016-12-19 NOTE — ED Notes (Signed)
Pt arousal with verbal simtuli, pt drowsy and sleeping while talking with this RN . Pt Vs stable.

## 2016-12-19 NOTE — ED Notes (Signed)
Assisted to ambulate from the stretcher to the commode so the patient could provide a urine sample; after sitting on the commode for approximately 10 minutes, pt states she is not able to urinate at this time; pt ambulated back to the stretcher and connected to monitoring equipment.

## 2016-12-19 NOTE — ED Provider Notes (Signed)
[facility] Emergency Department Provider Note  ____________________________________________  Time seen: Approximately 2:44 AM  I have reviewed the triage vital signs and the nursing notes.   HISTORY  Chief Complaint Shortness of Breath   HPI Megan Edwards is a 41 y.o. female with a history of bipolar disorder, hypertension, schizoaffective disorder, alcohol abuse, opiate abuse who presents from [facility] for shortness of breath. Patient reports that she presented to [facility] requesting detox from alcohol. Last drink was 3PM. Patient told them that she has been experience shortness of breath for several months and he was sent to the emergency room for evaluation. Patient is clearly intoxicated and falling asleep in the middle of my evaluation. She tells me that her shortness of breath is present only when she sleeps at night time. She denies shortness of breath with exertion, chest pain, personal or family history blood clots, ischemic heart disease, leg pain or swelling, hemoptysis, exogenous hormones, recent travel or immobilization. Patient reports that her shortness of breath has been ongoing for several months and she usually wakes up in the middle of the night gasping for air.  Past Medical History:  Diagnosis Date  . Abnormal Pap smear   . Bipolar 1 disorder (HCC)   . Depression   . Genital herpes 2000  . Hypertension   . Infection    UTI  . Ovarian cyst   . Schizoaffective disorder (HCC) 05/05/2016  . SVT (supraventricular tachycardia) [facility])     Patient Active Problem List   Diagnosis Date Noted  . Alcohol withdrawal (HCC) 12/01/2016  . ADD (attention deficit disorder) 05/10/2016  . Head ache 05/08/2016  . Opioid use disorder, mild, in controlled environment 05/06/2016  . Essential hypertension 05/06/2016  . Alcohol use disorder, moderate, in early remission, dependence (HCC) 05/06/2016  . Bipolar I disorder, most recent episode manic, severe with  psychotic features (HCC) 01/20/2016  . Chronic pain syndrome   . Morbid obesity due to excess calories [facility])     Past Surgical History:  Procedure Laterality Date  . addenoidectomy    . BREAST ENHANCEMENT SURGERY    . BREAST SURGERY    . KNEE SURGERY    . KNEE SURGERY Left   . TONSILLECTOMY    . WISDOM TOOTH EXTRACTION      Prior to Admission medications   Medication Sig Start Date End Date Taking? Authorizing Provider  atomoxetine (STRATTERA) 80 MG capsule Take 80 mg by mouth daily. 11/06/16   [provider]  benztropine (COGENTIN) 1 MG tablet Take 1 tablet (1 mg total) by mouth at bedtime. For prevention drug related tremors. 05/13/16   Armandina Stammer I, NP  buprenorphine-naloxone (SUBOXONE) 8-2 mg SUBL SL tablet Place 2 tablets under the tongue daily.     [provider]  gabapentin (NEURONTIN) 100 MG capsule Take 200 mg by mouth at bedtime. 12/10/16   [provider]  labetalol (NORMODYNE) 100 MG tablet Take 1 tablet (100 mg) in the morning & 2 tablets (200 mg) in the evening: For high blood pressure 05/13/16   Armandina Stammer I, NP  lithium carbonate 300 MG capsule Take 3 capsules (900 mg total) by mouth at bedtime. For mood stabilization 05/13/16   Armandina Stammer I, NP  paliperidone (INVEGA) 9 MG 24 hr tablet Take 1 tablet (9 mg total) by mouth at bedtime. For mood control 05/13/16   Armandina Stammer I, NP  PARoxetine (PAXIL) 20 MG tablet Take 20 mg by mouth daily. 12/10/16  [provider]  prazosin (MINIPRESS) 2 MG capsule Take 1 capsule (2 mg total) by mouth at bedtime. For nightmares 05/13/16   Armandina Stammer I, NP  QUEtiapine (SEROQUEL) 200 MG tablet Take 200 mg by mouth as needed (sleep).  07/03/16   [provider]  zolpidem (AMBIEN) 10 MG tablet Take 10 mg by mouth at bedtime. 11/04/16   [provider]    Allergies Other and Penicillins  Family History  Problem Relation Age of Onset  . Alcoholism Unknown   . Leukemia Unknown   .  Cervical cancer Unknown   . Bone cancer Unknown   . Cirrhosis Unknown   . Diabetes Mellitus II Unknown   . Depression Unknown   . Hypertension Unknown   . Thyroid cancer Unknown   . Diabetes Mellitus II Mother   . Hypertension Mother   . Alcoholism Father   . Mental illness Paternal Grandmother     Social History Social History  Substance Use Topics  . Smoking status: Never Smoker  . Smokeless tobacco: Never Used  . Alcohol use 0.0 oz/week     Comment: 4 40's beer daily- not currently    Review of Systems  Constitutional: Negative for fever. Eyes: Negative for visual changes. ENT: Negative for sore throat. Neck: No neck pain  Cardiovascular: Negative for chest pain. Respiratory: + shortness of breath. Gastrointestinal: Negative for abdominal pain, vomiting or diarrhea. Genitourinary: Negative for dysuria. Musculoskeletal: Negative for back pain. Skin: Negative for rash. Neurological: Negative for headaches, weakness or numbness. Psych: No SI or HI  ____________________________________________   PHYSICAL EXAM:  VITAL SIGNS: ED Triage Vitals  Enc Vitals Group     BP 12/19/16 0140 121/79     Pulse Rate 12/19/16 0140 (!) 101     Resp 12/19/16 0140 16     Temp 12/19/16 0140 99.6 F (37.6 C)     Temp Source 12/19/16 0140 Oral     SpO2 12/19/16 0126 90 %     Weight 12/19/16 0141 200 lb (90.7 kg)     Height 12/19/16 0141  (1.575 m)     Head Circumference --      Peak Flow --      Pain Score 12/19/16 0136 7     Pain Loc --      Pain Edu? --      Excl. in GC? --     Constitutional: patient looks intoxicated, somnolent, easily arousable, no distress HEENT:      Head: Normocephalic and atraumatic.         Eyes: Conjunctivae are normal. Sclera is non-icteric.       Mouth/Throat: Mucous membranes are moist.       Neck: Supple with no signs of meningismus. Cardiovascular: Regular rate and rhythm. No murmurs, gallops, or rubs. 2+ symmetrical distal pulses are  present in all extremities. No JVD. Respiratory: Normal respiratory effort. Lungs are clear to auscultation bilaterally. No wheezes, crackles, or rhonchi.  Gastrointestinal: Soft, non tender, and non distended with positive bowel sounds. No rebound or guarding. Genitourinary: No CVA tenderness. Musculoskeletal: Nontender with normal range of motion in all extremities. No edema, cyanosis, or erythema of extremities. Neurologic: Normal speech and language. Face is symmetric. Moving all extremities. No gross focal neurologic deficits are appreciated. Skin: Skin is warm, dry and intact. No rash noted. Psychiatric: Mood and affect are normal. Speech and behavior are normal.  ____________________________________________   LABS (all labs ordered are listed, but only abnormal results are  displayed)  Labs Reviewed  COMPREHENSIVE METABOLIC PANEL - Abnormal; Notable for the following:       Result Value   Chloride 100 (*)    Glucose, Bld 152 (*)    Calcium 8.5 (*)    Albumin 3.3 (*)    Total Bilirubin 0.2 (*)    All other components within normal limits  CBC WITH DIFFERENTIAL/PLATELET - Abnormal; Notable for the following:    Hemoglobin 11.2 (*)    HCT 33.9 (*)    RDW 15.6 (*)    Neutro Abs 9.3 (*)    Lymphs Abs 0.8 (*)    All other components within normal limits  TROPONIN I  ETHANOL  URINE DRUG SCREEN, QUALITATIVE ([facility] ONLY)   ____________________________________________  EKG  ED ECG REPORT I, Nita Sickle, the attending physician, personally viewed and interpreted this ECG.  Sinus tachycardia, rate of 101, normal intervals, normal axis, no ST elevations or depressions.  ____________________________________________  RADIOLOGY  CXR:  Low lung volumes with bronchovascular crowding. Difficult to exclude mild vascular congestion. ____________________________________________   PROCEDURES  Procedure(s) performed: None Procedures Critical Care performed:   None ____________________________________________   INITIAL IMPRESSION / ASSESSMENT AND PLAN / ED COURSE  41 y.o. female with a history of bipolar disorder, hypertension, schizoaffective disorder, alcohol abuse, opiate abuse who presents from [facility] for shortness of breath x several months. Patient sent here from [facility] for medical clearance for alcohol detox. based on patient's description and the fact that that she is obese I'm concerned the patient might have obstructive sleep apnea. I will recommend her to follow-up with primary care doctor for sleep study. Her symptoms only seem to be present at nighttime. She has no symptoms at this time. Her lungs are clear to auscultation. I do not anticipate a blood clot based on patient's history and the fact that symptoms are only present when she sleeps and not when she is awake. Chest x-ray with no acute findings. EKG with no abnormalities and no ischemia. Troponin is negative. CBC and CMP were no acute findings. We'll monitor until patient looks sober and discharge back to [facility].  Clinical Course as of Dec 20 714  Thu Dec 19, 2016  0521 Work up negative for acute findings. Patient remains obviously intoxicated. Will continue to monitor until clinically sober for transfer to [facility]  [CV]    Clinical Course User Index [CV] Don Perking [place], MD    _________________________ 7:15 AM on 12/19/2016 ----------------------------------------- Awaiting for patient to sober up for discharge. Care transferred to dr. Scotty Court  Pertinent labs & imaging results that were available during my care of the patient were reviewed by me and considered in my medical decision making (see chart for details).    ____________________________________________   FINAL CLINICAL IMPRESSION(S) / ED DIAGNOSES  Final diagnoses:  SOB (shortness of breath)  Altered mental status, unspecified altered mental status type      NEW MEDICATIONS STARTED DURING THIS VISIT:  New  Prescriptions   No medications on file     Note:  This document was prepared using Dragon voice recognition software and may include unintentional dictation errors.    Don Perking, [place], MD 12/19/16 [phone]

## 2016-12-19 NOTE — ED Triage Notes (Signed)
Pt presents to ED 13 via EMS from [facility] c/o shortness of breath; pt is visibly short of breath upon arrival, and has clammy skin and jerking limbs. Per EMS, pt was found to have 78%O2 sats when they arrived on scene on [place], when they put her on 2L via [place], O2 sats went up to 88-90%; pt at this time is on 4L via [place] and has O2 sats of 95%; pt c/o anxiety and states "I feel like I'm going to crawl out of my skin" pt is awake, alert and oriented x4.

## 2016-12-20 ENCOUNTER — Encounter: Payer: Self-pay | Admitting: Internal Medicine

## 2016-12-20 LAB — LUPUS ANTICOAGULANT PANEL
DRVVT: 32.2 s (ref 0.0–47.0)
PTT Lupus Anticoagulant: 41.4 s (ref 0.0–51.9)

## 2016-12-20 LAB — BASIC METABOLIC PANEL
Anion gap: 6 (ref 5–15)
BUN: 6 mg/dL (ref 6–20)
CHLORIDE: 101 mmol/L (ref 101–111)
CO2: 33 mmol/L — ABNORMAL HIGH (ref 22–32)
CREATININE: 0.67 mg/dL (ref 0.44–1.00)
Calcium: 8.3 mg/dL — ABNORMAL LOW (ref 8.9–10.3)
GFR calc Af Amer: >60 mL/min (ref >60)
GLUCOSE: 136 mg/dL — AB (ref 65–99)
POTASSIUM: 4.1 mmol/L (ref 3.5–5.1)
Sodium: 140 mmol/L (ref 135–145)

## 2016-12-20 LAB — CBC
HEMATOCRIT: 31.3 % — AB (ref 35.0–47.0)
Hemoglobin: 10.4 g/dL — ABNORMAL LOW (ref 12.0–16.0)
MCH: 28.1 pg (ref 26.0–34.0)
MCHC: 33.1 g/dL (ref 32.0–36.0)
MCV: 84.8 fL (ref 80.0–100.0)
Platelets: 306 10*3/uL (ref 150–440)
RBC: 3.7 MIL/uL — ABNORMAL LOW (ref 3.80–5.20)
RDW: 15.4 % — AB (ref 11.5–14.5)
WBC: 8.6 10*3/uL (ref 3.6–11.0)

## 2016-12-20 LAB — BETA-2-GLYCOPROTEIN I ABS, IGG/M/A: Beta-2-Glycoprotein I IgA: <9 GPI IgA units (ref 0–25)

## 2016-12-20 LAB — HOMOCYSTEINE: HOMOCYSTEINE-NORM: 5.4 umol/L (ref 0.0–15.0)

## 2016-12-20 LAB — PROTEIN S, TOTAL: PROTEIN S AG TOTAL: 142 % (ref 60–150)

## 2016-12-20 LAB — HEPARIN LEVEL (UNFRACTIONATED): Heparin Unfractionated: 0.23 IU/mL — ABNORMAL LOW (ref 0.30–0.70)

## 2016-12-20 LAB — PROTEIN S ACTIVITY: Protein S Activity: 52 % — ABNORMAL LOW (ref 63–140)

## 2016-12-20 LAB — PROTEIN C ACTIVITY: Protein C Activity: 107 % (ref 73–180)

## 2016-12-20 MED ORDER — HEPARIN BOLUS VIA INFUSION
1350.0000 [IU] | Freq: Once | INTRAVENOUS | Status: AC
  Administered 2016-12-20: 1350 [IU] via INTRAVENOUS
  Filled 2016-12-20: qty 1350

## 2016-12-20 MED ORDER — APIXABAN 5 MG PO TABS
10.0000 mg | ORAL_TABLET | Freq: Two times a day (BID) | ORAL | Status: DC
  Administered 2016-12-20 – 2016-12-22 (×5): 10 mg via ORAL
  Filled 2016-12-20 (×5): qty 2

## 2016-12-20 MED ORDER — CHLORDIAZEPOXIDE HCL 25 MG PO CAPS
25.0000 mg | ORAL_CAPSULE | Freq: Three times a day (TID) | ORAL | Status: DC
  Administered 2016-12-20 (×2): 25 mg via ORAL
  Filled 2016-12-20 (×2): qty 1

## 2016-12-20 MED ORDER — ELIQUIS 5 MG VTE STARTER PACK: ORAL_TABLET | ORAL | 0 refills | Status: DC

## 2016-12-20 MED ORDER — APIXABAN 5 MG PO TABS: 5.0000 mg | ORAL_TABLET | Freq: Two times a day (BID) | ORAL | Status: DC

## 2016-12-20 NOTE — Clinical Social Work Note (Addendum)
CSW attempted to see patient, however she was quite lethargic, CSW to attempt to see patient at a later time, patient was receiving treatment at [facility].  CSW spoke to [facility], they said patient will not be appropriate to return back.  CSW to continue to follow patient's progress throughout discharge planning.  Ervin Knack. Mayte Diers, MSW, Theresia Majors [phone]  12/20/2016 4:58 PM

## 2016-12-20 NOTE — Progress Notes (Signed)
ANTICOAGULATION CONSULT NOTE - Initial Consult  Pharmacy Consult for heparin dosing Indication: pulmonary embolus  Allergies  Allergen Reactions  . Other Other (See Comments)    Pt does not take narcotics, is on Suboxone.   Marland Kitchen Penicillins Rash and Other (See Comments)    Has patient had a PCN reaction causing immediate rash, facial/tongue/throat swelling, SOB or lightheadedness with hypotension: Yes Has patient had a PCN reaction causing severe rash involving mucus membranes or skin necrosis: No Has patient had a PCN reaction that required hospitalization No Has patient had a PCN reaction occurring within the last 10 years: No If all of the above answers are "NO", then may proceed with Cephalosporin use.    Patient Measurements: Height:  (157.5 cm) Weight: 200 lb (90.7 kg) IBW/kg (Calculated) : 50.1 Heparin Dosing Weight: 71kg  Vital Signs: Temp: 97.6 F (36.4 C) (09/28 0303) Temp Source: Oral (09/27 2001) BP: 102/50 (09/28 0303) Pulse Rate: 73 (09/28 0303)  Labs:  Recent Labs  12/19/16 0150 12/19/16 1817 12/20/16 0202  HGB 11.2*  --  10.4*  HCT 33.9*  --  31.3*  PLT 318  --  306  APTT  --  49*  --   HEPARINUNFRC  --  0.25* 0.23*  CREATININE 0.82  --  0.67  TROPONINI <0.03  --   --     Estimated Creatinine Clearance: 96.9 mL/min (by C-G formula based on SCr of 0.67 mg/dL).   Medical History: Past Medical History:  Diagnosis Date  . Abnormal Pap smear   . Alcohol abuse   . Bipolar 1 disorder (HCC)   . Depression   . Genital herpes 2000  . Hypertension   . Infection    UTI  . Ovarian cyst   . Schizoaffective disorder (HCC) 05/05/2016  . SVT (supraventricular tachycardia) [facility])     Assessment: Pharmacy consulted for heparin drip management for 41 yr female being treated for pulmonary embolism. No anticoagulation noted in PTA medications.   Goal of Therapy:  Heparin level 0.3-0.7 units/ml Monitor platelets by anticoagulation protocol: Yes   Plan:   Give 4500 units bolus x 1 Start heparin infusion at 1200 units/hr Check anti-Xa level in 6 hours and daily while on heparin Continue to monitor H&H and platelets   9/27 1817 HL subtherapeutic x 1. 1050 units IV x 1 bolus and increase rate to 1350 units/hr. Will recheck HL in 6 hours.   9/28 @ 0200 HL 0.23 subtherapeutic. Will rebolus w/ heparin 1350 units IV x 1 and will increase rate to 1500 units/hr and will recheck HL @ 0900.   Pharmacy will continue to monitor and adjust per consult.   Thomasene Ripple, Pharm.D., BCPS Clinical Pharmacist 12/20/2016,3:05 AM

## 2016-12-20 NOTE — Discharge Instructions (Signed)
Diet and activity as tolerated.

## 2016-12-20 NOTE — Progress Notes (Signed)
Patient resting quietly at this time, in no acute distress, 02 [place] 2 lit in use care ongoing ,

## 2016-12-20 NOTE — Progress Notes (Signed)
[facility] - [place] [facility]   PATIENT NAME: Megan Edwards    MR#:  [id number]  DATE OF BIRTH:  [birth year]  SUBJECTIVE:  CHIEF COMPLAINT:   Chief Complaint  Patient presents with  . Shortness of Breath   Drowzy but agitated at times. Withdrawing from alcohol On 3 L O2  REVIEW OF SYSTEMS:    Review of Systems  Unable to perform ROS: Mental status change    DRUG ALLERGIES:   Allergies  Allergen Reactions  . Other Other (See Comments)    Pt does not take narcotics, is on Suboxone.   Marland Kitchen Penicillins Rash and Other (See Comments)    Has patient had a PCN reaction causing immediate rash, facial/tongue/throat swelling, SOB or lightheadedness with hypotension: Yes Has patient had a PCN reaction causing severe rash involving mucus membranes or skin necrosis: No Has patient had a PCN reaction that required hospitalization No Has patient had a PCN reaction occurring within the last 10 years: No If all of the above answers are "NO", then may proceed with Cephalosporin use.    VITALS:  Blood pressure 120/88, pulse 87, temperature 97.8 F (36.6 C), temperature source Oral, resp. rate 20, height  (1.575 m), weight 90.7 kg (200 lb), SpO2 99 %, not currently breastfeeding.  PHYSICAL EXAMINATION:   Physical Exam  GENERAL:  41 y.o.-year-old patient lying in the bed . EYES: Pupils equal, round, reactive to light and accommodation. No scleral icterus. Extraocular muscles intact.  HEENT: Head atraumatic, normocephalic. Oropharynx and nasopharynx clear.  NECK:  Supple, no jugular venous distention. No thyroid enlargement, no tenderness.  LUNGS: Normal breath sounds bilaterally, no wheezing, rales, rhonchi. No use of accessory muscles of respiration.  CARDIOVASCULAR: S1, S2 normal. No murmurs, rubs, or gallops.  ABDOMEN: Soft, nontender, nondistended. Bowel sounds present. No organomegaly or mass.  EXTREMITIES: No cyanosis, clubbing or edema b/l.     NEUROLOGIC: Cranial nerves II through XII are intact. No focal Motor or sensory deficits b/l.   PSYCHIATRIC: The patient is drowzy and delirious SKIN: No obvious rash, lesion, or ulcer.   LABORATORY PANEL:   CBC  Recent Labs Lab 12/20/16 0202  WBC 8.6  HGB 10.4*  HCT 31.3*  PLT 306   ------------------------------------------------------------------------------------------------------------------ Chemistries   Recent Labs Lab 12/19/16 0150 12/20/16 0202  NA 138 140  K 4.1 4.1  CL 100* 101  CO2 29 33*  GLUCOSE 152* 136*  BUN 7 6  CREATININE 0.82 0.67  CALCIUM 8.5* 8.3*  AST 24  --   ALT 22  --   ALKPHOS 74  --   BILITOT 0.2*  --    ------------------------------------------------------------------------------------------------------------------  Cardiac Enzymes  Recent Labs Lab 12/19/16 0150  TROPONINI <0.03   ------------------------------------------------------------------------------------------------------------------  RADIOLOGY:  Dg Chest 2 View  Result Date: 12/19/2016 CLINICAL DATA:  Shortness of breath. EXAM: CHEST  2 VIEW COMPARISON:  Radiograph 09/07/2016 FINDINGS: Low lung volumes limit assessment. There is bronchovascular crowding. Difficult to exclude mild vascular congestion. No consolidation, pleural fluid or pneumothorax. Heart size and mediastinal contours are normal for technique. No acute osseous abnormalities are seen. IMPRESSION: Low lung volumes with bronchovascular crowding. Difficult to exclude mild vascular congestion. Electronically Signed   By: Rubye Oaks M.D.   On: 12/19/2016 02:18   Ct Angio Chest Pe W And/or Wo Contrast  Result Date: 12/19/2016 CLINICAL DATA:  Shortness of Breath EXAM: CT ANGIOGRAPHY CHEST WITH CONTRAST TECHNIQUE: Multidetector CT imaging of the chest was performed using the standard  protocol during bolus administration of intravenous contrast. Multiplanar CT image reconstructions and MIPs were obtained to  evaluate the vascular anatomy. CONTRAST:  75 mL Isovue 370 nonionic COMPARISON:  Chest radiograph December 19, 2016 FINDINGS: Cardiovascular: There is an incompletely obstructing pulmonary embolus in the superior segment right lower lobe pulmonary artery. A similar incompletely obstructing pulmonary embolus is evident in the lateral segment right lower lobe pulmonary artery. No more central pulmonary emboli are evident. There is no evident right heart strain ; right ventricle to left ventricle diameter ratio is less than 0.9. There is no appreciable thoracic aortic aneurysm or dissection. Visualized great vessels appear unremarkable. Pericardium is not appreciably thickened. Mediastinum/Nodes: Thyroid appears unremarkable. There are scattered subcentimeter mediastinal lymph nodes. There is a mildly prominent sub- carinal lymph node measuring 1.5 x 1.1 cm. No evident esophageal lesion. Lungs/Pleura: On axial slice 28 series 6, there is a 4 mm nodular opacity in the anterior segment of the right upper lobe near the apex. On axial slice 33 series 6, there is a 2 mm nodular opacity in the anterior segment of the right upper lobe. On axial slice 35 series 6, there is a 4 mm nodular opacity in anterior segment right upper lobe. On axial slice 35 series 6, there is a 4 mm nodular opacity in the anterior segment of the left upper lobe. On axial slice 38 series 6, there is a 5 mm nodular opacity in the posterior segment of the left upper lobe. A second 5 mm nodular opacity is seen in the posterior segment of the left upper lobe on slice 38 series 6. A 4 mm nodular opacity is seen in the posterior segment of the right upper lobe on the slice. There is an 8 x 8 mm nodular opacity in the lateral segment right middle lobe seen on axial slice 41 series 6. There is a nodular opacity in the anterior segment of the right lower lobe measuring 9 x 8 mm on axial slice 46 series 6. There are multiple nodular lesions between 2 and 5 mm  in each lung base. There is bibasilar atelectasis with mild bibasilar associated consolidation. Upper Abdomen: Spleen is incompletely visualized but appears enlarged. Visualized upper abdominal structures otherwise appear unremarkable. Musculoskeletal: Breast implants noted bilaterally. No blastic or lytic bone lesions. Review of the MIP images confirms the above findings. IMPRESSION: 1. Incompletely obstructing pulmonary emboli in superior and lateral segmental right lower lobe pulmonary arteries. No more central pulmonary emboli seen. No right heart strain. 2. Multiple pulmonary nodular opacities scattered throughout the lungs, largest measuring 9 mm. Non-contrast chest CT at 3-6 months is recommended. If the nodules are stable at time of repeat CT, then future CT at 18-24 months (from today's scan) is considered optional for low-risk patients, but is recommended for high-risk patients. This recommendation follows the consensus statement: Guidelines for Management of Incidental Pulmonary Nodules Detected on CT Images: From the Fleischner Society 2017; Radiology 2017; 284:228-243. None of these nodular lesions appear cavitated. 3. Bibasilar atelectasis with mild bibasilar consolidation. Question early pneumonia in the bases. 4. Mildly prominent sub- carinal lymph node. No other lymph node enlargement. Suspect reactive etiology given other changes in the lungs. Critical Value/emergent results were called by telephone at the time of interpretation on 12/19/2016 at 10:53 am to Dr. Sharman Cheek , who verbally acknowledged these results. Electronically Signed   By: Bretta Bang III M.D.   On: 12/19/2016 10:54   US Venous Img Lower Bilateral  Result Date:  12/19/2016 CLINICAL DATA:  Acute pulmonary embolism. Bilateral lower extremity swelling. EXAM: BILATERAL LOWER EXTREMITY VENOUS DOPPLER ULTRASOUND TECHNIQUE: Gray-scale sonography with graded compression, as well as color Doppler and duplex ultrasound were  performed to evaluate the lower extremity deep venous systems from the level of the common femoral vein and including the common femoral, femoral, profunda femoral, popliteal and calf veins including the posterior tibial, peroneal and gastrocnemius veins when visible. The superficial great saphenous vein was also interrogated. Spectral Doppler was utilized to evaluate flow at rest and with distal augmentation maneuvers in the common femoral, femoral and popliteal veins. COMPARISON:  None in PACs FINDINGS: RIGHT LOWER EXTREMITY Common Femoral Vein: No evidence of thrombus. Normal compressibility, respiratory phasicity and response to augmentation. Saphenofemoral Junction: No evidence of thrombus. Normal compressibility and flow on color Doppler imaging. Profunda Femoral Vein: No evidence of thrombus. Normal compressibility and flow on color Doppler imaging. Femoral Vein: No evidence of thrombus. Normal compressibility, respiratory phasicity and response to augmentation. Popliteal Vein: No evidence of thrombus. Normal compressibility, respiratory phasicity and response to augmentation. Calf Veins: No evidence of thrombus. Normal compressibility and flow on color Doppler imaging. Superficial Great Saphenous Vein: No evidence of thrombus. Normal compressibility and flow on color Doppler imaging. Venous Reflux:  None. Other Findings:  None. LEFT LOWER EXTREMITY Common Femoral Vein: No evidence of thrombus. Normal compressibility, respiratory phasicity and response to augmentation. Saphenofemoral Junction: No evidence of thrombus. Normal compressibility and flow on color Doppler imaging. Profunda Femoral Vein: No evidence of thrombus. Normal compressibility and flow on color Doppler imaging. Femoral Vein: No evidence of thrombus. Normal compressibility, respiratory phasicity and response to augmentation. Popliteal Vein: No evidence of thrombus. Normal compressibility, respiratory phasicity and response to augmentation.  Calf Veins: No evidence of thrombus. Normal compressibility and flow on color Doppler imaging. Superficial Great Saphenous Vein: No evidence of thrombus. Normal compressibility and flow on color Doppler imaging. Venous Reflux:  None. Other Findings:  None. IMPRESSION: No evidence of DVT within either lower extremity. Electronically Signed   By: David  [place] M.D.   On: 12/19/2016 14:47     ASSESSMENT AND PLAN:   Megan Edwards  is a 40 y.o. female with a known history of alcohol abuse, polysubstance abuse, schizoaffective disorder with depression and anxiety, hypertension presents to the hospital from residential treatment program for alcohol detox secondary to hypoxia.  # Alcohol withdrawal On CIWA protocol Delirious. Will add scheduled Librium and Dose of IV Ativan now  # Acute hypoxic respiratory failure- secondary to pulmonary embolism - segemental PE in right lung, no saddle embolus, no right heart strain noted - Lower ext venous dopplers - No DVT - hypercoagulable work up ordered -  wean o2 as tolerated - change to Eliquis  # Acute encephalopathy- on several psych meds- at high doses Psych consult  # Polysubstance abuse and alcohol abuse Alc level negative- last drink was 3  days ago per patient  # schizoaffective- psych consult to help with meds Prior h/o psychoses present as well  # HTN- hold labetelol for now as BP is soft  All the records are reviewed and case discussed with Care Management/Social Workerr. Management plans discussed with the patient, family and they are in agreement.  CODE STATUS: FULL CODE  DVT Prophylaxis: SCDs  TOTAL CC TIME TAKING CARE OF THIS PATIENT: 35 minutes.   POSSIBLE D/C IN 1-2 DAYS, DEPENDING ON CLINICAL CONDITION.  Milagros Loll R M.D on 12/20/2016 at 3:36 PM  Between 7am to 6pm -  Pager - [phone]  After 6pm go to [email or web address] - password [email or web address] [facility]  [facility] [place] [facility]  Office  [phone]  CC: Primary  care physician; Gilda Crease, MD  Note: This dictation was prepared with Dragon dictation along with smaller phrase technology. Any transcriptional errors that result from this process are unintentional.

## 2016-12-20 NOTE — Progress Notes (Signed)
ANTICOAGULATION CONSULT NOTE  Pharmacy Consult for apixaban Indication: pulmonary embolus  Allergies  Allergen Reactions  . Other Other (See Comments)    Pt does not take narcotics, is on Suboxone.   Marland Kitchen Penicillins Rash and Other (See Comments)    Has patient had a PCN reaction causing immediate rash, facial/tongue/throat swelling, SOB or lightheadedness with hypotension: Yes Has patient had a PCN reaction causing severe rash involving mucus membranes or skin necrosis: No Has patient had a PCN reaction that required hospitalization No Has patient had a PCN reaction occurring within the last 10 years: No If all of the above answers are "NO", then may proceed with Cephalosporin use.    Patient Measurements: Height:  (157.5 cm) Weight: 200 lb (90.7 kg) IBW/kg (Calculated) : 50.1   Vital Signs: Temp: 97.8 F (36.6 C) (09/28 0809) Temp Source: Oral (09/28 0809) BP: 120/88 (09/28 0809) Pulse Rate: 87 (09/28 0809)  Labs:  Recent Labs  12/19/16 0150 12/19/16 1817 12/20/16 0202  HGB 11.2*  --  10.4*  HCT 33.9*  --  31.3*  PLT 318  --  306  APTT  --  49*  --   HEPARINUNFRC  --  0.25* 0.23*  CREATININE 0.82  --  0.67  TROPONINI <0.03  --   --     Estimated Creatinine Clearance: 96.9 mL/min (by C-G formula based on SCr of 0.67 mg/dL).   Medical History: Past Medical History:  Diagnosis Date  . Abnormal Pap smear   . Alcohol abuse   . Bipolar 1 disorder (HCC)   . Depression   . Genital herpes 2000  . Hypertension   . Infection    UTI  . Ovarian cyst   . Schizoaffective disorder (HCC) 05/05/2016  . SVT (supraventricular tachycardia) [facility])     Assessment: 41 y/o F admitted with PE on heparin drip to transition to Eliquis.   Plan:  Eliquis 10 mg bid x 7 days then 5 mg bid thereafter. RN instructed to d/c heparin drip at time of first Eliquis dose.   Luisa Hart D 12/20/2016,9:35 AM

## 2016-12-20 NOTE — Care Management (Signed)
During progression discussed the need to aggressively attempt to wean oxygen.  Requiring supplemental oxygen could hinder patient's return to [facility].  Confirmed through [facility] that patient has active medicaid with drug coverage.  Eliquis is on the medicaid formulary as a preferred medication that will not require prior authorization

## 2016-12-21 DIAGNOSIS — F312 Bipolar disorder, current episode manic severe with psychotic features: Secondary | ICD-10-CM

## 2016-12-21 LAB — LITHIUM LEVEL: Lithium Lvl: 0.64 mmol/L (ref 0.60–1.20)

## 2016-12-21 LAB — GLUCOSE, CAPILLARY: Glucose-Capillary: 113 mg/dL — ABNORMAL HIGH (ref 65–99)

## 2016-12-21 LAB — PROTEIN C, TOTAL: PROTEIN C, TOTAL: 104 % (ref 60–150)

## 2016-12-21 MED ORDER — CHLORDIAZEPOXIDE HCL 5 MG PO CAPS: 5.0000 mg | ORAL_CAPSULE | Freq: Three times a day (TID) | ORAL | Status: DC

## 2016-12-21 MED ORDER — LABETALOL HCL 100 MG PO TABS
100.0000 mg | ORAL_TABLET | Freq: Two times a day (BID) | ORAL | Status: DC
  Administered 2016-12-21 – 2016-12-22 (×3): 100 mg via ORAL
  Filled 2016-12-21 (×4): qty 1

## 2016-12-21 MED ORDER — HALOPERIDOL LACTATE 5 MG/ML IJ SOLN: 1.0000 mg | Freq: Four times a day (QID) | INTRAMUSCULAR | Status: DC | PRN

## 2016-12-21 NOTE — Progress Notes (Signed)
Patient has been drowsy and sleeping throughout this shift but was able to take her oral medications without any difficulties. No acute distress noted. CIWA checks continues without any withdrawal symptoms. No Ativan was  administered. Will continue to monitor.

## 2016-12-21 NOTE — Progress Notes (Signed)
CONCERNING: IV to Oral Route Change Policy  RECOMMENDATION: This patient is receiving thiamine by the intravenous route.  Based on criteria approved by the Pharmacy and Therapeutics Committee, the intravenous medication(s) is/are being converted to the equivalent oral dose form(s).   DESCRIPTION: These criteria include:  The patient is eating (either orally or via tube) and/or has been taking other orally administered medications for a least 24 hours  The patient has no evidence of active gastrointestinal bleeding or impaired GI absorption (gastrectomy, short bowel, patient on TNA or NPO).  If you have questions about this conversion, please contact the Pharmacy Department    [phone] )  [facility]   [phone] )  [facility]   [phone] )  [facility]   [phone] )  [facility]   [phone] )  [facility]   Gilmar Bua L, [facility] 12/21/2016 2:15 PM

## 2016-12-21 NOTE — Plan of Care (Signed)
Problem: Education: Goal: Knowledge of [facility] Education information/materials will improve Outcome: Not Progressing Patient shows no understanding of information.   Problem: Safety: Goal: Ability to remain free from injury will improve Outcome: Progressing Patient has no injury thus far.   Problem: Health Behavior/Discharge Planning: Goal: Ability to manage health-related needs will improve Outcome: Not Progressing Patient has been asleep all day.   Problem: Pain Managment: Goal: General experience of comfort will improve Outcome: Not Progressing Patient has been asleep all day.   Problem: Physical Regulation: Goal: Ability to maintain clinical measurements within normal limits will improve Outcome: Not Progressing Patient has been asleep all day.  Goal: Will remain free from infection Outcome: Not Progressing Patient has been asleep all day.   Problem: Skin Integrity: Goal: Risk for impaired skin integrity will decrease Outcome: Not Progressing Patient has been asleep all day.   Problem: Tissue Perfusion: Goal: Risk factors for ineffective tissue perfusion will decrease Outcome: Not Progressing Patient has been asleep all day.   Problem: Activity: Goal: Risk for activity intolerance will decrease Outcome: Not Progressing Patient has been asleep all day.   Problem: Fluid Volume: Goal: Ability to maintain a balanced intake and output will improve Outcome: Not Progressing Patient has been asleep all day.   Problem: Nutrition: Goal: Adequate nutrition will be maintained Outcome: Not Progressing Patient has been asleep all day.   Problem: Bowel/Gastric: Goal: Will not experience complications related to bowel motility Outcome: Not Progressing Patient has been asleep all day.   Comments: Patient has been asleep all day. Patient has woken up for meds, but then went back to sleep.

## 2016-12-21 NOTE — Consult Note (Signed)
[place] [facility] Consult   Reason for Consult:  Consult for 41 year old woman with a history of mental health and substance abuse problems for management of medication Referring Physician:  Tressia Miners Patient Identification: FINOLA ROSAL MRN:  [id number] Principal Diagnosis: Bipolar I disorder, most recent episode manic, severe with psychotic features [facility]) Diagnosis:   Patient Active Problem List   Diagnosis Date Noted  . Pulmonary embolism ([place]) [I26.99] 12/19/2016  . Alcohol withdrawal ([place]) [F10.239] 12/01/2016  . ADD (attention deficit disorder) [F98.8] 05/10/2016  . Head ache [R51] 05/08/2016  . Opioid use disorder, mild, in controlled environment [F11.10] 05/06/2016  . Essential hypertension [I10] 05/06/2016  . Alcohol use disorder, moderate, in early remission, dependence ([place]) [F10.21] 05/06/2016  . Bipolar I disorder, most recent episode manic, severe with psychotic features ([place]) [F31.2] 01/20/2016  . Chronic pain syndrome [G89.4]   . Morbid obesity due to excess calories ([place]) [E66.01]     Total Time spent with patient: 45 minutes  Subjective:   HOMER PFEIFER is a 41 y.o. female patient admitted with patient not able to give any information.  HPI:  This is a 41 year old woman with a history of mental health problems who presented to the emergency room with acute respiratory distress. Turns out she had a pulmonary embolism. She is now being treated for that and gradually recovering. Patient is not able to give me any history or information. She came from residential treatment services where she had apparently presented for detox from alcohol. This would be consistent with the presentation she had at [facility] behavioral health about a week ago. Currently the patient is sedated and in bed. Staff tells me she's been sleeping all day and has not been interactive although she had been irritable previously. Patient has been ordered all of her usual medicines. I'm not  sure if any of them are currently being administered normally since she seems to have altered mental status. Review of the chart suggests that her most recent medicines were most likely the Strattera, Cogentin, lithium,Invega, prazosin and possibly Ambien.. Looks like in a recent note she had said that Paxil caused a side effect of swelling for her and had been discontinued. Patient had been taken off Seroquel months ago in a previous admission. Apparently she had presented to our TS for alcohol detox. Doesn't appear that she was in DTs and there is no report of a seizure.  Social history: Patient had been in residential treatment services. Has family in the area that she can often rely on. She is either separated or divorced. I don't think she is working currently.  Medical history: Acute pulmonary embolism. History of headaches and chronic pain and obesity  Substance abuse history: Long-standing alcohol abuse with recent relapse. No clear history of delirium tremens in the past. Patient has opiate dependence and is maintained on Suboxone. I verified that she continues to be prescribed this at 16 mg a day  Past Psychiatric History: Patient has a past diagnosis of either bipolar or schizoaffective disorder although it is also combined with her substance abuse issues. Multiple medications have been tried. Also has a history of ADD. Most recent admission and it looks like the Seroquel was discontinued and she was left on lithium Strattera in [place] Cogentin prazosin and possibly Ambien although it doesn't look like she is necessarily being prescribed that regularly right now. Does have a history of suicidality and some agitation in the past. Some history of psychosis in the past.  Risk to  Self: Is patient at risk for suicide?: No Risk to Others:   Prior Inpatient Therapy:   Prior Outpatient Therapy:    Past Medical History:  Past Medical History:  Diagnosis Date  . Abnormal Pap smear   . Alcohol abuse    . Bipolar 1 disorder ([place])   . Depression   . Genital herpes 2000  . Hypertension   . Infection    UTI  . Ovarian cyst   . Schizoaffective disorder ([place]) 05/05/2016  . SVT (supraventricular tachycardia) ([facility])     Past Surgical History:  Procedure Laterality Date  . addenoidectomy    . BREAST ENHANCEMENT SURGERY    . BREAST SURGERY    . KNEE SURGERY    . KNEE SURGERY Left   . TONSILLECTOMY    . WISDOM TOOTH EXTRACTION     Family History:  Family History  Problem Relation Age of Onset  . Alcoholism Unknown   . Leukemia Unknown   . Cervical cancer Unknown   . Bone cancer Unknown   . Cirrhosis Unknown   . Diabetes Mellitus II Unknown   . Depression Unknown   . Hypertension Unknown   . Thyroid cancer Unknown   . Diabetes Mellitus II Mother   . Hypertension Mother   . Alcoholism Father   . Mental illness Paternal Grandmother    Family Psychiatric  History: Nonidentified Social History:  History  Alcohol Use  . 0.0 oz/week    Comment: 4 40's beer daily- not currently     History  Drug Use  . Types: Methamphetamines    Comment: former-opiate use    Social History   Social History  . Marital status: Divorced    Spouse name: N/A  . Number of children: N/A  . Years of education: N/A   Occupational History  . [employer]   Social History Main Topics  . Smoking status: Never Smoker  . Smokeless tobacco: Never Used  . Alcohol use 0.0 oz/week     Comment: 4 40's beer daily- not currently  . Drug use: Yes    Types: Methamphetamines     Comment: former-opiate use  . Sexual activity: Not Currently    Birth control/ protection: Abstinence, None   Other Topics Concern  . None   Social History Narrative  . None   Additional Social History:    Allergies:   Allergies  Allergen Reactions  . Other Other (See Comments)    Pt does not take narcotics, is on Suboxone.   Marland Kitchen Penicillins Rash and Other (See Comments)    Has patient had a PCN reaction  causing immediate rash, facial/tongue/throat swelling, SOB or lightheadedness with hypotension: Yes Has patient had a PCN reaction causing severe rash involving mucus membranes or skin necrosis: No Has patient had a PCN reaction that required hospitalization No Has patient had a PCN reaction occurring within the last 10 years: No If all of the above answers are "NO", then may proceed with Cephalosporin use.    Labs:  Results for orders placed or performed during the hospital encounter of 12/19/16 (from the past 48 hour(s))  APTT     Status: Abnormal   Collection Time: 12/19/16  6:17 PM  Result Value Ref Range   aPTT 49 (H) 24 - 36 seconds    Comment:        IF BASELINE aPTT IS ELEVATED, SUGGEST PATIENT RISK ASSESSMENT BE USED TO DETERMINE APPROPRIATE ANTICOAGULANT THERAPY.   Antithrombin III  Status: None   Collection Time: 12/19/16  6:17 PM  Result Value Ref Range   AntiThromb III Func 89 75 - 120 %    Comment: Performed at [facility] [address], [place], [place] [address]  Protein C activity     Status: None   Collection Time: 12/19/16  6:17 PM  Result Value Ref Range   Protein C Activity 107 73 - 180 %    Comment: (NOTE) Performed At: [facility] [address] [place], [place] [id number] Lindon Romp MD [id number]   Protein C, total     Status: None   Collection Time: 12/19/16  6:17 PM  Result Value Ref Range   Protein C, Total 104 60 - 150 %    Comment: (NOTE) Performed At: [facility] [place], [place] [id number] Lindon Romp MD [id number]   Protein S activity     Status: Abnormal   Collection Time: 12/19/16  6:17 PM  Result Value Ref Range   Protein S Activity 52 (L) 63 - 140 %    Comment: (NOTE) A deficiency of protein S (PS), either congenital or acquired, increases the risk of thromboembolism. PS activity levels may be falsely low in individuals with APCR/Factor V Leiden. Consider performing free  protein S antigen in those with APCR/Factor V Leiden before making a diagnosis of protein S deficiency. Acquired PS deficiency is more common than congenital deficiency. PS values decrease with normal pregnancy, and are also dependent on age, sex and hormone status. PS values tend to be lower in a younger age group and lower in women than in men. Levels may be decreased in pre-menopausal women on oral contraceptive agents. Acquired deficiency can occur as a result of vitamin K deficiency or antagonism, severe hepatic disorders, (hepatitis, cirrhosis, etc.), nephrotic syndrome, inflammatory bowel disease, certain chemotherapeutic agents, L-asparaginse therapy, sepsis, disseminated intravascular coagulation (DIC) and acute thrombosis. Levels may be decreased in  patients with polycythemia vera, sickle cell disease and essential thrombocythemia. Repeat evaluation on a new plasma sample to confirm or refute this result should be considered, after ruling out acquired causes, depending on the clinical scenario. Performed At: [facility] [place], [place] [id number] Lindon Romp MD [id number]   Protein S, total     Status: None   Collection Time: 12/19/16  6:17 PM  Result Value Ref Range   Protein S Ag, Total 142 60 - 150 %    Comment: (NOTE) This test was developed and its performance characteristics determined by [facility]. It has not been cleared or approved by the Food and Drug Administration. Performed At: [facility] [place], [place] [id number] Lindon Romp MD [id number]   Lupus anticoagulant panel     Status: None   Collection Time: 12/19/16  6:17 PM  Result Value Ref Range   PTT Lupus Anticoagulant 41.4 0.0 - 51.9 sec   DRVVT 32.2 0.0 - 47.0 sec   Lupus Anticoag Interp Comment:     Comment: (NOTE) No lupus anticoagulant was detected. Performed At: [facility] [place], [place]  [id number] Lindon Romp MD [id number]   Beta-2-glycoprotein i abs, IgG/M/A     Status: None   Collection Time: 12/19/16  6:17 PM  Result Value Ref Range   Beta-2 Glyco I IgG <9 0 - 20 GPI IgG units    Comment: (NOTE) The reference interval reflects a 3SD or 99th percentile interval, which is  thought to represent a potentially clinically significant result in accordance with the International Consensus Statement on the classification criteria for definitive antiphospholipid syndrome (APS). J Thromb Haem 2006;4:295-306.    Beta-2-Glycoprotein I IgM <9 0 - 32 GPI IgM units    Comment: (NOTE) The reference interval reflects a 3SD or 99th percentile interval, which is thought to represent a potentially clinically significant result in accordance with the International Consensus Statement on the classification criteria for definitive antiphospholipid syndrome (APS). J Thromb Haem 2006;4:295-306. Performed At: [facility] [place], [place] [id number] Lindon Romp MD [id number]    Beta-2-Glycoprotein I IgA <9 0 - 25 GPI IgA units    Comment: (NOTE) The reference interval reflects a 3SD or 99th percentile interval, which is thought to represent a potentially clinically significant result in accordance with the International Consensus Statement on the classification criteria for definitive antiphospholipid syndrome (APS). J Thromb Haem 2006;4:295-306.   Homocysteine, serum     Status: None   Collection Time: 12/19/16  6:17 PM  Result Value Ref Range   Homocysteine 5.4 0.0 - 15.0 umol/L    Comment: (NOTE) Performed At: [facility] [place], [place] [id number] Lindon Romp MD [id number]   Heparin level (unfractionated)     Status: Abnormal   Collection Time: 12/19/16  6:17 PM  Result Value Ref Range   Heparin Unfractionated 0.25 (L) 0.30 - 0.70 IU/mL    Comment:        IF HEPARIN RESULTS ARE BELOW EXPECTED VALUES,  AND PATIENT DOSAGE HAS BEEN CONFIRMED, SUGGEST FOLLOW UP TESTING OF ANTITHROMBIN III LEVELS.   Basic metabolic panel     Status: Abnormal   Collection Time: 12/20/16  2:02 AM  Result Value Ref Range   Sodium 140 135 - 145 mmol/L   Potassium 4.1 3.5 - 5.1 mmol/L   Chloride 101 101 - 111 mmol/L   CO2 33 (H) 22 - 32 mmol/L   Glucose, Bld 136 (H) 65 - 99 mg/dL   BUN 6 6 - 20 mg/dL   Creatinine, Ser 0.67 0.44 - 1.00 mg/dL   Calcium 8.3 (L) 8.9 - 10.3 mg/dL   GFR calc non Af Amer >60 >60 mL/min   GFR calc Af Amer >60 >60 mL/min    Comment: (NOTE) The eGFR has been calculated using the CKD EPI equation. This calculation has not been validated in all clinical situations. eGFR's persistently <60 mL/min signify possible Chronic Kidney Disease.    Anion gap 6 5 - 15  CBC     Status: Abnormal   Collection Time: 12/20/16  2:02 AM  Result Value Ref Range   WBC 8.6 3.6 - 11.0 K/uL   RBC 3.70 (L) 3.80 - 5.20 MIL/uL   Hemoglobin 10.4 (L) 12.0 - 16.0 g/dL   HCT 31.3 (L) 35.0 - 47.0 %   MCV 84.8 80.0 - 100.0 fL   MCH 28.1 26.0 - 34.0 pg   MCHC 33.1 32.0 - 36.0 g/dL   RDW 15.4 (H) 11.5 - 14.5 %   Platelets 306 150 - 440 K/uL  Heparin level (unfractionated)     Status: Abnormal   Collection Time: 12/20/16  2:02 AM  Result Value Ref Range   Heparin Unfractionated 0.23 (L) 0.30 - 0.70 IU/mL    Comment:        IF HEPARIN RESULTS ARE BELOW EXPECTED VALUES, AND PATIENT DOSAGE HAS BEEN CONFIRMED, SUGGEST FOLLOW UP TESTING OF ANTITHROMBIN III LEVELS.   Lithium level  Status: None   Collection Time: 12/21/16  2:42 PM  Result Value Ref Range   Lithium Lvl 0.64 0.60 - 1.20 mmol/L    Current Facility-Administered Medications  Medication Dose Route Frequency Provider Last Rate Last Dose  . acetaminophen (TYLENOL) tablet 650 mg  650 mg Oral Q6H PRN Gladstone Lighter, MD   650 mg at 12/20/16 0743   Or  . acetaminophen (TYLENOL) suppository 650 mg  650 mg Rectal Q6H PRN Gladstone Lighter,  MD      . apixaban (ELIQUIS) tablet 10 mg  10 mg Oral BID Napoleon Form, RPH   10 mg at 12/21/16 1156   Followed by  . [START ON 12/27/2016] apixaban (ELIQUIS) tablet 5 mg  5 mg Oral BID Napoleon Form, RPH      . atomoxetine (STRATTERA) capsule 80 mg  80 mg Oral Daily Gladstone Lighter, MD   80 mg at 12/21/16 1200  . benztropine (COGENTIN) tablet 1 mg  1 mg Oral QHS Gladstone Lighter, MD   1 mg at 12/20/16 2134  . buprenorphine-naloxone (SUBOXONE) 8-2 mg per SL tablet 2 tablet  2 tablet Sublingual Daily Gladstone Lighter, MD   2 tablet at 12/21/16 1157  . folic acid (FOLVITE) tablet 1 mg  1 mg Oral Daily Gladstone Lighter, MD   1 mg at 12/21/16 1157  . gabapentin (NEURONTIN) capsule 200 mg  200 mg Oral QHS Gladstone Lighter, MD   200 mg at 12/20/16 2133  . labetalol (NORMODYNE) tablet 100 mg  100 mg Oral BID Hillary Bow, MD      . lithium carbonate capsule 900 mg  900 mg Oral QHS Gladstone Lighter, MD   900 mg at 12/20/16 2134  . LORazepam (ATIVAN) tablet 1 mg  1 mg Oral Q6H PRN Gladstone Lighter, MD   1 mg at 12/20/16 0815   Or  . LORazepam (ATIVAN) injection 1 mg  1 mg Intravenous Q6H PRN Gladstone Lighter, MD      . multivitamin with minerals tablet 1 tablet  1 tablet Oral Daily Gladstone Lighter, MD   1 tablet at 12/21/16 1156  . ondansetron (ZOFRAN) tablet 4 mg  4 mg Oral Q6H PRN Gladstone Lighter, MD       Or  . ondansetron (ZOFRAN) injection 4 mg  4 mg Intravenous Q6H PRN Gladstone Lighter, MD   4 mg at 12/19/16 1233  . paliperidone (INVEGA) 24 hr tablet 9 mg  9 mg Oral QHS Gladstone Lighter, MD   9 mg at 12/20/16 2134  . prazosin (MINIPRESS) capsule 2 mg  2 mg Oral QHS Gladstone Lighter, MD   2 mg at 12/20/16 2135  . thiamine (VITAMIN B-1) tablet 100 mg  100 mg Oral Daily Gladstone Lighter, MD   100 mg at 12/21/16 1157    Musculoskeletal: Strength & Muscle Tone: within normal limits Gait & Station: unable to stand Patient leans: N/A  Psychiatric Specialty  Exam: Physical Exam  Nursing note and vitals reviewed. Constitutional: She appears well-developed and well-nourished.  HENT:  Head: Normocephalic and atraumatic.  Eyes: Pupils are equal, round, and reactive to light. Conjunctivae are normal.  Neck: Normal range of motion.  Cardiovascular: Regular rhythm and normal heart sounds.   Respiratory: She is in respiratory distress.  GI: Soft.  Musculoskeletal: Normal range of motion.  Neurological: She is alert.  Skin: Skin is warm and dry.  Psychiatric: Her affect is blunt. Cognition and memory are impaired. She is noncommunicative.    Review of Systems  Unable to perform ROS: Mental status change    Blood pressure (!) 158/95, pulse (!) 105, temperature 98.7 F (37.1 C), temperature source Oral, resp. rate 18, height '5\' 2"'$  (1.575 m), weight 90.7 kg (200 lb), SpO2 97 %, not currently breastfeeding.Body mass index is 36.58 kg/m.  General Appearance: Casual  Eye Contact:  None  Speech:  Negative  Volume:  Decreased  Mood:  Negative  Affect:  Negative  Thought Process:  NA  Orientation:  Negative  Thought Content:  Negative  Suicidal Thoughts:  No  Homicidal Thoughts:  No  Memory:  Negative  Judgement:  Negative  Insight:  Negative  Psychomotor Activity:  Negative  Concentration:  Concentration: Negative  Recall:  Negative  Fund of Knowledge:  Negative  Language:  Negative  Akathisia:  Negative  Handed:  Right  AIMS (if indicated):     Assets:  Resilience Social Support  ADL's:  Impaired  Cognition:  Impaired,  Severe  Sleep:        Treatment Plan Summary: Daily contact with patient to assess and evaluate symptoms and progress in treatment, Medication management and Plan Reviewing her medicines I discontinued the Strattera and Paxil which I don't think are probably currently prescribed. There is no way to substitute an equivalent medicine for the oral Strattera however if she cannot swallow it there will be no withdrawal  problems. The lithium can be given down to 2 or it is usually fairly easy to swallow. Same thing for the in [place]. It looks like her lithium level was 0.62 on presentation which is appropriate no sign of toxicity. Currently she is sedated. I am going to make sure that we have some orders for some when necessary medicine if she wakes up and becomes agitated. Given her history of alcohol withdrawal recently it would probably be better to have Ativan as a detox medicine but I also don't want to over sedate her so I will limit the amount of that and have Haldol available to the can be given if she is deliriously agitated. I will follow-up as she is in the hospital.  Disposition: No evidence of imminent risk to self or others at present.   Patient does not meet criteria for psychiatric inpatient admission.  Alethia Berthold, MD 12/21/2016 4:02 PM

## 2016-12-21 NOTE — Progress Notes (Signed)
[facility] - [place] at [facility]   PATIENT NAME: Megan Edwards    MR#:  [id number]  DATE OF BIRTH:  [birth year]  SUBJECTIVE:  CHIEF COMPLAINT:   Chief Complaint  Patient presents with  . Shortness of Breath   Drowsy. Opens her eyes on calling her name. Confused. Off oxygen. REVIEW OF SYSTEMS:    Review of Systems  Unable to perform ROS: Mental status change    DRUG ALLERGIES:   Allergies  Allergen Reactions  . Other Other (See Comments)    Pt does not take narcotics, is on Suboxone.   Marland Kitchen Penicillins Rash and Other (See Comments)    Has patient had a PCN reaction causing immediate rash, facial/tongue/throat swelling, SOB or lightheadedness with hypotension: Yes Has patient had a PCN reaction causing severe rash involving mucus membranes or skin necrosis: No Has patient had a PCN reaction that required hospitalization No Has patient had a PCN reaction occurring within the last 10 years: No If all of the above answers are "NO", then may proceed with Cephalosporin use.    VITALS:  Blood pressure (!) 158/95, pulse (!) 105, temperature 98.7 F (37.1 C), temperature source Oral, resp. rate 18, height  (1.575 m), weight 90.7 kg (200 lb), SpO2 97 %, not currently breastfeeding.  PHYSICAL EXAMINATION:   Physical Exam  GENERAL:  41 y.o.-year-old patient lying in the bed . EYES: Pupils equal, round, reactive to light and accommodation. No scleral icterus. Extraocular muscles intact.  HEENT: Head atraumatic, normocephalic. Oropharynx and nasopharynx clear.  NECK:  Supple, no jugular venous distention. No thyroid enlargement, no tenderness.  LUNGS: Normal breath sounds bilaterally, no wheezing, rales, rhonchi. No use of accessory muscles of respiration.  CARDIOVASCULAR: S1, S2 normal. No murmurs, rubs, or gallops.  ABDOMEN: Soft, nontender, nondistended. Bowel sounds present. No organomegaly or mass.  EXTREMITIES: No cyanosis, clubbing or edema b/l.     NEUROLOGIC: Cranial nerves II through XII are intact. No focal Motor or sensory deficits b/l.   PSYCHIATRIC: The patient is drowzy SKIN: No obvious rash, lesion, or ulcer.   LABORATORY PANEL:   CBC  Recent Labs Lab 12/20/16 0202  WBC 8.6  HGB 10.4*  HCT 31.3*  PLT 306   ------------------------------------------------------------------------------------------------------------------ Chemistries   Recent Labs Lab 12/19/16 0150 12/20/16 0202  NA 138 140  K 4.1 4.1  CL 100* 101  CO2 29 33*  GLUCOSE 152* 136*  BUN 7 6  CREATININE 0.82 0.67  CALCIUM 8.5* 8.3*  AST 24  --   ALT 22  --   ALKPHOS 74  --   BILITOT 0.2*  --    ------------------------------------------------------------------------------------------------------------------  Cardiac Enzymes  Recent Labs Lab 12/19/16 0150  TROPONINI <0.03   ------------------------------------------------------------------------------------------------------------------  RADIOLOGY:  US Venous Img Lower Bilateral  Result Date: 12/19/2016 CLINICAL DATA:  Acute pulmonary embolism. Bilateral lower extremity swelling. EXAM: BILATERAL LOWER EXTREMITY VENOUS DOPPLER ULTRASOUND TECHNIQUE: Gray-scale sonography with graded compression, as well as color Doppler and duplex ultrasound were performed to evaluate the lower extremity deep venous systems from the level of the common femoral vein and including the common femoral, femoral, profunda femoral, popliteal and calf veins including the posterior tibial, peroneal and gastrocnemius veins when visible. The superficial great saphenous vein was also interrogated. Spectral Doppler was utilized to evaluate flow at rest and with distal augmentation maneuvers in the common femoral, femoral and popliteal veins. COMPARISON:  None in PACs FINDINGS: RIGHT LOWER EXTREMITY Common Femoral Vein: No evidence of thrombus. Normal  compressibility, respiratory phasicity and response to augmentation.  Saphenofemoral Junction: No evidence of thrombus. Normal compressibility and flow on color Doppler imaging. Profunda Femoral Vein: No evidence of thrombus. Normal compressibility and flow on color Doppler imaging. Femoral Vein: No evidence of thrombus. Normal compressibility, respiratory phasicity and response to augmentation. Popliteal Vein: No evidence of thrombus. Normal compressibility, respiratory phasicity and response to augmentation. Calf Veins: No evidence of thrombus. Normal compressibility and flow on color Doppler imaging. Superficial Great Saphenous Vein: No evidence of thrombus. Normal compressibility and flow on color Doppler imaging. Venous Reflux:  None. Other Findings:  None. LEFT LOWER EXTREMITY Common Femoral Vein: No evidence of thrombus. Normal compressibility, respiratory phasicity and response to augmentation. Saphenofemoral Junction: No evidence of thrombus. Normal compressibility and flow on color Doppler imaging. Profunda Femoral Vein: No evidence of thrombus. Normal compressibility and flow on color Doppler imaging. Femoral Vein: No evidence of thrombus. Normal compressibility, respiratory phasicity and response to augmentation. Popliteal Vein: No evidence of thrombus. Normal compressibility, respiratory phasicity and response to augmentation. Calf Veins: No evidence of thrombus. Normal compressibility and flow on color Doppler imaging. Superficial Great Saphenous Vein: No evidence of thrombus. Normal compressibility and flow on color Doppler imaging. Venous Reflux:  None. Other Findings:  None. IMPRESSION: No evidence of DVT within either lower extremity. Electronically Signed   By: David  [place] M.D.   On: 12/19/2016 14:47     ASSESSMENT AND PLAN:   Megan Edwards  is a 41 y.o. female with a known history of alcohol abuse, polysubstance abuse, schizoaffective disorder with depression and anxiety, hypertension presents to the hospital from residential treatment program for alcohol  detox secondary to hypoxia.  # Alcohol withdrawal On CIWA protocol Was Delirious. Drowsy today. Discontinue Librium.  # Acute hypoxic respiratory failure- secondary to pulmonary embolism - segemental PE in right lung, no saddle embolus, no right heart strain noted - Lower ext venous dopplers - No DVT - hypercoagulable work up ordered andpending -  Weaned off O2 - change to Eliquis  # Acute encephalopathy- on several psych meds- at high doses Psych consult Check lithium level.  # Polysubstance abuse and alcohol abuse Alc level negative- last drink was 4  days ago per patient  # schizoaffective- psych consult to help with meds Prior h/o psychoses present as well  # HTN- hold labetelol for now as BP is soft  All the records are reviewed and case discussed with Care Management/Social Workerr. Management plans discussed with the patient, family and they are in agreement.  CODE STATUS: FULL CODE  DVT Prophylaxis: SCDs  TOTAL TIME TAKING CARE OF THIS PATIENT: 35 minutes.   POSSIBLE D/C IN 1-2 DAYS, DEPENDING ON CLINICAL CONDITION.  Milagros Loll R M.D on 12/21/2016 at 2:05 PM  Between 7am to 6pm - Pager - [phone]  After 6pm go to [email or web address] - password [email or web address] [facility]  [facility] [place] [facility]  Office  [phone]  CC: Primary care physician; Gilda Crease, MD  Note: This dictation was prepared with Dragon dictation along with smaller phrase technology. Any transcriptional errors that result from this process are unintentional.

## 2016-12-22 LAB — CARDIOLIPIN ANTIBODIES, IGG, IGM, IGA
Anticardiolipin IgG: <9 GPL U/mL (ref 0–14)
Anticardiolipin IgM: <9 MPL U/mL (ref 0–12)

## 2016-12-22 NOTE — Progress Notes (Signed)
Pt alert and oriented x3, no complaints of pain or discomfort.  Bed in low position, call bell within reach.  Bed alarms on and functioning.  Assessment done and charted.  Will continue to monitor and do hourly rounding throughout the shift 

## 2016-12-22 NOTE — Progress Notes (Signed)
Patient's cardiac monitoring has expired. Dr. Tobi Bastos notified and he has renewed  It. No acute distress noted. Patient is still drowsy. Patient was able to take her oral medications and drunk 2 small cans of coke last night without any difficulties. Will continue to monitor.

## 2016-12-22 NOTE — Progress Notes (Signed)
Secretary has called the taxi to take the pt home.  Pt told how to take the eliquis and pt was able to teach back.

## 2016-12-22 NOTE — Care Management Note (Signed)
Case Management Note  Patient Details  Name: NOLAN LASSER MRN: [id number] Date of Birth: [birth year]  Subjective/Objective:  Ms Witman was provided with an Eliquis coupon. Per the weekend CSW, Ms Peeks is being discharged home because [facility] will not accept Ms Conover back.                   Action/Plan:   Expected Discharge Date:  12/22/16               Expected Discharge Plan:     In-House Referral:     Discharge planning Services     Post Acute Care Choice:    Choice offered to:     DME Arranged:    DME Agency:     HH Arranged:    HH Agency:     Status of Service:     If discussed at [employer], dates discussed:    Additional Comments:  Conley Pawling A, RN 12/22/2016, 11:58 AM

## 2016-12-22 NOTE — Progress Notes (Signed)
Discharge: Pt d/c from room via wheelchair,  No questions from pt, reintegrated to the pt to call or go to the ED for chest discomfort. Pt dressed in street clothes and left with discharge papers and prescriptions in hand. IV d/ced, tele removed and no complaints of pain or discomfort.  Called the case manager for suggestions on getting the pt home because she don't have anyone that can come to get her.  voucher for taxi driver given.  Talked to the pt mother about her coming to her apt.  Pt mother Ms Vickey Huger said she could come to her home.  The address her mother gave was [address], [address], [place].  Talked with house supervisor regarding the situation and told her what was transpiring.  Pt wants to go to a rehab unit and instructed pt to see her case manager to assist her with finding a new facility because Dr Elpidio Anis was unsuccessful getting her back to the one she was once attending.

## 2016-12-22 NOTE — Care Management Note (Signed)
Case Management Note  Patient Details  Name: Megan Edwards MRN: [id number] Date of Birth: [birth year]  Subjective/Objective:      Megan Edwards and her nurse have made numerous calls to friends and relatives requesting that Megan Edwards needs transportation home today.No one was available to answer these calls. Megan Edwards has been discharged home from [facility] today.Rather than having Megan Edwards spend another night at [facility] a cab ticke was provided for transport to her home in [place].                Action/Plan:   Expected Discharge Date:  12/22/16               Expected Discharge Plan:     In-House Referral:     Discharge planning Services     Post Acute Care Choice:    Choice offered to:     DME Arranged:    DME Agency:     HH Arranged:    HH Agency:     Status of Service:     If discussed at [employer], dates discussed:    Additional Comments:  Jazmynn Pho A, RN 12/22/2016, 12:33 PM

## 2016-12-24 LAB — FACTOR 5 LEIDEN

## 2016-12-25 LAB — PROTHROMBIN GENE MUTATION

## 2016-12-25 NOTE — Discharge Summary (Signed)
[facility] - [place] at [facility]   PATIENT NAME: Megan Edwards    MR#:  [id number]  DATE OF BIRTH:  11-09-1975  DATE OF ADMISSION:  12/19/2016 ADMITTING PHYSICIAN: Enid Baas, MD  DATE OF DISCHARGE: 12/22/2016  1:41 PM  PRIMARY CARE PHYSICIAN: Pavelock, Duke Salvia, MD   ADMISSION DIAGNOSIS:  Swelling [R60.9] SOB (shortness of breath) [R06.02] Acute respiratory failure with hypoxia and hypercapnia (HCC) [J96.01, J96.02] Altered mental status, unspecified altered mental status type [R41.82] Other acute pulmonary embolism without acute cor pulmonale (HCC) [I26.99]  DISCHARGE DIAGNOSIS:  Principal Problem:   Bipolar I disorder, most recent episode manic, severe with psychotic features (HCC) Active Problems:   Opioid use disorder, mild, in controlled environment (HCC)   Alcohol withdrawal (HCC)   Pulmonary embolism (HCC)   SECONDARY DIAGNOSIS:   Past Medical History:  Diagnosis Date  . Abnormal Pap smear   . Alcohol abuse   . Bipolar 1 disorder (HCC)   . Depression   . Genital herpes 2000  . Hypertension   . Infection    UTI  . Ovarian cyst   . Schizoaffective disorder (HCC) 05/05/2016  . SVT (supraventricular tachycardia) (HCC)      ADMITTING HISTORY  HISTORY OF PRESENT ILLNESS:  Megan Edwards  is a 41 y.o. female with a known history of alcohol abuse, polysubstance abuse, schizoaffective disorder with depression and anxiety, hypertension presents to the hospital from residential treatment program for alcohol detox secondary to hypoxia. Patient is pretty sedated at this time, unable to provide most of the history. Most of the stripper up to and from records and also ER physician. Patient was recently admitted to an outside hospital for alcohol withdrawal. She was treated and was referred to a residential treatment services program. She was at [facility] yesterday. She was noted to be hypoxic with saturations in the 60s on room air. She started  noticing shortness of breath for almost 2 months now. It was worse when she was lying flat. No cough or fevers or chills. No recent travel. No recent illness. Chest x-ray did not reveal any acute abnormality. Patient remains hypoxic requiring 2-3L o2 here. CT chest revealed PE.   HOSPITAL COURSE:   Darlynn Dellingeris a 41 y.o. femalewith a known history of alcohol abuse, polysubstance abuse, schizoaffective disorder with depression and anxiety, hypertension presents to the hospital from residential treatment program for alcohol detox secondary to hypoxia.  # Alcohol withdrawal On CIWA protocol   Was treated with as needed Ativan and scheduled Librium.  Librium has been stopped.  No further withdrawals at time of discharge.  # Acute hypoxic respiratory failure- secondary to pulmonary embolism - segemental PE in right lung, no saddle embolus, no right heart strain noted - Lower ext venous dopplers - No DVT - hypercoagulable work up ordered and pending -  Weaned off O2 -    Discharge home on Eliquis  [facility] of [facility] and hypercoagulable workup.  # Acute encephalopathy- on several psych meds- at high doses  resolved.  This was due to alcohol withdrawal.  # Polysubstance abuse and alcohol abuse   Counseled multiple times during the hospital stay  # schizoaffective- psych consulted  To help with medications.  Home medications continued.  # HTN-    Stable    Patient wanted to go to [facility] for her alcohol rehab.  [facility] made to [facility] but [facility].  Patient is being  discharged home with local alcohol resources information provided.  CONSULTS OBTAINED:  Treatment Team:  Clapacs, Jackquline Denmark, MD  DRUG ALLERGIES:   Allergies  Allergen Reactions  . Other Other (See Comments)    Pt does not take narcotics, is on Suboxone.   Marland Kitchen Penicillins Rash and Other (See Comments)    Has patient had a PCN reaction causing immediate  rash, facial/tongue/throat swelling, SOB or lightheadedness with hypotension: Yes Has patient had a PCN reaction causing severe rash involving mucus membranes or skin necrosis: No Has patient had a PCN reaction that required hospitalization No Has patient had a PCN reaction occurring within the last 10 years: No If all of the above answers are "NO", then may proceed with Cephalosporin use.    DISCHARGE MEDICATIONS:   Discharge Medication List as of 12/22/2016 12:53 PM    START taking these medications   Details  ELIQUIS STARTER PACK (ELIQUIS STARTER PACK) 5 MG TABS Take as directed on package: start with two-5mg  tablets twice daily for 7 days. On day 8, switch to one-5mg  tablet twice daily., Print      CONTINUE these medications which have NOT CHANGED   Details  atomoxetine (STRATTERA) 80 MG capsule Take 80 mg by mouth daily., Starting Wed 11/06/2016, Historical Med    benztropine (COGENTIN) 1 MG tablet Take 1 tablet (1 mg total) by mouth at bedtime. For prevention drug related tremors., Starting Mon 05/13/2016, Normal    buprenorphine-naloxone (SUBOXONE) 8-2 mg SUBL SL tablet Place 2 tablets under the tongue daily. , Historical Med    gabapentin (NEURONTIN) 100 MG capsule Take 200 mg by mouth at bedtime., Starting Tue 12/10/2016, Historical Med    labetalol (NORMODYNE) 100 MG tablet Take 1 tablet (100 mg) in the morning & 2 tablets (200 mg) in the evening: For high blood pressure, Print    lithium carbonate 300 MG capsule Take 3 capsules (900 mg total) by mouth at bedtime. For mood stabilization, Starting Mon 05/13/2016, Normal    montelukast (SINGULAIR) 10 MG tablet Take 10 mg by mouth at bedtime., Historical Med    paliperidone (INVEGA) 9 MG 24 hr tablet Take 1 tablet (9 mg total) by mouth at bedtime. For mood control, Starting Mon 05/13/2016, Normal    PARoxetine (PAXIL) 20 MG tablet Take 20 mg by mouth daily., Starting Tue 12/10/2016, Historical Med    prazosin (MINIPRESS) 2 MG  capsule Take 1 capsule (2 mg total) by mouth at bedtime. For nightmares, Starting Mon 05/13/2016, Normal    QUEtiapine (SEROQUEL) 200 MG tablet Take 200 mg by mouth as needed (sleep). , Starting Wed 07/03/2016, Historical Med      STOP taking these medications     chlordiazePOXIDE (LIBRIUM) 25 MG capsule         Today   VITAL SIGNS:  Blood pressure 122/74, pulse (!) 114, temperature 98.2 F (36.8 C), temperature source Oral, resp. rate 19, height  (1.575 m), weight 90.7 kg (200 lb), SpO2 97 %, not currently breastfeeding.  I/O:  No intake or output data in the 24 hours ending 12/25/16 1056  PHYSICAL EXAMINATION:  Physical Exam  GENERAL:  41 y.o.-year-old patient lying in the bed with no acute distress.  LUNGS: Normal breath sounds bilaterally, no wheezing, rales,rhonchi or crepitation. No use of accessory muscles of respiration.  CARDIOVASCULAR: S1, S2 normal. No murmurs, rubs, or gallops.  ABDOMEN: Soft, non-tender, non-distended. Bowel sounds present. No organomegaly or mass.  NEUROLOGIC: Moves all 4 extremities. PSYCHIATRIC: The patient is  alert and oriented x 3.  SKIN: No obvious rash, lesion, or ulcer.   DATA REVIEW:   CBC  Recent Labs Lab 12/20/16 0202  WBC 8.6  HGB 10.4*  HCT 31.3*  PLT 306    Chemistries   Recent Labs Lab 12/19/16 0150 12/20/16 0202  NA 138 140  K 4.1 4.1  CL 100* 101  CO2 29 33*  GLUCOSE 152* 136*  BUN 7 6  CREATININE 0.82 0.67  CALCIUM 8.5* 8.3*  AST 24  --   ALT 22  --   ALKPHOS 74  --   BILITOT 0.2*  --     Cardiac Enzymes  Recent Labs Lab 12/19/16 0150  TROPONINI <0.03    Microbiology Results  Results for orders placed or performed during the hospital encounter of 12/19/16  MRSA PCR Screening     Status: None   Collection Time: 12/19/16 12:40 PM  Result Value Ref Range Status   MRSA by PCR NEGATIVE NEGATIVE Final    Comment:        The GeneXpert MRSA Assay (FDA approved for NASAL specimens only), is one  component of a comprehensive MRSA colonization surveillance program. It is not intended to diagnose MRSA infection nor to guide or monitor treatment for MRSA infections.     RADIOLOGY:  No results found.  Follow up with PCP in 1 week.  Management plans discussed with the patient, family and they are in agreement.  CODE STATUS:  Code Status History    Date Active Date Inactive Code Status Order ID Comments User Context   12/19/2016 12:12 PM 12/22/2016  4:43 PM Full Code [id number]  Enid Baas, MD Inpatient   12/12/2016 11:56 PM 12/13/2016 10:09 AM Full Code [id number]  Lavera Guise, MD ED   12/01/2016  9:24 PM 12/05/2016  4:48 PM Full Code [id number]  Eston Esters, MD ED   05/05/2016  3:58 PM 05/13/2016  4:01 PM Full Code [id number]  Adonis Brook, NP Inpatient   05/03/2016 12:08 AM 05/05/2016  3:05 PM Full Code [id number]  Earley Favor, NP ED   01/20/2016 10:20 AM 01/23/2016  3:37 PM Full Code [id number]  Beverly Sessions, MD Inpatient   01/18/2016  6:22 AM 01/20/2016 12:58 AM Full Code [id number]  Zadie Rhine, MD ED   10/21/2015 12:25 AM 10/22/2015  5:51 PM Full Code [id number]  Hillary Bow, DO ED   08/12/2015  9:07 PM 08/15/2015  3:02 PM Full Code [id number]  de Dios, Hilton Cork, MD ED   06/13/2015 12:15 AM 06/15/2015  2:54 PM Full Code [id number]  Brock Bad, MD Inpatient   06/11/2015  5:16 AM 06/13/2015 12:15 AM Full Code [id number]  Veva Holes, RN Inpatient   08/09/2011  7:16 PM 08/11/2011 12:20 AM Full Code [id number]  Suzi Roots, MD ED      TOTAL TIME TAKING CARE OF THIS PATIENT ON DAY OF DISCHARGE: more than 30 minutes.   Milagros Loll R M.D on 12/25/2016 at 10:56 AM  Between 7am to 6pm - Pager - [phone]  After 6pm go to [email or web address] - password [email or web address] [facility]  [facility] [place] [facility]  Office  [phone]  CC: Primary care physician; Gilda Crease, MD  Note: This dictation was prepared with Dragon dictation along with smaller phrase  technology. Any transcriptional errors that result from this process are unintentional.

## 2017-01-13 ENCOUNTER — Emergency Department (HOSPITAL_COMMUNITY)
Admission: EM | Admit: 2017-01-13 | Discharge: 2017-01-14 | Disposition: A | Discharge status: Home / Self Care | Payer: Medicaid Other | Attending: Emergency Medicine | Admitting: Emergency Medicine

## 2017-01-13 ENCOUNTER — Encounter (HOSPITAL_COMMUNITY): Payer: Self-pay

## 2017-01-13 ENCOUNTER — Emergency Department (HOSPITAL_COMMUNITY): Payer: Medicaid Other

## 2017-01-13 DIAGNOSIS — F410 Panic disorder [episodic paroxysmal anxiety] without agoraphobia: Secondary | ICD-10-CM | POA: Insufficient documentation

## 2017-01-13 DIAGNOSIS — I10 Essential (primary) hypertension: Secondary | ICD-10-CM | POA: Insufficient documentation

## 2017-01-13 DIAGNOSIS — F3010 Manic episode without psychotic symptoms, unspecified: Secondary | ICD-10-CM

## 2017-01-13 DIAGNOSIS — Z79899 Other long term (current) drug therapy: Secondary | ICD-10-CM | POA: Insufficient documentation

## 2017-01-13 DIAGNOSIS — F419 Anxiety disorder, unspecified: Secondary | ICD-10-CM | POA: Diagnosis present

## 2017-01-13 DIAGNOSIS — F909 Attention-deficit hyperactivity disorder, unspecified type: Secondary | ICD-10-CM | POA: Diagnosis not present

## 2017-01-13 DIAGNOSIS — F309 Manic episode, unspecified: Secondary | ICD-10-CM | POA: Diagnosis not present

## 2017-01-13 DIAGNOSIS — F32A Depression, unspecified: Secondary | ICD-10-CM

## 2017-01-13 DIAGNOSIS — F329 Major depressive disorder, single episode, unspecified: Secondary | ICD-10-CM

## 2017-01-13 DIAGNOSIS — Z818 Family history of other mental and behavioral disorders: Secondary | ICD-10-CM | POA: Diagnosis not present

## 2017-01-13 DIAGNOSIS — Z811 Family history of alcohol abuse and dependence: Secondary | ICD-10-CM | POA: Diagnosis not present

## 2017-01-13 HISTORY — DX: Major depressive disorder, single episode, unspecified: F32.9

## 2017-01-13 HISTORY — DX: Anxiety disorder, unspecified: F41.9

## 2017-01-13 LAB — CBC WITH DIFFERENTIAL/PLATELET
BASOS ABS: 0 10*3/uL (ref 0.0–0.1)
BASOS PCT: 0 %
EOS ABS: 0.2 10*3/uL (ref 0.0–0.7)
EOS PCT: 2 %
HCT: 39.7 % (ref 36.0–46.0)
Hemoglobin: 13.4 g/dL (ref 12.0–15.0)
LYMPHS PCT: 21 %
Lymphs Abs: 1.7 10*3/uL (ref 0.7–4.0)
MCH: 28.4 pg (ref 26.0–34.0)
MCHC: 33.8 g/dL (ref 30.0–36.0)
MCV: 84.1 fL (ref 78.0–100.0)
Monocytes Absolute: 0.3 10*3/uL (ref 0.1–1.0)
Monocytes Relative: 3 %
Neutro Abs: 6.2 10*3/uL (ref 1.7–7.7)
Neutrophils Relative %: 74 %
Platelets: 273 10*3/uL (ref 150–400)
RBC: 4.72 MIL/uL (ref 3.87–5.11)
RDW: 14.4 % (ref 11.5–15.5)
WBC: 8.5 10*3/uL (ref 4.0–10.5)

## 2017-01-13 LAB — COMPREHENSIVE METABOLIC PANEL
ALBUMIN: 4.5 g/dL (ref 3.5–5.0)
ALT: 27 U/L (ref 14–54)
AST: 26 U/L (ref 15–41)
Alkaline Phosphatase: 102 U/L (ref 38–126)
Anion gap: 13 (ref 5–15)
BUN: 10 mg/dL (ref 6–20)
CHLORIDE: 99 mmol/L — AB (ref 101–111)
CO2: 26 mmol/L (ref 22–32)
CREATININE: 0.83 mg/dL (ref 0.44–1.00)
Calcium: 9.6 mg/dL (ref 8.9–10.3)
GFR calc Af Amer: >60 mL/min (ref >60)
GFR calc non Af Amer: >60 mL/min (ref >60)
GLUCOSE: 106 mg/dL — AB (ref 65–99)
POTASSIUM: 4.2 mmol/L (ref 3.5–5.1)
SODIUM: 138 mmol/L (ref 135–145)
Total Bilirubin: 0.5 mg/dL (ref 0.3–1.2)
Total Protein: 7.9 g/dL (ref 6.5–8.1)

## 2017-01-13 LAB — LITHIUM LEVEL: Lithium Lvl: 0.16 mmol/L — ABNORMAL LOW (ref 0.60–1.20)

## 2017-01-13 LAB — RAPID URINE DRUG SCREEN, HOSP PERFORMED
AMPHETAMINES: NOT DETECTED
Barbiturates: NOT DETECTED
Benzodiazepines: NOT DETECTED
Cocaine: NOT DETECTED
OPIATES: NOT DETECTED
Tetrahydrocannabinol: NOT DETECTED

## 2017-01-13 LAB — POC URINE PREG, ED: PREG TEST UR: NEGATIVE

## 2017-01-13 LAB — ETHANOL: Alcohol, Ethyl (B): <10 mg/dL (ref <10)

## 2017-01-13 MED ORDER — CLONIDINE HCL 0.1 MG PO TABS
0.1000 mg | ORAL_TABLET | Freq: Four times a day (QID) | ORAL | Status: DC
  Administered 2017-01-13 – 2017-01-14 (×3): 0.1 mg via ORAL
  Filled 2017-01-13 (×3): qty 1

## 2017-01-13 MED ORDER — GABAPENTIN 100 MG PO CAPS
200.0000 mg | ORAL_CAPSULE | Freq: Every day | ORAL | Status: DC
  Administered 2017-01-13: 200 mg via ORAL
  Filled 2017-01-13: qty 2

## 2017-01-13 MED ORDER — LABETALOL HCL 100 MG PO TABS
100.0000 mg | ORAL_TABLET | Freq: Once | ORAL | Status: AC
  Administered 2017-01-13: 100 mg via ORAL
  Filled 2017-01-13: qty 1

## 2017-01-13 MED ORDER — HYDROXYZINE HCL 25 MG PO TABS
25.0000 mg | ORAL_TABLET | Freq: Four times a day (QID) | ORAL | Status: DC | PRN
  Administered 2017-01-13: 25 mg via ORAL
  Filled 2017-01-13: qty 1

## 2017-01-13 MED ORDER — LITHIUM CARBONATE 300 MG PO CAPS
900.0000 mg | ORAL_CAPSULE | Freq: Every day | ORAL | Status: DC
  Administered 2017-01-13: 900 mg via ORAL
  Filled 2017-01-13: qty 3

## 2017-01-13 MED ORDER — PRAZOSIN HCL 1 MG PO CAPS
2.0000 mg | ORAL_CAPSULE | Freq: Every day | ORAL | Status: DC
  Administered 2017-01-13: 2 mg via ORAL
  Filled 2017-01-13: qty 2

## 2017-01-13 MED ORDER — MONTELUKAST SODIUM 10 MG PO TABS
10.0000 mg | ORAL_TABLET | Freq: Every day | ORAL | Status: DC
  Administered 2017-01-13: 10 mg via ORAL
  Filled 2017-01-13: qty 1

## 2017-01-13 MED ORDER — ONDANSETRON 4 MG PO TBDP: 4.0000 mg | ORAL_TABLET | Freq: Four times a day (QID) | ORAL | Status: DC | PRN

## 2017-01-13 MED ORDER — APIXABAN 5 MG PO TABS: 5.0000 mg | ORAL_TABLET | Freq: Two times a day (BID) | ORAL | Status: DC

## 2017-01-13 MED ORDER — CLONIDINE HCL 0.1 MG PO TABS
0.1000 mg | ORAL_TABLET | Freq: Every day | ORAL | Status: DC
Start: 2017-01-18 — End: 2017-01-14

## 2017-01-13 MED ORDER — LABETALOL HCL 100 MG PO TABS
100.0000 mg | ORAL_TABLET | Freq: Every day | ORAL | Status: DC
  Administered 2017-01-14: 100 mg via ORAL
  Filled 2017-01-13: qty 1

## 2017-01-13 MED ORDER — ACETAMINOPHEN 325 MG PO TABS: 650.0000 mg | ORAL_TABLET | ORAL | Status: DC | PRN

## 2017-01-13 MED ORDER — APIXABAN 5 MG PO TABS
10.0000 mg | ORAL_TABLET | Freq: Two times a day (BID) | ORAL | Status: DC
  Filled 2017-01-13: qty 2

## 2017-01-13 MED ORDER — GABAPENTIN 300 MG PO CAPS
300.0000 mg | ORAL_CAPSULE | Freq: Three times a day (TID) | ORAL | Status: DC
  Administered 2017-01-13 – 2017-01-14 (×3): 300 mg via ORAL
  Filled 2017-01-13 (×3): qty 1

## 2017-01-13 MED ORDER — METHOCARBAMOL 500 MG PO TABS
500.0000 mg | ORAL_TABLET | Freq: Three times a day (TID) | ORAL | Status: DC | PRN
Start: 2017-01-13 — End: 2017-01-14

## 2017-01-13 MED ORDER — LORAZEPAM 2 MG/ML IJ SOLN
1.0000 mg | Freq: Once | INTRAMUSCULAR | Status: AC
  Administered 2017-01-13: 1 mg via INTRAVENOUS
  Filled 2017-01-13: qty 1

## 2017-01-13 MED ORDER — DICYCLOMINE HCL 20 MG PO TABS: 20.0000 mg | ORAL_TABLET | Freq: Four times a day (QID) | ORAL | Status: DC | PRN

## 2017-01-13 MED ORDER — DIPHENHYDRAMINE HCL 50 MG/ML IJ SOLN
25.0000 mg | Freq: Once | INTRAMUSCULAR | Status: AC
  Administered 2017-01-13: 25 mg via INTRAVENOUS
  Filled 2017-01-13: qty 1

## 2017-01-13 MED ORDER — CLONIDINE HCL 0.1 MG PO TABS: 0.1000 mg | ORAL_TABLET | ORAL | Status: DC

## 2017-01-13 MED ORDER — PALIPERIDONE ER 6 MG PO TB24
9.0000 mg | ORAL_TABLET | Freq: Every day | ORAL | Status: DC
  Administered 2017-01-13: 9 mg via ORAL
  Filled 2017-01-13: qty 1

## 2017-01-13 MED ORDER — NAPROXEN 500 MG PO TABS
500.0000 mg | ORAL_TABLET | Freq: Two times a day (BID) | ORAL | Status: DC | PRN
  Administered 2017-01-13: 500 mg via ORAL
  Filled 2017-01-13: qty 1

## 2017-01-13 MED ORDER — LOPERAMIDE HCL 2 MG PO CAPS: 2.0000 mg | ORAL_CAPSULE | ORAL | Status: DC | PRN

## 2017-01-13 MED ORDER — QUETIAPINE FUMARATE 100 MG PO TABS
200.0000 mg | ORAL_TABLET | Freq: Every evening | ORAL | Status: DC | PRN
  Administered 2017-01-13: 200 mg via ORAL
  Filled 2017-01-13: qty 2

## 2017-01-13 MED ORDER — LABETALOL HCL 200 MG PO TABS
200.0000 mg | ORAL_TABLET | Freq: Every day | ORAL | Status: DC
  Administered 2017-01-13: 200 mg via ORAL
  Filled 2017-01-13 (×2): qty 1

## 2017-01-13 MED ORDER — APIXABAN 5 MG PO TABS
5.0000 mg | ORAL_TABLET | Freq: Two times a day (BID) | ORAL | Status: DC
  Administered 2017-01-13 – 2017-01-14 (×2): 5 mg via ORAL
  Filled 2017-01-13 (×2): qty 1

## 2017-01-13 NOTE — ED Provider Notes (Addendum)
[facility]-EMERGENCY DEPT Provider Note   CSN: [id number] Arrival date & time: 01/13/17  1124     History   Chief Complaint No chief complaint on file.   HPI Megan Edwards is a 41 y.o. female.  Patient is a 41 year old female with a history of bipolar disease, alcohol abuse, alcohol withdrawal, polysubstance abuse, recent PE on Eliquis last month who is  presenting today with complaints of concern for allergic reaction.  Patient states that she thinks it is from her Seroquel but when she takes that she feels like her throat is closing up and that her tongue is swelling and she cannot breathe.  However she has been on this medication for a prolonged period of time.  She states that symptoms seem to be worse after she started taking the blood thinner.  She is feeling anxious all the time and is not able to catch her breath.  She denies any rashes or itching.  Before taking the blood thinner she states this was happening with other medications.  She did not take the Seroquel today but still had symptoms after taking her Eliquis.  She denies any symptoms of wanting to hurt herself but states when she has been manic in the past it has felt similar to this.  She has not had any alcohol since her hospitalization last month and denies any symptoms of withdrawal.   The history is provided by the patient.    Past Medical History:  Diagnosis Date  . Abnormal Pap smear   . Alcohol abuse   . Bipolar 1 disorder (HCC)   . Depression   . Genital herpes 2000  . Hypertension   . Infection    UTI  . Ovarian cyst   . Schizoaffective disorder (HCC) 05/05/2016  . SVT (supraventricular tachycardia) [facility])     Patient Active Problem List   Diagnosis Date Noted  . Pulmonary embolism (HCC) 12/19/2016  . Alcohol withdrawal (HCC) 12/01/2016  . ADD (attention deficit disorder) 05/10/2016  . Head ache 05/08/2016  . Opioid use disorder, mild, in controlled environment (HCC)  05/06/2016  . Essential hypertension 05/06/2016  . Alcohol use disorder, moderate, in early remission, dependence (HCC) 05/06/2016  . Bipolar I disorder, most recent episode manic, severe with psychotic features (HCC) 01/20/2016  . Chronic pain syndrome   . Morbid obesity due to excess calories [facility])     Past Surgical History:  Procedure Laterality Date  . addenoidectomy    . BREAST ENHANCEMENT SURGERY    . BREAST SURGERY    . KNEE SURGERY    . KNEE SURGERY Left   . TONSILLECTOMY    . WISDOM TOOTH EXTRACTION      OB History    Gravida Para Term Preterm AB Living   5 2 2  0 3 2   SAB TAB Ectopic Multiple Live Births   1 2 0 0 2       Home Medications    Prior to Admission medications   Medication Sig Start Date End Date Taking? Authorizing Provider  atomoxetine (STRATTERA) 80 MG capsule Take 80 mg by mouth daily. 11/06/16  Yes [provider]  benztropine (COGENTIN) 1 MG tablet Take 1 tablet (1 mg total) by mouth at bedtime. For prevention drug related tremors. 05/13/16  Yes Armandina Stammer I, NP  buprenorphine-naloxone (SUBOXONE) 8-2 mg SUBL SL tablet Place 2 tablets under the tongue daily.    Yes [provider]  Everlene Balls STARTER PACK (ELIQUIS STARTER PACK)  5 MG TABS Take as directed on package: start with two-5mg  tablets twice daily for 7 days. On day 8, switch to one-5mg  tablet twice daily. 12/20/16  Yes Sudini, Wardell Heath, MD  gabapentin (NEURONTIN) 100 MG capsule Take 200 mg by mouth at bedtime. 12/10/16  Yes [provider]  labetalol (NORMODYNE) 100 MG tablet Take 1 tablet (100 mg) in the morning & 2 tablets (200 mg) in the evening: For high blood pressure 05/13/16  Yes Nwoko, Nicole Kindred I, NP  lithium carbonate 300 MG capsule Take 3 capsules (900 mg total) by mouth at bedtime. For mood stabilization 05/13/16  Yes Nwoko, Nicole Kindred I, NP  montelukast (SINGULAIR) 10 MG tablet Take 10 mg by mouth at bedtime.   Yes [provider]  paliperidone (INVEGA) 9 MG 24  hr tablet Take 1 tablet (9 mg total) by mouth at bedtime. For mood control 05/13/16  Yes Nwoko, Nicole Kindred I, NP  PARoxetine (PAXIL) 20 MG tablet Take 20 mg by mouth daily. 12/10/16  Yes [provider]  prazosin (MINIPRESS) 2 MG capsule Take 1 capsule (2 mg total) by mouth at bedtime. For nightmares 05/13/16  Yes Armandina Stammer I, NP  QUEtiapine (SEROQUEL) 200 MG tablet Take 200 mg by mouth at bedtime as needed (sleep).  07/03/16  Yes [provider]    Family History Family History  Problem Relation Age of Onset  . Alcoholism Unknown   . Leukemia Unknown   . Cervical cancer Unknown   . Bone cancer Unknown   . Cirrhosis Unknown   . Diabetes Mellitus II Unknown   . Depression Unknown   . Hypertension Unknown   . Thyroid cancer Unknown   . Diabetes Mellitus II Mother   . Hypertension Mother   . Alcoholism Father   . Mental illness Paternal Grandmother     Social History Social History  Substance Use Topics  . Smoking status: Never Smoker  . Smokeless tobacco: Never Used  . Alcohol use 0.0 oz/week     Comment: states has not drank alcohol since 12/19/16     Allergies   Other and Penicillins   Review of Systems Review of Systems  Psychiatric/Behavioral: Positive for agitation. Negative for hallucinations and suicidal ideas.  All other systems reviewed and are negative.    Physical Exam Updated Vital Signs BP (!) 180/106   Pulse 99   Temp 98.9 F (37.2 C) (Oral)   Resp 12   Ht 5\' 7"  (1.702 m)   Wt 90.7 kg (200 lb)   SpO2 94%   BMI 31.32 kg/m   Physical Exam  Constitutional: She is oriented to person, place, and time. She appears well-developed and well-nourished. She appears distressed.  Appears uncomfortable and anxious  HENT:  Head: Normocephalic and atraumatic.  Mouth/Throat: Oropharynx is clear and moist.  No tongue or uvula edema  Eyes: Pupils are equal, round, and reactive to light. Conjunctivae and EOM are normal.  Neck: Normal range of  motion. Neck supple.  No stridor  Cardiovascular: Normal rate, regular rhythm and intact distal pulses.   No murmur heard. Pulmonary/Chest: Effort normal and breath sounds normal. No respiratory distress. She has no wheezes. She has no rales.  Abdominal: Soft. She exhibits no distension. There is no tenderness. There is no rebound and no guarding.  Musculoskeletal: Normal range of motion. She exhibits no edema or tenderness.  No leg pain or swelling  Neurological: She is alert and oriented to person, place, and time.  Skin: Skin is warm and  dry. No rash noted. No erythema.  Psychiatric: Her mood appears anxious. Her speech is rapid and/or pressured. Her speech is not slurred. She is hyperactive. Thought content is not paranoid. She expresses no homicidal and no suicidal ideation.  Nursing note and vitals reviewed.    ED Treatments / Results  Labs (all labs ordered are listed, but only abnormal results are displayed) Labs Reviewed  COMPREHENSIVE METABOLIC PANEL - Abnormal; Notable for the following:       Result Value   Chloride 99 (*)    Glucose, Bld 106 (*)    All other components within normal limits  LITHIUM LEVEL - Abnormal; Notable for the following:    Lithium Lvl 0.16 (*)    All other components within normal limits  CBC WITH DIFFERENTIAL/PLATELET  ETHANOL  RAPID URINE DRUG SCREEN, HOSP PERFORMED  POC URINE PREG, ED    EKG  EKG Interpretation  Date/Time:  Monday January 13 2017 12:50:16 EDT Ventricular Rate:  85 PR Interval:    QRS Duration: 106 QT Interval:  363 QTC Calculation: 432 R Axis:   39 Text Interpretation:  Age not entered, assumed to be  41 years old for purpose of ECG interpretation Sinus rhythm Low voltage, precordial leads Probable anteroseptal infarct, old No significant change since last tracing Confirmed by Gwyneth SproutPlunkett, Bentzion Dauria ([id number]) on 01/13/2017 1:28:19 PM       Radiology Dg Chest 2 View  Result Date: 01/13/2017 CLINICAL DATA:  Shortness  of breath. EXAM: CHEST  2 VIEW COMPARISON:  Chest x-ray dated December 12, 2016. FINDINGS: The cardiomediastinal silhouette is normal in size. Normal pulmonary vascularity. Low lung volumes with bronchovascular crowding. No focal consolidation, pleural effusion, or pneumothorax. No acute osseous abnormality. IMPRESSION: Low lung volumes.  No definite active cardiopulmonary disease. Electronically Signed   By: Obie DredgeWilliam T Derry M.D.   On: 01/13/2017 13:57    Procedures Procedures (including critical care time)  Medications Ordered in ED Medications  diphenhydrAMINE (BENADRYL) injection 25 mg (25 mg Intravenous Given 01/13/17 1223)  LORazepam (ATIVAN) injection 1 mg (1 mg Intravenous Given 01/13/17 1442)     Initial Impression / Assessment and Plan / ED Course  I have reviewed the triage vital signs and the nursing notes.  Pertinent labs & imaging results that were available during my care of the patient were reviewed by me and considered in my medical decision making (see chart for details).     Patient presenting today initially complaining of an allergic reaction however feel like it is more likely exacerbation of her mental health disorder.  Patient states it feels like her tongue is swelling in her throat is closing up after taking medications however she has no stridor, wheezing or oral swelling.  She states it does not matter the medication the medicine she takes intermittently different ones cause the same symptoms.  She is anxious appearing on exam and hyperventilating.  Patient is satting 100% and 1, heart rate is less than 100.  She has been persistently hypertensive here but the only medication she is taken today is her Eliquis.  Low suspicion for withdrawal as patient states the last time she had any alcohol was about a month ago prior to her PE diagnosis.  She has had issues with episodes of mania with her bipolar disease and states this feels similar and she is just unable to calm  down.  Patient initially given Benadryl which helped her to calm down but she still is feeling anxious.  She was given  Ativan.  We will have TTS evaluate.  Patient's labs without significant findings.  Lithium level is low.  EKG and chest x-ray without acute findings.  4:13 PM Pt has been evaluated by TTS and they recommended inpt.  Pt placed in psych hold.  Final Clinical Impressions(s) / ED Diagnoses   Final diagnoses:  Anxiety attack  Manic behavior (HCC)    New Prescriptions New Prescriptions   No medications on file     Gwyneth Sprout, MD 01/13/17 1544    Gwyneth Sprout, MD 01/13/17 [phone]

## 2017-01-13 NOTE — BH Assessment (Addendum)
Assessment Note  Megan Edwards is an 41 y.o. female that presents this date with excessive anxiety and agitation due to what she feels is associated with recent medication changes.  Patient states she started having symptoms on Friday 01/10/17 reporting: feeling dizzy, loosing her balance and seeing double. Patient stated she "dealt with it" all weekend until today when her symptoms worsened reporting tightness in her chest, vertigo and seeing double. Patient is observed to be in distress during assessment with patient reporting she "can't stop scratching" and feels "bugs are under her skin." Patient states she has been receiving services from Pavlock MD for the last six months who assists with medication management. Patient reports that provider made some recent medication changes within the last week but cannot recall what those changes where. Patient reports current compliance and states that provider is also assisting her with SA issues/medications.Patient has a history of alcohol and opioid use but denies current use. Patient states she took "a couple pain killers" two weeks ago but cannot recall the details of that incident or medications taken. Patient renders conflicting history in reference to SA use and medication management. Patient denies any previous attempts/gestures at self harm. Patient denies current S/I, H/I or AVH. Patient is time/place oriented and has rapid pressured speech. Per notes, patient has a history of Bipolar disorder and mania. Per chart review patient has had multiple admissions in 2018, 2017 at [facility], [facility] and [facility]. Patient is currently receiving OP services from [facility]. Per admission note, patient is  presenting today with complaints of concern for allergic reaction. Patient states that she thinks it is from her Seroquel but when she takes that she feels like her throat is closing up and that her tongue is swelling and she cannot breathe. However  she has been on this medication for a prolonged period of time. She is feeling anxious all the time and is not able to catch her breath. She denies any rashes or itching. Before taking the blood thinner she states this was happening with other medications. She did not take the Seroquel today but still had symptoms after taking her Eliquis. She denies any symptoms of wanting to hurt herself but states when she has been manic in the past it has felt similar to this. She has not had any alcohol since her hospitalization last month and denies any symptoms of withdrawal. Case was staffed with [name] DNP who recommended patient be observed and monitored for safety. Medications to be evaluated and seen by psychiatry in the a.m.  Diagnosis: Bipolar 1, most recent episode manic, severe  Past Medical History:  Past Medical History:  Diagnosis Date  . Abnormal Pap smear   . Alcohol abuse   . Bipolar 1 disorder (HCC)   . Depression   . Genital herpes 2000  . Hypertension   . Infection    UTI  . Ovarian cyst   . Schizoaffective disorder (HCC) 05/05/2016  . SVT (supraventricular tachycardia) (HCC)     Past Surgical History:  Procedure Laterality Date  . addenoidectomy    . BREAST ENHANCEMENT SURGERY    . BREAST SURGERY    . KNEE SURGERY    . KNEE SURGERY Left   . TONSILLECTOMY    . WISDOM TOOTH EXTRACTION      Family History:  Family History  Problem Relation Age of Onset  . Alcoholism Unknown   . Leukemia Unknown   . Cervical cancer Unknown   . Bone  cancer Unknown   . Cirrhosis Unknown   . Diabetes Mellitus II Unknown   . Depression Unknown   . Hypertension Unknown   . Thyroid cancer Unknown   . Diabetes Mellitus II Mother   . Hypertension Mother   . Alcoholism Father   . Mental illness Paternal Grandmother     Social History:  reports that she has never smoked. She has never used smokeless tobacco. She reports that she drinks alcohol. She reports that she does not use  drugs.  Additional Social History:  Alcohol / Drug Use Pain Medications: See MAR Prescriptions: See MAR Over the Counter: See MAR History of alcohol / drug use?: Yes Longest period of sobriety (when/how long): Unknown Negative Consequences of Use:  (Currently denies) Withdrawal Symptoms:  (Denies) Substance #1 Name of Substance 1: Alcohol 1 - Age of First Use: 21 1 - Amount (size/oz): Amounts vary 1 - Frequency: Current not using last use one month ago  1 - Duration: Pt states "years" 1 - Last Use / Amount: Pt states sometimes last month  Substance #2 Name of Substance 2: Opiates (different medications) 2 - Age of First Use: 35 2 - Amount (size/oz): Amounts vary 2 - Frequency: Pt states two weeks ago vague in reference amount stating "a couple pills" 2 - Duration: 8 months in 2017 2 - Last Use / Amount: Two weeks ago date unknown  CIWA: CIWA-Ar BP: (!) 180/105 Pulse Rate: 97 COWS:    Allergies:  Allergies  Allergen Reactions  . Other Other (See Comments)    Pt does not take narcotics, is on Suboxone.   Marland Kitchen Penicillins Rash and Other (See Comments)    Has patient had a PCN reaction causing immediate rash, facial/tongue/throat swelling, SOB or lightheadedness with hypotension: Yes Has patient had a PCN reaction causing severe rash involving mucus membranes or skin necrosis: No Has patient had a PCN reaction that required hospitalization No Has patient had a PCN reaction occurring within the last 10 years: No If all of the above answers are "NO", then may proceed with Cephalosporin use.    Home Medications:  (Not in a hospital admission)  OB/GYN Status:  No LMP recorded. Patient is not currently having periods (Reason: Other).  General Assessment Data Location of Assessment: [facility] ED TTS Assessment: In system Is this a Tele or Face-to-Face Assessment?: Face-to-Face Is this an Initial Assessment or a Re-assessment for this encounter?: Initial Assessment Marital status:  Single Maiden name: NA Is patient pregnant?: No Pregnancy Status: No Living Arrangements: Spouse/significant other Can pt return to current living arrangement?: Yes Admission Status: Voluntary Is patient capable of signing voluntary admission?: Yes Referral Source: Self/Family/Friend Insurance type: Medicaid  Medical Screening Exam [facility] Walk-in ONLY) Medical Exam completed: Yes  Crisis Care Plan Living Arrangements: Spouse/significant other Legal Guardian: Other: (NA) Name of Psychiatrist: Pavlock MD Name of Therapist: Milta Deiters [facility]  Education Status Is patient currently in school?: No Current Grade: NA Highest grade of school patient has completed:  (Some college) Name of school:  (NA) Contact person:  (NA)  Risk to self with the past 6 months Suicidal Ideation: Yes-Currently Present Has patient been a risk to self within the past 6 months prior to admission? : No Suicidal Intent: No Has patient had any suicidal intent within the past 6 months prior to admission? : No Is patient at risk for suicide?: No Suicidal Plan?: No Has patient had any suicidal plan within the past 6 months prior to admission? : No  Access to Means: No What has been your use of drugs/alcohol within the last 12 months?: Denies current use has hx Previous Attempts/Gestures: No How many times?: 0 Other Self Harm Risks:  (NA) Triggers for Past Attempts: Unknown Intentional Self Injurious Behavior: None Family Suicide History: No Recent stressful life event(s): Other (Comment) (Recent medication changes) Persecutory voices/beliefs?: No Depression: No Depression Symptoms:  (NA) Substance abuse history and/or treatment for substance abuse?: Yes Suicide prevention information given to non-admitted patients: Not applicable  Risk to Others within the past 6 months Homicidal Ideation: No Does patient have any lifetime risk of violence toward others beyond the six months prior to admission? :  No Thoughts of Harm to Others: No Current Homicidal Intent: No Current Homicidal Plan: No Access to Homicidal Means: No Identified Victim:  (NA) History of harm to others?: No Assessment of Violence: None Noted Violent Behavior Description:  (NA) Does patient have access to weapons?: No Criminal Charges Pending?: No Does patient have a court date: No Is patient on probation?: No  Psychosis Hallucinations: None noted Delusions: None noted  Mental Status Report Appearance/Hygiene: Unremarkable Eye Contact: Good Motor Activity: Hyperactivity Speech: Rapid, Pressured Level of Consciousness: Restless Mood: Labile Affect: Anxious Anxiety Level: Severe Thought Processes: Coherent, Relevant Judgement: Unimpaired Orientation: Person, Place, Time Obsessive Compulsive Thoughts/Behaviors: None  Cognitive Functioning Concentration: Decreased Memory: Remote Intact IQ: Average Insight: Fair Impulse Control: Fair Appetite: Good Weight Loss: 0 Weight Gain: 0 Sleep: Decreased Total Hours of Sleep: 4 Vegetative Symptoms: None  ADLScreening [facility] Services) Patient's cognitive ability adequate to safely complete daily activities?: Yes Patient able to express need for assistance with ADLs?: Yes Independently performs ADLs?: Yes (appropriate for developmental age)  Prior Inpatient Therapy Prior Inpatient Therapy: Yes Prior Therapy Dates: 2018, 2017, 2016  [facility] Reason for Treatment: MH isssues  Prior Outpatient Therapy Prior Outpatient Therapy: Yes Prior Therapy Dates: Ongoing Prior Therapy Facilty/Provider(s): [facility] Reason for Treatment: MH, SA issues Does patient have an ACCT team?: No Does patient have Intensive In-House Services?  : No Does patient have [facility] services? : No Does patient have P4CC services?: No  ADL Screening (condition at time of admission) Patient's cognitive  ability adequate to safely complete daily activities?: Yes Is the patient deaf or have difficulty hearing?: No Does the patient have difficulty seeing, even when wearing glasses/contacts?: No Does the patient have difficulty concentrating, remembering, or making decisions?: No Patient able to express need for assistance with ADLs?: Yes Does the patient have difficulty dressing or bathing?: No Independently performs ADLs?: Yes (appropriate for developmental age) Does the patient have difficulty walking or climbing stairs?: No Weakness of Legs: None Weakness of Arms/Hands: None  Home Assistive Devices/Equipment Home Assistive Devices/Equipment: None  Therapy Consults (therapy consults require a physician order) PT Evaluation Needed: No OT Evalulation Needed: No SLP Evaluation Needed: No Abuse/Neglect Assessment (Assessment to be complete while patient is alone) Physical Abuse: Denies Verbal Abuse: Denies Sexual Abuse: Denies Exploitation of patient/patient's resources: Denies Self-Neglect: Denies Values / Beliefs Cultural Requests During Hospitalization: None Spiritual Requests During Hospitalization: None Consults Spiritual Care Consult Needed: No Social Work Consult Needed: No Merchant navy officerAdvance Directives (For Healthcare) Does Patient Have a Medical Advance Directive?: No Would patient like information on creating a medical advance directive?: No - Patient declined    Additional Information 1:1 In Past 12 Months?: No CIRT Risk: No Elopement Risk: No Does patient have medical clearance?: Yes  Disposition: Case was staffed with [name] DNP who recommended patient be observed and monitored for safety. Medications to be evaluated and seen by psychiatricy in the a.m. Disposition Initial Assessment Completed for this Encounter: Yes Disposition of Patient: Inpatient treatment program Type of inpatient treatment program: Adult  On Site Evaluation by:   Reviewed with Physician:     Alfredia Ferguson 01/13/2017 3:48 PM

## 2017-01-13 NOTE — ED Notes (Signed)
Pt talking on hallway phone.  

## 2017-01-13 NOTE — ED Notes (Signed)
[id number] Expected date:  Expected time:  Means of arrival:  Comments: [id number]

## 2017-01-13 NOTE — ED Notes (Signed)
Patient seen in her room during rounds. Appear nervous and shaky, some tremors noted. Reports headache of 9/10. Patient accepted PRN of Vistaril for anxiety and Naproxen for pain. Will continue to monitor patient.

## 2017-01-13 NOTE — BH Assessment (Addendum)
[facility] Assessment Progress Note  Case was staffed with Shaune PollackLord DNP who recommended patient be observed and monitored for safety. Medications to be evaluated and seen by psychiatry in the a.m.

## 2017-01-13 NOTE — ED Notes (Signed)
Pt admitted to room #41. Pt anxious on approach. Pt denies SI/HI/AVH. Pt requesting medication management d/t recent medication changes. Pt believes current medications are interacting.  Encouragement and support provided. Special checks q 15 mins in place for safety, Video monitoring in place. COWS protocol in place. Will continue to monitor.

## 2017-01-13 NOTE — ED Triage Notes (Addendum)
Patient states she has been having symptoms from Seroquel and Labetelol. Patient would not go into detail in triage. Patient would not answer when asked how long either. Patient states, "i can not breathe."  At the end of triage patient stated, "I can not see in front of me. This happened before and I was talking  jibber jabber. Patient also reports that she fell walking up the steps and anded on her left arm, but does not c/o left arm pain.

## 2017-01-14 ENCOUNTER — Encounter (HOSPITAL_COMMUNITY): Payer: Self-pay | Admitting: Psychiatry

## 2017-01-14 DIAGNOSIS — Z811 Family history of alcohol abuse and dependence: Secondary | ICD-10-CM | POA: Diagnosis not present

## 2017-01-14 DIAGNOSIS — F329 Major depressive disorder, single episode, unspecified: Secondary | ICD-10-CM | POA: Diagnosis not present

## 2017-01-14 DIAGNOSIS — Z818 Family history of other mental and behavioral disorders: Secondary | ICD-10-CM | POA: Diagnosis not present

## 2017-01-14 DIAGNOSIS — R4587 Impulsiveness: Secondary | ICD-10-CM

## 2017-01-14 DIAGNOSIS — R450 Nervousness: Secondary | ICD-10-CM | POA: Diagnosis not present

## 2017-01-14 DIAGNOSIS — F32A Depression, unspecified: Secondary | ICD-10-CM

## 2017-01-14 DIAGNOSIS — F1910 Other psychoactive substance abuse, uncomplicated: Secondary | ICD-10-CM

## 2017-01-14 DIAGNOSIS — F419 Anxiety disorder, unspecified: Secondary | ICD-10-CM

## 2017-01-14 HISTORY — DX: Anxiety disorder, unspecified: F41.9

## 2017-01-14 HISTORY — DX: Depression, unspecified: F32.A

## 2017-01-14 NOTE — Patient Outreach (Signed)
ED Peer Support Specialist Patient Intake (Complete at intake & 30-60 Day Follow-up)  Name: Megan Edwards  MRN: [id number]  Age: 41 y.o.   Date of Admission: 01/14/2017  Intake: Initial Comments:      Primary Reason Admitted: Patient states she started having symptoms on Friday 01/10/17 reporting: feeling dizzy, loosing her balance and seeing double. Patient stated she "dealt with it" all weekend until today when her symptoms worsened reporting tightness in her chest, vertigo and seeing double. Patient is observed to be in distress during assessment with patient reporting she "can't stop scratching" and feels "bugs are under her skin." Patient states she has been receiving services from [place] MD for the last six months who assists with medication management. Patient reports that provider made some recent medication changes within the last week but cannot recall what those changes where. Patient reports current compliance and states that provider is also assisting her with SA issues/medications.Patient has a history of alcohol and opioid use but denies current use. Patient states she took "a couple pain killers" two weeks ago but cannot recall the details of that incident or medications taken. Patient renders conflicting history in reference to SA use and medication management. Patient denies any previous attempts/gestures at self harm. Patient denies current S/I, H/I or AVH. Patient is time/place oriented and has rapid pressured speech. Per notes, patient has a history of Bipolar disorder and mania.   Lab values: Alcohol/ETOH: Negative Positive UDS? No Amphetamines: No Barbiturates: No Benzodiazepines: Yes Cocaine:   Opiates: Yes Cannabinoids:    Demographic information: Gender: Female Ethnicity: White Marital Status: Single Insurance Status: Patent attorneyMedicaid Receives non-medical governmental assistance (Work Engineer, agriculturalirst/Welfare, Sales executivefood stamps, etc.: Yes ([employer] Health[employer] stamps) Lives with:  Partner/Spouse Living situation: House/Apartment  Reported Patient History: Patient reported health conditions: ADD/ADHD, Bipolar disorder, Schizoaffective disorder, Depression Patient aware of HIV and hepatitis status: No  In past year, has patient visited ED for any reason? Yes  Number of ED visits: 3  Reason(s) for visit: Blood clot and Alcohol disorder  In past year, has patient been hospitalized for any reason? Yes  Number of hospitalizations:    Reason(s) for hospitalization:    In past year, has patient been arrested?    Number of arrests:    Reason(s) for arrest:    In past year, has patient been incarcerated? No  Number of incarcerations:    Reason(s) for incarceration:    In past year, has patient received medication-assisted treatment? Yes, [facility] ([facility]) (Suboxone)  In past year, patient received the following treatments: Psychiatric residential treatment facility  In past year, has patient received any harm reduction services? No  Did this include any of the following?    In past year, has patient received care from a mental health provider for diagnosis other than SUD? No  In past year, is this first time patient has overdosed? No  Number of past overdoses:    In past year, is this first time patient has been hospitalized for an overdose?    Number of hospitalizations for overdose(s):    Is patient currently receiving treatment for a mental health diagnosis? Yes ([facility])  Patient reports experiencing difficulty participating in SUD treatment: No    Most important reason(s) for this difficulty?    Has patient received prior services for treatment? Yes ([facility])  In past, patient has received services from following agencies: [facility] Outpatient Services, [facility] ([facility]), [facility] ([facility]), [facility]  Plan of Care:  Suggested  follow up at these agencies/treatment centers: [facility] ([facility]) ([facility])  Other information:  CPSS Arlys John and Jonny Ruiz talked with Pt to monitor services and to understand what Pt was intending on doing after discharge. CPSS Arlys John and Jonny Ruiz addressed  CPSS position in the ED department. CPSS was made aware that Pt has services and that she wanted to get her medications regulated and gather herself so that she can get back to her child. CPSS gave her contact information so that if Pt needed assistance with finding outside services or needs support, she can contact either CPSS. CPSS encouraged Pt to follow up with [facility] and continue to follow up with there services.    Arlys John [place], CPSS  01/14/2017 12:40 PM

## 2017-01-14 NOTE — ED Notes (Signed)
Pt d/c home per MD order. Discharge summary reviewed with pt. Pt verbalizes understanding. Pt denies SI/HI. Pt signed for personal property and property returned. Pt signed e-signature. Ambulatory off unit with MHT.

## 2017-01-14 NOTE — ED Notes (Signed)
Peer support at bedside 

## 2017-01-14 NOTE — Discharge Instructions (Signed)
To help you maintain a sober lifestyle, a substance abuse treatment program may be beneficial to you.  Contact one of the following facilities at your earliest opportunity to ask about enrolling:  RESIDENTIAL PROGRAMS:       [facility]      [address]      [place], [place] [address]      [phone]       [facility]      [address]      [place], [place] [address]      [phone]       [facility]      [address]      [place], [place] [address]      [phone]   OUTPATIENT PROGRAMS:       [facility]      [address]      [place], [place] [address]      [phone]      New patients are seen at the walk-in clinic every Tuesday from 9:00 am - 12:00 pm.

## 2017-01-14 NOTE — Consult Note (Signed)
[facility] Consult   Reason for Consult:  Anxiety and depression Referring Physician:  EDP Patient Identification: Megan Edwards MRN:  [id number] Principal Diagnosis: Anxiety and depression Diagnosis:   Patient Active Problem List   Diagnosis Date Noted  . Anxiety and depression [F41.9, F32.9] 01/14/2017  . Pulmonary embolism ([place]) [I26.99] 12/19/2016  . Alcohol withdrawal ([place]) [F10.239] 12/01/2016  . ADD (attention deficit disorder) [F98.8] 05/10/2016  . Head ache [R51] 05/08/2016  . Opioid use disorder, mild, in controlled environment ([place]) [F11.10] 05/06/2016  . Essential hypertension [I10] 05/06/2016  . Alcohol use disorder, moderate, in early remission, dependence ([place]) [F10.21] 05/06/2016  . Bipolar I disorder, most recent episode manic, severe with psychotic features ([place]) [F31.2] 01/20/2016  . Chronic pain syndrome [G89.4]   . Morbid obesity due to excess calories ([place]) [E66.01]     Total Time spent with patient: 45 minutes  Subjective:   Megan Edwards is a 41 y.o. female patient admitted with worsening anxiety and depression. Marland Kitchen  HPI:  Pt was seen and chart reviewed with treatment team, Dr Darleene Cleaver and Dr Mariea Clonts. Pt presented [facility] to the [facility] with complaints of an allergic reaction. Pt stated she felt as if she was having a manic episode on admission and psychiatry was consulted. Per chart review Pt has a history of  Bipolar 1 with manic features, alcohol abuse and opiate abuse. Pt's UDS and BAL were negative. Pt stated she is trying to taper off suboxone and is trying to get into a long term substance abuse program. Pt has a care coordinator and peer support in the community. Today,  Pt denies suicidal/homicidal ideation, denies auditory/visual hallucinations and does not appear to be responding to internal stimuli. Pt is stable and psychiatrically clear for discharge.   Past Psychiatric History: As above  Risk to Self: None Risk to Others:  None Prior Inpatient Therapy: Prior Inpatient Therapy: Yes Prior Therapy Dates: 2018, 2017, 2016  Prior Therapy [facility]ilty[facility]Provider([facility]): [facility], [place] Reason for Treatment: MH isssues Prior Outpatient Therapy: Prior Outpatient Therapy: Yes Prior Therapy Dates: Ongoing Prior Therapy Facilty/Provider(s): [facility] Reason for Treatment: MH, SA issues Does patient have an ACCT team?: No Does patient have Intensive In-House Services?  : No Does patient have [facility] services? : No Does patient have P4[facility] services?: No  Past Medical History:  Past Medical History:  Diagnosis Date  . Abnormal Pap smear   . Alcohol abuse   . Anxiety and depression 01/14/2017  . Bipolar 1 disorder ([place])   . Depression   . Genital herpes 2000  . Hypertension   . Infection    UTI  . Ovarian cyst   . Schizoaffective disorder ([place]) 05/05/2016  . SVT (supraventricular tachycardia) ([facility])     Past Surgical History:  Procedure Laterality Date  . addenoidectomy    . BREAST ENHANCEMENT SURGERY    . BREAST SURGERY    . KNEE SURGERY    . KNEE SURGERY Left   . TONSILLECTOMY    . WISDOM TOOTH EXTRACTION     Family History:  Family History  Problem Relation Age of Onset  . Alcoholism Unknown   . Leukemia Unknown   . Cervical cancer Unknown   . Bone cancer Unknown   . Cirrhosis Unknown   . Diabetes Mellitus II Unknown   . Depression Unknown   . Hypertension Unknown   . Thyroid cancer Unknown   . Diabetes Mellitus II Mother   . Hypertension Mother   .  Alcoholism Father   . Mental illness Paternal Grandmother    Family Psychiatric  History: Unknown Social History:  History  Alcohol Use  . 0.0 oz/week    Comment: states has not drank alcohol since 12/19/16     History  Drug Use No    Comment: former-opiate use    Social History   Social History  . Marital status: Divorced    Spouse name: N/A  . Number of children: N/A  . Years of education: N/A   Occupational  History  . [employer]   Social History Main Topics  . Smoking status: Never Smoker  . Smokeless tobacco: Never Used  . Alcohol use 0.0 oz/week     Comment: states has not drank alcohol since 12/19/16  . Drug use: No     Comment: former-opiate use  . Sexual activity: Not Currently    Birth control/ protection: Abstinence, None   Other Topics Concern  . None   Social History Narrative  . None   Additional Social History:    Allergies:   Allergies  Allergen Reactions  . Other Other (See Comments)    Pt does not take narcotics, is on Suboxone.   Marland Kitchen Penicillins Rash and Other (See Comments)    Has patient had a PCN reaction causing immediate rash, facial/tongue/throat swelling, SOB or lightheadedness with hypotension: Yes Has patient had a PCN reaction causing severe rash involving mucus membranes or skin necrosis: No Has patient had a PCN reaction that required hospitalization No Has patient had a PCN reaction occurring within the last 10 years: No If all of the above answers are "NO", then may proceed with Cephalosporin use.    Labs:  Results for orders placed or performed during the hospital encounter of 01/13/17 (from the past 48 hour(s))  CBC with Differential/Platelet     Status: None   Collection Time: 01/13/17  1:09 PM  Result Value Ref Range   WBC 8.5 4.0 - 10.5 K/uL   RBC 4.72 3.87 - 5.11 MIL/uL   Hemoglobin 13.4 12.0 - 15.0 g/dL   HCT 39.7 36.0 - 46.0 %   MCV 84.1 78.0 - 100.0 fL   MCH 28.4 26.0 - 34.0 pg   MCHC 33.8 30.0 - 36.0 g/dL   RDW 14.4 11.5 - 15.5 %   Platelets 273 150 - 400 K/uL   Neutrophils Relative % 74 %   Neutro Abs 6.2 1.7 - 7.7 K/uL   Lymphocytes Relative 21 %   Lymphs Abs 1.7 0.7 - 4.0 K/uL   Monocytes Relative 3 %   Monocytes Absolute 0.3 0.1 - 1.0 K/uL   Eosinophils Relative 2 %   Eosinophils Absolute 0.2 0.0 - 0.7 K/uL   Basophils Relative 0 %   Basophils Absolute 0.0 0.0 - 0.1 K/uL  Comprehensive metabolic panel      Status: Abnormal   Collection Time: 01/13/17  1:09 PM  Result Value Ref Range   Sodium 138 135 - 145 mmol/L   Potassium 4.2 3.5 - 5.1 mmol/L   Chloride 99 (L) 101 - 111 mmol/L   CO2 26 22 - 32 mmol/L   Glucose, Bld 106 (H) 65 - 99 mg/dL   BUN 10 6 - 20 mg/dL   Creatinine, Ser 0.83 0.44 - 1.00 mg/dL   Calcium 9.6 8.9 - 10.3 mg/dL   Total Protein 7.9 6.5 - 8.1 g/dL   Albumin 4.5 3.5 - 5.0 g/dL   AST 26 15 - 41 U/L  ALT 27 14 - 54 U/L   Alkaline Phosphatase 102 38 - 126 U/L   Total Bilirubin 0.5 0.3 - 1.2 mg/dL   GFR calc non Af Amer >60 >60 mL/min   GFR calc Af Amer >60 >60 mL/min    Comment: (NOTE) The eGFR has been calculated using the CKD EPI equation. This calculation has not been validated in all clinical situations. eGFR's persistently <60 mL/min signify possible Chronic Kidney Disease.    Anion gap 13 5 - 15  Lithium level     Status: Abnormal   Collection Time: 01/13/17  1:09 PM  Result Value Ref Range   Lithium Lvl 0.16 (L) 0.60 - 1.20 mmol/L  Ethanol     Status: None   Collection Time: 01/13/17  2:40 PM  Result Value Ref Range   Alcohol, Ethyl (B) <10 <10 mg/dL    Comment: CORRECTED RESULTS CALLED TO: KOOS,C @ 1638 ON 102218 BY POTEAT,S CORRECTED ON 10/22 AT 1633: PREVIOUSLY REPORTED AS <5        LOWEST DETECTABLE LIMIT FOR SERUM ALCOHOL IS 10 mg/dL FOR MEDICAL PURPOSES ONLY   Rapid urine drug screen (hospital performed)     Status: None   Collection Time: 01/13/17  3:36 PM  Result Value Ref Range   Opiates NONE DETECTED NONE DETECTED   Cocaine NONE DETECTED NONE DETECTED   Benzodiazepines NONE DETECTED NONE DETECTED   Amphetamines NONE DETECTED NONE DETECTED   Tetrahydrocannabinol NONE DETECTED NONE DETECTED   Barbiturates NONE DETECTED NONE DETECTED    Comment:        DRUG SCREEN FOR MEDICAL PURPOSES ONLY.  IF CONFIRMATION IS NEEDED FOR ANY PURPOSE, NOTIFY LAB WITHIN 5 DAYS.        LOWEST DETECTABLE LIMITS FOR URINE DRUG SCREEN Drug Class       Cutoff  (ng/mL) Amphetamine      1000 Barbiturate      200 Benzodiazepine   825 Tricyclics       003 Opiates          300 Cocaine          300 THC              50   POC urine preg, ED     Status: None   Collection Time: 01/13/17  3:44 PM  Result Value Ref Range   Preg Test, Ur NEGATIVE NEGATIVE    Comment:        THE SENSITIVITY OF THIS METHODOLOGY IS >24 mIU/mL     Current Facility-Administered Medications  Medication Dose Route Frequency Provider Last Rate Last Dose  . acetaminophen (TYLENOL) tablet 650 mg  650 mg Oral Q4H PRN Blanchie Dessert, MD      . apixaban (ELIQUIS) tablet 5 mg  5 mg Oral BID Mesner, Corene Cornea, MD   5 mg at 01/14/17 0934  . cloNIDine (CATAPRES) tablet 0.1 mg  0.1 mg Oral QID Patrecia Pour, NP   0.1 mg at 01/14/17 0934   Followed by  . [START ON 01/16/2017] cloNIDine (CATAPRES) tablet 0.1 mg  0.1 mg Oral BH-qamhs Lord, Asa Saunas, NP       Followed by  . [START ON 01/18/2017] cloNIDine (CATAPRES) tablet 0.1 mg  0.1 mg Oral QAC breakfast Patrecia Pour, NP      . dicyclomine (BENTYL) tablet 20 mg  20 mg Oral Q6H PRN Patrecia Pour, NP      . gabapentin (NEURONTIN) capsule 300 mg  300 mg Oral TID Reita Cliche,  Asa Saunas, NP   300 mg at 01/14/17 0934  . hydrOXYzine (ATARAX/VISTARIL) tablet 25 mg  25 mg Oral Q6H PRN Patrecia Pour, NP   25 mg at 01/13/17 1933  . labetalol (NORMODYNE) tablet 100 mg  100 mg Oral QPC breakfast Blanchie Dessert, MD   100 mg at 01/14/17 0934  . labetalol (NORMODYNE) tablet 200 mg  200 mg Oral QPC supper Blanchie Dessert, MD   200 mg at 01/13/17 2135  . lithium carbonate capsule 900 mg  900 mg Oral QHS Blanchie Dessert, MD   900 mg at 01/13/17 2134  . loperamide (IMODIUM) capsule 2-4 mg  2-4 mg Oral PRN Patrecia Pour, NP      . methocarbamol (ROBAXIN) tablet 500 mg  500 mg Oral Q8H PRN Patrecia Pour, NP      . montelukast (SINGULAIR) tablet 10 mg  10 mg Oral QHS Blanchie Dessert, MD   10 mg at 01/13/17 2133  . naproxen (NAPROSYN) tablet  500 mg  500 mg Oral BID PRN Patrecia Pour, NP   500 mg at 01/13/17 1933  . ondansetron (ZOFRAN-ODT) disintegrating tablet 4 mg  4 mg Oral Q6H PRN Patrecia Pour, NP      . paliperidone (INVEGA) 24 hr tablet 9 mg  9 mg Oral QHS Blanchie Dessert, MD   9 mg at 01/13/17 2134  . prazosin (MINIPRESS) capsule 2 mg  2 mg Oral QHS Blanchie Dessert, MD   2 mg at 01/13/17 2133  . QUEtiapine (SEROQUEL) tablet 200 mg  200 mg Oral QHS PRN Blanchie Dessert, MD   200 mg at 01/13/17 2134   Current Outpatient Prescriptions  Medication Sig Dispense Refill  . atomoxetine (STRATTERA) 80 MG capsule Take 80 mg by mouth daily.  0  . benztropine (COGENTIN) 1 MG tablet Take 1 tablet (1 mg total) by mouth at bedtime. For prevention drug related tremors. 30 tablet 0  . buprenorphine-naloxone (SUBOXONE) 8-2 mg SUBL SL tablet Place 2 tablets under the tongue daily.     Marland Kitchen ELIQUIS STARTER PACK (ELIQUIS STARTER PACK) 5 MG TABS Take as directed on package: start with two-63m tablets twice daily for 7 days. On day 8, switch to one-527mtablet twice daily. 1 each 0  . gabapentin (NEURONTIN) 100 MG capsule Take 200 mg by mouth at bedtime.  2  . labetalol (NORMODYNE) 100 MG tablet Take 1 tablet (100 mg) in the morning & 2 tablets (200 mg) in the evening: For high blood pressure 21 tablet 0  . lithium carbonate 300 MG capsule Take 3 capsules (900 mg total) by mouth at bedtime. For mood stabilization 90 capsule 0  . montelukast (SINGULAIR) 10 MG tablet Take 10 mg by mouth at bedtime.    . paliperidone (INVEGA) 9 MG 24 hr tablet Take 1 tablet (9 mg total) by mouth at bedtime. For mood control 30 tablet 0  . PARoxetine (PAXIL) 20 MG tablet Take 20 mg by mouth daily.  0  . prazosin (MINIPRESS) 2 MG capsule Take 1 capsule (2 mg total) by mouth at bedtime. For nightmares 30 capsule 0  . QUEtiapine (SEROQUEL) 200 MG tablet Take 200 mg by mouth at bedtime as needed (sleep).   5    Musculoskeletal: Strength & Muscle Tone: within normal  limits Gait & Station: normal Patient leans: N/A  Psychiatric Specialty Exam: Physical Exam  Constitutional: She is oriented to person, place, and time. She appears well-developed and well-nourished.  HENT:  Head: Normocephalic.  Respiratory: Effort normal.  Musculoskeletal: Normal range of motion.  Neurological: She is alert and oriented to person, place, and time.  Psychiatric: Her behavior is normal. Thought content normal. Her mood appears anxious. Cognition and memory are normal. She expresses impulsivity. She exhibits a depressed mood.    Review of Systems  Psychiatric/Behavioral: Positive for depression and substance abuse (history of alcohol and opiate abuse). Negative for hallucinations, memory loss and suicidal ideas. The patient is nervous/anxious ( Discharge Home). The patient does not have insomnia.   All other systems reviewed and are negative.   Blood pressure 122/67, pulse 83, temperature 98.1 F (36.7 C), temperature source Oral, resp. rate 16, height _0  (1.702 m), weight 90.7 kg (200 lb), SpO2 99 %, not currently breastfeeding.Body mass index is 31.32 kg/m.  General Appearance: Casual  Eye Contact:  Good  Speech:  Clear and Coherent  Volume:  Normal  Mood:  Anxious and Depressed  Affect:  Congruent and Depressed  Thought Process:  Coherent, Goal Directed and Linear  Orientation:  Full (Time, Place, and Person)  Thought Content:  Logical and Ideas of Reference:   Paranoia  Suicidal Thoughts:  No  Homicidal Thoughts:  No  Memory:  Immediate;   Good Recent;   Good Remote;   Fair  Judgement:  Fair  Insight:  Fair  Psychomotor Activity:  Normal  Concentration:  Concentration: Good and Attention Span: Good  Recall:  Good  Fund of Knowledge:  Good  Language:  Good  Akathisia:  No  Handed:  Right  AIMS (if indicated):     Assets:  Communication Skills Desire for Improvement Financial Resources/Insurance Housing Intimacy Leisure Time Physical  Health Resilience Social Support  ADL's:  Intact  Cognition:  WNL  Sleep:        Treatment Plan Summary: Plan Anxiety and depression  Follow up with Dr Alphonzo Grieve for medication management Follow up with Peer Support for assistance in finding substance abuse resources in the community. Avoid the use of alcohol and illicit drugs Take all medications as prescribed  Disposition: No evidence of imminent risk to self or others at present.   Patient does not meet criteria for psychiatric inpatient admission. Supportive therapy provided about ongoing stressors. Discussed crisis plan, support from social network, calling 911, coming to the Emergency Department, and calling Suicide Hotline.  Ethelene Hal, NP 01/14/2017 11:51 AM  Patient seen face-to-face for psychiatric evaluation, chart reviewed and case discussed with the physician extender and developed treatment plan. Reviewed the information documented and agree with the treatment plan. Corena Pilgrim, MD

## 2017-01-14 NOTE — [facility] Suicide Risk Assessment (Signed)
Suicide Risk Assessment  Discharge Assessment   [place] [facility] Discharge Suicide Risk Assessment   Principal Problem: Anxiety and depression Discharge Diagnoses:  Patient Active Problem List   Diagnosis Date Noted  . Anxiety and depression [F41.9, F32.9] 01/14/2017  . Pulmonary embolism (HCC) [I26.99] 12/19/2016  . Alcohol withdrawal (HCC) [F10.239] 12/01/2016  . ADD (attention deficit disorder) [F98.8] 05/10/2016  . Head ache [R51] 05/08/2016  . Opioid use disorder, mild, in controlled environment (HCC) [F11.10] 05/06/2016  . Essential hypertension [I10] 05/06/2016  . Alcohol use disorder, moderate, in early remission, dependence (HCC) [F10.21] 05/06/2016  . Bipolar I disorder, most recent episode manic, severe with psychotic features (HCC) [F31.2] 01/20/2016  . Chronic pain syndrome [G89.4]   . Morbid obesity due to excess calories (HCC) [E66.01]     Total Time spent with patient: 45 minutes  Musculoskeletal: Strength & Muscle Tone: within normal limits Gait & Station: normal Patient leans: N/A   Psychiatric Specialty Exam: Physical Exam  Constitutional: She is oriented to person, place, and time. She appears well-developed and well-nourished.  HENT:  Head: Normocephalic.  Respiratory: Effort normal.  Musculoskeletal: Normal range of motion.  Neurological: She is alert and oriented to person, place, and time.  Psychiatric: Her behavior is normal. Thought content normal. Her mood appears anxious. Cognition and memory are normal. She expresses impulsivity. She exhibits a depressed mood.   Review of Systems  Psychiatric/Behavioral: Positive for depression and substance abuse (history of alcohol and opiate abuse). Negative for hallucinations, memory loss and suicidal ideas. The patient is nervous/anxious ( Discharge Home). The patient does not have insomnia.   All other systems reviewed and are negative.  Blood pressure 122/67, pulse 83, temperature 98.1 F (36.7 C), temperature  source Oral, resp. rate 16, height 5\' 7"  (1.702 m), weight 90.7 kg (200 lb), SpO2 99 %, not currently breastfeeding.Body mass index is 31.32 kg/m. General Appearance: Casual Eye Contact:  Good Speech:  Clear and Coherent Volume:  Normal Mood:  Anxious and Depressed Affect:  Congruent and Depressed Thought Process:  Coherent, Goal Directed and Linear Orientation:  Full (Time, Place, and Person) Thought Content:  Logical and Ideas of Reference:   Paranoia Suicidal Thoughts:  No Homicidal Thoughts:  No Memory:  Immediate;   Good Recent;   Good Remote;   Fair Judgement:  Fair Insight:  Fair Psychomotor Activity:  Normal Concentration:  Concentration: Good and Attention Span: Good Recall:  Good Fund of Knowledge:  Good Language:  Good Akathisia:  No Handed:  Right AIMS (if indicated):    Assets:  Communication Skills Desire for Improvement Financial Resources/Insurance Housing Intimacy Leisure Time Physical Health Resilience Social Support ADL's:  Intact Cognition:  WNL   Mental Status Per Nursing Assessment::   On Admission:   anxious   Demographic Factors:  Caucasian and Low socioeconomic status  Loss Factors: Financial problems/change in socioeconomic status  Historical Factors: Impulsivity  Risk Reduction Factors:   Responsible for children under 81 years of age, Sense of responsibility to family, Living with another person, especially a relative, Positive social support and Positive therapeutic relationship  Continued Clinical Symptoms:  Severe Anxiety and/or Agitation Bipolar Disorder:   Mixed State Alcohol/Substance Abuse/Dependencies More than one psychiatric diagnosis Previous Psychiatric Diagnoses and Treatments Medical Diagnoses and Treatments/Surgeries  Cognitive Features That Contribute To Risk:  Closed-mindedness    Suicide Risk:  Minimal: No identifiable suicidal ideation.  Patients presenting with no risk factors but with morbid  ruminations; may be classified as minimal risk based on  the severity of the depressive symptoms    Plan Of Care/Follow-up recommendations:  Activity:  as tolerated Diet:  Heart Healthy  Megan AbbeLaurie Britton Amarilis Belflower, NP 01/14/2017, 12:06 PM

## 2017-01-14 NOTE — BH Assessment (Addendum)
[facility] Assessment Progress Note  Per Thedore MinsMojeed Akintayo, MD, this pt does not require psychiatric hospitalization at this time.  [facility] referral information for area substance abuse treatment programs.  This has been included in pt's discharge instructions, along with information about the [facility], as requested by the pt.  Pt would also benefit from seeing Peer Support Specialists; they will be asked to speak to pt.  Pt's nurse, Morrie Sheldonshley, has been notified.  Doylene Canninghomas Joyceline Maiorino, MA Triage Specialist [phone]

## 2017-01-17 ENCOUNTER — Encounter (HOSPITAL_COMMUNITY): Payer: Self-pay | Admitting: *Deleted

## 2017-01-17 ENCOUNTER — Emergency Department (HOSPITAL_COMMUNITY)
Admission: EM | Admit: 2017-01-17 | Discharge: 2017-01-18 | Disposition: A | Discharge status: Home / Self Care | Payer: Medicaid Other | Attending: Emergency Medicine | Admitting: Emergency Medicine

## 2017-01-17 DIAGNOSIS — F1014 Alcohol abuse with alcohol-induced mood disorder: Secondary | ICD-10-CM | POA: Diagnosis not present

## 2017-01-17 DIAGNOSIS — F329 Major depressive disorder, single episode, unspecified: Secondary | ICD-10-CM | POA: Diagnosis not present

## 2017-01-17 DIAGNOSIS — Z79899 Other long term (current) drug therapy: Secondary | ICD-10-CM | POA: Diagnosis not present

## 2017-01-17 DIAGNOSIS — F10230 Alcohol dependence with withdrawal, uncomplicated: Secondary | ICD-10-CM | POA: Diagnosis not present

## 2017-01-17 DIAGNOSIS — F1110 Opioid abuse, uncomplicated: Secondary | ICD-10-CM | POA: Diagnosis not present

## 2017-01-17 DIAGNOSIS — F1021 Alcohol dependence, in remission: Secondary | ICD-10-CM

## 2017-01-17 DIAGNOSIS — Z7901 Long term (current) use of anticoagulants: Secondary | ICD-10-CM | POA: Diagnosis not present

## 2017-01-17 DIAGNOSIS — Z811 Family history of alcohol abuse and dependence: Secondary | ICD-10-CM | POA: Diagnosis not present

## 2017-01-17 DIAGNOSIS — F1010 Alcohol abuse, uncomplicated: Secondary | ICD-10-CM

## 2017-01-17 DIAGNOSIS — Z818 Family history of other mental and behavioral disorders: Secondary | ICD-10-CM | POA: Diagnosis not present

## 2017-01-17 DIAGNOSIS — I10 Essential (primary) hypertension: Secondary | ICD-10-CM | POA: Insufficient documentation

## 2017-01-17 DIAGNOSIS — F10930 Alcohol use, unspecified with withdrawal, uncomplicated: Secondary | ICD-10-CM

## 2017-01-17 DIAGNOSIS — F419 Anxiety disorder, unspecified: Secondary | ICD-10-CM | POA: Diagnosis present

## 2017-01-17 LAB — COMPREHENSIVE METABOLIC PANEL
ALT: 28 U/L (ref 14–54)
AST: 22 U/L (ref 15–41)
Albumin: 4.8 g/dL (ref 3.5–5.0)
Alkaline Phosphatase: 106 U/L (ref 38–126)
Anion gap: 15 (ref 5–15)
BUN: 9 mg/dL (ref 6–20)
CO2: 24 mmol/L (ref 22–32)
Calcium: 9.8 mg/dL (ref 8.9–10.3)
Chloride: 100 mmol/L — ABNORMAL LOW (ref 101–111)
Creatinine, Ser: 0.75 mg/dL (ref 0.44–1.00)
GFR calc Af Amer: >60 mL/min (ref >60)
GFR calc non Af Amer: >60 mL/min (ref >60)
Glucose, Bld: 107 mg/dL — ABNORMAL HIGH (ref 65–99)
Potassium: 3.2 mmol/L — ABNORMAL LOW (ref 3.5–5.1)
Sodium: 139 mmol/L (ref 135–145)
Total Bilirubin: 0.6 mg/dL (ref 0.3–1.2)
Total Protein: 8.5 g/dL — ABNORMAL HIGH (ref 6.5–8.1)

## 2017-01-17 LAB — ACETAMINOPHEN LEVEL: Acetaminophen (Tylenol), Serum: <10 ug/mL — ABNORMAL LOW (ref 10–30)

## 2017-01-17 LAB — CBC
HCT: 41.3 % (ref 36.0–46.0)
Hemoglobin: 14.4 g/dL (ref 12.0–15.0)
MCH: 28.5 pg (ref 26.0–34.0)
MCHC: 34.9 g/dL (ref 30.0–36.0)
MCV: 81.8 fL (ref 78.0–100.0)
Platelets: 284 10*3/uL (ref 150–400)
RBC: 5.05 MIL/uL (ref 3.87–5.11)
RDW: 14.6 % (ref 11.5–15.5)
WBC: 12.7 10*3/uL — ABNORMAL HIGH (ref 4.0–10.5)

## 2017-01-17 LAB — ETHANOL: Alcohol, Ethyl (B): 47 mg/dL — ABNORMAL HIGH (ref <10)

## 2017-01-17 LAB — RAPID URINE DRUG SCREEN, HOSP PERFORMED
Amphetamines: NOT DETECTED
Barbiturates: NOT DETECTED
Benzodiazepines: NOT DETECTED
Cocaine: NOT DETECTED
Opiates: NOT DETECTED
Tetrahydrocannabinol: NOT DETECTED

## 2017-01-17 LAB — SALICYLATE LEVEL: Salicylate Lvl: <7 mg/dL (ref 2.8–30.0)

## 2017-01-17 MED ORDER — ZOLPIDEM TARTRATE 5 MG PO TABS: 5.0000 mg | ORAL_TABLET | Freq: Every evening | ORAL | Status: DC | PRN

## 2017-01-17 MED ORDER — ELIQUIS 5 MG VTE STARTER PACK: ORAL_TABLET | Freq: Two times a day (BID) | ORAL | Status: DC

## 2017-01-17 MED ORDER — ONDANSETRON HCL 4 MG PO TABS: 4.0000 mg | ORAL_TABLET | Freq: Three times a day (TID) | ORAL | Status: DC | PRN

## 2017-01-17 MED ORDER — LABETALOL HCL 200 MG PO TABS
200.0000 mg | ORAL_TABLET | Freq: Every day | ORAL | Status: DC
  Administered 2017-01-17: 200 mg via ORAL
  Filled 2017-01-17: qty 1

## 2017-01-17 MED ORDER — LORAZEPAM 2 MG/ML IJ SOLN
0.0000 mg | Freq: Four times a day (QID) | INTRAMUSCULAR | Status: DC
Start: 2017-01-17 — End: 2017-01-18

## 2017-01-17 MED ORDER — LABETALOL HCL 100 MG PO TABS: 100.0000 mg | ORAL_TABLET | Freq: Two times a day (BID) | ORAL | Status: DC

## 2017-01-17 MED ORDER — BENZTROPINE MESYLATE 1 MG PO TABS: 1.0000 mg | ORAL_TABLET | Freq: Every day | ORAL | Status: DC

## 2017-01-17 MED ORDER — QUETIAPINE FUMARATE 100 MG PO TABS
200.0000 mg | ORAL_TABLET | Freq: Every evening | ORAL | Status: DC | PRN
  Administered 2017-01-18: 200 mg via ORAL
  Filled 2017-01-17: qty 2

## 2017-01-17 MED ORDER — LOPERAMIDE HCL 2 MG PO CAPS: 2.0000 mg | ORAL_CAPSULE | ORAL | Status: DC | PRN

## 2017-01-17 MED ORDER — HYDROXYZINE HCL 25 MG PO TABS: 25.0000 mg | ORAL_TABLET | Freq: Four times a day (QID) | ORAL | Status: DC | PRN

## 2017-01-17 MED ORDER — GABAPENTIN 300 MG PO CAPS
300.0000 mg | ORAL_CAPSULE | Freq: Three times a day (TID) | ORAL | Status: DC
  Administered 2017-01-17 – 2017-01-18 (×3): 300 mg via ORAL
  Filled 2017-01-17: qty 3
  Filled 2017-01-17 (×2): qty 1

## 2017-01-17 MED ORDER — ALUM & MAG HYDROXIDE-SIMETH 200-200-20 MG/5ML PO SUSP
30.0000 mL | Freq: Four times a day (QID) | ORAL | Status: DC | PRN
Start: 2017-01-17 — End: 2017-01-18

## 2017-01-17 MED ORDER — METHOCARBAMOL 500 MG PO TABS: 500.0000 mg | ORAL_TABLET | Freq: Three times a day (TID) | ORAL | Status: DC | PRN

## 2017-01-17 MED ORDER — DICYCLOMINE HCL 20 MG PO TABS: 20.0000 mg | ORAL_TABLET | Freq: Four times a day (QID) | ORAL | Status: DC | PRN

## 2017-01-17 MED ORDER — ONDANSETRON 4 MG PO TBDP: 4.0000 mg | ORAL_TABLET | Freq: Four times a day (QID) | ORAL | Status: DC | PRN

## 2017-01-17 MED ORDER — APIXABAN 5 MG PO TABS
5.0000 mg | ORAL_TABLET | Freq: Two times a day (BID) | ORAL | Status: DC
  Administered 2017-01-17 – 2017-01-18 (×2): 5 mg via ORAL
  Filled 2017-01-17 (×2): qty 1

## 2017-01-17 MED ORDER — LABETALOL HCL 100 MG PO TABS
100.0000 mg | ORAL_TABLET | Freq: Every day | ORAL | Status: DC
  Administered 2017-01-17 – 2017-01-18 (×2): 100 mg via ORAL
  Filled 2017-01-17 (×2): qty 1

## 2017-01-17 MED ORDER — VITAMIN B-1 100 MG PO TABS
100.0000 mg | ORAL_TABLET | Freq: Every day | ORAL | Status: DC
  Administered 2017-01-17 – 2017-01-18 (×2): 100 mg via ORAL
  Filled 2017-01-17 (×2): qty 1

## 2017-01-17 MED ORDER — PRAZOSIN HCL 1 MG PO CAPS
2.0000 mg | ORAL_CAPSULE | Freq: Every day | ORAL | Status: DC
  Administered 2017-01-17: 2 mg via ORAL
  Filled 2017-01-17: qty 2

## 2017-01-17 MED ORDER — NICOTINE 21 MG/24HR TD PT24
21.0000 mg | MEDICATED_PATCH | Freq: Every day | TRANSDERMAL | Status: DC
  Filled 2017-01-17: qty 1

## 2017-01-17 MED ORDER — NAPROXEN 500 MG PO TABS: 500.0000 mg | ORAL_TABLET | Freq: Two times a day (BID) | ORAL | Status: DC | PRN

## 2017-01-17 MED ORDER — GABAPENTIN 100 MG PO CAPS: 200.0000 mg | ORAL_CAPSULE | Freq: Every day | ORAL | Status: DC

## 2017-01-17 MED ORDER — MONTELUKAST SODIUM 10 MG PO TABS
10.0000 mg | ORAL_TABLET | Freq: Every day | ORAL | Status: DC
  Administered 2017-01-17: 10 mg via ORAL
  Filled 2017-01-17: qty 1

## 2017-01-17 MED ORDER — LORAZEPAM 1 MG PO TABS
0.0000 mg | ORAL_TABLET | Freq: Four times a day (QID) | ORAL | Status: DC
  Administered 2017-01-17 (×2): 4 mg via ORAL
  Administered 2017-01-18: 2 mg via ORAL
  Filled 2017-01-17 (×2): qty 2
  Filled 2017-01-17 (×2): qty 4

## 2017-01-17 MED ORDER — PAROXETINE HCL 20 MG PO TABS
20.0000 mg | ORAL_TABLET | Freq: Every day | ORAL | Status: DC
  Administered 2017-01-17 – 2017-01-18 (×2): 20 mg via ORAL
  Filled 2017-01-17 (×2): qty 1

## 2017-01-17 MED ORDER — LITHIUM CARBONATE 300 MG PO CAPS
450.0000 mg | ORAL_CAPSULE | Freq: Two times a day (BID) | ORAL | Status: DC
  Administered 2017-01-17 – 2017-01-18 (×2): 450 mg via ORAL
  Filled 2017-01-17 (×2): qty 1

## 2017-01-17 MED ORDER — LORAZEPAM 1 MG PO TABS
0.0000 mg | ORAL_TABLET | Freq: Two times a day (BID) | ORAL | Status: DC
  Administered 2017-01-18: 2 mg via ORAL

## 2017-01-17 MED ORDER — PALIPERIDONE ER 6 MG PO TB24
9.0000 mg | ORAL_TABLET | Freq: Every day | ORAL | Status: DC
  Administered 2017-01-17: 21:00:00 9 mg via ORAL
  Filled 2017-01-17: qty 1

## 2017-01-17 MED ORDER — THIAMINE HCL 100 MG/ML IJ SOLN: 100.0000 mg | Freq: Every day | INTRAMUSCULAR | Status: DC

## 2017-01-17 MED ORDER — LITHIUM CARBONATE 300 MG PO CAPS: 900.0000 mg | ORAL_CAPSULE | Freq: Every day | ORAL | Status: DC

## 2017-01-17 MED ORDER — ACETAMINOPHEN 325 MG PO TABS: 650.0000 mg | ORAL_TABLET | ORAL | Status: DC | PRN

## 2017-01-17 MED ORDER — LORAZEPAM 2 MG/ML IJ SOLN
0.0000 mg | Freq: Two times a day (BID) | INTRAMUSCULAR | Status: DC
Start: 2017-01-19 — End: 2017-01-18

## 2017-01-17 NOTE — ED Provider Notes (Signed)
[facility]-EMERGENCY DEPT Provider Note   CSN: [id number] Arrival date & time: 01/17/17  1321     History   Chief Complaint Chief Complaint  Patient presents with  . Drug / Alcohol Assessment  . Suicidal  . Alcohol Problem    HPI Megan Edwards is a 41 y.o. female.  HPI   41 year old female with significant history of alcohol abuse, bipolar, polysubstance abuse on Suboxone, pulmonary embolism on Eliquis presenting requesting for alcohol detox.  Patient states she has a significant history of alcohol use and today she really wants to seek for help with detox.  Patient states she wants to be there for her children.  Her last drink was this morning and states she had approximately 2 shots of liquor.  She usually drinks more than 10 shots of liquor per day.  She is currently on Suboxone.  She is complaining of feeling fidgety and anxious.  She denies having suicidal or homicidal ideation or having auditory or visual hallucination.  She denies feeling depressed.  She denies any other recreational drug use.  She was hospitalized for psychiatric illness recently and discharged 3 days ago.  She did not disclose that she wants alcohol detox at that time.    Past Medical History:  Diagnosis Date  . Abnormal Pap smear   . Alcohol abuse   . Anxiety and depression 01/14/2017  . Bipolar 1 disorder (HCC)   . Depression   . Genital herpes 2000  . Hypertension   . Infection    UTI  . Ovarian cyst   . Schizoaffective disorder (HCC) 05/05/2016  . SVT (supraventricular tachycardia) [facility])     Patient Active Problem List   Diagnosis Date Noted  . Anxiety and depression 01/14/2017  . Pulmonary embolism (HCC) 12/19/2016  . Alcohol withdrawal (HCC) 12/01/2016  . ADD (attention deficit disorder) 05/10/2016  . Head ache 05/08/2016  . Opioid use disorder, mild, in controlled environment (HCC) 05/06/2016  . Essential hypertension 05/06/2016  . Alcohol use disorder,  moderate, in early remission, dependence (HCC) 05/06/2016  . Bipolar I disorder, most recent episode manic, severe with psychotic features (HCC) 01/20/2016  . Chronic pain syndrome   . Morbid obesity due to excess calories [facility])     Past Surgical History:  Procedure Laterality Date  . addenoidectomy    . BREAST ENHANCEMENT SURGERY    . BREAST SURGERY    . KNEE SURGERY    . KNEE SURGERY Left   . TONSILLECTOMY    . WISDOM TOOTH EXTRACTION      OB History    Gravida Para Term Preterm AB Living   5 2 2  0 3 2   SAB TAB Ectopic Multiple Live Births   1 2 0 0 2       Home Medications    Prior to Admission medications   Medication Sig Start Date End Date Taking? Authorizing Provider  atomoxetine (STRATTERA) 80 MG capsule Take 80 mg by mouth daily. 11/06/16   [provider]  benztropine (COGENTIN) 1 MG tablet Take 1 tablet (1 mg total) by mouth at bedtime. For prevention drug related tremors. 05/13/16   Armandina Stammer I, NP  buprenorphine-naloxone (SUBOXONE) 8-2 mg SUBL SL tablet Place 2 tablets under the tongue daily.     [provider]  ELIQUIS STARTER PACK (ELIQUIS STARTER PACK) 5 MG TABS Take as directed on package: start with two-5mg  tablets twice daily for 7 days. On day 8, switch to one-5mg  tablet  twice daily. 12/20/16   Milagros Loll, MD  gabapentin (NEURONTIN) 100 MG capsule Take 200 mg by mouth at bedtime. 12/10/16   [provider]  labetalol (NORMODYNE) 100 MG tablet Take 1 tablet (100 mg) in the morning & 2 tablets (200 mg) in the evening: For high blood pressure 05/13/16   Armandina Stammer I, NP  lithium carbonate 300 MG capsule Take 3 capsules (900 mg total) by mouth at bedtime. For mood stabilization 05/13/16   Nwoko, Nicole Kindred I, NP  montelukast (SINGULAIR) 10 MG tablet Take 10 mg by mouth at bedtime.    [provider]  paliperidone (INVEGA) 9 MG 24 hr tablet Take 1 tablet (9 mg total) by mouth at bedtime. For mood control 05/13/16   Armandina Stammer  I, NP  PARoxetine (PAXIL) 20 MG tablet Take 20 mg by mouth daily. 12/10/16   [provider]  prazosin (MINIPRESS) 2 MG capsule Take 1 capsule (2 mg total) by mouth at bedtime. For nightmares 05/13/16   Armandina Stammer I, NP  QUEtiapine (SEROQUEL) 200 MG tablet Take 200 mg by mouth at bedtime as needed (sleep).  07/03/16   [provider]    Family History Family History  Problem Relation Age of Onset  . Alcoholism Unknown   . Leukemia Unknown   . Cervical cancer Unknown   . Bone cancer Unknown   . Cirrhosis Unknown   . Diabetes Mellitus II Unknown   . Depression Unknown   . Hypertension Unknown   . Thyroid cancer Unknown   . Diabetes Mellitus II Mother   . Hypertension Mother   . Alcoholism Father   . Mental illness Paternal Grandmother     Social History Social History  Substance Use Topics  . Smoking status: Never Smoker  . Smokeless tobacco: Never Used  . Alcohol use 0.0 oz/week     Comment: states has not drank alcohol since 12/19/16     Allergies   Other and Penicillins   Review of Systems Review of Systems  All other systems reviewed and are negative.    Physical Exam Updated Vital Signs BP (!) 154/106 (BP Location: Left Arm)   Pulse (!) 115   Temp 98.7 F (37.1 C) (Oral)   Resp 16   LMP  (LMP Unknown)   SpO2 98%   Physical Exam  Constitutional: She is oriented to person, place, and time. She appears well-developed and well-nourished. No distress.  Obese female appears anxious and uncomfortable  HENT:  Head: Atraumatic.  Eyes: Pupils are equal, round, and reactive to light. Conjunctivae and EOM are normal.  Neck: Neck supple.  Cardiovascular:  Tachycardia without murmur rubs or gallops  Pulmonary/Chest: Effort normal and breath sounds normal.  Abdominal: Soft. Bowel sounds are normal. She exhibits no distension. There is no tenderness.  Neurological: She is alert and oriented to person, place, and time. GCS eye subscore is 4. GCS  verbal subscore is 5. GCS motor subscore is 6.  Tremulous  Skin: No rash noted.  Psychiatric: Her mood appears anxious. Her speech is delayed. She is slowed. Thought content is not paranoid. She expresses no homicidal and no suicidal ideation.  Nursing note and vitals reviewed.    ED Treatments / Results  Labs (all labs ordered are listed, but only abnormal results are displayed) Labs Reviewed  COMPREHENSIVE METABOLIC PANEL - Abnormal; Notable for the following:       Result Value   Potassium 3.2 (*)    Chloride 100 (*)  Glucose, Bld 107 (*)    Total Protein 8.5 (*)    All other components within normal limits  ETHANOL - Abnormal; Notable for the following:    Alcohol, Ethyl (B) 47 (*)    All other components within normal limits  ACETAMINOPHEN LEVEL - Abnormal; Notable for the following:    Acetaminophen (Tylenol), Serum <10 (*)    All other components within normal limits  CBC - Abnormal; Notable for the following:    WBC 12.7 (*)    All other components within normal limits  SALICYLATE LEVEL  RAPID URINE DRUG SCREEN, HOSP PERFORMED    EKG  EKG Interpretation None       Radiology No results found.  Procedures Procedures (including critical care time)  Medications Ordered in ED Medications  gabapentin (NEURONTIN) capsule 300 mg (300 mg Oral Given 01/17/17 2123)  LORazepam (ATIVAN) injection 0-4 mg ( Intravenous See Alternative 01/17/17 2123)    Or  LORazepam (ATIVAN) tablet 0-4 mg (4 mg Oral Given 01/17/17 2123)  LORazepam (ATIVAN) injection 0-4 mg (not administered)    Or  LORazepam (ATIVAN) tablet 0-4 mg (not administered)  thiamine (VITAMIN B-1) tablet 100 mg (100 mg Oral Given 01/17/17 1549)    Or  thiamine (B-1) injection 100 mg ( Intravenous See Alternative 01/17/17 1549)  acetaminophen (TYLENOL) tablet 650 mg (not administered)  alum & mag hydroxide-simeth (MAALOX/MYLANTA) 200-200-20 MG/5ML suspension 30 mL (not administered)  nicotine (NICODERM CQ  - dosed in mg/24 hours) patch 21 mg (21 mg Transdermal Not Given 01/17/17 1550)  montelukast (SINGULAIR) tablet 10 mg (10 mg Oral Given 01/17/17 2123)  paliperidone (INVEGA) 24 hr tablet 9 mg (9 mg Oral Given 01/17/17 2123)  PARoxetine (PAXIL) tablet 20 mg (20 mg Oral Given 01/17/17 1828)  prazosin (MINIPRESS) capsule 2 mg (2 mg Oral Given 01/17/17 2122)  QUEtiapine (SEROQUEL) tablet 200 mg (200 mg Oral Given 01/18/17 0005)  lithium carbonate capsule 450 mg (450 mg Oral Given 01/17/17 1828)  labetalol (NORMODYNE) tablet 100 mg (100 mg Oral Given 01/17/17 1805)  labetalol (NORMODYNE) tablet 200 mg (200 mg Oral Given 01/17/17 2122)  dicyclomine (BENTYL) tablet 20 mg (not administered)  hydrOXYzine (ATARAX/VISTARIL) tablet 25 mg (not administered)  loperamide (IMODIUM) capsule 2-4 mg (not administered)  methocarbamol (ROBAXIN) tablet 500 mg (not administered)  naproxen (NAPROSYN) tablet 500 mg (not administered)  ondansetron (ZOFRAN-ODT) disintegrating tablet 4 mg (not administered)  apixaban (ELIQUIS) tablet 5 mg (5 mg Oral Given 01/17/17 1828)  potassium chloride SA (K-DUR,KLOR-CON) CR tablet 40 mEq (not administered)     Initial Impression / Assessment and Plan / ED Course  I have reviewed the triage vital signs and the nursing notes.  Pertinent labs & imaging results that were available during my care of the patient were reviewed by me and considered in my medical decision making (see chart for details).     BP (!) 144/96 (BP Location: Right Arm)   Pulse (!) 112   Temp 98.5 F (36.9 C) (Oral)   Resp 16   LMP  (LMP Unknown)   SpO2 97%    Final Clinical Impressions(s) / ED Diagnoses   Final diagnoses:  Alcohol abuse  Alcohol withdrawal syndrome without complication (HCC)    New Prescriptions New Prescriptions   No medications on file   3:45 PM Pt here for alcohol detox. Last alcohol intake this AM.  She appears to be in withdrawal, with withdrawals sxs including  tremors, tachycardia, anxiety.  CIWA protocol initiated.  Was SI  but currently denies.  Will consult TTS for further management.   TTS has evaluated pt and recommend monitor to observe overnight for safety.    Fayrene Helperran, Melodye Swor, PA-C 01/18/17 [id number]    Raeford RazorKohut, Stephen, MD 01/21/17 [phone]

## 2017-01-17 NOTE — Patient Outreach (Signed)
[facility] will follow up with the patient in the morning to discuss detox treatment options. Currently, [facility] in [place] has a female detox bed. [facility] and [facility] are at capacity for female detox beds. Patient was recently at [facility] 30 days ago.

## 2017-01-17 NOTE — ED Notes (Signed)
Pt states that her last drink was "a little this morning, but not enough to make a difference." Pt taken to TCU, security already there to wand pt.

## 2017-01-17 NOTE — ED Triage Notes (Signed)
Upon awaking today the patient is seeking to detox from alcohol. Her last drink was this morning. Patient drinks ten shots of liquor a day. Patient states that she does not know if she wants to harm herself. Patient is experiencing tremors. anxiety, and agitation while interviewing.
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
## 2017-01-17 NOTE — [facility] Counselor (Signed)
Nanine MeansJamison Lord, NP recommends monitor and observe overnight for safety. Patient to re-evaluated in the morning

## 2017-01-18 DIAGNOSIS — F515 Nightmare disorder: Secondary | ICD-10-CM | POA: Diagnosis not present

## 2017-01-18 DIAGNOSIS — Z811 Family history of alcohol abuse and dependence: Secondary | ICD-10-CM | POA: Diagnosis not present

## 2017-01-18 DIAGNOSIS — F1014 Alcohol abuse with alcohol-induced mood disorder: Secondary | ICD-10-CM | POA: Diagnosis present

## 2017-01-18 DIAGNOSIS — Z818 Family history of other mental and behavioral disorders: Secondary | ICD-10-CM

## 2017-01-18 DIAGNOSIS — F39 Unspecified mood [affective] disorder: Secondary | ICD-10-CM

## 2017-01-18 DIAGNOSIS — F1910 Other psychoactive substance abuse, uncomplicated: Secondary | ICD-10-CM

## 2017-01-18 DIAGNOSIS — F1110 Opioid abuse, uncomplicated: Secondary | ICD-10-CM | POA: Diagnosis not present

## 2017-01-18 MED ORDER — LITHIUM CARBONATE 300 MG PO CAPS: 900.0000 mg | ORAL_CAPSULE | Freq: Every day | ORAL | 0 refills | Status: DC

## 2017-01-18 MED ORDER — PRAZOSIN HCL 2 MG PO CAPS: 2.0000 mg | ORAL_CAPSULE | Freq: Every day | ORAL | 0 refills | Status: DC

## 2017-01-18 MED ORDER — GABAPENTIN 300 MG PO CAPS: 300.0000 mg | ORAL_CAPSULE | Freq: Three times a day (TID) | ORAL | 0 refills | Status: DC

## 2017-01-18 MED ORDER — PAROXETINE HCL 20 MG PO TABS: 20.0000 mg | ORAL_TABLET | Freq: Every day | ORAL | 0 refills | Status: DC

## 2017-01-18 MED ORDER — PALIPERIDONE ER 9 MG PO TB24: 9.0000 mg | ORAL_TABLET | Freq: Every day | ORAL | 0 refills | Status: DC

## 2017-01-18 MED ORDER — POTASSIUM CHLORIDE CRYS ER 20 MEQ PO TBCR
40.0000 meq | EXTENDED_RELEASE_TABLET | Freq: Once | ORAL | Status: AC
  Administered 2017-01-18: 40 meq via ORAL
  Filled 2017-01-18: qty 2

## 2017-01-18 NOTE — [facility] Suicide Risk Assessment (Signed)
Suicide Risk Assessment  Discharge Assessment   [facility]   Principal Problem: Alcohol abuse with alcohol-induced mood disorder [facility](HCC) Discharge Diagnoses:  Patient Active Problem List   Diagnosis Date Noted  . Alcohol abuse with alcohol-induced mood disorder (HCC) [F10.14] 01/18/2017    Priority: High  . Opioid use disorder, mild, in controlled environment (HCC) [F11.10] 05/06/2016    Priority: High  . Alcohol use disorder, moderate, in early remission, dependence (HCC) [F10.21] 05/06/2016    Priority: High  . Anxiety and depression [F41.9, F32.9] 01/14/2017  . Pulmonary embolism (HCC) [I26.99] 12/19/2016  . Alcohol withdrawal (HCC) [F10.239] 12/01/2016  . ADD (attention deficit disorder) [F98.8] 05/10/2016  . Essential hypertension [I10] 05/06/2016  . Bipolar I disorder, most recent episode manic, severe with psychotic features (HCC) [F31.2] 01/20/2016  . Chronic pain syndrome [G89.4]     Total Time spent with patient: 45 minutes  Musculoskeletal: Strength & Muscle Tone: within normal limits Gait & Station: normal Patient leans: N/A  Psychiatric Specialty Exam: Physical Exam  Constitutional: She is oriented to person, place, and time. She appears well-developed and well-nourished.  HENT:  Head: Normocephalic.  Neck: Normal range of motion.  Respiratory: Effort normal.  Musculoskeletal: Normal range of motion.  Neurological: She is alert and oriented to person, place, and time.  Psychiatric: She has a normal mood and affect. Her speech is normal and behavior is normal. Judgment and thought content normal. Cognition and memory are normal.    Review of Systems  Psychiatric/Behavioral: Positive for substance abuse.  All other systems reviewed and are negative.   Blood pressure 137/85, pulse 95, temperature 98 F (36.7 C), temperature source Oral, resp. rate 18, SpO2 94 %, not currently breastfeeding.There is no height or weight on file to  calculate BMI.  General Appearance: Dishelved  Eye Contact:  Good  Speech:  Normal Rate  Volume:  Normal  Mood:  Euthymic  Affect:  Congruent  Thought Process:  Coherent and Descriptions of Associations: Intact  Orientation:  Full (Time, Place, and Person)  Thought Content:  WDL and Logical  Suicidal Thoughts:  No  Homicidal Thoughts:  No  Memory:  Immediate;   Good Recent;   Good Remote;   Good  Judgement:  Fair  Insight:  Fair  Psychomotor Activity:  Normal  Concentration:  Concentration: Good and Attention Span: Good  Recall:  Good  Fund of Knowledge:  Fair  Language:  Good  Akathisia:  No  Handed:  Right  AIMS (if indicated):     Assets:  Leisure Time Physical Health Resilience  ADL's:  Intact  Cognition:  WNL  Sleep:       Mental Status Per Nursing Assessment::   On Admission:   alcohol detox with passive suicidal ideations  Demographic Factors:  Caucasian  Loss Factors: Legal issues  Historical Factors: NA  Risk Reduction Factors:   Sense of responsibility to family and Positive therapeutic relationship  Continued Clinical Symptoms:  None  Cognitive Features That Contribute To Risk:  None    Suicide Risk:  Minimal: No identifiable suicidal ideation.  Patients presenting with no risk factors but with morbid ruminations; may be classified as minimal risk based on the severity of the depressive symptoms    Plan Of Care/Follow-up recommendations:  Activity:  as tolerated Diet:  heart healthy diet  LORD, JAMISON, NP 01/18/2017, 10:30 AM

## 2017-01-18 NOTE — ED Notes (Signed)
[facility] VERIFIES OUT PT ROOM FOR PT BEFORE DISCHARGE

## 2017-01-18 NOTE — ED Notes (Signed)
[facility] AT BEDSIDE 

## 2017-01-18 NOTE — ED Notes (Signed)
PT GOING TO [facility] TODAY

## 2017-01-18 NOTE — Consult Note (Signed)
[facility] Consult   Reason for Consult:  Alcohol abuse with passive suicidal ideations Referring Physician:  EDP Patient Identification: Megan Edwards MRN:  [id number] Principal Diagnosis: Alcohol abuse with alcohol-induced mood disorder [facility]) Diagnosis:   Patient Active Problem List   Diagnosis Date Noted  . Alcohol abuse with alcohol-induced mood disorder ([place]) [F10.14] 01/18/2017    Priority: High  . Opioid use disorder, mild, in controlled environment ([place]) [F11.10] 05/06/2016    Priority: High  . Alcohol use disorder, moderate, in early remission, dependence ([place]) [F10.21] 05/06/2016    Priority: High  . Anxiety and depression [F41.9, F32.9] 01/14/2017  . Pulmonary embolism ([place]) [I26.99] 12/19/2016  . Alcohol withdrawal ([place]) [F10.239] 12/01/2016  . ADD (attention deficit disorder) [F98.8] 05/10/2016  . Essential hypertension [I10] 05/06/2016  . Bipolar I disorder, most recent episode manic, severe with psychotic features ([place]) [F31.2] 01/20/2016  . Chronic pain syndrome [G89.4]     Total Time spent with patient: 45 minutes  Subjective:   Megan Edwards is a 41 y.o. female patient does not warrant admission.  HPI:  41 yo female who presented to the ED for alcohol detox, stopped taking Suboxone on Monday.  She was started on the Ativan alcohol detox protocol along with the clonidine protocol.  Claiborne Billings met with the Peer Support Consultant and will again today as she wants to continue her care at a rehab.  No suicidal/homicidal ideations, hallucinations, or withdrawal symptoms.  She does have a court date on 11/13 for her third DWI and is encouraged to get help now.  Stable for discharge.  Past Psychiatric History: substance abuse, bipolar disorder  Risk to Self:None Risk to Others: None Prior Inpatient Therapy: Prior Inpatient Therapy: Yes Prior Therapy Dates: 2018, 2017, 2016 Prior Therapy Facilty/Provider(s): [facility], [facility], [facility] Reason for Treatment:  MH issues Prior Outpatient Therapy: Prior Outpatient Therapy: Yes Prior Therapy Dates: last went 2 weeks ago Prior Therapy Facilty/Provider(s): [facility]  Reason for Treatment: MH. SA Does patient have an ACCT team?: No Does patient have Intensive In-House Services?  : No Does patient have [facility] services? : No Does patient have P4CC services?: No  Past Medical History:  Past Medical History:  Diagnosis Date  . Abnormal Pap smear   . Alcohol abuse   . Anxiety and depression 01/14/2017  . Bipolar 1 disorder ([place])   . Depression   . Genital herpes 2000  . Hypertension   . Infection    UTI  . Ovarian cyst   . Schizoaffective disorder ([place]) 05/05/2016  . SVT (supraventricular tachycardia) ([facility])     Past Surgical History:  Procedure Laterality Date  . addenoidectomy    . BREAST ENHANCEMENT SURGERY    . BREAST SURGERY    . KNEE SURGERY    . KNEE SURGERY Left   . TONSILLECTOMY    . WISDOM TOOTH EXTRACTION     Family History:  Family History  Problem Relation Age of Onset  . Alcoholism Unknown   . Leukemia Unknown   . Cervical cancer Unknown   . Bone cancer Unknown   . Cirrhosis Unknown   . Diabetes Mellitus II Unknown   . Depression Unknown   . Hypertension Unknown   . Thyroid cancer Unknown   . Diabetes Mellitus II Mother   . Hypertension Mother   . Alcoholism Father   . Mental illness Paternal Grandmother    Family Psychiatric  History: none Social History:  History  Alcohol Use  . 0.0 oz/week  Comment: states has not drank alcohol since 12/19/16     History  Drug Use No    Comment: former-opiate use    Social History   Social History  . Marital status: Divorced    Spouse name: N/A  . Number of children: N/A  . Years of education: N/A   Occupational History  . [employer]   Social History Main Topics  . Smoking status: Never Smoker  . Smokeless tobacco: Never Used  . Alcohol use 0.0 oz/week     Comment: states has not  drank alcohol since 12/19/16  . Drug use: No     Comment: former-opiate use  . Sexual activity: Not Currently    Birth control/ protection: Abstinence, None   Other Topics Concern  . None   Social History Narrative  . None   Additional Social History:    Allergies:   Allergies  Allergen Reactions  . Other Other (See Comments)    Pt does not take narcotics, is on Suboxone.   Marland Kitchen Penicillins Rash and Other (See Comments)    Has patient had a PCN reaction causing immediate rash, facial/tongue/throat swelling, SOB or lightheadedness with hypotension: Yes Has patient had a PCN reaction causing severe rash involving mucus membranes or skin necrosis: No Has patient had a PCN reaction that required hospitalization No Has patient had a PCN reaction occurring within the last 10 years: No If all of the above answers are "NO", then may proceed with Cephalosporin use.    Labs:  Results for orders placed or performed during the hospital encounter of 01/17/17 (from the past 48 hour(s))  Rapid urine drug screen (hospital performed)     Status: None   Collection Time: 01/17/17  2:57 PM  Result Value Ref Range   Opiates NONE DETECTED NONE DETECTED   Cocaine NONE DETECTED NONE DETECTED   Benzodiazepines NONE DETECTED NONE DETECTED   Amphetamines NONE DETECTED NONE DETECTED   Tetrahydrocannabinol NONE DETECTED NONE DETECTED   Barbiturates NONE DETECTED NONE DETECTED    Comment:        DRUG SCREEN FOR MEDICAL PURPOSES ONLY.  IF CONFIRMATION IS NEEDED FOR ANY PURPOSE, NOTIFY LAB WITHIN 5 DAYS.        LOWEST DETECTABLE LIMITS FOR URINE DRUG SCREEN Drug Class       Cutoff (ng/mL) Amphetamine      1000 Barbiturate      200 Benzodiazepine   941 Tricyclics       740 Opiates          300 Cocaine          300 THC              50   Comprehensive metabolic panel     Status: Abnormal   Collection Time: 01/17/17  3:15 PM  Result Value Ref Range   Sodium 139 135 - 145 mmol/L   Potassium 3.2  (L) 3.5 - 5.1 mmol/L   Chloride 100 (L) 101 - 111 mmol/L   CO2 24 22 - 32 mmol/L   Glucose, Bld 107 (H) 65 - 99 mg/dL   BUN 9 6 - 20 mg/dL   Creatinine, Ser 0.75 0.44 - 1.00 mg/dL   Calcium 9.8 8.9 - 10.3 mg/dL   Total Protein 8.5 (H) 6.5 - 8.1 g/dL   Albumin 4.8 3.5 - 5.0 g/dL   AST 22 15 - 41 U/L   ALT 28 14 - 54 U/L   Alkaline Phosphatase 106 38 - 126  U/L   Total Bilirubin 0.6 0.3 - 1.2 mg/dL   GFR calc non Af Amer >60 >60 mL/min   GFR calc Af Amer >60 >60 mL/min    Comment: (NOTE) The eGFR has been calculated using the CKD EPI equation. This calculation has not been validated in all clinical situations. eGFR's persistently <60 mL/min signify possible Chronic Kidney Disease.    Anion gap 15 5 - 15  Ethanol     Status: Abnormal   Collection Time: 01/17/17  3:15 PM  Result Value Ref Range   Alcohol, Ethyl (B) 47 (H) <10 mg/dL    Comment:        LOWEST DETECTABLE LIMIT FOR SERUM ALCOHOL IS 10 mg/dL FOR MEDICAL PURPOSES ONLY   Salicylate level     Status: None   Collection Time: 01/17/17  3:15 PM  Result Value Ref Range   Salicylate Lvl <4.5 2.8 - 30.0 mg/dL  Acetaminophen level     Status: Abnormal   Collection Time: 01/17/17  3:15 PM  Result Value Ref Range   Acetaminophen (Tylenol), Serum <10 (L) 10 - 30 ug/mL    Comment:        THERAPEUTIC CONCENTRATIONS VARY SIGNIFICANTLY. A RANGE OF 10-30 ug/mL MAY BE AN EFFECTIVE CONCENTRATION FOR MANY PATIENTS. HOWEVER, SOME ARE BEST TREATED AT CONCENTRATIONS OUTSIDE THIS RANGE. ACETAMINOPHEN CONCENTRATIONS >150 ug/mL AT 4 HOURS AFTER INGESTION AND >50 ug/mL AT 12 HOURS AFTER INGESTION ARE OFTEN ASSOCIATED WITH TOXIC REACTIONS.   cbc     Status: Abnormal   Collection Time: 01/17/17  3:15 PM  Result Value Ref Range   WBC 12.7 (H) 4.0 - 10.5 K/uL   RBC 5.05 3.87 - 5.11 MIL/uL   Hemoglobin 14.4 12.0 - 15.0 g/dL   HCT 41.3 36.0 - 46.0 %   MCV 81.8 78.0 - 100.0 fL   MCH 28.5 26.0 - 34.0 pg   MCHC 34.9 30.0 - 36.0 g/dL    RDW 14.6 11.5 - 15.5 %   Platelets 284 150 - 400 K/uL    Current Facility-Administered Medications  Medication Dose Route Frequency Provider Last Rate Last Dose  . acetaminophen (TYLENOL) tablet 650 mg  650 mg Oral Q4H PRN Domenic Moras, PA-C      . alum & mag hydroxide-simeth (MAALOX/MYLANTA) 200-200-20 MG/5ML suspension 30 mL  30 mL Oral Q6H PRN Domenic Moras, PA-C      . apixaban Arne Cleveland) tablet 5 mg  5 mg Oral BID Virgel Manifold, MD   5 mg at 01/17/17 1828  . dicyclomine (BENTYL) tablet 20 mg  20 mg Oral Q6H PRN Patrecia Pour, NP      . gabapentin (NEURONTIN) capsule 300 mg  300 mg Oral TID Patrecia Pour, NP   300 mg at 01/17/17 2123  . hydrOXYzine (ATARAX/VISTARIL) tablet 25 mg  25 mg Oral Q6H PRN Patrecia Pour, NP      . labetalol (NORMODYNE) tablet 100 mg  100 mg Oral Daily Virgel Manifold, MD   100 mg at 01/17/17 1805  . labetalol (NORMODYNE) tablet 200 mg  200 mg Oral QHS Virgel Manifold, MD   200 mg at 01/17/17 2122  . lithium carbonate capsule 450 mg  450 mg Oral BID WC Patrecia Pour, NP   450 mg at 01/18/17 0829  . loperamide (IMODIUM) capsule 2-4 mg  2-4 mg Oral PRN Patrecia Pour, NP      . LORazepam (ATIVAN) injection 0-4 mg  0-4 mg Intravenous Q6H Domenic Moras, PA-C  Or  . LORazepam (ATIVAN) tablet 0-4 mg  0-4 mg Oral Q6H Domenic Moras, PA-C   2 mg at 01/18/17 0640  . [START ON 01/19/2017] LORazepam (ATIVAN) injection 0-4 mg  0-4 mg Intravenous Q12H Domenic Moras, PA-C       Or  . Derrill Memo ON 01/19/2017] LORazepam (ATIVAN) tablet 0-4 mg  0-4 mg Oral Q12H Domenic Moras, PA-C      . methocarbamol (ROBAXIN) tablet 500 mg  500 mg Oral Q8H PRN Patrecia Pour, NP      . montelukast (SINGULAIR) tablet 10 mg  10 mg Oral QHS Domenic Moras, PA-C   10 mg at 01/17/17 2123  . naproxen (NAPROSYN) tablet 500 mg  500 mg Oral BID PRN Patrecia Pour, NP      . nicotine (NICODERM CQ - dosed in mg/24 hours) patch 21 mg  21 mg Transdermal Daily Domenic Moras, PA-C      . ondansetron  (ZOFRAN-ODT) disintegrating tablet 4 mg  4 mg Oral Q6H PRN Patrecia Pour, NP      . paliperidone (INVEGA) 24 hr tablet 9 mg  9 mg Oral QHS Domenic Moras, PA-C   9 mg at 01/17/17 2123  . PARoxetine (PAXIL) tablet 20 mg  20 mg Oral Daily Domenic Moras, PA-C   20 mg at 01/17/17 1828  . prazosin (MINIPRESS) capsule 2 mg  2 mg Oral QHS Domenic Moras, PA-C   2 mg at 01/17/17 2122  . QUEtiapine (SEROQUEL) tablet 200 mg  200 mg Oral QHS PRN Domenic Moras, PA-C   200 mg at 01/18/17 0005  . thiamine (VITAMIN B-1) tablet 100 mg  100 mg Oral Daily Domenic Moras, PA-C   100 mg at 01/17/17 1549   Or  . thiamine (B-1) injection 100 mg  100 mg Intravenous Daily Domenic Moras, PA-C       Current Outpatient Prescriptions  Medication Sig Dispense Refill  . atomoxetine (STRATTERA) 80 MG capsule Take 80 mg by mouth daily.  6  . benztropine (COGENTIN) 1 MG tablet Take 1 tablet (1 mg total) by mouth at bedtime. For prevention drug related tremors. 30 tablet 0  . buprenorphine-naloxone (SUBOXONE) 8-2 mg SUBL SL tablet Place 1 tablet under the tongue daily.     . busPIRone (BUSPAR) 15 MG tablet Take 15 mg by mouth 2 (two) times daily.  0  . ELIQUIS STARTER PACK (ELIQUIS STARTER PACK) 5 MG TABS Take as directed on package: start with two-42m tablets twice daily for 7 days. On day 8, switch to one-512mtablet twice daily. (Patient taking differently: Take 1 tablet by mouth 2 (two) times daily. Take as directed on package: start with two-9m72mablets twice daily for 7 days. On day 8, switch to one-9mg104mblet twice daily.) 1 each 0  . gabapentin (NEURONTIN) 100 MG capsule Take 200 mg by mouth at bedtime.  2  . labetalol (NORMODYNE) 100 MG tablet Take 1 tablet (100 mg) in the morning & 2 tablets (200 mg) in the evening: For high blood pressure 21 tablet 0  . lithium carbonate 300 MG capsule Take 3 capsules (900 mg total) by mouth at bedtime. For mood stabilization 90 capsule 0  . montelukast (SINGULAIR) 10 MG tablet Take 10 mg by mouth at  bedtime.    . paliperidone (INVEGA) 9 MG 24 hr tablet Take 1 tablet (9 mg total) by mouth at bedtime. For mood control 30 tablet 0  . PARoxetine (PAXIL) 20 MG tablet Take 20 mg by mouth  daily.  0  . prazosin (MINIPRESS) 2 MG capsule Take 1 capsule (2 mg total) by mouth at bedtime. For nightmares 30 capsule 0  . promethazine (PHENERGAN) 25 MG tablet TAKE ONE TABLET BY MOUTH ONCE DAILY AS NEEDED FOR NAUSEA AND VOMITTING  2  . QUEtiapine (SEROQUEL) 200 MG tablet Take 200 mg by mouth 2 (two) times daily.   5  . SUBOXONE 8-2 MG FILM PLACE 1 FILM UNDER TONGUE DAILY.  0  . traZODone (DESYREL) 50 MG tablet Take 50 mg by mouth at bedtime.  6    Musculoskeletal: Strength & Muscle Tone: within normal limits Gait & Station: normal Patient leans: N/A  Psychiatric Specialty Exam: Physical Exam  Constitutional: She is oriented to person, place, and time. She appears well-developed and well-nourished.  HENT:  Head: Normocephalic.  Neck: Normal range of motion.  Respiratory: Effort normal.  Musculoskeletal: Normal range of motion.  Neurological: She is alert and oriented to person, place, and time.  Psychiatric: She has a normal mood and affect. Her speech is normal and behavior is normal. Judgment and thought content normal. Cognition and memory are normal.    Review of Systems  Psychiatric/Behavioral: Positive for substance abuse.  All other systems reviewed and are negative.   Blood pressure 137/85, pulse 95, temperature 98 F (36.7 C), temperature source Oral, resp. rate 18, SpO2 94 %, not currently breastfeeding.There is no height or weight on file to calculate BMI.  General Appearance: Dishelved  Eye Contact:  Good  Speech:  Normal Rate  Volume:  Normal  Mood:  Euthymic  Affect:  Congruent  Thought Process:  Coherent and Descriptions of Associations: Intact  Orientation:  Full (Time, Place, and Person)  Thought Content:  WDL and Logical  Suicidal Thoughts:  No  Homicidal Thoughts:  No   Memory:  Immediate;   Good Recent;   Good Remote;   Good  Judgement:  Fair  Insight:  Fair  Psychomotor Activity:  Normal  Concentration:  Concentration: Good and Attention Span: Good  Recall:  Good  Fund of Knowledge:  Fair  Language:  Good  Akathisia:  No  Handed:  Right  AIMS (if indicated):     Assets:  Leisure Time Physical Health Resilience  ADL's:  Intact  Cognition:  WNL  Sleep:        Treatment Plan Summary: Daily contact with patient to assess and evaluate symptoms and progress in treatment, Medication management and Plan alcohol abuse with alcohol induced mood disorder:  -Crisis stabilization -Medication management:  Ativan and clonidine protocol started along with gabapentin 300 mg TID for discharge to prevent any withdrawal issues, restarted medical medications along with Paxil 20 mg daily for depression, Lithium 450 mg BID for mood stabilization, Prazosin 2 mg at bedtime for nightmares, and Invega 9 mg daily for mood.  Discontinued her Seroquel, already on Invega -Individual and substance abuse counseling -Peer Support Consult  Disposition: No evidence of imminent risk to self or others at present.    Waylan Boga, NP 01/18/2017 10:11 AM  Patient seen face to face with staff and agree with notes and plan

## 2017-01-18 NOTE — ED Notes (Addendum)
DELAY IN DISCHARGE PT IS DISCHARGED HOWEVER WAITING ON PEER SUPPORT TO SEE PT. PEER SUPPORT WILL BE ARRIVING TO SEE PATIENTS AT 11AM PER J LORD NP.

## 2017-01-18 NOTE — ED Notes (Signed)
Pt has had ativan and seroquel, unable to awaken enough to safely swallow potassium supplements. Will try again when more alert. Megan Edwards P Haig Gerardo

## 2017-03-20 ENCOUNTER — Telehealth (HOSPITAL_COMMUNITY): Payer: Self-pay

## 2017-03-28 ENCOUNTER — Other Ambulatory Visit: Payer: Self-pay

## 2017-03-28 ENCOUNTER — Emergency Department (HOSPITAL_COMMUNITY)
Admission: EM | Admit: 2017-03-28 | Discharge: 2017-03-29 | Disposition: A | Discharge status: Home / Self Care | Payer: Medicaid Other | Attending: Emergency Medicine | Admitting: Emergency Medicine

## 2017-03-28 ENCOUNTER — Encounter (HOSPITAL_COMMUNITY): Payer: Self-pay | Admitting: Nurse Practitioner

## 2017-03-28 DIAGNOSIS — F22 Delusional disorders: Secondary | ICD-10-CM | POA: Insufficient documentation

## 2017-03-28 DIAGNOSIS — F909 Attention-deficit hyperactivity disorder, unspecified type: Secondary | ICD-10-CM | POA: Insufficient documentation

## 2017-03-28 DIAGNOSIS — R112 Nausea with vomiting, unspecified: Secondary | ICD-10-CM

## 2017-03-28 DIAGNOSIS — N390 Urinary tract infection, site not specified: Secondary | ICD-10-CM | POA: Insufficient documentation

## 2017-03-28 DIAGNOSIS — R197 Diarrhea, unspecified: Secondary | ICD-10-CM

## 2017-03-28 DIAGNOSIS — F4329 Adjustment disorder with other symptoms: Secondary | ICD-10-CM | POA: Insufficient documentation

## 2017-03-28 DIAGNOSIS — F419 Anxiety disorder, unspecified: Secondary | ICD-10-CM | POA: Insufficient documentation

## 2017-03-28 DIAGNOSIS — Z79899 Other long term (current) drug therapy: Secondary | ICD-10-CM | POA: Insufficient documentation

## 2017-03-28 DIAGNOSIS — I10 Essential (primary) hypertension: Secondary | ICD-10-CM | POA: Insufficient documentation

## 2017-03-28 DIAGNOSIS — R1084 Generalized abdominal pain: Secondary | ICD-10-CM

## 2017-03-28 DIAGNOSIS — Z008 Encounter for other general examination: Secondary | ICD-10-CM | POA: Insufficient documentation

## 2017-03-28 LAB — COMPREHENSIVE METABOLIC PANEL
ALK PHOS: 115 U/L (ref 38–126)
ALT: 21 U/L (ref 14–54)
ANION GAP: 9 (ref 5–15)
AST: 21 U/L (ref 15–41)
Albumin: 5 g/dL (ref 3.5–5.0)
BILIRUBIN TOTAL: 0.6 mg/dL (ref 0.3–1.2)
BUN: 12 mg/dL (ref 6–20)
CALCIUM: 9.7 mg/dL (ref 8.9–10.3)
CO2: 26 mmol/L (ref 22–32)
Chloride: 103 mmol/L (ref 101–111)
Creatinine, Ser: 0.92 mg/dL (ref 0.44–1.00)
GFR calc non Af Amer: >60 mL/min (ref >60)
Glucose, Bld: 93 mg/dL (ref 65–99)
POTASSIUM: 3.5 mmol/L (ref 3.5–5.1)
SODIUM: 138 mmol/L (ref 135–145)
TOTAL PROTEIN: 8.9 g/dL — AB (ref 6.5–8.1)

## 2017-03-28 LAB — CBC WITH DIFFERENTIAL/PLATELET
Basophils Absolute: 0 10*3/uL (ref 0.0–0.1)
Basophils Relative: 0 %
EOS ABS: 0.1 10*3/uL (ref 0.0–0.7)
Eosinophils Relative: 1 %
HCT: 43.5 % (ref 36.0–46.0)
HEMOGLOBIN: 14.8 g/dL (ref 12.0–15.0)
LYMPHS ABS: 2.3 10*3/uL (ref 0.7–4.0)
Lymphocytes Relative: 23 %
MCH: 28.5 pg (ref 26.0–34.0)
MCHC: 34 g/dL (ref 30.0–36.0)
MCV: 83.8 fL (ref 78.0–100.0)
MONO ABS: 0.4 10*3/uL (ref 0.1–1.0)
MONOS PCT: 4 %
NEUTROS PCT: 72 %
Neutro Abs: 7.4 10*3/uL (ref 1.7–7.7)
Platelets: 378 10*3/uL (ref 150–400)
RBC: 5.19 MIL/uL — ABNORMAL HIGH (ref 3.87–5.11)
RDW: 13.8 % (ref 11.5–15.5)
WBC: 10.2 10*3/uL (ref 4.0–10.5)

## 2017-03-28 LAB — ACETAMINOPHEN LEVEL

## 2017-03-28 LAB — RAPID URINE DRUG SCREEN, HOSP PERFORMED
Amphetamines: NOT DETECTED
Barbiturates: NOT DETECTED
Benzodiazepines: NOT DETECTED
Cocaine: NOT DETECTED
Opiates: NOT DETECTED
Tetrahydrocannabinol: NOT DETECTED

## 2017-03-28 LAB — URINALYSIS, ROUTINE W REFLEX MICROSCOPIC
BILIRUBIN URINE: NEGATIVE
GLUCOSE, UA: NEGATIVE mg/dL
KETONES UR: NEGATIVE mg/dL
NITRITE: NEGATIVE
PH: 5 (ref 5.0–8.0)
Protein, ur: 100 mg/dL — AB
Specific Gravity, Urine: 1.016 (ref 1.005–1.030)

## 2017-03-28 LAB — I-STAT BETA HCG BLOOD, ED (MC, WL, AP ONLY): I-stat hCG, quantitative: <5 m[IU]/mL (ref <5)

## 2017-03-28 LAB — LIPASE, BLOOD: Lipase: 25 U/L (ref 11–51)

## 2017-03-28 LAB — ETHANOL: Alcohol, Ethyl (B): <10 mg/dL (ref <10)

## 2017-03-28 LAB — SALICYLATE LEVEL

## 2017-03-28 MED ORDER — LOPERAMIDE HCL 2 MG PO CAPS
2.0000 mg | ORAL_CAPSULE | ORAL | Status: DC | PRN
  Administered 2017-03-29: 2 mg via ORAL
  Filled 2017-03-28: qty 1

## 2017-03-28 MED ORDER — DICYCLOMINE HCL 20 MG PO TABS
20.0000 mg | ORAL_TABLET | Freq: Four times a day (QID) | ORAL | Status: DC | PRN
  Administered 2017-03-29: 20 mg via ORAL
  Filled 2017-03-28: qty 1

## 2017-03-28 MED ORDER — TRAZODONE HCL 100 MG PO TABS
200.0000 mg | ORAL_TABLET | Freq: Every day | ORAL | Status: DC
  Administered 2017-03-28: 200 mg via ORAL
  Filled 2017-03-28: qty 2

## 2017-03-28 MED ORDER — RIVAROXABAN 20 MG PO TABS
20.0000 mg | ORAL_TABLET | Freq: Every day | ORAL | Status: DC
  Administered 2017-03-28: 20 mg via ORAL
  Filled 2017-03-28 (×2): qty 1

## 2017-03-28 MED ORDER — CLONIDINE HCL 0.1 MG PO TABS: 0.1000 mg | ORAL_TABLET | Freq: Every day | ORAL | Status: DC

## 2017-03-28 MED ORDER — LAMOTRIGINE 25 MG PO TABS
25.0000 mg | ORAL_TABLET | Freq: Every day | ORAL | Status: DC
  Administered 2017-03-28 – 2017-03-29 (×2): 25 mg via ORAL
  Filled 2017-03-28 (×2): qty 1

## 2017-03-28 MED ORDER — ATOMOXETINE HCL 40 MG PO CAPS
80.0000 mg | ORAL_CAPSULE | Freq: Every day | ORAL | Status: DC
  Administered 2017-03-28 – 2017-03-29 (×2): 80 mg via ORAL
  Filled 2017-03-28 (×2): qty 2

## 2017-03-28 MED ORDER — CLONIDINE HCL 0.1 MG PO TABS
0.1000 mg | ORAL_TABLET | Freq: Four times a day (QID) | ORAL | Status: DC
  Administered 2017-03-28 – 2017-03-29 (×3): 0.1 mg via ORAL
  Filled 2017-03-28 (×3): qty 1

## 2017-03-28 MED ORDER — ALUM & MAG HYDROXIDE-SIMETH 200-200-20 MG/5ML PO SUSP: 30.0000 mL | Freq: Four times a day (QID) | ORAL | Status: DC | PRN

## 2017-03-28 MED ORDER — LITHIUM CARBONATE 300 MG PO CAPS
900.0000 mg | ORAL_CAPSULE | Freq: Every day | ORAL | Status: DC
  Administered 2017-03-28: 900 mg via ORAL
  Filled 2017-03-28: qty 3

## 2017-03-28 MED ORDER — CLONIDINE HCL 0.1 MG PO TABS: 0.1000 mg | ORAL_TABLET | ORAL | Status: DC

## 2017-03-28 MED ORDER — LABETALOL HCL 100 MG PO TABS: 100.0000 mg | ORAL_TABLET | Freq: Two times a day (BID) | ORAL | Status: DC

## 2017-03-28 MED ORDER — PAROXETINE HCL 20 MG PO TABS
20.0000 mg | ORAL_TABLET | Freq: Every day | ORAL | Status: DC
  Administered 2017-03-28 – 2017-03-29 (×2): 20 mg via ORAL
  Filled 2017-03-28 (×2): qty 1

## 2017-03-28 MED ORDER — METHOCARBAMOL 500 MG PO TABS: 500.0000 mg | ORAL_TABLET | Freq: Three times a day (TID) | ORAL | Status: DC | PRN

## 2017-03-28 MED ORDER — FOSFOMYCIN TROMETHAMINE 3 G PO PACK
3.0000 g | PACK | Freq: Once | ORAL | Status: AC
  Administered 2017-03-28: 3 g via ORAL
  Filled 2017-03-28: qty 3

## 2017-03-28 MED ORDER — NAPROXEN 500 MG PO TABS: 500.0000 mg | ORAL_TABLET | Freq: Two times a day (BID) | ORAL | Status: DC | PRN

## 2017-03-28 MED ORDER — HYDROXYZINE HCL 25 MG PO TABS
25.0000 mg | ORAL_TABLET | Freq: Four times a day (QID) | ORAL | Status: DC | PRN
  Administered 2017-03-28 – 2017-03-29 (×2): 25 mg via ORAL
  Filled 2017-03-28 (×3): qty 1

## 2017-03-28 MED ORDER — ONDANSETRON 4 MG PO TBDP
4.0000 mg | ORAL_TABLET | Freq: Four times a day (QID) | ORAL | Status: DC | PRN
  Administered 2017-03-29: 4 mg via ORAL
  Filled 2017-03-28: qty 1

## 2017-03-28 MED ORDER — ONDANSETRON HCL 4 MG/2ML IJ SOLN
4.0000 mg | Freq: Once | INTRAMUSCULAR | Status: AC
  Administered 2017-03-28: 4 mg via INTRAVENOUS
  Filled 2017-03-28: qty 2

## 2017-03-28 MED ORDER — LABETALOL HCL 200 MG PO TABS
200.0000 mg | ORAL_TABLET | Freq: Every day | ORAL | Status: DC
  Administered 2017-03-28: 200 mg via ORAL
  Filled 2017-03-28 (×2): qty 1

## 2017-03-28 MED ORDER — ACETAMINOPHEN 325 MG PO TABS: 650.0000 mg | ORAL_TABLET | ORAL | Status: DC | PRN

## 2017-03-28 MED ORDER — BUSPIRONE HCL 10 MG PO TABS
15.0000 mg | ORAL_TABLET | Freq: Two times a day (BID) | ORAL | Status: DC
  Administered 2017-03-28 – 2017-03-29 (×2): 15 mg via ORAL
  Filled 2017-03-28 (×2): qty 2

## 2017-03-28 MED ORDER — QUETIAPINE FUMARATE 300 MG PO TABS
300.0000 mg | ORAL_TABLET | Freq: Two times a day (BID) | ORAL | Status: DC
  Administered 2017-03-28 – 2017-03-29 (×2): 300 mg via ORAL
  Filled 2017-03-28 (×2): qty 1

## 2017-03-28 MED ORDER — ONDANSETRON 8 MG PO TBDP: 8.0000 mg | ORAL_TABLET | Freq: Once | ORAL | Status: DC

## 2017-03-28 MED ORDER — LABETALOL HCL 100 MG PO TABS
100.0000 mg | ORAL_TABLET | Freq: Every day | ORAL | Status: DC
  Administered 2017-03-29: 100 mg via ORAL
  Filled 2017-03-28: qty 1

## 2017-03-28 MED ORDER — SODIUM CHLORIDE 0.9 % IV BOLUS (SEPSIS)
1000.0000 mL | Freq: Once | INTRAVENOUS | Status: AC
  Administered 2017-03-28: 1000 mL via INTRAVENOUS

## 2017-03-28 MED ORDER — ONDANSETRON HCL 4 MG PO TABS: 4.0000 mg | ORAL_TABLET | Freq: Three times a day (TID) | ORAL | Status: DC | PRN

## 2017-03-28 NOTE — ED Notes (Signed)
Patient seen in bed awake. Complained of tiredness as a result of throwing up several times and diarrhea. Denies pain, SI/HI, AH/VH at time. Requested for ginger ale and snacks. Comfort and support provided. Routine safety checks maintained. Will continue to monitor patient.

## 2017-03-28 NOTE — Patient Outreach (Signed)
[facility] will follow up with the patient in the morning to provide substance use recovery support if the patient is not discharged before 11:00 am. Patient has been doing well with her personal recovery and reported that she has been attending 12-step meetings regularly.  [facility] will continue to follow up with the patient after discharge to provide substance use recovery support.

## 2017-03-28 NOTE — BH Assessment (Signed)
[facility] Assessment Progress Note Case was staffed with Shaune PollackLord DNP who recommended patient be monitored for safety and medication/s evaluated. Patient will be seen by psychiatry in the a.m.

## 2017-03-28 NOTE — ED Triage Notes (Signed)
Patient states she has become clean from drugs and suboxan and now she feels more sick than she ever has. Patient states there are people that got paid if she was no longer living and is paranoid about that.

## 2017-03-28 NOTE — ED Notes (Signed)
Bed: [id number] Expected date:  Expected time:  Means of arrival:  Comments: Psych eval

## 2017-03-28 NOTE — ED Provider Notes (Signed)
[facility] Note   CSN: [id number] Arrival date & time: 03/28/17  1224     History   Chief Complaint No chief complaint on file.   HPI Megan Edwards is a 42 y.o. female with a PMHx of bipolar disorder, HTN, alcoholism, schizoaffective disorder, and other conditions listed below, who presents to the ED with complaints of requesting full evaluation.  Level 5 caveat due to patient being a poor historian, some of the history is obtained after speaking to her counselor Vivi Martens.  Patient states that she was sent here because she has been having nausea and vomiting with her medications ever since leaving her [facility] detox facility 8 weeks ago, and thinks that she may have something else in her system and was told to come here to get "a full blood panel" to see what medications are in her system and what is causing her problems.  She states that while she was in detox she was doing well, but after coming out of detox she has been having 3-4 episodes daily of nonbloody nonbilious emesis and nausea, as well as abdominal pain that she states feels like "knots in her stomach".  She also reports for daily episodes of nonbloody watery diarrhea, as well as dysuria.  She has been compliant with her medications, including BuSpar 15 mg twice daily, Seroquel 600 mg at bedtime, trazodone 200 mg at bedtime, Lamictal 50 mg daily, Paxil 30 mg daily, Strattera 80 mg daily, lithium 900 mg at bedtime, as well as Xarelto, Phenergan, Singulair, and labetalol.  She denies SI, HI, AVH, drug use, alcohol use, or tobacco use.  She is convinced that her baby's father is giving her something that she should not be getting.  After speaking with her counselor Amada Jupiter, he states that the patient became very paranoid about her baby's father giving her medications, and that today "she got set off" because her mother had her leave in this upset the patient.  He states that he was unsure  whether she may be having hallucinations, and after talking with the patient and her brother, they agreed that it would be best for her to come in for psychiatric evaluation.  He feels as though she has been very paranoid recently and is concerned about this.  Patient is here voluntarily.  She denies fevers, chills, CP, SOB, constipation, obstipation, melena, hematochezia, hematemesis, hematuria, vaginal bleeding/discharge, numbness, tingling, focal weakness, or any other complaints at this time. Denies recent travel, sick contacts, suspicious food intake, EtOH use, NSAID use, or recent abx.    The history is provided by the patient and medical records. No language interpreter was used.  Mental Health Problem  Presenting symptoms: paranoid behavior   Presenting symptoms: no hallucinations, no homicidal ideas and no suicidal thoughts   Onset quality:  Gradual Duration:  8 weeks Timing:  Constant Progression:  Worsening Chronicity:  Recurrent Context: stressful life event   Treatment compliance:  All of the time Relieved by:  None tried Worsened by:  Family interactions Ineffective treatments:  None tried Associated symptoms: abdominal pain   Associated symptoms: no chest pain   Risk factors: hx of mental illness and recent psychiatric admission     Past Medical History:  Diagnosis Date  . Abnormal Pap smear   . Alcohol abuse   . Anxiety and depression 01/14/2017  . Bipolar 1 disorder (HCC)   . Depression   . Genital herpes 2000  . Hypertension   . Infection  UTI  . Ovarian cyst   . Schizoaffective disorder (HCC) 05/05/2016  . SVT (supraventricular tachycardia) [facility])     Patient Active Problem List   Diagnosis Date Noted  . Alcohol abuse with alcohol-induced mood disorder (HCC) 01/18/2017  . Anxiety and depression 01/14/2017  . Pulmonary embolism (HCC) 12/19/2016  . Alcohol withdrawal (HCC) 12/01/2016  . ADD (attention deficit disorder) 05/10/2016  . Opioid use disorder,  mild, in controlled environment (HCC) 05/06/2016  . Essential hypertension 05/06/2016  . Alcohol use disorder, moderate, in early remission, dependence (HCC) 05/06/2016  . Bipolar I disorder, most recent episode manic, severe with psychotic features (HCC) 01/20/2016  . Chronic pain syndrome     Past Surgical History:  Procedure Laterality Date  . addenoidectomy    . BREAST ENHANCEMENT SURGERY    . BREAST SURGERY    . KNEE SURGERY    . KNEE SURGERY Left   . TONSILLECTOMY    . WISDOM TOOTH EXTRACTION      OB History    Gravida Para Term Preterm AB Living   5 2 2  0 3 2   SAB TAB Ectopic Multiple Live Births   1 2 0 0 2       Home Medications    Prior to Admission medications   Medication Sig Start Date End Date Taking? Authorizing Provider  atomoxetine (STRATTERA) 80 MG capsule Take 80 mg by mouth daily. 01/06/17   [provider]  benztropine (COGENTIN) 1 MG tablet Take 1 tablet (1 mg total) by mouth at bedtime. For prevention drug related tremors. 05/13/16   Armandina Stammer I, NP  buprenorphine-naloxone (SUBOXONE) 8-2 mg SUBL SL tablet Place 1 tablet under the tongue daily.     [provider]  busPIRone (BUSPAR) 15 MG tablet Take 15 mg by mouth 2 (two) times daily. 12/23/16   [provider]  ELIQUIS STARTER PACK (ELIQUIS STARTER PACK) 5 MG TABS Take as directed on package: start with two-5mg  tablets twice daily for 7 days. On day 8, switch to one-5mg  tablet twice daily. Patient taking differently: Take 1 tablet by mouth 2 (two) times daily. Take as directed on package: start with two-5mg  tablets twice daily for 7 days. On day 8, switch to one-5mg  tablet twice daily. 12/20/16   Milagros Loll, MD  gabapentin (NEURONTIN) 100 MG capsule Take 200 mg by mouth at bedtime. 12/10/16   [provider]  gabapentin (NEURONTIN) 300 MG capsule Take 1 capsule (300 mg total) by mouth 3 (three) times daily. 01/18/17   Charm Rings, NP  labetalol (NORMODYNE) 100  MG tablet Take 1 tablet (100 mg) in the morning & 2 tablets (200 mg) in the evening: For high blood pressure 05/13/16   Armandina Stammer I, NP  lithium carbonate 300 MG capsule Take 3 capsules (900 mg total) by mouth at bedtime. For mood stabilization 01/18/17   Charm Rings, NP  montelukast (SINGULAIR) 10 MG tablet Take 10 mg by mouth at bedtime.    [provider]  paliperidone (INVEGA) 9 MG 24 hr tablet Take 1 tablet (9 mg total) by mouth at bedtime. For mood control 01/18/17   Charm Rings, NP  PARoxetine (PAXIL) 20 MG tablet Take 1 tablet (20 mg total) by mouth daily. 01/18/17   Charm Rings, NP  prazosin (MINIPRESS) 2 MG capsule Take 1 capsule (2 mg total) by mouth at bedtime. For nightmares 01/18/17   Charm Rings, NP  promethazine (PHENERGAN) 25 MG tablet TAKE ONE  TABLET BY MOUTH ONCE DAILY AS NEEDED FOR NAUSEA AND VOMITTING 01/06/17   [provider]  QUEtiapine (SEROQUEL) 200 MG tablet Take 200 mg by mouth 2 (two) times daily.  07/03/16   [provider]  SUBOXONE 8-2 MG FILM PLACE 1 FILM UNDER TONGUE DAILY. 01/14/17   [provider]  traZODone (DESYREL) 50 MG tablet Take 50 mg by mouth at bedtime. 12/31/16   [provider]    Family History Family History  Problem Relation Age of Onset  . Alcoholism Unknown   . Leukemia Unknown   . Cervical cancer Unknown   . Bone cancer Unknown   . Cirrhosis Unknown   . Diabetes Mellitus II Unknown   . Depression Unknown   . Hypertension Unknown   . Thyroid cancer Unknown   . Diabetes Mellitus II Mother   . Hypertension Mother   . Alcoholism Father   . Mental illness Paternal Grandmother     Social History Social History   Tobacco Use  . Smoking status: Never Smoker  . Smokeless tobacco: Never Used  Substance Use Topics  . Alcohol use: Yes    Alcohol/week: 0.0 oz    Comment: states has not drank alcohol since 12/19/16  . Drug use: Yes    Types: Methamphetamines    Comment:  former-opiate use     Allergies   Other and Penicillins   Review of Systems Review of Systems  Constitutional: Negative for chills and fever.  Respiratory: Negative for shortness of breath.   Cardiovascular: Negative for chest pain.  Gastrointestinal: Positive for abdominal pain, diarrhea, nausea and vomiting. Negative for blood in stool and constipation.  Genitourinary: Positive for dysuria. Negative for hematuria, vaginal bleeding and vaginal discharge.  Musculoskeletal: Negative for arthralgias and myalgias.  Skin: Negative for color change.  Allergic/Immunologic: Negative for immunocompromised state.  Neurological: Negative for weakness and numbness.  Psychiatric/Behavioral: Positive for paranoia. Negative for confusion, hallucinations, homicidal ideas and suicidal ideas.   All other systems reviewed and are negative for acute change except as noted in the HPI.    Physical Exam Updated Vital Signs BP (!) 149/106 (BP Location: Right Arm)   Pulse 86   Temp 99.6 F (37.6 C) (Oral)   Resp (!) 22   Ht 5\' 7"  (1.702 m)   Wt 90.7 kg (200 lb)   SpO2 97%   BMI 31.32 kg/m   Physical Exam  Constitutional: She is oriented to person, place, and time. Vital signs are normal. She appears well-developed and well-nourished.  Non-toxic appearance. No distress.  Afebrile, nontoxic, NAD except for being very paranoid and anxious appearing  HENT:  Head: Normocephalic and atraumatic.  Mouth/Throat: Oropharynx is clear and moist and mucous membranes are normal.  Eyes: Conjunctivae and EOM are normal. Right eye exhibits no discharge. Left eye exhibits no discharge.  Neck: Normal range of motion. Neck supple.  Cardiovascular: Normal rate, regular rhythm, normal heart sounds and intact distal pulses. Exam reveals no gallop and no friction rub.  No murmur heard. Pulmonary/Chest: Effort normal and breath sounds normal. No respiratory distress. She has no decreased breath sounds. She has no  wheezes. She has no rhonchi. She has no rales.  Abdominal: Soft. Normal appearance and bowel sounds are normal. She exhibits no distension. There is no tenderness. There is no rigidity, no rebound, no guarding, no CVA tenderness, no tenderness at McBurney's point and negative Murphy's sign.  Soft, nondistended, +BS throughout; complains of generalized abd TTP but this seems to  disappear when pt is distracted; no r/g/r, neg murphy's, neg mcburney's, no CVA TTP   Musculoskeletal: Normal range of motion.  Neurological: She is alert and oriented to person, place, and time. She has normal strength. No sensory deficit.  Skin: Skin is warm, dry and intact. No rash noted.  Psychiatric: Her mood appears anxious. Her speech is rapid and/or pressured. She is not actively hallucinating. Thought content is paranoid. She expresses no homicidal and no suicidal ideation. She expresses no suicidal plans and no homicidal plans.  Anxious affect, paranoid thought content, somewhat rapid and pressured speech. Denies SI, HI, or AVH, doesn't seem to be responding to internal stimuli.    Nursing note and vitals reviewed.    ED Treatments / Results  Labs (all labs ordered are listed, but only abnormal results are displayed) Labs Reviewed  CBC WITH DIFFERENTIAL/PLATELET - Abnormal; Notable for the following components:      Result Value   RBC 5.19 (*)    All other components within normal limits  COMPREHENSIVE METABOLIC PANEL - Abnormal; Notable for the following components:   Total Protein 8.9 (*)    All other components within normal limits  ACETAMINOPHEN LEVEL - Abnormal; Notable for the following components:   Acetaminophen (Tylenol), Serum <10 (*)    All other components within normal limits  URINALYSIS, ROUTINE W REFLEX MICROSCOPIC - Abnormal; Notable for the following components:   APPearance TURBID (*)    Hgb urine dipstick SMALL (*)    Protein, ur 100 (*)    Leukocytes, UA LARGE (*)    Bacteria, UA  MANY (*)    Squamous Epithelial / LPF TOO NUMEROUS TO COUNT (*)    All other components within normal limits  ETHANOL  SALICYLATE LEVEL  RAPID URINE DRUG SCREEN, HOSP PERFORMED  LIPASE, BLOOD  I-STAT BETA HCG BLOOD, ED ([facility], [facility], [facility] ONLY)    EKG  EKG Interpretation None       Radiology No results found.  Procedures Procedures (including critical care time)  Medications Ordered in ED Medications  fosfomycin (MONUROL) packet 3 g (not administered)  atomoxetine (STRATTERA) capsule 80 mg (not administered)  busPIRone (BUSPAR) tablet 15 mg (not administered)  labetalol (NORMODYNE) tablet 100-200 mg (not administered)  lamoTRIgine (LAMICTAL) tablet 25 mg (not administered)  PARoxetine (PAXIL) tablet 20 mg (not administered)  QUEtiapine (SEROQUEL) tablet 300 mg (not administered)  traZODone (DESYREL) tablet 200 mg (not administered)  rivaroxaban (XARELTO) tablet 20 mg (not administered)  lithium carbonate capsule 900 mg (not administered)  acetaminophen (TYLENOL) tablet 650 mg (not administered)  ondansetron (ZOFRAN) tablet 4 mg (not administered)  alum & mag hydroxide-simeth (MAALOX/MYLANTA) 200-200-20 MG/5ML suspension 30 mL (not administered)  sodium chloride 0.9 % bolus 1,000 mL (1,000 mLs Intravenous New Bag/Given 03/28/17 1422)  ondansetron (ZOFRAN) injection 4 mg (4 mg Intravenous Given 03/28/17 1442)     Initial Impression / Assessment and Plan / ED Course  I have reviewed the triage vital signs and the nursing notes.  Pertinent labs & imaging results that were available during my care of the patient were reviewed by me and considered in my medical decision making (see chart for details).     42 y.o. female here with paranoid behavior. Pt very difficult historian therefore some of the history provided by her counselor Vivi Martensale Slaughter [phone]). Pt stated she was here to get a full work up to see what meds are in her system because she's having abd pain/n/v/d ever  since coming back from  detox 8wks ago, and is convinced her baby's father is giving her meds she's not supposed to get, or that her meds are causing her symptoms. Pt very paranoid and anxious appearing, she complains of diffuse abd tenderness to palpation however this disappears when pt is distracted. Per her counselor, she became very paranoid and was possibly hallucinating today which is why he sent her here. Pt requesting IV fluids because she thinks she's dehydrated. Will get labs and give fluids/zofran, then reassess shortly; doubt need for imaging at this time. Will reassess after work up completed.   3:52 PM CBC w/diff WNL. CMP WNL. Lipase WNL. U/A grossly contaminated with TNTC squamous, but given pt's symptoms, will treat for UTI with fosfomycin. EtOH level undetectable. Salicylate and acetaminophen levels WNL. BetaHCG neg. UDS neg. Pt feeling better after fluids and zofran, tolerating PO well. It does not appear that an emergent medical condition remains at this time, her symptoms could be related to viral illness vs medical side effects, vs somatic complaints related to psychiatric condition. At this time, will request TTS consultation to assist with med management and paranoid thoughts and possible hallucinations. Pt medically cleared at this time. Psych hold orders and home med orders placed. Please see TTS notes for further documentation of care/dispo. Pt stable at time of med clearance.     Final Clinical Impressions(s) / ED Diagnoses   Final diagnoses:  Paranoid behavior (HCC)  Medical clearance for psychiatric admission  Nausea vomiting and diarrhea  Generalized abdominal pain  Lower urinary tract infection  Anxiety    ED Discharge Orders    [address], [place], [place] 03/28/17 1557    Arby Barrette, MD 03/28/17 [phone]
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
## 2017-03-28 NOTE — ED Notes (Signed)
Pt admitted to room #43. When asked what brought her to the hospital pt reports "It's complicated." Pt endorsing passive SI. Denies HI/AVH. Anxiety noted. Encouragement and support provided. Special checks q 15 mins in place for safety, Video monitoring in place. Will continue to monitor.

## 2017-03-29 DIAGNOSIS — R450 Nervousness: Secondary | ICD-10-CM | POA: Diagnosis not present

## 2017-03-29 DIAGNOSIS — R4587 Impulsiveness: Secondary | ICD-10-CM

## 2017-03-29 DIAGNOSIS — Z818 Family history of other mental and behavioral disorders: Secondary | ICD-10-CM | POA: Diagnosis not present

## 2017-03-29 DIAGNOSIS — F419 Anxiety disorder, unspecified: Secondary | ICD-10-CM | POA: Diagnosis not present

## 2017-03-29 DIAGNOSIS — Z811 Family history of alcohol abuse and dependence: Secondary | ICD-10-CM

## 2017-03-29 DIAGNOSIS — F4329 Adjustment disorder with other symptoms: Secondary | ICD-10-CM

## 2017-03-29 LAB — LITHIUM LEVEL: LITHIUM LVL: 0.3 mmol/L — AB (ref 0.60–1.20)

## 2017-03-29 LAB — URINALYSIS, ROUTINE W REFLEX MICROSCOPIC
Bilirubin Urine: NEGATIVE
GLUCOSE, UA: NEGATIVE mg/dL
Ketones, ur: NEGATIVE mg/dL
Leukocytes, UA: NEGATIVE
Nitrite: NEGATIVE
PH: 7 (ref 5.0–8.0)
Protein, ur: NEGATIVE mg/dL
SPECIFIC GRAVITY, URINE: 1.002 — AB (ref 1.005–1.030)

## 2017-03-29 NOTE — Consult Note (Signed)
[facility] Consult   Reason for Consult:  Passive suicidal ideation Referring Physician:  EDP Patient Identification: Megan Edwards MRN:  [id number] Principal Diagnosis: Adjustment disorder with disturbance of emotion Diagnosis:   Patient Active Problem List   Diagnosis Date Noted  . Adjustment disorder with disturbance of emotion [F43.29] 03/29/2017  . Alcohol abuse with alcohol-induced mood disorder ([place]) [F10.14] 01/18/2017  . Anxiety and depression [F41.9, F32.9] 01/14/2017  . Pulmonary embolism ([place]) [I26.99] 12/19/2016  . Alcohol withdrawal ([place]) [F10.239] 12/01/2016  . ADD (attention deficit disorder) [F98.8] 05/10/2016  . Opioid use disorder, mild, in controlled environment ([place]) [F11.10] 05/06/2016  . Essential hypertension [I10] 05/06/2016  . Alcohol use disorder, moderate, in early remission, dependence ([place]) [F10.21] 05/06/2016  . Bipolar I disorder, most recent episode manic, severe with psychotic features ([place]) [F31.2] 01/20/2016  . Chronic pain syndrome [G89.4]     Total Time spent with patient: 45 minutes  Subjective:   Megan Edwards is a 42 y.o. female patient admitted with passive suicidal ideation in relation to somatic complaints she believes are associated with recent medication changes.   HPI:  Pt was seen and chart reviewed with treatment team and Dr Megan Edwards.Pt is [facility] (Odem MD). Pt stated she completes a treatment program with [facility] in Nov 2018. Pt stated she was discharged with one months medications, which included Suboxone. According to controlled substance registry Pt's last prescription for Suboxone was filled on 01/14/17 for a 7 day supply. Upon d/c from [facility] Pt followed up with [facility] where several medication changes were made and she states that she feels "like crap' ever since. Pt has somatic complaints of N/V/D. Pt's UDS and BAL are negative. Pt sees a Social worker at [employer] and attends [employer]. Pt states she has maintained her sobriety for 8 weeks. Pt is psychiatrically clear for discharge.    Past Psychiatric History: As above  Risk to Self: None Risk to Others: None Prior Inpatient Therapy: Prior Inpatient Therapy: Yes Prior Therapy Dates: 2018 Prior Therapy Facilty/Provider(s): Megan Edwards - [facility] Reason for Treatment: SA issues MH issues Prior Outpatient Therapy: Prior Outpatient Therapy: Yes Prior Therapy Dates: 2018 Prior Therapy Facilty/Provider(s): Megan Edwards/Megan Edwards([facility]) Reason for Treatment: Med mang Does patient have an ACCT team?: No Does patient have Intensive In-House Services?  : No Does patient have [facility] services? : No Does patient have P4CC services?: No  Past Medical History:  Past Medical History:  Diagnosis Date  . Abnormal Pap smear   . Alcohol abuse   . Anxiety and depression 01/14/2017  . Bipolar 1 disorder ([place])   . Depression   . Genital herpes 2000  . Hypertension   . Infection    UTI  . Ovarian cyst   . Schizoaffective disorder ([place]) 05/05/2016  . SVT (supraventricular tachycardia) ([facility])     Past Surgical History:  Procedure Laterality Date  . addenoidectomy    . BREAST ENHANCEMENT SURGERY    . BREAST SURGERY    . KNEE SURGERY    . KNEE SURGERY Left   . TONSILLECTOMY    . WISDOM TOOTH EXTRACTION     Family History:  Family History  Problem Relation Age of Onset  . Alcoholism Unknown   . Leukemia Unknown   . Cervical cancer Unknown   . Bone cancer Unknown   . Cirrhosis Unknown   . Diabetes Mellitus II Unknown   . Depression Unknown   . Hypertension Unknown   . Thyroid cancer Unknown   .  Diabetes Mellitus II Mother   . Hypertension Mother   . Alcoholism Father   . Mental illness Paternal Grandmother    Family Psychiatric  History: Unknown Social History:  Social History   Substance and Sexual Activity  Alcohol Use Yes  . Alcohol/week: 0.0 oz   Comment: states has not drank alcohol since 12/19/16     Social  History   Substance and Sexual Activity  Drug Use Yes  . Types: Methamphetamines   Comment: former-opiate use    Social History   Socioeconomic History  . Marital status: Divorced    Spouse name: None  . Number of children: None  . Years of education: None  . Highest education level: None  Social Needs  . Financial resource strain: None  . Food insecurity - worry: None  . Food insecurity - inability: None  . Transportation needs - medical: None  . Transportation needs - non-medical: None  Occupational History  . Occupation: Metallurgist: Megan Edwards  Tobacco Use  . Smoking status: Never Smoker  . Smokeless tobacco: Never Used  Substance and Sexual Activity  . Alcohol use: Yes    Alcohol/week: 0.0 oz    Comment: states has not drank alcohol since 12/19/16  . Drug use: Yes    Types: Methamphetamines    Comment: former-opiate use  . Sexual activity: Not Currently    Birth control/protection: Abstinence, None  Other Topics Concern  . None  Social History Narrative  . None   Additional Social History:    Allergies:   Allergies  Allergen Reactions  . Other Other (See Comments)    Pt does not take narcotics, is on Suboxone.   Marland Kitchen Penicillins Rash and Other (See Comments)    CHILDHOOD ALLERGY Has patient had a PCN reaction causing immediate rash, facial/tongue/throat swelling, SOB or lightheadedness with hypotension: Yes Has patient had a PCN reaction causing severe rash involving mucus membranes or skin necrosis: No Has patient had a PCN reaction that required hospitalization No Has patient had a PCN reaction occurring within the last 10 years: No If all of the above answers are "NO", then may proceed with Cephalosporin use.    Labs:  Results for orders placed or performed during the hospital encounter of 03/28/17 (from the past 48 hour(s))  Urine rapid drug screen (hosp performed)     Status: None   Collection Time: 03/28/17  2:12 PM  Result Value Ref  Range   Opiates NONE DETECTED NONE DETECTED   Cocaine NONE DETECTED NONE DETECTED   Benzodiazepines NONE DETECTED NONE DETECTED   Amphetamines NONE DETECTED NONE DETECTED   Tetrahydrocannabinol NONE DETECTED NONE DETECTED   Barbiturates NONE DETECTED NONE DETECTED    Comment: (NOTE) DRUG SCREEN FOR MEDICAL PURPOSES ONLY.  IF CONFIRMATION IS NEEDED FOR ANY PURPOSE, NOTIFY LAB WITHIN 5 DAYS. LOWEST DETECTABLE LIMITS FOR URINE DRUG SCREEN Drug Class                     Cutoff (ng/mL) Amphetamine and metabolites    1000 Barbiturate and metabolites    200 Benzodiazepine                 947 Tricyclics and metabolites     300 Opiates and metabolites        300 Cocaine and metabolites        300 THC  50   Urinalysis, Routine w reflex microscopic     Status: Abnormal   Collection Time: 03/28/17  2:12 PM  Result Value Ref Range   Color, Urine YELLOW YELLOW   APPearance TURBID (A) CLEAR   Specific Gravity, Urine 1.016 1.005 - 1.030   pH 5.0 5.0 - 8.0   Glucose, UA NEGATIVE NEGATIVE mg/dL   Hgb urine dipstick SMALL (A) NEGATIVE   Bilirubin Urine NEGATIVE NEGATIVE   Ketones, ur NEGATIVE NEGATIVE mg/dL   Protein, ur 100 (A) NEGATIVE mg/dL   Nitrite NEGATIVE NEGATIVE   Leukocytes, UA LARGE (A) NEGATIVE   RBC / HPF TOO NUMEROUS TO COUNT 0 - 5 RBC/hpf   WBC, UA TOO NUMEROUS TO COUNT 0 - 5 WBC/hpf   Bacteria, UA MANY (A) NONE SEEN   Squamous Epithelial / LPF TOO NUMEROUS TO COUNT (A) NONE SEEN   WBC Clumps PRESENT    Hyaline Casts, UA PRESENT   CBC w/diff     Status: Abnormal   Collection Time: 03/28/17  2:20 PM  Result Value Ref Range   WBC 10.2 4.0 - 10.5 K/uL   RBC 5.19 (H) 3.87 - 5.11 MIL/uL   Hemoglobin 14.8 12.0 - 15.0 g/dL   HCT 43.5 36.0 - 46.0 %   MCV 83.8 78.0 - 100.0 fL   MCH 28.5 26.0 - 34.0 pg   MCHC 34.0 30.0 - 36.0 g/dL   RDW 13.8 11.5 - 15.5 %   Platelets 378 150 - 400 K/uL   Neutrophils Relative % 72 %   Neutro Abs 7.4 1.7 - 7.7 K/uL    Lymphocytes Relative 23 %   Lymphs Abs 2.3 0.7 - 4.0 K/uL   Monocytes Relative 4 %   Monocytes Absolute 0.4 0.1 - 1.0 K/uL   Eosinophils Relative 1 %   Eosinophils Absolute 0.1 0.0 - 0.7 K/uL   Basophils Relative 0 %   Basophils Absolute 0.0 0.0 - 0.1 K/uL  Comprehensive metabolic panel     Status: Abnormal   Collection Time: 03/28/17  2:20 PM  Result Value Ref Range   Sodium 138 135 - 145 mmol/L   Potassium 3.5 3.5 - 5.1 mmol/L   Chloride 103 101 - 111 mmol/L   CO2 26 22 - 32 mmol/L   Glucose, Bld 93 65 - 99 mg/dL   BUN 12 6 - 20 mg/dL   Creatinine, Ser 0.92 0.44 - 1.00 mg/dL   Calcium 9.7 8.9 - 10.3 mg/dL   Total Protein 8.9 (H) 6.5 - 8.1 g/dL   Albumin 5.0 3.5 - 5.0 g/dL   AST 21 15 - 41 U/L   ALT 21 14 - 54 U/L   Alkaline Phosphatase 115 38 - 126 U/L   Total Bilirubin 0.6 0.3 - 1.2 mg/dL   GFR calc non Af Amer >60 >60 mL/min   GFR calc Af Amer >60 >60 mL/min    Comment: (NOTE) The eGFR has been calculated using the CKD EPI equation. This calculation has not been validated in all clinical situations. eGFR's persistently <60 mL/min signify possible Chronic Kidney Disease.    Anion gap 9 5 - 15  Ethanol     Status: None   Collection Time: 03/28/17  2:20 PM  Result Value Ref Range   Alcohol, Ethyl (B) <10 <10 mg/dL    Comment:        LOWEST DETECTABLE LIMIT FOR SERUM ALCOHOL IS 10 mg/dL FOR MEDICAL PURPOSES ONLY   Salicylate level     Status: None  Collection Time: 03/28/17  2:20 PM  Result Value Ref Range   Salicylate Lvl <4.0 2.8 - 30.0 mg/dL  Acetaminophen level     Status: Abnormal   Collection Time: 03/28/17  2:20 PM  Result Value Ref Range   Acetaminophen (Tylenol), Serum <10 (L) 10 - 30 ug/mL    Comment:        THERAPEUTIC CONCENTRATIONS VARY SIGNIFICANTLY. A RANGE OF 10-30 ug/mL MAY BE AN EFFECTIVE CONCENTRATION FOR MANY PATIENTS. HOWEVER, SOME ARE BEST TREATED AT CONCENTRATIONS OUTSIDE THIS RANGE. ACETAMINOPHEN CONCENTRATIONS >150 ug/mL AT 4 HOURS  AFTER INGESTION AND >50 ug/mL AT 12 HOURS AFTER INGESTION ARE OFTEN ASSOCIATED WITH TOXIC REACTIONS.   Lipase, blood     Status: None   Collection Time: 03/28/17  2:20 PM  Result Value Ref Range   Lipase 25 11 - 51 U/L  I-Stat beta hCG blood, ED ([facility], [facility], [facility] only)     Status: None   Collection Time: 03/28/17  2:32 PM  Result Value Ref Range   I-stat hCG, quantitative <5.0 <5 mIU/mL   Comment 3            Comment:   GEST. AGE      CONC.  (mIU/mL)   <=1 WEEK        5 - 50     2 WEEKS       50 - 500     3 WEEKS       100 - 10,000     4 WEEKS     1,000 - 30,000        FEMALE AND NON-PREGNANT FEMALE:     LESS THAN 5 mIU/mL   Lithium level     Status: Abnormal   Collection Time: 03/29/17  9:54 AM  Result Value Ref Range   Lithium Lvl 0.30 (L) 0.60 - 1.20 mmol/L  Urinalysis, Routine w reflex microscopic     Status: Abnormal   Collection Time: 03/29/17 10:00 AM  Result Value Ref Range   Color, Urine STRAW (A) YELLOW   APPearance CLEAR CLEAR   Specific Gravity, Urine 1.002 (L) 1.005 - 1.030   pH 7.0 5.0 - 8.0   Glucose, UA NEGATIVE NEGATIVE mg/dL   Hgb urine dipstick SMALL (A) NEGATIVE   Bilirubin Urine NEGATIVE NEGATIVE   Ketones, ur NEGATIVE NEGATIVE mg/dL   Protein, ur NEGATIVE NEGATIVE mg/dL   Nitrite NEGATIVE NEGATIVE   Leukocytes, UA NEGATIVE NEGATIVE   RBC / HPF 0-5 0 - 5 RBC/hpf   WBC, UA 0-5 0 - 5 WBC/hpf   Bacteria, UA RARE (A) NONE SEEN   Squamous Epithelial / LPF 0-5 (A) NONE SEEN    Current Facility-Administered Medications  Medication Dose Route Frequency Provider Last Rate Last Dose  . acetaminophen (TYLENOL) tablet 650 mg  650 mg Oral Q4H PRN Street, [place], [place]      . alum & mag hydroxide-simeth (MAALOX/MYLANTA) 200-200-20 MG/5ML suspension 30 mL  30 mL Oral Q6H PRN Street, [place], [place]      . atomoxetine (STRATTERA) capsule 80 mg  80 mg Oral Daily [address] [place], PA-C   80 mg at 03/29/17 1044  . busPIRone (BUSPAR) tablet 15 mg  15 mg Oral BID Street,  [place], [place]   15 mg at 03/29/17 1044  . cloNIDine (CATAPRES) tablet 0.1 mg  0.1 mg Oral QID Patrecia Pour, NP   0.1 mg at 03/29/17 1045   Followed by  . [START ON 03/31/2017] cloNIDine (CATAPRES) tablet 0.1 mg  0.1 mg Oral BH-qamhs Lord, Asa Saunas, NP       Followed by  . [START ON 04/02/2017] cloNIDine (CATAPRES) tablet 0.1 mg  0.1 mg Oral QAC breakfast Patrecia Pour, NP      . dicyclomine (BENTYL) tablet 20 mg  20 mg Oral Q6H PRN Patrecia Pour, NP   20 mg at 03/29/17 9758  . hydrOXYzine (ATARAX/VISTARIL) tablet 25 mg  25 mg Oral Q6H PRN Patrecia Pour, NP   25 mg at 03/29/17 0700  . labetalol (NORMODYNE) tablet 100 mg  100 mg Oral Daily [address] [place], [place]   100 mg at 03/29/17 1047   And  . labetalol (NORMODYNE) tablet 200 mg  200 mg Oral QHS [address] [place], [place]   200 mg at 03/28/17 2237  . lamoTRIgine (LAMICTAL) tablet 25 mg  25 mg Oral Daily [address] [place], [place]   25 mg at 03/29/17 1046  . lithium carbonate capsule 900 mg  900 mg Oral [address], [place], [place]   900 mg at 03/28/17 2125  . loperamide (IMODIUM) capsule 2-4 mg  2-4 mg Oral PRN Patrecia Pour, NP   2 mg at 03/29/17 8325  . methocarbamol (ROBAXIN) tablet 500 mg  500 mg Oral Q8H PRN Patrecia Pour, NP      . naproxen (NAPROSYN) tablet 500 mg  500 mg Oral BID PRN Patrecia Pour, NP      . ondansetron (ZOFRAN-ODT) disintegrating tablet 4 mg  4 mg Oral Q6H PRN Patrecia Pour, NP   4 mg at 03/29/17 0905  . PARoxetine (PAXIL) tablet 20 mg  20 mg Oral Daily [address], [place], [place]   20 mg at 03/29/17 1046  . QUEtiapine (SEROQUEL) tablet 300 mg  300 mg Oral BID [address], [place], [place]   300 mg at 03/29/17 1046  . rivaroxaban (XARELTO) tablet 20 mg  20 mg Oral Q supper [address] [place], [place]   20 mg at 03/28/17 1858  . traZODone (DESYREL) tablet 200 mg  200 mg Oral QHS [address] [place], [place]   200 mg at 03/28/17 2125   Current Outpatient Medications  Medication Sig Dispense Refill  . atomoxetine (STRATTERA) 80 MG  capsule Take 80 mg by mouth daily.  6  . busPIRone (BUSPAR) 15 MG tablet Take 15 mg by mouth 2 (two) times daily.  2  . gabapentin (NEURONTIN) 100 MG capsule Take 200 mg by mouth at bedtime.  2  . labetalol (NORMODYNE) 100 MG tablet Take 1 tablet (100 mg) in the morning & 2 tablets (200 mg) in the evening: For high blood pressure 21 tablet 0  . labetalol (NORMODYNE) 200 MG tablet Take 200 mg by mouth 2 (two) times daily.  0  . lamoTRIgine (LAMICTAL) 25 MG tablet Take 25 mg by mouth daily.  2  . lithium carbonate 300 MG capsule Take 3 capsules (900 mg total) by mouth at bedtime. For mood stabilization 90 capsule 0  . PARoxetine (PAXIL) 20 MG tablet Take 1 tablet (20 mg total) by mouth daily. 30 tablet 0  . promethazine (PHENERGAN) 25 MG tablet TAKE ONE TABLET BY MOUTH ONCE DAILY AS NEEDED FOR NAUSEA AND VOMITTING  2  . QUEtiapine (SEROQUEL) 300 MG tablet Take 300 mg by mouth 2 (two) times daily.  2  . trazodone (DESYREL) 300 MG tablet Take 300 mg by mouth at bedtime.    Alveda Reasons 20 MG TABS tablet Take 20 mg by mouth daily.  0  . benztropine (COGENTIN) 1 MG  tablet Take 1 tablet (1 mg total) by mouth at bedtime. For prevention drug related tremors. (Patient not taking: Reported on 03/28/2017) 30 tablet 0  . ELIQUIS STARTER PACK (ELIQUIS STARTER PACK) 5 MG TABS Take as directed on package: start with two-15m tablets twice daily for 7 days. On day 8, switch to one-552mtablet twice daily. (Patient not taking: Reported on 03/28/2017) 1 each 0  . gabapentin (NEURONTIN) 300 MG capsule Take 1 capsule (300 mg total) by mouth 3 (three) times daily. (Patient not taking: Reported on 03/28/2017) 90 capsule 0  . paliperidone (INVEGA) 9 MG 24 hr tablet Take 1 tablet (9 mg total) by mouth at bedtime. For mood control (Patient not taking: Reported on 03/28/2017) 60 tablet 0  . prazosin (MINIPRESS) 2 MG capsule Take 1 capsule (2 mg total) by mouth at bedtime. For nightmares (Patient not taking: Reported on 03/28/2017) 30 capsule  0  . QUEtiapine (SEROQUEL) 200 MG tablet Take 200 mg by mouth 2 (two) times daily.   5    Musculoskeletal: Strength & Muscle Tone: within normal limits Gait & Station: normal Patient leans: N/A  Psychiatric Specialty Exam: Physical Exam  Constitutional: She is oriented to person, place, and time. She appears well-developed and well-nourished.  HENT:  Head: Normocephalic.  Respiratory: Effort normal.  Musculoskeletal: Normal range of motion.  Neurological: She is alert and oriented to person, place, and time.  Psychiatric: Her speech is normal. Thought content normal. Her mood appears anxious. She is agitated. Cognition and memory are normal. She expresses impulsivity. She exhibits a depressed mood.    Review of Systems  Psychiatric/Behavioral: Positive for depression. Negative for hallucinations, memory loss, substance abuse and suicidal ideas. The patient is nervous/anxious. The patient does not have insomnia.   All other systems reviewed and are negative.   Blood pressure 130/64, pulse 74, temperature 98.1 F (36.7 C), temperature source Oral, resp. rate 19, height 5' 7" (1.702 m), weight 90.7 kg (200 lb), SpO2 99 %, not currently breastfeeding.Body mass index is 31.32 kg/m.  General Appearance: Casual  Eye Contact:  Good  Speech:  Clear and Coherent  Volume:  Increased  Mood:  Anxious and Depressed  Affect:  Congruent and Depressed  Thought Process:  Coherent, Goal Directed and Linear  Orientation:  Full (Time, Place, and Person)  Thought Content:  Logical  Suicidal Thoughts:  No  Homicidal Thoughts:  No  Memory:  Immediate;   Good Recent;   Good Remote;   Fair  Judgement:  Fair  Insight:  Fair  Psychomotor Activity:  Normal  Concentration:  Concentration: Good and Attention Span: Good  Recall:  Good  Fund of Knowledge:  Good  Language:  Good  Akathisia:  No  Handed:  Right  AIMS (if indicated):     Assets:  CoSales promotion account executiveousing Physical Health Resilience Social Support  ADL's:  Intact  Cognition:  WNL  Sleep:        Treatment Plan Summary: Plan Bipolar Disorder 1  Discharge Home  Follow up with [facility] Follow up with [facility] for medication management Avoid the use of alcohol and illicit drugs Take all medications as prescribed  Disposition: No evidence of imminent risk to self or others at present.   Patient does not meet criteria for psychiatric inpatient admission. Supportive therapy provided about ongoing stressors. Discussed crisis plan, support from social network, calling 911, coming to the Emergency Department, and calling Suicide Hotline.  LaBilley Chang  Romilda Garret, NP 03/29/2017 12:01 PM  Patient seen face-to-face for psychiatric evaluation, chart reviewed and case discussed with the physician extender and developed treatment plan. Reviewed the information documented and agree with the treatment plan. Corena Pilgrim, MD

## 2017-03-29 NOTE — [facility] Suicide Risk Assessment (Signed)
Suicide Risk Assessment  Discharge Assessment   [facility]   Principal Problem: Adjustment disorder with disturbance of emotion Discharge Diagnoses:  Patient Active Problem List   Diagnosis Date Noted  . Adjustment disorder with disturbance of emotion [F43.29] 03/29/2017  . Alcohol abuse with alcohol-induced mood disorder ([facility]) [F10.14] 01/18/2017  . Anxiety and depression [F41.9, F32.9] 01/14/2017  . Pulmonary embolism ([facility]) [I26.99] 12/19/2016  . Alcohol withdrawal ([facility]) [F10.239] 12/01/2016  . ADD (attention deficit disorder) [F98.8] 05/10/2016  . Opioid use disorder, mild, in controlled environment ([facility]) [F11.10] 05/06/2016  . Essential hypertension [I10] 05/06/2016  . Alcohol use disorder, moderate, in early remission, dependence ([facility]) [F10.21] 05/06/2016  . Bipolar I disorder, most recent episode manic, severe with psychotic features ([facility]) [F31.2] 01/20/2016  . Chronic pain syndrome [G89.4]     Total Time spent with patient: 45 minutes  Musculoskeletal: Strength & Muscle Tone: within normal limits Gait & Station: normal Patient leans: N/A  Psychiatric Specialty Exam: Physical Exam  Constitutional: She is oriented to person, place, and time. She appears well-developed and well-nourished.  HENT:  Head: Normocephalic.  Respiratory: Effort normal.  Musculoskeletal: Normal range of motion.  Neurological: She is alert and oriented to person, place, and time.  Psychiatric: Her speech is normal. Thought content normal. Her mood appears anxious. She is agitated. Cognition and memory are normal. She expresses impulsivity. She exhibits a depressed mood.   Review of Systems  Psychiatric/Behavioral: Positive for depression. Negative for hallucinations, memory loss, substance abuse and suicidal ideas. The patient is nervous/anxious. The patient does not have insomnia.   All other systems reviewed and are negative.  Blood pressure 130/64, pulse 74,  temperature 98.1 F (36.7 C), temperature source Oral, resp. rate 19, height 5\' 7"  (1.702 m), weight 90.7 kg (200 lb), SpO2 99 %, not currently breastfeeding.Body mass index is 31.32 kg/m. General Appearance: Casual Eye Contact:  Good Speech:  Clear and Coherent Volume:  Increased Mood:  Anxious and Depressed Affect:  Congruent and Depressed Thought Process:  Coherent, Goal Directed and Linear Orientation:  Full (Time, Place, and Person) Thought Content:  Logical Suicidal Thoughts:  No Homicidal Thoughts:  No Memory:  Immediate;   Good Recent;   Good Remote;   Fair Judgement:  Fair Insight:  Fair Psychomotor Activity:  Normal Concentration:  Concentration: Good and Attention Span: Good Recall:  Good Fund of Knowledge:  Good Language:  Good Akathisia:  No Handed:  Right AIMS (if indicated):    Assets:  ArchitectCommunication Skills Financial Resources/Insurance Housing Physical Health Resilience Social Support ADL's:  Intact Cognition:  WNL   Mental Status Per Nursing Assessment::   On Admission:   Passive suicidal ideation without a plan  Demographic Factors:  Caucasian  Loss Factors: Financial problems/change in socioeconomic status  Historical Factors: Impulsivity  Risk Reduction Factors:   Sense of responsibility to family  Continued Clinical Symptoms:  Bipolar Disorder:   Mixed State Depression:   Impulsivity Alcohol/Substance Abuse/Dependencies Previous Psychiatric Diagnoses and Treatments  Cognitive Features That Contribute To Risk:  Closed-mindedness    Suicide Risk:  Minimal: No identifiable suicidal ideation.  Patients presenting with no risk factors but with morbid ruminations; may be classified as minimal risk based on the severity of the depressive symptoms    Plan Of Care/Follow-up recommendations:  Activity:  as tolerated Diet:  Heart Healthy  Laveda AbbeLaurie Britton Parks, NP 03/29/2017, 11:58 AM

## 2017-03-29 NOTE — ED Notes (Signed)
Also gave Bentyl but prn's disappeared off the screen.  Patient wanted immodium also for her continued episodes of diarrhea this morning.

## 2017-03-29 NOTE — ED Notes (Signed)
Patient resting in bed, calm and cooperative.  ?

## 2017-03-29 NOTE — Patient Outreach (Signed)
[facility]PSS spoke with the patient and utilized motivational interviewing skills to provide support for the patient. CPSS highlighted how the patient has worked very hard to stay sober. CPSS also wanted acknowledge the patient for her progress with going to 12 step meetings and staying sober. CPSS provided [facility] contact information and encouraged the patient to contact [facility]PSS at anytime for substance use recovery support.

## 2017-03-29 NOTE — Discharge Instructions (Signed)
Follow up:  [facility] [address]  [address] [place], [place] [id number] [phone]   [facility] [address] [place], [place] [phone]

## 2017-03-29 NOTE — ED Notes (Signed)
Patient's counselor, Vivi Martensale Slaughter called to offer information on patient he has [facility].  His number is [phone]

## 2017-03-29 NOTE — ED Notes (Signed)
Patient reports she is still having nausea and diarrhea.  She reports she has been dealing with a lot of issues surrounding a custody battle and has hardly been able to get out of bed in the past month.  She reports she has been taking Paxil with good results, but recently a doctor changed her medications and since then she hasn't been doing as well.  "I have something inside of me that needs to come out."

## 2017-03-29 NOTE — ED Notes (Signed)
Lab called to draw lithium level.  Patient given urine cup to obtain urine sample.

## 2017-03-30 ENCOUNTER — Inpatient Hospital Stay (HOSPITAL_COMMUNITY)
Admission: AD | Admit: 2017-03-30 | Discharge: 2017-04-04 | DRG: 885 | Disposition: A | Discharge status: Home / Self Care | Payer: Medicaid Other | Source: Intra-hospital | Attending: Psychiatry | Admitting: Psychiatry

## 2017-03-30 ENCOUNTER — Encounter (HOSPITAL_COMMUNITY): Payer: Self-pay

## 2017-03-30 ENCOUNTER — Emergency Department (HOSPITAL_COMMUNITY)
Admission: EM | Admit: 2017-03-30 | Discharge: 2017-03-30 | Disposition: A | Discharge status: Other Acute Inpatient Hospital | Payer: Medicaid Other | Attending: Emergency Medicine | Admitting: Emergency Medicine

## 2017-03-30 DIAGNOSIS — I10 Essential (primary) hypertension: Secondary | ICD-10-CM | POA: Diagnosis present

## 2017-03-30 DIAGNOSIS — Z79899 Other long term (current) drug therapy: Secondary | ICD-10-CM | POA: Insufficient documentation

## 2017-03-30 DIAGNOSIS — Z7901 Long term (current) use of anticoagulants: Secondary | ICD-10-CM

## 2017-03-30 DIAGNOSIS — F419 Anxiety disorder, unspecified: Secondary | ICD-10-CM | POA: Diagnosis not present

## 2017-03-30 DIAGNOSIS — G4700 Insomnia, unspecified: Secondary | ICD-10-CM | POA: Diagnosis not present

## 2017-03-30 DIAGNOSIS — Y999 Unspecified external cause status: Secondary | ICD-10-CM | POA: Diagnosis not present

## 2017-03-30 DIAGNOSIS — Z880 Allergy status to penicillin: Secondary | ICD-10-CM | POA: Diagnosis not present

## 2017-03-30 DIAGNOSIS — Z86711 Personal history of pulmonary embolism: Secondary | ICD-10-CM | POA: Diagnosis not present

## 2017-03-30 DIAGNOSIS — F1910 Other psychoactive substance abuse, uncomplicated: Secondary | ICD-10-CM | POA: Diagnosis not present

## 2017-03-30 DIAGNOSIS — F250 Schizoaffective disorder, bipolar type: Secondary | ICD-10-CM | POA: Diagnosis present

## 2017-03-30 DIAGNOSIS — R440 Auditory hallucinations: Secondary | ICD-10-CM | POA: Diagnosis not present

## 2017-03-30 DIAGNOSIS — F4329 Adjustment disorder with other symptoms: Secondary | ICD-10-CM | POA: Diagnosis present

## 2017-03-30 DIAGNOSIS — T1491XA Suicide attempt, initial encounter: Secondary | ICD-10-CM

## 2017-03-30 DIAGNOSIS — R450 Nervousness: Secondary | ICD-10-CM | POA: Diagnosis not present

## 2017-03-30 DIAGNOSIS — Z811 Family history of alcohol abuse and dependence: Secondary | ICD-10-CM | POA: Diagnosis not present

## 2017-03-30 DIAGNOSIS — F1099 Alcohol use, unspecified with unspecified alcohol-induced disorder: Secondary | ICD-10-CM | POA: Diagnosis not present

## 2017-03-30 DIAGNOSIS — Y939 Activity, unspecified: Secondary | ICD-10-CM | POA: Insufficient documentation

## 2017-03-30 DIAGNOSIS — R45851 Suicidal ideations: Secondary | ICD-10-CM | POA: Insufficient documentation

## 2017-03-30 DIAGNOSIS — X781XXA Intentional self-harm by knife, initial encounter: Secondary | ICD-10-CM | POA: Diagnosis not present

## 2017-03-30 DIAGNOSIS — Z560 Unemployment, unspecified: Secondary | ICD-10-CM | POA: Diagnosis not present

## 2017-03-30 DIAGNOSIS — Z818 Family history of other mental and behavioral disorders: Secondary | ICD-10-CM | POA: Diagnosis not present

## 2017-03-30 DIAGNOSIS — R000 Tachycardia, unspecified: Secondary | ICD-10-CM | POA: Diagnosis not present

## 2017-03-30 DIAGNOSIS — S51811A Laceration without foreign body of right forearm, initial encounter: Secondary | ICD-10-CM | POA: Diagnosis not present

## 2017-03-30 DIAGNOSIS — Y929 Unspecified place or not applicable: Secondary | ICD-10-CM | POA: Insufficient documentation

## 2017-03-30 DIAGNOSIS — F329 Major depressive disorder, single episode, unspecified: Secondary | ICD-10-CM | POA: Diagnosis present

## 2017-03-30 DIAGNOSIS — R443 Hallucinations, unspecified: Secondary | ICD-10-CM | POA: Diagnosis not present

## 2017-03-30 DIAGNOSIS — F259 Schizoaffective disorder, unspecified: Principal | ICD-10-CM | POA: Diagnosis present

## 2017-03-30 DIAGNOSIS — F39 Unspecified mood [affective] disorder: Secondary | ICD-10-CM | POA: Diagnosis not present

## 2017-03-30 LAB — COMPREHENSIVE METABOLIC PANEL
ALK PHOS: 109 U/L (ref 38–126)
ALT: 18 U/L (ref 14–54)
AST: 21 U/L (ref 15–41)
Albumin: 4.3 g/dL (ref 3.5–5.0)
Anion gap: 11 (ref 5–15)
BILIRUBIN TOTAL: 0.5 mg/dL (ref 0.3–1.2)
BUN: 10 mg/dL (ref 6–20)
CALCIUM: 9.7 mg/dL (ref 8.9–10.3)
CHLORIDE: 97 mmol/L — AB (ref 101–111)
CO2: 25 mmol/L (ref 22–32)
CREATININE: 1.09 mg/dL — AB (ref 0.44–1.00)
Glucose, Bld: 101 mg/dL — ABNORMAL HIGH (ref 65–99)
Potassium: 3.6 mmol/L (ref 3.5–5.1)
Sodium: 133 mmol/L — ABNORMAL LOW (ref 135–145)
TOTAL PROTEIN: 7.8 g/dL (ref 6.5–8.1)

## 2017-03-30 LAB — RAPID URINE DRUG SCREEN, HOSP PERFORMED
AMPHETAMINES: NOT DETECTED
Barbiturates: NOT DETECTED
Benzodiazepines: NOT DETECTED
Cocaine: NOT DETECTED
Opiates: NOT DETECTED
Tetrahydrocannabinol: NOT DETECTED

## 2017-03-30 LAB — CBC
HCT: 40.5 % (ref 36.0–46.0)
Hemoglobin: 13.2 g/dL (ref 12.0–15.0)
MCH: 27.8 pg (ref 26.0–34.0)
MCHC: 32.6 g/dL (ref 30.0–36.0)
MCV: 85.3 fL (ref 78.0–100.0)
PLATELETS: 363 10*3/uL (ref 150–400)
RBC: 4.75 MIL/uL (ref 3.87–5.11)
RDW: 13.6 % (ref 11.5–15.5)
WBC: 10.1 10*3/uL (ref 4.0–10.5)

## 2017-03-30 LAB — ETHANOL

## 2017-03-30 LAB — I-STAT BETA HCG BLOOD, ED (MC, WL, AP ONLY)

## 2017-03-30 LAB — ACETAMINOPHEN LEVEL: Acetaminophen (Tylenol), Serum: <10 ug/mL — ABNORMAL LOW (ref 10–30)

## 2017-03-30 LAB — SALICYLATE LEVEL: Salicylate Lvl: <7 mg/dL (ref 2.8–30.0)

## 2017-03-30 MED ORDER — PAROXETINE HCL 20 MG PO TABS
20.0000 mg | ORAL_TABLET | Freq: Every day | ORAL | Status: DC
Start: 2017-03-30 — End: 2017-03-30
  Administered 2017-03-30: 20 mg via ORAL
  Filled 2017-03-30: qty 1

## 2017-03-30 MED ORDER — QUETIAPINE FUMARATE 300 MG PO TABS
300.0000 mg | ORAL_TABLET | Freq: Two times a day (BID) | ORAL | Status: DC
  Filled 2017-03-30: qty 1

## 2017-03-30 MED ORDER — THIAMINE HCL 100 MG/ML IJ SOLN: 100.0000 mg | Freq: Every day | INTRAMUSCULAR | Status: DC

## 2017-03-30 MED ORDER — NICOTINE 21 MG/24HR TD PT24: 21.0000 mg | MEDICATED_PATCH | Freq: Every day | TRANSDERMAL | Status: DC

## 2017-03-30 MED ORDER — LORAZEPAM 0.5 MG PO TABS
0.5000 mg | ORAL_TABLET | Freq: Once | ORAL | Status: AC
  Administered 2017-03-30: 0.5 mg via ORAL
  Filled 2017-03-30: qty 1

## 2017-03-30 MED ORDER — LABETALOL HCL 100 MG PO TABS
100.0000 mg | ORAL_TABLET | Freq: Three times a day (TID) | ORAL | Status: DC
  Filled 2017-03-30: qty 1

## 2017-03-30 MED ORDER — LORAZEPAM 1 MG PO TABS: 0.0000 mg | ORAL_TABLET | Freq: Four times a day (QID) | ORAL | Status: DC

## 2017-03-30 MED ORDER — ACETAMINOPHEN 325 MG PO TABS
650.0000 mg | ORAL_TABLET | Freq: Four times a day (QID) | ORAL | Status: DC | PRN
  Administered 2017-03-31 – 2017-04-04 (×6): 650 mg via ORAL
  Filled 2017-03-30 (×6): qty 2

## 2017-03-30 MED ORDER — LORAZEPAM 2 MG/ML IJ SOLN: 0.0000 mg | Freq: Four times a day (QID) | INTRAMUSCULAR | Status: DC

## 2017-03-30 MED ORDER — TRAZODONE HCL 150 MG PO TABS
300.0000 mg | ORAL_TABLET | Freq: Every evening | ORAL | Status: DC | PRN
  Administered 2017-03-30: 300 mg via ORAL
  Filled 2017-03-30: qty 2

## 2017-03-30 MED ORDER — VITAMIN B-1 100 MG PO TABS: 100.0000 mg | ORAL_TABLET | Freq: Every day | ORAL | Status: DC

## 2017-03-30 MED ORDER — ACETAMINOPHEN 325 MG PO TABS
650.0000 mg | ORAL_TABLET | ORAL | Status: DC | PRN
Start: 2017-03-30 — End: 2017-03-30

## 2017-03-30 MED ORDER — LAMOTRIGINE 25 MG PO TABS
25.0000 mg | ORAL_TABLET | Freq: Every day | ORAL | Status: DC
  Administered 2017-03-30: 25 mg via ORAL
  Filled 2017-03-30: qty 1

## 2017-03-30 MED ORDER — BUSPIRONE HCL 10 MG PO TABS
15.0000 mg | ORAL_TABLET | Freq: Two times a day (BID) | ORAL | Status: DC
  Administered 2017-03-30: 15 mg via ORAL
  Filled 2017-03-30: qty 2

## 2017-03-30 MED ORDER — GABAPENTIN 100 MG PO CAPS: 200.0000 mg | ORAL_CAPSULE | Freq: Every day | ORAL | Status: DC

## 2017-03-30 MED ORDER — OLANZAPINE 5 MG PO TBDP
5.0000 mg | ORAL_TABLET | Freq: Once | ORAL | Status: AC
  Administered 2017-03-30: 5 mg via ORAL
  Filled 2017-03-30 (×2): qty 1

## 2017-03-30 MED ORDER — ACETAMINOPHEN 325 MG PO TABS
650.0000 mg | ORAL_TABLET | Freq: Once | ORAL | Status: AC
  Administered 2017-03-30: 650 mg via ORAL
  Filled 2017-03-30: qty 2

## 2017-03-30 MED ORDER — LABETALOL HCL 100 MG PO TABS
100.0000 mg | ORAL_TABLET | Freq: Three times a day (TID) | ORAL | Status: DC
  Administered 2017-03-30 – 2017-04-03 (×12): 100 mg via ORAL
  Filled 2017-03-30 (×18): qty 1

## 2017-03-30 MED ORDER — ALUM & MAG HYDROXIDE-SIMETH 200-200-20 MG/5ML PO SUSP: 30.0000 mL | ORAL | Status: DC | PRN

## 2017-03-30 MED ORDER — ONDANSETRON 4 MG PO TBDP
4.0000 mg | ORAL_TABLET | Freq: Once | ORAL | Status: AC
  Administered 2017-03-30: 4 mg via ORAL
  Filled 2017-03-30: qty 1

## 2017-03-30 MED ORDER — LITHIUM CARBONATE 300 MG PO CAPS
900.0000 mg | ORAL_CAPSULE | Freq: Every day | ORAL | Status: DC
Start: 2017-03-30 — End: 2017-03-30

## 2017-03-30 MED ORDER — LIDOCAINE-EPINEPHRINE (PF) 2 %-1:200000 IJ SOLN
10.0000 mL | Freq: Once | INTRAMUSCULAR | Status: AC
  Administered 2017-03-30: 10 mL
  Filled 2017-03-30: qty 20

## 2017-03-30 MED ORDER — ZOLPIDEM TARTRATE 5 MG PO TABS: 5.0000 mg | ORAL_TABLET | Freq: Every evening | ORAL | Status: DC | PRN

## 2017-03-30 MED ORDER — LABETALOL HCL 200 MG PO TABS: 200.0000 mg | ORAL_TABLET | Freq: Two times a day (BID) | ORAL | Status: DC

## 2017-03-30 MED ORDER — HYDROXYZINE HCL 25 MG PO TABS
25.0000 mg | ORAL_TABLET | Freq: Three times a day (TID) | ORAL | Status: DC | PRN
  Administered 2017-04-01 – 2017-04-03 (×6): 25 mg via ORAL
  Filled 2017-03-30 (×6): qty 1

## 2017-03-30 MED ORDER — QUETIAPINE FUMARATE 50 MG PO TABS: 300.0000 mg | ORAL_TABLET | Freq: Two times a day (BID) | ORAL | Status: DC

## 2017-03-30 MED ORDER — RIVAROXABAN 20 MG PO TABS
20.0000 mg | ORAL_TABLET | Freq: Every day | ORAL | Status: DC
  Administered 2017-03-30: 20 mg via ORAL
  Filled 2017-03-30: qty 1

## 2017-03-30 MED ORDER — RIVAROXABAN 20 MG PO TABS
20.0000 mg | ORAL_TABLET | Freq: Every day | ORAL | Status: DC
  Administered 2017-03-31 – 2017-04-04 (×5): 20 mg via ORAL
  Filled 2017-03-30 (×7): qty 1

## 2017-03-30 MED ORDER — LORAZEPAM 1 MG PO TABS
1.0000 mg | ORAL_TABLET | Freq: Once | ORAL | Status: AC
  Administered 2017-03-30: 1 mg via ORAL
  Filled 2017-03-30: qty 1

## 2017-03-30 MED ORDER — QUETIAPINE FUMARATE 300 MG PO TABS
300.0000 mg | ORAL_TABLET | Freq: Two times a day (BID) | ORAL | Status: DC
  Administered 2017-03-30 – 2017-03-31 (×2): 300 mg via ORAL
  Filled 2017-03-30 (×7): qty 1

## 2017-03-30 MED ORDER — TRAZODONE HCL 100 MG PO TABS: 300.0000 mg | ORAL_TABLET | Freq: Every day | ORAL | Status: DC

## 2017-03-30 MED ORDER — ONDANSETRON HCL 4 MG PO TABS: 4.0000 mg | ORAL_TABLET | Freq: Three times a day (TID) | ORAL | Status: DC | PRN

## 2017-03-30 MED ORDER — TRAZODONE HCL 50 MG PO TABS
50.0000 mg | ORAL_TABLET | Freq: Every evening | ORAL | Status: DC | PRN
  Filled 2017-03-30: qty 1

## 2017-03-30 MED ORDER — ALUM & MAG HYDROXIDE-SIMETH 200-200-20 MG/5ML PO SUSP: 30.0000 mL | Freq: Four times a day (QID) | ORAL | Status: DC | PRN

## 2017-03-30 MED ORDER — MAGNESIUM HYDROXIDE 400 MG/5ML PO SUSP: 30.0000 mL | Freq: Every day | ORAL | Status: DC | PRN

## 2017-03-30 NOTE — ED Notes (Signed)
Pt voiced understanding and agreement w/tx plan [facility] - signed [facility] forms - [facility], copy sent to Medical Records, and [facility].

## 2017-03-30 NOTE — ED Notes (Signed)
Pt. Pacing back and forth in hallway, Moved pt. In Triage 3 to keep an eye on her. Pt. Crying hitting her head on the wall.Called staffing no sitters available.

## 2017-03-30 NOTE — ED Notes (Signed)
Ordered diet tray 

## 2017-03-30 NOTE — [facility] Counselor (Signed)
Attempted assessment.  Assessment delayed as Pt currently receiving sutures.

## 2017-03-30 NOTE — ED Notes (Signed)
Pt ambulatory to F11 wearing burgundy scrubs. Pt alert, oriented, cooperative. Bandaged noted to right anterior forearm - intact, clean, dry. Sitter w/pt.

## 2017-03-30 NOTE — ED Notes (Signed)
Ordered breakfast tray  

## 2017-03-30 NOTE — ED Notes (Signed)
Pt voiced understanding of Medical Clearance Pt Policy form - copy given. States has not drunk ETOH x 6 months.

## 2017-03-30 NOTE — ED Triage Notes (Signed)
Pt comes stating "I just want to die, I just want to die" Pt has superficial laceration to R arm where she cut herself, pt states [facility] telling her to harm herself. Denies HI. Pt states she also has plans to jump in front of a car or overdose. Pt tearful and anxious in triage.

## 2017-03-30 NOTE — ED Notes (Signed)
Pt made 2nd and last phone call. Pt noted to be tearful, crying during phone conversation.

## 2017-03-30 NOTE — Progress Notes (Signed)
[facility] Accepting provider Renata Capriceonrad, NP. Attending physician Cobos, MD. Pt can be transferred during next shift.   Daisy FloroCandace L Correna Meacham MSW, LCSW  03/30/2017 2:08 PM

## 2017-03-30 NOTE — ED Notes (Signed)
[facility]

## 2017-03-30 NOTE — ED Provider Notes (Signed)
[facility] EMERGENCY DEPARTMENT Provider Note   CSN: [id number] Arrival date & time: 03/30/17  0543     History   Chief Complaint Chief Complaint  Patient presents with  . Suicide Attempt    HPI Megan Edwards is a 42 y.o. female with a hx of schizoaffective disorder, anxiety, depression, alcohol abuse, and HTN who presents to the ED s/p suicide attempt. Patient states that she has been hearing voices that are telling her to kill herself. States that she attempted to kill herself by cutting her right arm with a knife. No alleviating/aggravating factors. Voices and SI are persistent. States she is feeling anxious. Having pain to RUE. No other complaints at this time. Denies HI, headache, abdominal pain, numbness, or weakness. Last tetanus was 3 months ago.   HPI  Past Medical History:  Diagnosis Date  . Abnormal Pap smear   . Alcohol abuse   . Anxiety and depression 01/14/2017  . Bipolar 1 disorder (HCC)   . Depression   . Genital herpes 2000  . Hypertension   . Infection    UTI  . Ovarian cyst   . Schizoaffective disorder (HCC) 05/05/2016  . SVT (supraventricular tachycardia) [facility])     Patient Active Problem List   Diagnosis Date Noted  . Adjustment disorder with disturbance of emotion 03/29/2017  . Alcohol abuse with alcohol-induced mood disorder (HCC) 01/18/2017  . Anxiety and depression 01/14/2017  . Pulmonary embolism (HCC) 12/19/2016  . Alcohol withdrawal (HCC) 12/01/2016  . ADD (attention deficit disorder) 05/10/2016  . Opioid use disorder, mild, in controlled environment (HCC) 05/06/2016  . Essential hypertension 05/06/2016  . Alcohol use disorder, moderate, in early remission, dependence (HCC) 05/06/2016  . Bipolar I disorder, most recent episode manic, severe with psychotic features (HCC) 01/20/2016  . Chronic pain syndrome     Past Surgical History:  Procedure Laterality Date  . addenoidectomy    . BREAST ENHANCEMENT SURGERY    .  BREAST SURGERY    . KNEE SURGERY    . KNEE SURGERY Left   . TONSILLECTOMY    . WISDOM TOOTH EXTRACTION      OB History    Gravida Para Term Preterm AB Living   5 2 2  0 3 2   SAB TAB Ectopic Multiple Live Births   1 2 0 0 2       Home Medications    Prior to Admission medications   Medication Sig Start Date End Date Taking? Authorizing Provider  busPIRone (BUSPAR) 15 MG tablet Take 15 mg by mouth 2 (two) times daily. 03/11/17   [provider]  ELIQUIS STARTER PACK (ELIQUIS STARTER PACK) 5 MG TABS Take as directed on package: start with two-5mg  tablets twice daily for 7 days. On day 8, switch to one-5mg  tablet twice daily. Patient not taking: Reported on 03/28/2017 12/20/16   Milagros Loll, MD  gabapentin (NEURONTIN) 100 MG capsule Take 200 mg by mouth at bedtime. 02/26/17   [provider]  gabapentin (NEURONTIN) 300 MG capsule Take 1 capsule (300 mg total) by mouth 3 (three) times daily. Patient not taking: Reported on 03/28/2017 01/18/17   Charm Rings, NP  labetalol (NORMODYNE) 100 MG tablet Take 1 tablet (100 mg) in the morning & 2 tablets (200 mg) in the evening: For high blood pressure 05/13/16   Nwoko, Nicole Kindred I, NP  labetalol (NORMODYNE) 200 MG tablet Take 200 mg by mouth 2 (two) times daily. 02/13/17   [provider]  lamoTRIgine (LAMICTAL) 25 MG tablet Take 25 mg by mouth daily. 03/11/17   [provider]  lithium carbonate 300 MG capsule Take 3 capsules (900 mg total) by mouth at bedtime. For mood stabilization 01/18/17   Charm RingsLord, Jamison Y, NP  paliperidone (INVEGA) 9 MG 24 hr tablet Take 1 tablet (9 mg total) by mouth at bedtime. For mood control Patient not taking: Reported on 03/28/2017 01/18/17   Charm RingsLord, Jamison Y, NP  PARoxetine (PAXIL) 20 MG tablet Take 1 tablet (20 mg total) by mouth daily. 01/18/17   Charm RingsLord, Jamison Y, NP  prazosin (MINIPRESS) 2 MG capsule Take 1 capsule (2 mg total) by mouth at bedtime. For nightmares Patient not taking:  Reported on 03/28/2017 01/18/17   Charm RingsLord, Jamison Y, NP  QUEtiapine (SEROQUEL) 300 MG tablet Take 300 mg by mouth 2 (two) times daily. 03/07/17   [provider]  trazodone (DESYREL) 300 MG tablet Take 300 mg by mouth at bedtime. 01/22/16   [provider]  XARELTO 20 MG TABS tablet Take 20 mg by mouth daily. 02/13/17   [provider]    Family History Family History  Problem Relation Age of Onset  . Alcoholism Unknown   . Leukemia Unknown   . Cervical cancer Unknown   . Bone cancer Unknown   . Cirrhosis Unknown   . Diabetes Mellitus II Unknown   . Depression Unknown   . Hypertension Unknown   . Thyroid cancer Unknown   . Diabetes Mellitus II Mother   . Hypertension Mother   . Alcoholism Father   . Mental illness Paternal Grandmother     Social History Social History   Tobacco Use  . Smoking status: Never Smoker  . Smokeless tobacco: Never Used  Substance Use Topics  . Alcohol use: Yes    Alcohol/week: 0.0 oz    Comment: states has not drank alcohol since 12/19/16  . Drug use: Yes    Types: Methamphetamines    Comment: former-opiate use     Allergies   Other and Penicillins   Review of Systems Review of Systems  Constitutional: Negative for fever.  Respiratory: Negative for shortness of breath.   Cardiovascular: Negative for chest pain.  Gastrointestinal: Negative for abdominal pain and vomiting.  Skin: Positive for wound (RUE).  Neurological: Negative for weakness and numbness.  Psychiatric/Behavioral: Positive for hallucinations, self-injury and suicidal ideas. The patient is nervous/anxious.   All other systems reviewed and are negative.   Physical Exam Updated Vital Signs BP (!) 163/120   Pulse (!) 114   Temp 98 F (36.7 C) (Oral)   Resp (!) 24   SpO2 100%   Physical Exam  Constitutional: She appears well-developed and well-nourished. No distress.  HENT:  Head: Normocephalic and atraumatic.  Eyes: Conjunctivae are  normal. Right eye exhibits no discharge. Left eye exhibits no discharge.  Cardiovascular: Normal rate and regular rhythm.  No murmur heard. Pulses:      Radial pulses are 2+ on the right side, and 2+ on the left side.  Pulmonary/Chest: Breath sounds normal. No respiratory distress. She has no wheezes. She has no rales.  Abdominal: Soft. She exhibits no distension. There is no tenderness.  Neurological: She is alert.  Clear speech. 5/5 grip strength bilaterally. Sensation grossly intact to bilateral upper extremities.   Skin: Skin is warm and dry. Capillary refill takes less than 2 seconds.  There is a 3cm laceration to the anterior aspect of the mid forearm with surrounding excoriations. No foreign  body.   Psychiatric: Her speech is normal and behavior is normal. Her mood appears anxious. She is actively hallucinating (patient reports auditory hallucinations). She expresses suicidal ideation.  Nursing note and vitals reviewed.  ED Treatments / Results  Labs Results for orders placed or performed during the hospital encounter of 03/30/17  Comprehensive metabolic panel  Result Value Ref Range   Sodium 133 (L) 135 - 145 mmol/L   Potassium 3.6 3.5 - 5.1 mmol/L   Chloride 97 (L) 101 - 111 mmol/L   CO2 25 22 - 32 mmol/L   Glucose, Bld 101 (H) 65 - 99 mg/dL   BUN 10 6 - 20 mg/dL   Creatinine, Ser 4.01 (H) 0.44 - 1.00 mg/dL   Calcium 9.7 8.9 - 02.7 mg/dL   Total Protein 7.8 6.5 - 8.1 g/dL   Albumin 4.3 3.5 - 5.0 g/dL   AST 21 15 - 41 U/L   ALT 18 14 - 54 U/L   Alkaline Phosphatase 109 38 - 126 U/L   Total Bilirubin 0.5 0.3 - 1.2 mg/dL   GFR calc non Af Amer >60 >60 mL/min   GFR calc Af Amer >60 >60 mL/min   Anion gap 11 5 - 15  Ethanol  Result Value Ref Range   Alcohol, Ethyl (B) <10 <10 mg/dL  Salicylate level  Result Value Ref Range   Salicylate Lvl <7.0 2.8 - 30.0 mg/dL  Acetaminophen level  Result Value Ref Range   Acetaminophen (Tylenol), Serum <10 (L) 10 - 30 ug/mL  cbc    Result Value Ref Range   WBC 10.1 4.0 - 10.5 K/uL   RBC 4.75 3.87 - 5.11 MIL/uL   Hemoglobin 13.2 12.0 - 15.0 g/dL   HCT 25.3 66.4 - 40.3 %   MCV 85.3 78.0 - 100.0 fL   MCH 27.8 26.0 - 34.0 pg   MCHC 32.6 30.0 - 36.0 g/dL   RDW 47.4 25.9 - 56.3 %   Platelets 363 150 - 400 K/uL  Rapid urine drug screen (hospital performed)  Result Value Ref Range   Opiates NONE DETECTED NONE DETECTED   Cocaine NONE DETECTED NONE DETECTED   Benzodiazepines NONE DETECTED NONE DETECTED   Amphetamines NONE DETECTED NONE DETECTED   Tetrahydrocannabinol NONE DETECTED NONE DETECTED   Barbiturates NONE DETECTED NONE DETECTED  I-Stat beta hCG blood, ED  Result Value Ref Range   I-stat hCG, quantitative <5.0 <5 mIU/mL   Comment 3           No results found.  EKG  EKG Interpretation None      Radiology No results found.  Procedures .Marland KitchenLaceration Repair Date/Time: 03/30/2017 11:58 AM Performed by: Cherly Anderson, PA-C Authorized by: Cherly Anderson, PA-C   Consent:    Consent obtained:  Verbal   Consent given by:  Patient   Risks discussed:  Infection, pain, poor cosmetic result and poor wound healing   Alternatives discussed:  No treatment Anesthesia (see MAR for exact dosages):    Anesthesia method:  Local infiltration   Local anesthetic:  Lidocaine 2% WITH epi Laceration details:    Location:  Shoulder/arm   Shoulder/arm location:  R lower arm   Length (cm):  3 Repair type:    Repair type:  Simple Pre-procedure details:    Preparation:  Patient was prepped and draped in usual sterile fashion Exploration:    Hemostasis achieved with:  Direct pressure and epinephrine   Wound exploration: wound explored through full range of motion and entire  depth of wound probed and visualized     Contaminated: no   Treatment:    Area cleansed with:  Betadine   Amount of cleaning:  Standard   Irrigation solution:  Sterile saline   Irrigation method:  Syringe Skin repair:    Repair  method:  Sutures   Suture size:  5-0   Suture material:  Fast-absorbing gut   Suture technique:  Simple interrupted   Number of sutures:  6 Approximation:    Approximation:  Close   Vermilion border: well-aligned   Post-procedure details:    Dressing:  Antibiotic ointment and non-adherent dressing   Patient tolerance of procedure:  Tolerated well, no immediate complications   (including critical care time)  Medications Ordered in ED Medications  thiamine (VITAMIN B-1) tablet 100 mg (not administered)    Or  thiamine (B-1) injection 100 mg (not administered)  acetaminophen (TYLENOL) tablet 650 mg (not administered)  zolpidem (AMBIEN) tablet 5 mg (not administered)  ondansetron (ZOFRAN) tablet 4 mg (not administered)  alum & mag hydroxide-simeth (MAALOX/MYLANTA) 200-200-20 MG/5ML suspension 30 mL (not administered)  nicotine (NICODERM CQ - dosed in mg/24 hours) patch 21 mg (not administered)  LORazepam (ATIVAN) injection 0-4 mg (not administered)    Or  LORazepam (ATIVAN) tablet 0-4 mg (not administered)  LORazepam (ATIVAN) tablet 0.5 mg (0.5 mg Oral Given 03/30/17 1045)  lidocaine-EPINEPHrine (XYLOCAINE W/EPI) 2 %-1:200000 (PF) injection 10 mL (10 mLs Infiltration Given 03/30/17 1045)     Initial Impression / Assessment and Plan / ED Course  I have reviewed the triage vital signs and the nursing notes.  Pertinent labs & imaging results that were available during my care of the patient were reviewed by me and considered in my medical decision making (see chart for details).  Patient presents for medical clearance following suicide attempt with laceration and excoriations to the RUE. Irrigation with syringe and sterile water performed. Wound explored and base of wound visualized in a bloodless field without evidence of foreign body.  Laceration occurred < 8 hours prior to repair. Tdap is up to date.  Pt has no comorbidities to effect normal wound healing.  NVI distal to area of injury.  Discussed suture home care with patient and answered questions.   Patient is medically cleared for TTS evaluation. Disposition per behavioral health   Final Clinical Impressions(s) / ED Diagnoses   Final diagnoses:  Suicide attempt [facility])  Laceration of right forearm, initial encounter    ED Discharge Orders    None       Cherly Anderson, PA-C 03/30/17 1203    Rolan Bucco, MD 03/30/17 1313
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
## 2017-03-31 ENCOUNTER — Other Ambulatory Visit: Payer: Self-pay

## 2017-03-31 DIAGNOSIS — Z818 Family history of other mental and behavioral disorders: Secondary | ICD-10-CM

## 2017-03-31 DIAGNOSIS — F250 Schizoaffective disorder, bipolar type: Secondary | ICD-10-CM

## 2017-03-31 DIAGNOSIS — Z560 Unemployment, unspecified: Secondary | ICD-10-CM

## 2017-03-31 DIAGNOSIS — Z811 Family history of alcohol abuse and dependence: Secondary | ICD-10-CM

## 2017-03-31 DIAGNOSIS — I10 Essential (primary) hypertension: Secondary | ICD-10-CM

## 2017-03-31 DIAGNOSIS — R45851 Suicidal ideations: Secondary | ICD-10-CM

## 2017-03-31 DIAGNOSIS — F1910 Other psychoactive substance abuse, uncomplicated: Secondary | ICD-10-CM

## 2017-03-31 LAB — LIPID PANEL
Cholesterol: 201 mg/dL — ABNORMAL HIGH (ref 0–200)
HDL: 42 mg/dL (ref >40)
LDL Cholesterol: 135 mg/dL — ABNORMAL HIGH (ref 0–99)
Total CHOL/HDL Ratio: 4.8 RATIO
Triglycerides: 122 mg/dL (ref <150)
VLDL: 24 mg/dL (ref 0–40)

## 2017-03-31 LAB — TSH: TSH: 4.241 u[IU]/mL (ref 0.350–4.500)

## 2017-03-31 LAB — HEMOGLOBIN A1C
HEMOGLOBIN A1C: 5.4 % (ref 4.8–5.6)
Mean Plasma Glucose: 108.28 mg/dL

## 2017-03-31 MED ORDER — QUETIAPINE FUMARATE 400 MG PO TABS
800.0000 mg | ORAL_TABLET | Freq: Every day | ORAL | Status: DC
  Administered 2017-04-01: 800 mg via ORAL
  Filled 2017-03-31 (×2): qty 2

## 2017-03-31 MED ORDER — LITHIUM CARBONATE 300 MG PO CAPS
900.0000 mg | ORAL_CAPSULE | Freq: Every day | ORAL | Status: DC
  Administered 2017-03-31 – 2017-04-01 (×2): 900 mg via ORAL
  Filled 2017-03-31 (×3): qty 3

## 2017-03-31 MED ORDER — HALOPERIDOL 5 MG PO TABS
5.0000 mg | ORAL_TABLET | Freq: Two times a day (BID) | ORAL | Status: DC
  Administered 2017-03-31 – 2017-04-02 (×4): 5 mg via ORAL
  Filled 2017-03-31 (×6): qty 1

## 2017-03-31 MED ORDER — QUETIAPINE FUMARATE 300 MG PO TABS
600.0000 mg | ORAL_TABLET | Freq: Every day | ORAL | Status: AC
  Administered 2017-03-31: 600 mg via ORAL
  Filled 2017-03-31: qty 2

## 2017-03-31 NOTE — Progress Notes (Signed)
NUTRITION ASSESSMENT  Pt identified as at risk on the Malnutrition Screen Tool  INTERVENTION: 1. Educated patient on the importance of nutrition and encouraged intake of food and beverages. 2. Discussed weight goals. 3. Supplements: none at this time.   NUTRITION DIAGNOSIS: Unintentional weight loss related to sub-optimal intake as evidenced by pt report.   Goal: Pt to meet >/= 90% of their estimated nutrition needs.  Monitor:  PO intake  Assessment:  Pt admitted for SI with auditory hallucinations which have told her to kill herself. Pt with depression and anxiety and was found to have superficial cuts requiring sutures on admission. Pt has hx of bipolar, schizophrenia, anxiety, and depression.   Pt reports 30 lb weight loss in the past 3 months. Per chart review, current weight is consistent with weight from April. Weight then up 38 lbs from 4/30-9/9 and then back down to current weight on 9/27.  Will hold off on ordering oral nutrition supplements at this time. Continue to encourage PO intakes of meals and snacks.     42 y.o. female  Height: Ht Readings from Last 1 Encounters:  03/30/17 5\' 7"  (1.702 m)    Weight: Wt Readings from Last 1 Encounters:  03/30/17 198 lb (89.8 kg)    Weight Hx: Wt Readings from Last 10 Encounters:  03/30/17 198 lb (89.8 kg)  03/28/17 200 lb (90.7 kg)  01/13/17 200 lb (90.7 kg)  12/19/16 200 lb (90.7 kg)  12/12/16 238 lb (108 kg)  12/01/16 238 lb 5.1 oz (108.1 kg)  07/22/16 200 lb (90.7 kg)  06/20/16 225 lb (102.1 kg)  05/05/16 214 lb (97.1 kg)  05/02/16 223 lb (101.2 kg)    BMI:  Body mass index is 31.01 kg/m. Pt meets criteria for obesity based on current BMI.  Estimated Nutritional Needs: Kcal: 25-30 kcal/kg Protein: > 1 gram protein/kg Fluid: 1 ml/kcal  Diet Order: Diet regular Room service appropriate? Yes; Fluid consistency: Thin Pt is also offered choice of unit snacks mid-morning and mid-afternoon.  Pt is eating as  desired.   Lab results and medications reviewed.     Trenton GammonJessica Riot Waterworth, MS, RD, LDN, [facility] Inpatient Clinical Dietitian Pager # [phone] After hours/weekend pager # [phone]

## 2017-03-31 NOTE — Progress Notes (Signed)
D: Pt presents with a flat affect and depressed mood. Pt reports feeling increasingly depressed and anxious today. Pt endorses SI with a plan to overdose on pills. Pt endorses command voices telling her to kill herself. Pt verbalized understanding of contracting for safety and agreed to contract for safety. Pt verbalized feeling paranoid like someone is trying to hurt her.  A: Medications reviewed with pt. Medications administered as ordered per MD. Verbal support provided. Pt encouraged to attend groups. 15 minute checks performed for safety.  R: Pt compliant with tx plan.

## 2017-03-31 NOTE — Progress Notes (Signed)
D: Pt was in bed in her room upon initial approach.  Pt presents with depressed affect and mood.  Pt reports "I don't feel good, still hearing voices."  Pt reports having command AH to "kill myself."  Developed plan with pt to "be safe and sleep better" tonight.  Pt denies HI, denies visual hallucinations, reports R arm pain of 6/10.  Pt has dressing to R forearm and writer asked to change it, pt declined and reported she will have it changed tomorrow.  She isolated to her room for the majority of the night.  Pt did not attend evening group.  A: Introduced self to pt.  Actively listened to pt and offered support and encouragement. Medications administered per order.  PRN medication administered for pain.  Q15 minute safety checks maintained.  R: Pt is safe on the unit.  Pt is compliant with medications.  Pt verbally contracts for safety and reports she will inform staff of needs and concerns.  Will continue to monitor and assess.

## 2017-03-31 NOTE — Progress Notes (Addendum)
Patient is a 42 yr old female voluntarily admitted after leaving ED on yesterday she returned with superficial cuts to her right forearm which required sutures. She c/o SI with voices telling her to kill herself. She also reports increased depression and anxiety. She reported SI to jump in front of a car or OD on medication.She denies HI but also reports visual hallucinations and passive si and verbally contract for safety, She reports recent medication changes and thinks this may be causing her to be sick. She was anxious and tearful during admission process. Writer assessed sutures and they were intact. Medical hx include HTN, SVT, Bipolar d/o, schizoaffective d/o, anxiety and depression. She was oriented to unit offered meal and fluids. Medications to be given once admission completed. Safety maintained on unit with 15 min checks.

## 2017-03-31 NOTE — [facility] Group Notes (Signed)
[facility] LCSW Group Therapy Note  Date/Time: 03/31/17, 1315  Type of Therapy and Topic:  Group Therapy:  Overcoming Obstacles  Participation Level:  Did not attend  Description of Group:    In this group patients will be encouraged to explore what they see as obstacles to their own wellness and recovery. They will be guided to discuss their thoughts, feelings, and behaviors related to these obstacles. The group will process together ways to cope with barriers, with attention given to specific choices patients can make. Each patient will be challenged to identify changes they are motivated to make in order to overcome their obstacles. This group will be process-oriented, with patients participating in exploration of their own experiences as well as giving and receiving support and challenge from other group members.  Therapeutic Goals: 1. Patient will identify personal and current obstacles as they relate to admission. 2. Patient will identify barriers that currently interfere with their wellness or overcoming obstacles.  3. Patient will identify feelings, thought process and behaviors related to these barriers. 4. Patient will identify two changes they are willing to make to overcome these obstacles:    Summary of Patient Progress      Therapeutic Modalities:   Cognitive Behavioral Therapy Solution Focused Therapy Motivational Interviewing Relapse Prevention Therapy  Greg Durene Dodge, LCSW 
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
## 2017-03-31 NOTE — Tx Team (Signed)
Interdisciplinary Treatment and Diagnostic Plan Update  03/31/2017 Time of Session: 1441 Megan Edwards MRN: [id number]  Principal Diagnosis: <principal problem not specified>  Secondary Diagnoses: Active Problems:   Schizoaffective disorder (HCC)   Current Medications:  Current Facility-Administered Medications  Medication Dose Route Frequency Provider Last Rate Last Dose  . acetaminophen (TYLENOL) tablet 650 mg  650 mg Oral Q6H PRN [name], NP      . alum & mag hydroxide-simeth (MAALOX/MYLANTA) 200-200-20 MG/5ML suspension 30 mL  30 mL Oral Q4H PRN [name], NP      . haloperidol (HALDOL) tablet 5 mg  5 mg Oral BID [name], [name], MD      . hydrOXYzine (ATARAX/VISTARIL) tablet 25 mg  25 mg Oral TID PRN [name], NP      . labetalol (NORMODYNE) tablet 100 mg  100 mg Oral TID [name], NP   100 mg at 03/31/17 1157  . lithium carbonate capsule 900 mg  900 mg Oral QHS [name], [name], MD      . magnesium hydroxide (MILK OF MAGNESIA) suspension 30 mL  30 mL Oral Daily PRN [name], NP      . QUEtiapine (SEROQUEL) tablet 600 mg  600 mg Oral QHS [name], [name], MD      . Derrill Memo ON 04/01/2017] QUEtiapine (SEROQUEL) tablet 800 mg  800 mg Oral QHS [name], [name], MD      . rivaroxaban (XARELTO) tablet 20 mg  20 mg Oral Daily [name], NP   20 mg at 03/31/17 0956  . traZODone (DESYREL) tablet 300 mg  300 mg Oral QHS PRN [name], NP   300 mg at 03/30/17 2354   PTA Medications: Medications Prior to Admission  Medication Sig Dispense Refill Last Dose  . busPIRone (BUSPAR) 15 MG tablet Take 15 mg by mouth 2 (two) times daily.  2 03/30/2017 at Unknown time  . gabapentin (NEURONTIN) 300 MG capsule Take 1 capsule (300 mg total) by mouth 3 (three) times daily. 90 capsule 0 Past Week at Unknown time  . labetalol (NORMODYNE) 100 MG tablet Take 1 tablet (100 mg) in the morning & 2 tablets (200 mg) in the evening: For high blood pressure (Patient  taking differently: Take 100 mg by mouth 3 (three) times daily. ) 21 tablet 0 Past Week at Unknown time  . lamoTRIgine (LAMICTAL) 25 MG tablet Take 25 mg by mouth daily.  2 03/29/2017 at Unknown time  . lithium carbonate 300 MG capsule Take 3 capsules (900 mg total) by mouth at bedtime. For mood stabilization 90 capsule 0 03/29/2017 at Unknown time  . PARoxetine (PAXIL) 20 MG tablet Take 1 tablet (20 mg total) by mouth daily. 30 tablet 0 03/30/2017 at Unknown time  . QUEtiapine (SEROQUEL) 300 MG tablet Take 300 mg by mouth 2 (two) times daily.  2 Past Week at Unknown time  . trazodone (DESYREL) 300 MG tablet Take 300 mg by mouth at bedtime.   03/29/2017 at Unknown time  . XARELTO 20 MG TABS tablet Take 20 mg by mouth daily.  0 03/30/2017 at 9AM  . ELIQUIS STARTER PACK (ELIQUIS STARTER PACK) 5 MG TABS Take as directed on package: start with two-'5mg'$  tablets twice daily for 7 days. On day 8, switch to one-'5mg'$  tablet twice daily. (Patient not taking: Reported on 03/28/2017) 1 each 0 Unknown at Unknown time  . gabapentin (NEURONTIN) 100 MG capsule Take 200 mg by mouth at  bedtime.  2 Unknown at Unknown time  . paliperidone (INVEGA) 9 MG 24 hr tablet Take 1 tablet (9 mg total) by mouth at bedtime. For mood control (Patient not taking: Reported on 03/30/2017) 60 tablet 0 Not Taking at Unknown time  . prazosin (MINIPRESS) 2 MG capsule Take 1 capsule (2 mg total) by mouth at bedtime. For nightmares (Patient not taking: Reported on 03/30/2017) 30 capsule 0 Not Taking at Unknown time    Patient Stressors: Health problems Medication change or noncompliance  Patient Strengths: Average or above average intelligence Motivation for treatment/growth  Treatment Modalities: Medication Management, Group therapy, Case management,  1 to 1 session with clinician, Psychoeducation, Recreational therapy.   Physician Treatment Plan for Primary Diagnosis: <principal problem not specified> Long Term Goal(s):     Short Term Goals:     Medication Management: Evaluate patient's response, side effects, and tolerance of medication regimen.  Therapeutic Interventions: 1 to 1 sessions, Unit Group sessions and Medication administration.  Evaluation of Outcomes: Not Met  Physician Treatment Plan for Secondary Diagnosis: Active Problems:   Schizoaffective disorder ([place])  Long Term Goal(s):     Short Term Goals:       Medication Management: Evaluate patient's response, side effects, and tolerance of medication regimen.  Therapeutic Interventions: 1 to 1 sessions, Unit Group sessions and Medication administration.  Evaluation of Outcomes: Not Met   RN Treatment Plan for Primary Diagnosis: <principal problem not specified> Long Term Goal(s): Knowledge of disease and therapeutic regimen to maintain health will improve  Short Term Goals: Ability to identify and develop effective coping behaviors will improve and Compliance with prescribed medications will improve  Medication Management: RN will administer medications as ordered by provider, will assess and evaluate patient's response and provide education to patient for prescribed medication. RN will report any adverse and/or side effects to prescribing provider.  Therapeutic Interventions: 1 on 1 counseling sessions, Psychoeducation, Medication administration, Evaluate responses to treatment, Monitor vital signs and CBGs as ordered, Perform/monitor CIWA, COWS, AIMS and Fall Risk screenings as ordered, Perform wound care treatments as ordered.  Evaluation of Outcomes: Not Met   LCSW Treatment Plan for Primary Diagnosis: <principal problem not specified> Long Term Goal(s): Safe transition to appropriate next level of care at discharge, Engage patient in therapeutic group addressing interpersonal concerns.  Short Term Goals: Engage patient in aftercare planning with referrals and resources, Increase social support and Increase skills for wellness and recovery  Therapeutic  Interventions: Assess for all discharge needs, 1 to 1 time with Social worker, Explore available resources and support systems, Assess for adequacy in community support network, Educate family and significant other(s) on suicide prevention, Complete Psychosocial Assessment, Interpersonal group therapy.  Evaluation of Outcomes: Not Met   Progress in Treatment: Attending groups: No. Participating in groups: No. Taking medication as prescribed: Yes. Toleration medication: Yes. Family/Significant other contact made: No, will contact:  pt declined Patient understands diagnosis: Yes. Discussing patient identified problems/goals with staff: Yes. Medical problems stabilized or resolved: Yes. Denies suicidal/homicidal ideation: Yes. Issues/concerns per patient self-inventory: Yes. Other: none  New problem(s) identified: No, Describe:  none  New Short Term/Long Term Goal(s):  Discharge Plan or Barriers:   Reason for Continuation of Hospitalization: Depression Hallucinations Medication stabilization  Estimated Length of Stay: 3-5 days.  Attendees: Patient: 03/31/2017   Physician: Dr Sanjuana Letters, MD 03/31/2017   Nursing: Darrol Angel, RN 03/31/2017   RN Care Manager: 03/31/2017   Social Worker: Lurline Idol, LCSW 03/31/2017   Recreational Therapist:  03/31/2017   Other:  03/31/2017   Other:  03/31/2017   Other: 03/31/2017     Scribe for Treatment Team: Joanne Chars, Joanna 03/31/2017 2:41 PM

## 2017-03-31 NOTE — Tx Team (Signed)
Initial Treatment Plan 03/31/2017 12:45 AM Megan Edwards [id number]    PATIENT STRESSORS: Health problems Medication change or noncompliance   PATIENT STRENGTHS: Average or above average intelligence Motivation for treatment/growth   PATIENT IDENTIFIED PROBLEMS: SI  Depression  Anxiety  Medication change which has patient feeling sick  Auditory hallucinations             DISCHARGE CRITERIA:  Improved stabilization in mood, thinking, and/or behavior  PRELIMINARY DISCHARGE PLAN: Return to previous living arrangement  PATIENT/FAMILY INVOLVEMENT: This treatment plan has been presented to and reviewed with the patient, Megan Edwards, and/or family member. The patient and family have been given the opportunity to ask questions and make suggestions.  Floyce StakesGarrison, Jennavie Martinek, RN 03/31/2017, 12:45 AM

## 2017-03-31 NOTE — Progress Notes (Signed)
Recreation Therapy Notes  Date: 03/31/17 Time: 0930 Location: [address] Dayroom  Group Topic: Stress Management  Goal Area(s) Addresses:  Patient will verbalize importance of using healthy stress management.  Patient will identify positive emotions associated with healthy stress management.   Intervention: Stress Management  Activity :  LRT introduced the stress management technique of meditation.  LRT played a meditation to help patients deal with anxiety.  Patients were to follow along to fully engage in the meditation.  Education:  Stress Management, Discharge Planning.   Education Outcome: Acknowledges edcuation/In group clarification offered/Needs additional education  Clinical Observations/Feedback: Pt did not attend group.    Caroll RancherMarjette Junell Cullifer, LRT/CTRS         Lillia AbedLindsay, Annelise Mccoy A 03/31/2017 11:15 AM

## 2017-03-31 NOTE — H&P (Signed)
Psychiatric Admission Assessment Adult  Patient Identification: Megan Edwards MRN:  [id number] Date of Evaluation:  03/31/2017 Chief Complaint:  Worsening psychosis and mania Principal Diagnosis: Schizoaffective disorder Diagnosis:   Patient Active Problem List   Diagnosis Date Noted  . Schizoaffective disorder (HCC) [F25.9] 03/30/2017  . Adjustment disorder with disturbance of emotion [F43.29] 03/29/2017  . Alcohol abuse with alcohol-induced mood disorder (HCC) [F10.14] 01/18/2017  . Anxiety and depression [F41.9, F32.9] 01/14/2017  . Pulmonary embolism (HCC) [I26.99] 12/19/2016  . Alcohol withdrawal (HCC) [F10.239] 12/01/2016  . ADD (attention deficit disorder) [F98.8] 05/10/2016  . Opioid use disorder, mild, in controlled environment (HCC) [F11.10] 05/06/2016  . Essential hypertension [I10] 05/06/2016  . Alcohol use disorder, moderate, in early remission, dependence (HCC) [F10.21] 05/06/2016  . Bipolar I disorder, most recent episode manic, severe with psychotic features (HCC) [F31.2] 01/20/2016  . Chronic pain syndrome [G89.4]    History of Present Illness:   42 y.o Caucasian female, divorced, unemployed, recently discharged from [facility], lives with his ex-husband and their two minor kids. Background history of Schizoaffective disorder and SUD. Presented to the ER with a lot of somatic complains. She was recently detoxed from Suboxone after being on it for ten years. Patient is reported to be having visual and auditory hallucinations. She is getting command to harm herself. She had expressed paranoia. She has not been sleeping well.  Routine labs shows slightly low sodium and low Lithium levels. No substances or alcohol on board.  Staff reports that she distressed last night. She required emergency medications.  At interview, patient reports hearing two voices. They are giving her commands to end her life. They say derogatory things about her " no one loves you ,,,, jump off the  bridge ". Patient feels everyone is working with her ex-husband. Feels he is out to get her. Says her mental illness has been used to limit her contact with her kids. She has not been eating much as she was worried he is trying to poison her. She also feels he might be messing with her medication. Patient says she had seen people and animals in his car which he could not see. Says she has been staying up at night as she is very scared and paranoid. She is scared she might harm herself. She denies any thoughts of harming her ex-husband or her kids. She has not used any substances since she went into rehab. She denies any other stressors. She does not have access to weapons.  Total Time spent with patient: 1 hour  Past Psychiatric History: Long history of Schizoaffective disorder and SUD. She was last stabilized here about a year ago. Patient says her medications has been switched a lot. Says more and more medications has been added lately. Notes that her mood has become more labile with multiple medications. She was on Paxil, Atomoxetine, Buspar, Lithium and Seroquel. Patient notes that she responded well to Haldol and Chlorpromazine in the past. No past suicidal behavior. No past history of violent behavior.   Is the patient at risk to self? Yes.    Has the patient been a risk to self in the past 6 months? No.  Has the patient been a risk to self within the distant past? No.  Is the patient a risk to others? No.  Has the patient been a risk to others in the past 6 months? No.  Has the patient been a risk to others within the distant past? No.   Prior Inpatient  Therapy:   Prior Outpatient Therapy:    Alcohol Screening: 1. How often do you have a drink containing alcohol?: Never 2. How many drinks containing alcohol do you have on a typical day when you are drinking?: 1 or 2 3. How often do you have six or more drinks on one occasion?: Never AUDIT-C Score: 0 Intervention/Follow-up: AUDIT Score <7  follow-up not indicated Substance Abuse History in the last 12 months:  Yes.   Consequences of Substance Abuse: As above  Previous Psychotropic Medications: Yes  Psychological Evaluations: Yes  Past Medical History:  Past Medical History:  Diagnosis Date  . Abnormal Pap smear   . Alcohol abuse   . Anxiety and depression 01/14/2017  . Bipolar 1 disorder (HCC)   . Depression   . Genital herpes 2000  . Hypertension   . Infection    UTI  . Ovarian cyst   . Schizoaffective disorder (HCC) 05/05/2016  . SVT (supraventricular tachycardia) (HCC)     Past Surgical History:  Procedure Laterality Date  . addenoidectomy    . BREAST ENHANCEMENT SURGERY    . BREAST SURGERY    . KNEE SURGERY    . KNEE SURGERY Left   . TONSILLECTOMY    . WISDOM TOOTH EXTRACTION     Family History:  Family History  Problem Relation Age of Onset  . Alcoholism Unknown   . Leukemia Unknown   . Cervical cancer Unknown   . Bone cancer Unknown   . Cirrhosis Unknown   . Diabetes Mellitus II Unknown   . Depression Unknown   . Hypertension Unknown   . Thyroid cancer Unknown   . Diabetes Mellitus II Mother   . Hypertension Mother   . Alcoholism Father   . Mental illness Paternal Grandmother    Family Psychiatric  History: Grandmother had schizophrenia Tobacco Screening: Have you used any form of tobacco in the last 30 days? (Cigarettes, Smokeless Tobacco, Cigars, and/or Pipes): No Social History:  Social History   Substance and Sexual Activity  Alcohol Use Yes  . Alcohol/week: 0.0 oz   Comment: states has not drank alcohol since 12/19/16     Social History   Substance and Sexual Activity  Drug Use Yes  . Types: Methamphetamines   Comment: former-opiate use    Additional Social History:   Allergies:   Allergies  Allergen Reactions  . Penicillins Rash and Other (See Comments)    CHILDHOOD ALLERGY Has patient had a PCN reaction causing immediate rash, facial/tongue/throat swelling, SOB or  lightheadedness with hypotension: Yes Has patient had a PCN reaction causing severe rash involving mucus membranes or skin necrosis: No Has patient had a PCN reaction that required hospitalization No Has patient had a PCN reaction occurring within the last 10 years: No If all of the above answers are "NO", then may proceed with Cephalosporin use.   Lab Results:  Results for orders placed or performed during the hospital encounter of 03/30/17 (from the past 48 hour(s))  Hemoglobin A1c     Status: None   Collection Time: 03/31/17  6:28 AM  Result Value Ref Range   Hgb A1c MFr Bld 5.4 4.8 - 5.6 %    Comment: (NOTE) Pre diabetes:          5.7%-6.4% Diabetes:              >6.4% Glycemic control for   <7.0% adults with diabetes    Mean Plasma Glucose 108.28 mg/dL    Comment: Performed at  [facility], [address] [place], [place] [address]  Lipid panel     Status: Abnormal   Collection Time: 03/31/17  6:28 AM  Result Value Ref Range   Cholesterol 201 (H) 0 - 200 mg/dL   Triglycerides 147 <829 mg/dL   HDL 42 >56 mg/dL   Total CHOL/HDL Ratio 4.8 RATIO   VLDL 24 0 - 40 mg/dL   LDL Cholesterol 213 (H) 0 - 99 mg/dL    Comment:        Total Cholesterol/HDL:CHD Risk Coronary Heart Disease Risk Table                     Men   Women  1/2 Average Risk   3.4   3.3  Average Risk       5.0   4.4  2 X Average Risk   9.6   7.1  3 X Average Risk  23.4   11.0        Use the calculated Patient Ratio above and the CHD Risk Table to determine the patient's CHD Risk.        ATP III CLASSIFICATION (LDL):  <100     mg/dL   Optimal  086-578  mg/dL   Near or Above                    Optimal  130-159  mg/dL   Borderline  469-629  mg/dL   High  >528     mg/dL   Very High Performed at [facility], [address] [place], [place] [address]   TSH     Status: None   Collection Time: 03/31/17  6:28 AM  Result Value Ref Range   TSH 4.241 0.350 - 4.500 uIU/mL    Comment:  Performed by a 3rd Generation assay with a functional sensitivity of <=0.01 uIU/mL. Performed at [facility], [address] [place], [place] [address]     Blood Alcohol level:  Lab Results  Component Value Date   ETH <10 03/30/2017   ETH <10 03/28/2017    Metabolic Disorder Labs:  Lab Results  Component Value Date   HGBA1C 5.4 03/31/2017   MPG 108.28 03/31/2017   MPG 108 05/07/2016   Lab Results  Component Value Date   PROLACTIN 86.9 (H) 05/07/2016   PROLACTIN 19.4 05/06/2016   Lab Results  Component Value Date   CHOL 201 (H) 03/31/2017   TRIG 122 03/31/2017   HDL 42 03/31/2017   CHOLHDL 4.8 03/31/2017   VLDL 24 03/31/2017   LDLCALC 135 (H) 03/31/2017   LDLCALC 96 05/06/2016    Current Medications: Current Facility-Administered Medications  Medication Dose Route Frequency Provider Last Rate Last Dose  . acetaminophen (TYLENOL) tablet 650 mg  650 mg Oral Q6H PRN Jackelyn Poling, NP      . alum & mag hydroxide-simeth (MAALOX/MYLANTA) 200-200-20 MG/5ML suspension 30 mL  30 mL Oral Q4H PRN Nira Conn A, NP      . hydrOXYzine (ATARAX/VISTARIL) tablet 25 mg  25 mg Oral TID PRN Nira Conn A, NP      . labetalol (NORMODYNE) tablet 100 mg  100 mg Oral TID Nira Conn A, NP   100 mg at 03/31/17 1157  . magnesium hydroxide (MILK OF MAGNESIA) suspension 30 mL  30 mL Oral Daily PRN Nira Conn A, NP      . QUEtiapine (SEROQUEL) tablet 300 mg  300 mg Oral BID  Nira Conn A, NP   300 mg at 03/31/17 0955  . rivaroxaban (XARELTO) tablet 20 mg  20 mg Oral Daily Nira Conn A, NP   20 mg at 03/31/17 0956  . traZODone (DESYREL) tablet 300 mg  300 mg Oral QHS PRN Nira Conn A, NP   300 mg at 03/30/17 2354   PTA Medications: Medications Prior to Admission  Medication Sig Dispense Refill Last Dose  . busPIRone (BUSPAR) 15 MG tablet Take 15 mg by mouth 2 (two) times daily.  2 03/30/2017 at Unknown time  . gabapentin (NEURONTIN) 300 MG capsule Take 1 capsule  (300 mg total) by mouth 3 (three) times daily. 90 capsule 0 Past Week at Unknown time  . labetalol (NORMODYNE) 100 MG tablet Take 1 tablet (100 mg) in the morning & 2 tablets (200 mg) in the evening: For high blood pressure (Patient taking differently: Take 100 mg by mouth 3 (three) times daily. ) 21 tablet 0 Past Week at Unknown time  . lamoTRIgine (LAMICTAL) 25 MG tablet Take 25 mg by mouth daily.  2 03/29/2017 at Unknown time  . lithium carbonate 300 MG capsule Take 3 capsules (900 mg total) by mouth at bedtime. For mood stabilization 90 capsule 0 03/29/2017 at Unknown time  . PARoxetine (PAXIL) 20 MG tablet Take 1 tablet (20 mg total) by mouth daily. 30 tablet 0 03/30/2017 at Unknown time  . QUEtiapine (SEROQUEL) 300 MG tablet Take 300 mg by mouth 2 (two) times daily.  2 Past Week at Unknown time  . trazodone (DESYREL) 300 MG tablet Take 300 mg by mouth at bedtime.   03/29/2017 at Unknown time  . XARELTO 20 MG TABS tablet Take 20 mg by mouth daily.  0 03/30/2017 at 9AM  . ELIQUIS STARTER PACK (ELIQUIS STARTER PACK) 5 MG TABS Take as directed on package: start with two-5mg  tablets twice daily for 7 days. On day 8, switch to one-5mg  tablet twice daily. (Patient not taking: Reported on 03/28/2017) 1 each 0 Unknown at Unknown time  . gabapentin (NEURONTIN) 100 MG capsule Take 200 mg by mouth at bedtime.  2 Unknown at Unknown time  . paliperidone (INVEGA) 9 MG 24 hr tablet Take 1 tablet (9 mg total) by mouth at bedtime. For mood control (Patient not taking: Reported on 03/30/2017) 60 tablet 0 Not Taking at Unknown time  . prazosin (MINIPRESS) 2 MG capsule Take 1 capsule (2 mg total) by mouth at bedtime. For nightmares (Patient not taking: Reported on 03/30/2017) 30 capsule 0 Not Taking at Unknown time    Musculoskeletal: Strength & Muscle Tone: within normal limits Gait & Station: normal Patient leans: N/A  Psychiatric Specialty Exam: Physical Exam  Constitutional: She appears well-developed and well-nourished.   HENT:  Head: Normocephalic and atraumatic.  Respiratory: Effort normal.  Neurological: She is alert.  Psychiatric:  As above    ROS  Blood pressure (!) 126/91, pulse 98, temperature 98.3 F (36.8 C), temperature source Oral, resp. rate 16, height 5\' 7"  (1.702 m), weight 89.8 kg (198 lb), not currently breastfeeding.Body mass index is 31.01 kg/m.  General Appearance: In hospital clothing. Was alone in her room just prior to interview. Internally distressed.   Eye Contact:  Moderate. Blinking excessively.   Speech:  Not pressured.   Volume:  Normal  Mood:  Overwhelmed by his subjective experience  Affect:  Blunted and mood congruent  Thought Process:  Linear  Orientation:  Full (Time, Place, and Person)  Thought Content:  Persecutory and  paranoid delusion. Auditory and visual hallucinations.   Suicidal Thoughts:  Yes.  without intent/plan  Homicidal Thoughts:  No  Memory:  Immediate;   Good Recent;   Fair Remote;   Fair  Judgement:  Fair  Insight:  Good  Psychomotor Activity:  Decreased  Concentration:  Concentration: Fair and Attention Span: Fair  Recall:  [employer] of Knowledge:  Fair  Language:  Good  Akathisia:  Negative  Handed:    AIMS (if indicated):     Assets:  Communication Skills Desire for Improvement Physical Health Resilience  ADL's:  Impaired  Cognition:  WNL  Sleep:  Number of Hours: 5.5    Treatment Plan Summary: Patient is actively psychotic. She is getting commands to end her life. She is on multiple psychotropic medications. We have agreed to wean her off antidepressants and optimize her antipsychotic agents/mood stabilizers. Plan as belowe was reviewed with her. She consented respectively.   Psychiatric: Schizoaffective disorder SUD  Medical: HTN  Psychosocial:  No income Limited support  PLAN: 1. Hold Paxil, Atomoxetine and Buspirone as they have antidepressant properties. Would likely destabilize her mod further. 2. Increase  Seroquel from 300 BID to 800 mg at bedtime only. 3. Continue Lithium 900 mg at bedtime. She was likely not fully adherent at home as she felt her ex-husband was messing with her medications 4. Add Haldol 5 mg BID 5. Continue home medical medications at home dose 6. Encourage unit groups and therapeutic activities 7. Monitor mood, behavior and interaction with peers 8. SW would gather collateral from her family and coordinate aftercare  Observation Level/Precautions:  15 minute checks  Laboratory:  Lithium levels in five days, follow BMP  Psychotherapy:    Medications:    Consultations:    Discharge Concerns:    Estimated LOS:  Other:     Physician Treatment Plan for Primary Diagnosis: <principal problem not specified> Long Term Goal(s): Improvement in symptoms so as ready for discharge  Short Term Goals: Ability to identify changes in lifestyle to reduce recurrence of condition will improve, Ability to verbalize feelings will improve, Ability to disclose and discuss suicidal ideas, Ability to demonstrate self-control will improve, Ability to identify and develop effective coping behaviors will improve, Ability to maintain clinical measurements within normal limits will improve and Compliance with prescribed medications will improve  Physician Treatment Plan for Secondary Diagnosis: Active Problems:   Schizoaffective disorder (HCC)  Long Term Goal(s): Improvement in symptoms so as ready for discharge  Short Term Goals: Ability to identify changes in lifestyle to reduce recurrence of condition will improve, Ability to verbalize feelings will improve, Ability to disclose and discuss suicidal ideas, Ability to demonstrate self-control will improve, Ability to identify and develop effective coping behaviors will improve, Ability to maintain clinical measurements within normal limits will improve and Compliance with prescribed medications will improve  I certify that inpatient services  furnished can reasonably be expected to improve the patient's condition.    Georgiann Cocker, MD 1/7/20192:13 PM

## 2017-03-31 NOTE — [facility] Suicide Risk Assessment (Signed)
[facility] INPATIENT:  Family/Significant Other Suicide Prevention Education  Suicide Prevention Education:  Patient Refusal for Family/Significant Other Suicide Prevention Education: The patient Myrla HalstedKelly L Heidecker has refused to provide written consent for family/significant other to be provided Family/Significant Other Suicide Prevention Education during admission and/or prior to discharge.  Physician notified.  Ida RogueRodney B Albie Arizpe 03/31/2017, 5:30 PM
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
## 2017-04-01 DIAGNOSIS — R450 Nervousness: Secondary | ICD-10-CM

## 2017-04-01 DIAGNOSIS — F39 Unspecified mood [affective] disorder: Secondary | ICD-10-CM

## 2017-04-01 DIAGNOSIS — F419 Anxiety disorder, unspecified: Secondary | ICD-10-CM

## 2017-04-01 MED ORDER — DIPHENHYDRAMINE HCL 50 MG PO CAPS
50.0000 mg | ORAL_CAPSULE | Freq: Once | ORAL | Status: AC
  Administered 2017-04-01: 50 mg via ORAL
  Filled 2017-04-01: qty 2
  Filled 2017-04-01: qty 1

## 2017-04-01 MED ORDER — BENZTROPINE MESYLATE 0.5 MG PO TABS
0.5000 mg | ORAL_TABLET | Freq: Every day | ORAL | Status: DC
  Administered 2017-04-01 – 2017-04-04 (×3): 0.5 mg via ORAL
  Filled 2017-04-01 (×6): qty 1

## 2017-04-01 NOTE — Progress Notes (Signed)
Recreation Therapy Notes  Animal-Assisted Activity (AAA) Program Checklist/Progress Notes Patient Eligibility Criteria Checklist & Daily Group note for Rec TxIntervention  Date: 01.18.2018 Time: 2:45pm Location: [address] Dayroom   AAA/T Program Assumption of Risk Form signed by Patient/ or Parent Legal Guardian Yes  Patient is free of allergies or sever asthma  Yes  Patient reports no fear of animals Yes  Patient reports no history of cruelty to animals Yes   Patient understands his/her participation is voluntary Yes  Patient washes hands before animal contact Yes  Patient washes hands after animal contact Yes  Behavioral Response: Appropriate, Attentive   Education:Hand Washing, Appropriate Animal Interaction   Education Outcome: Acknowledges education.   Clinical Observations/Feedback: Patient attended session and interacted appropriately with therapy dog and peers.   Alejah Aristizabal L Coretha Creswell, LRT/CTRS        Xandra Laramee L 04/01/2017 2:59 PM 

## 2017-04-01 NOTE — Plan of Care (Signed)
Pt continues to report hallucinations and increased anxiety

## 2017-04-01 NOTE — [facility] Group Notes (Signed)
Pt did not attend wrap up group, pt stayed in bed.

## 2017-04-01 NOTE — Progress Notes (Signed)
[facility]  04/01/2017 12:48 PM Megan HalstedKelly L Eskew  MRN:  [id number]   Subjective:  Patient reports that she didn't feel like she slept well last night. Sh estates AH of chattering and distinct voices telling her to kill herself and her children are being taken away. She reports having VH of shadow figures at times. She reports still feeling depressed and paranoid, but everything has improved compared to admission. She contracts for safety.  Objective: Patient's chart and findings reviewed and discussed with treatment team. Patient presents in her bed and is pleasant and cooperative. She was just started on her increased medications yesterday and will allow her at least another day before any titration is done. Will continue current medications. She has been in the day room and has interacted appropriately so far.   Principal Problem: Schizoaffective disorder (HCC) Diagnosis:   Patient Active Problem List   Diagnosis Date Noted  . Schizoaffective disorder (HCC) [F25.9] 03/30/2017  . Adjustment disorder with disturbance of emotion [F43.29] 03/29/2017  . Alcohol abuse with alcohol-induced mood disorder (HCC) [F10.14] 01/18/2017  . Anxiety and depression [F41.9, F32.9] 01/14/2017  . Pulmonary embolism (HCC) [I26.99] 12/19/2016  . Alcohol withdrawal (HCC) [F10.239] 12/01/2016  . ADD (attention deficit disorder) [F98.8] 05/10/2016  . Opioid use disorder, mild, in controlled environment (HCC) [F11.10] 05/06/2016  . Essential hypertension [I10] 05/06/2016  . Alcohol use disorder, moderate, in early remission, dependence (HCC) [F10.21] 05/06/2016  . Bipolar I disorder, most recent episode manic, severe with psychotic features (HCC) [F31.2] 01/20/2016  . Chronic pain syndrome [G89.4]    Total Time spent with patient: 15 minutes  Past Psychiatric History: See H&P  Past Medical History:  Past Medical History:  Diagnosis Date  . Abnormal Pap smear   . Alcohol abuse   . Anxiety and  depression 01/14/2017  . Bipolar 1 disorder (HCC)   . Depression   . Genital herpes 2000  . Hypertension   . Infection    UTI  . Ovarian cyst   . Schizoaffective disorder (HCC) 05/05/2016  . SVT (supraventricular tachycardia) (HCC)     Past Surgical History:  Procedure Laterality Date  . addenoidectomy    . BREAST ENHANCEMENT SURGERY    . BREAST SURGERY    . KNEE SURGERY    . KNEE SURGERY Left   . TONSILLECTOMY    . WISDOM TOOTH EXTRACTION     Family History:  Family History  Problem Relation Age of Onset  . Alcoholism Unknown   . Leukemia Unknown   . Cervical cancer Unknown   . Bone cancer Unknown   . Cirrhosis Unknown   . Diabetes Mellitus II Unknown   . Depression Unknown   . Hypertension Unknown   . Thyroid cancer Unknown   . Diabetes Mellitus II Mother   . Hypertension Mother   . Alcoholism Father   . Mental illness Paternal Grandmother    Family Psychiatric  History: See H&P Social History:  Social History   Substance and Sexual Activity  Alcohol Use Yes  . Alcohol/week: 0.0 oz   Comment: states has not drank alcohol since 12/19/16     Social History   Substance and Sexual Activity  Drug Use Yes  . Types: Methamphetamines   Comment: former-opiate use    Social History   Socioeconomic History  . Marital status: Divorced    Spouse name: Not on file  . Number of children: Not on file  . Years of education: Not on file  .  Highest education level: Not on file  Social Needs  . Financial resource strain: Not on file  . Food insecurity - worry: Not on file  . Food insecurity - inability: Not on file  . Transportation needs - medical: Not on file  . Transportation needs - non-medical: Not on file  Occupational History  . Occupation: Control and instrumentation engineer: [name]  Tobacco Use  . Smoking status: Never Smoker  . Smokeless tobacco: Never Used  Substance and Sexual Activity  . Alcohol use: Yes    Alcohol/week: 0.0 oz    Comment: states has not  drank alcohol since 12/19/16  . Drug use: Yes    Types: Methamphetamines    Comment: former-opiate use  . Sexual activity: Not Currently    Birth control/protection: Abstinence, None  Other Topics Concern  . Not on file  Social History Narrative  . Not on file   Additional Social History:                         Sleep: Fair  Appetite:  Good  Current Medications: Current Facility-Administered Medications  Medication Dose Route Frequency Provider Last Rate Last Dose  . acetaminophen (TYLENOL) tablet 650 mg  650 mg Oral Q6H PRN Nira Conn A, NP   650 mg at 04/01/17 1156  . alum & mag hydroxide-simeth (MAALOX/MYLANTA) 200-200-20 MG/5ML suspension 30 mL  30 mL Oral Q4H PRN Nira Conn A, NP      . haloperidol (HALDOL) tablet 5 mg  5 mg Oral BID [name], Delight Ovens, MD   5 mg at 04/01/17 0757  . hydrOXYzine (ATARAX/VISTARIL) tablet 25 mg  25 mg Oral TID PRN Nira Conn A, NP   25 mg at 04/01/17 1200  . labetalol (NORMODYNE) tablet 100 mg  100 mg Oral TID Nira Conn A, NP   100 mg at 04/01/17 1157  . lithium carbonate capsule 900 mg  900 mg Oral QHS Georgiann Cocker, MD   900 mg at 03/31/17 2104  . magnesium hydroxide (MILK OF MAGNESIA) suspension 30 mL  30 mL Oral Daily PRN Nira Conn A, NP      . QUEtiapine (SEROQUEL) tablet 800 mg  800 mg Oral QHS [name], [name], MD      . rivaroxaban (XARELTO) tablet 20 mg  20 mg Oral Daily Nira Conn A, NP   20 mg at 04/01/17 0758  . traZODone (DESYREL) tablet 300 mg  300 mg Oral QHS PRN Jackelyn Poling, NP   300 mg at 03/30/17 2354    Lab Results:  Results for orders placed or performed during the hospital encounter of 03/30/17 (from the past 48 hour(s))  Hemoglobin A1c     Status: None   Collection Time: 03/31/17  6:28 AM  Result Value Ref Range   Hgb A1c MFr Bld 5.4 4.8 - 5.6 %    Comment: (NOTE) Pre diabetes:          5.7%-6.4% Diabetes:              >6.4% Glycemic control for   <7.0% adults with diabetes     Mean Plasma Glucose 108.28 mg/dL    Comment: Performed at [facility], [address], [place], [place] [address]  Lipid panel     Status: Abnormal   Collection Time: 03/31/17  6:28 AM  Result Value Ref Range   Cholesterol 201 (H) 0 - 200 mg/dL   Triglycerides 102 <725  mg/dL   HDL 42 >40 mg/dL   Total CHOL/HDL Ratio 4.8 RATIO   VLDL 24 0 - 40 mg/dL   LDL Cholesterol 981 (H) 0 - 99 mg/dL    Comment:        Total Cholesterol/HDL:CHD Risk Coronary Heart Disease Risk Table                     Men   Women  1/2 Average Risk   3.4   3.3  Average Risk       5.0   4.4  2 X Average Risk   9.6   7.1  3 X Average Risk  23.4   11.0        Use the calculated Patient Ratio above and the CHD Risk Table to determine the patient's CHD Risk.        ATP III CLASSIFICATION (LDL):  <100     mg/dL   Optimal  191-478  mg/dL   Near or Above                    Optimal  130-159  mg/dL   Borderline  295-621  mg/dL   High  >308     mg/dL   Very High Performed at [facility], [address] [place], [place] [address]   TSH     Status: None   Collection Time: 03/31/17  6:28 AM  Result Value Ref Range   TSH 4.241 0.350 - 4.500 uIU/mL    Comment: Performed by a 3rd Generation assay with a functional sensitivity of <=0.01 uIU/mL. Performed at [facility], [address], [place], [place] [address]     Blood Alcohol level:  Lab Results  Component Value Date   ETH <10 03/30/2017   ETH <10 03/28/2017    Metabolic Disorder Labs: Lab Results  Component Value Date   HGBA1C 5.4 03/31/2017   MPG 108.28 03/31/2017   MPG 108 05/07/2016   Lab Results  Component Value Date   PROLACTIN 86.9 (H) 05/07/2016   PROLACTIN 19.4 05/06/2016   Lab Results  Component Value Date   CHOL 201 (H) 03/31/2017   TRIG 122 03/31/2017   HDL 42 03/31/2017   CHOLHDL 4.8 03/31/2017   VLDL 24 03/31/2017   LDLCALC 135 (H) 03/31/2017   LDLCALC 96 05/06/2016    Physical  Findings: AIMS: Facial and Oral Movements Muscles of Facial Expression: None, normal Lips and Perioral Area: None, normal Tongue: None, normal,Extremity Movements Upper (arms, wrists, hands, fingers): None, normal Lower (legs, knees, ankles, toes): None, normal, Trunk Movements Neck, shoulders, hips: None, normal, Overall Severity Severity of abnormal movements (highest score from questions above): None, normal Incapacitation due to abnormal movements: None, normal Patient's awareness of abnormal movements (rate only patient's report): No Awareness, Dental Status Current problems with teeth and/or dentures?: No Does patient usually wear dentures?: No  CIWA:    COWS:     Musculoskeletal: Strength & Muscle Tone: within normal limits Gait & Station: normal Patient leans: N/A  Psychiatric Specialty Exam: Physical Exam  Nursing note and vitals reviewed. Constitutional: She is oriented to person, place, and time. She appears well-developed and well-nourished.  Cardiovascular: Normal rate.  Respiratory: Effort normal.  Musculoskeletal: Normal range of motion.  Neurological: She is alert and oriented to person, place, and time.  Skin: Skin is warm.    Review of Systems  Constitutional: Negative.   HENT: Negative.   Eyes: Negative.  Respiratory: Negative.   Cardiovascular: Negative.   Gastrointestinal: Negative.   Genitourinary: Negative.   Musculoskeletal: Negative.   Skin: Negative.   Neurological: Negative.   Endo/Heme/Allergies: Negative.   Psychiatric/Behavioral: Positive for depression and hallucinations. The patient is nervous/anxious.     Blood pressure 120/78, pulse 89, temperature (!) 97.5 F (36.4 C), temperature source Oral, resp. rate 16, height 5\' 7"  (1.702 m), weight 89.8 kg (198 lb), not currently breastfeeding.Body mass index is 31.01 kg/m.  General Appearance: Casual  Eye Contact:  Good  Speech:  Clear and Coherent and Normal Rate  Volume:  Normal  Mood:   Depressed  Affect:  Flat  Thought Process:  Goal Directed and Descriptions of Associations: Intact  Orientation:  Full (Time, Place, and Person)  Thought Content:  Hallucinations: Auditory Visual and Paranoid Ideation  Suicidal Thoughts:  Yes.  without intent/plan  Homicidal Thoughts:  No  Memory:  Immediate;   Good Recent;   Good Remote;   Good  Judgement:  Fair  Insight:  Fair  Psychomotor Activity:  Normal  Concentration:  Concentration: Good and Attention Span: Good  Recall:  Good  Fund of Knowledge:  Good  Language:  Good  Akathisia:  No  Handed:  Right  AIMS (if indicated):     Assets:  Communication Skills Desire for Improvement Financial Resources/Insurance Social Support  ADL's:  Intact  Cognition:  WNL  Sleep:  Number of Hours: 6.75   Problems Addressed: Schizoaffective  Treatment Plan Summary: Daily contact with patient to assess and evaluate symptoms and progress in treatment, Medication management and Plan is to:  -Continue Seroquel 800 mg PO QHS for mood stability -Continue Lithium 900 mg PO QHS for mood stability -Continue Haldol 5 mg PO BID for mood stability -Continue Trazodone 300 mg PO QHS for mood stability -Encourage group therapy participation  Maryfrances Bunnell, FNP 04/01/2017, 12:48 PM   Agree with NP Progress Note

## 2017-04-01 NOTE — Progress Notes (Signed)
D:Pt has been sad, isolating and tearful. Pt reports having voices and anxiety. She talked about stopping suboxone and missing her two children. Pt's blood pressure was elevated this morning. It was reported to the MD and rechecked.  A:Offered support, encouragement and 15 minute checks.  R:Safety maintained on the unit.

## 2017-04-01 NOTE — Progress Notes (Signed)
D: Pt was in bed in her room upon initial approach.  Pt presents with depressed affect and mood.  She reports her day was "stressful" and she had "real bad voices" earlier today but "they're better now."  Pt reports AH is no longer command.  Goal was to "get out of bed" and she met goal.  Pt denies SI/HI, denies VH, denies pain.  Pt has been in her room for the majority of the night.  She did not attend evening group.     A: Introduced self to pt.  Actively listened to pt and offered support and encouragement. Medications administered per order.  Q15 minute safety checks maintained.  R: Pt is safe on the unit.  Pt is compliant with medications.  Pt verbally contracts for safety.  Will continue to monitor and assess.

## 2017-04-01 NOTE — Progress Notes (Signed)
Patient did not attend wrap up group. 

## 2017-04-01 NOTE — [facility] Group Notes (Signed)
[facility] LCSW Group Therapy Note  Date/Time: 04/01/17, 1330  Type of Therapy/Topic:  Group Therapy:  Feelings about Diagnosis  Participation Level:  Did Not Attend   Mood:   Description of Group:    This group will allow patients to explore their thoughts and feelings about diagnoses they have received. Patients will be guided to explore their level of understanding and acceptance of these diagnoses. Facilitator will encourage patients to process their thoughts and feelings about the reactions of others to their diagnosis, and will guide patients in identifying ways to discuss their diagnosis with significant others in their lives. This group will be process-oriented, with patients participating in exploration of their own experiences as well as giving and receiving support and challenge from other group members.   Therapeutic Goals: 1. Patient will demonstrate understanding of diagnosis as evidence by identifying two or more symptoms of the disorder:  2. Patient will be able to express two feelings regarding the diagnosis 3. Patient will demonstrate ability to communicate their needs through discussion and/or role plays  Summary of Patient Progress:        Therapeutic Modalities:   Cognitive Behavioral Therapy Brief Therapy Feelings Identification   Greg Kira Hartl, LCSW 

## 2017-04-02 DIAGNOSIS — R443 Hallucinations, unspecified: Secondary | ICD-10-CM

## 2017-04-02 LAB — GLUCOSE, CAPILLARY: Glucose-Capillary: 96 mg/dL (ref 65–99)

## 2017-04-02 MED ORDER — QUETIAPINE FUMARATE 300 MG PO TABS
600.0000 mg | ORAL_TABLET | Freq: Every day | ORAL | Status: DC
  Administered 2017-04-02 – 2017-04-03 (×2): 600 mg via ORAL
  Filled 2017-04-02 (×4): qty 2

## 2017-04-02 MED ORDER — QUETIAPINE FUMARATE 50 MG PO TABS
50.0000 mg | ORAL_TABLET | Freq: Two times a day (BID) | ORAL | Status: DC
  Administered 2017-04-02 – 2017-04-04 (×4): 50 mg via ORAL
  Filled 2017-04-02 (×8): qty 1

## 2017-04-02 MED ORDER — TRAZODONE HCL 100 MG PO TABS
100.0000 mg | ORAL_TABLET | Freq: Every evening | ORAL | Status: DC | PRN
  Administered 2017-04-03: 100 mg via ORAL
  Filled 2017-04-02: qty 1

## 2017-04-02 MED ORDER — LORAZEPAM 0.5 MG PO TABS
0.5000 mg | ORAL_TABLET | Freq: Four times a day (QID) | ORAL | Status: DC | PRN
  Administered 2017-04-02 – 2017-04-04 (×6): 0.5 mg via ORAL
  Filled 2017-04-02 (×6): qty 1

## 2017-04-02 NOTE — Progress Notes (Signed)
Nursing Progress Note 1900-0730  D) Patient presents with anxious mood and requests use of her PRN anti-anxiety medications. Patient reports these medications are effective and states, "I think I might have more anxiety than psychosis". Patient did attend group and was seen interactive in the milieu. Patient denies SI/HI or pain. Patient reports auditory hallucinations "on and off". Patient contracts for safety on the unit. Patient reports sleeping well with current regimen. Patient compliant with scheduled Seroquel.  A) Emotional support given. 1:1 interaction and active listening provided. Patient medicated as prescribed. Medications and plan of care reviewed with patient. Patient verbalized understanding without further questions. Snacks and fluids provided. Opportunities for questions or concerns presented to patient. Patient encouraged to continue to work on treatment goals. Labs, vital signs and patient behavior monitored throughout shift. Patient safety maintained with q15 min safety checks. Low fall risk precautions in place and reviewed with patient; patient verbalized understanding.  R) Patient receptive to interaction with nurse. Patient remains safe on the unit at this time. Patient denies any adverse medication reactions at this time. Patient is resting in bed without complaints. Will continue to monitor.

## 2017-04-02 NOTE — Plan of Care (Signed)
  Progressing Coping: Ability to verbalize feelings will improve 04/02/2017 1129 - Progressing by Lenord Fellersopson, Jacquel Mccamish Elizabeth, RN Health Behavior/Discharge Planning: Compliance with prescribed medication regimen will improve 04/02/2017 1129 - Progressing by Lenord Fellersopson, Meshach Perry Elizabeth, RN

## 2017-04-02 NOTE — Progress Notes (Signed)
Patient ID: Megan Edwards, female   DOB: 12/08/1975, 42 y.o.   MRN: [id number]  Patient reported that she would like her cuts on her right forearm to be covered as her children were visiting her this evening. Writer placed a non-adherent bandage on her right forearm with tape. Patient reports she is excited but nervous for this visit. Writer offered encouragement and support.

## 2017-04-02 NOTE — Progress Notes (Signed)
Recreation Therapy Notes  Date: 04/02/17 Time: 0930 Location: [address] Dayroom  Group Topic: Stress Management  Goal Area(s) Addresses:  Patient will verbalize importance of using healthy stress management.  Patient will identify positive emotions associated with healthy stress management.   Intervention: Stress Management  Activity : Guided Imagery.  LRT introduced the stress management technique of guided imagery.  LRT read a script that allowed patients to visualize themselves along the beach.  Patients were to listen and follow along as script was read to engage in the activity.  Education: Stress Management, Discharge Planning.   Education Outcome: Acknowledges edcuation/In group clarification offered/Needs additional education  Clinical Observations/Feedback: Pt did not attend group.   Caroll RancherMarjette Jeraldin Fesler, LRT/CTRS         [name], [name] A 04/02/2017 11:56 AM

## 2017-04-02 NOTE — Progress Notes (Signed)
Pt at tend wrap up group her day was a  5. Her goal was to see the doctor and she was able to accomplish this goal.

## 2017-04-02 NOTE — [facility] Group Notes (Signed)
[facility] Group Therapy 04/02/2017 1:15pm  Type of Therapy: Mental Health Association Presentation  Participation Level: DID NOT ATTEND. Pt invited.   Summary of Progress/Problems: Mental Health Association [facility]) Speaker came to talk about his personal journey with mental health. The pt processed ways by which to relate to the speaker. [facility] speaker provided handouts and educational information pertaining to groups and services offered by the [facility]. Pt was engaged in speaker's presentation and was receptive to resources provided.    Pulte HomesHeather N Smart, LCSW 04/02/2017 3:39 PM

## 2017-04-02 NOTE — Progress Notes (Signed)
[facility] MD Progress Note  04/02/2017 12:02 PM Megan Edwards  MRN:  [id number]   Subjective:  Patient reports persistent auditory hallucinations , increased anxiety today. Denies medication side effects, but reports some akathisia symptoms yesterday evening .    Objective:  I have discussed case with treatment team and have met with patient. 41 year old separated female, has two children, who are currently with their father, unemployed. Patient presented to ED voluntarily due mood instability ( " I felt my mood was all over the place") , auditory hallucinations , self injurious behaviors- cutting wrists. Patient has history of prior diagnosis of Schizoaffective Disorder. Also has history of  Opiate and Alcohol Use Disorder, and states she had tapered off Suboxone management a few weeks prior to admission. Reports she remains anxious, subjectively depressed, agitated which she attributes partially to persistent hallucinations. Presented quite anxious initially , but affect improved partially during session, and was responsive to reassurance, support. Denies suicidal plan or intention, and states " I want to get better". She is also future oriented, and states she is hoping to be able to go to an [facility] Currently on Cogentin, Haldol, Seroquel, Trazodone .  States she slept poorly last night, which she attributes in part to " restless legs ".     Principal Problem: Schizoaffective disorder (HCC) Diagnosis:   Patient Active Problem List   Diagnosis Date Noted  . Schizoaffective disorder (HCC) [F25.9] 03/30/2017  . Adjustment disorder with disturbance of emotion [F43.29] 03/29/2017  . Alcohol abuse with alcohol-induced mood disorder (HCC) [F10.14] 01/18/2017  . Anxiety and depression [F41.9, F32.9] 01/14/2017  . Pulmonary embolism (HCC) [I26.99] 12/19/2016  . Alcohol withdrawal (HCC) [F10.239] 12/01/2016  . ADD (attention deficit disorder) [F98.8] 05/10/2016  . Opioid use  disorder, mild, in controlled environment (HCC) [F11.10] 05/06/2016  . Essential hypertension [I10] 05/06/2016  . Alcohol use disorder, moderate, in early remission, dependence (HCC) [F10.21] 05/06/2016  . Bipolar I disorder, most recent episode manic, severe with psychotic features (HCC) [F31.2] 01/20/2016  . Chronic pain syndrome [G89.4]    Total Time spent with patient: 20 minutes  Past Psychiatric History: See H&P  Past Medical History:  Past Medical History:  Diagnosis Date  . Abnormal Pap smear   . Alcohol abuse   . Anxiety and depression 01/14/2017  . Bipolar 1 disorder (HCC)   . Depression   . Genital herpes 2000  . Hypertension   . Infection    UTI  . Ovarian cyst   . Schizoaffective disorder (HCC) 05/05/2016  . SVT (supraventricular tachycardia) (HCC)     Past Surgical History:  Procedure Laterality Date  . addenoidectomy    . BREAST ENHANCEMENT SURGERY    . BREAST SURGERY    . KNEE SURGERY    . KNEE SURGERY Left   . TONSILLECTOMY    . WISDOM TOOTH EXTRACTION     Family History:  Family History  Problem Relation Age of Onset  . Alcoholism Unknown   . Leukemia Unknown   . Cervical cancer Unknown   . Bone cancer Unknown   . Cirrhosis Unknown   . Diabetes Mellitus II Unknown   . Depression Unknown   . Hypertension Unknown   . Thyroid cancer Unknown   . Diabetes Mellitus II Mother   . Hypertension Mother   . Alcoholism Father   . Mental illness Paternal Grandmother    Family Psychiatric  History: See H&P Social History:  Social History   Substance and   Sexual Activity  Alcohol Use Yes  . Alcohol/week: 0.0 oz   Comment: states has not drank alcohol since 12/19/16     Social History   Substance and Sexual Activity  Drug Use Yes  . Types: Methamphetamines   Comment: former-opiate use    Social History   Socioeconomic History  . Marital status: Divorced    Spouse name: Not on file  . Number of children: Not on file  . Years of education: Not  on file  . Highest education level: Not on file  Social Needs  . Financial resource strain: Not on file  . Food insecurity - worry: Not on file  . Food insecurity - inability: Not on file  . Transportation needs - medical: Not on file  . Transportation needs - non-medical: Not on file  Occupational History  . Occupation: Server    Employer: [name]  Tobacco Use  . Smoking status: Never Smoker  . Smokeless tobacco: Never Used  Substance and Sexual Activity  . Alcohol use: Yes    Alcohol/week: 0.0 oz    Comment: states has not drank alcohol since 12/19/16  . Drug use: Yes    Types: Methamphetamines    Comment: former-opiate use  . Sexual activity: Not Currently    Birth control/protection: Abstinence, None  Other Topics Concern  . Not on file  Social History Narrative  . Not on file   Additional Social History:   Sleep: Poor  Appetite:  Fair  Current Medications: Current Facility-Administered Medications  Medication Dose Route Frequency Provider Last Rate Last Dose  . acetaminophen (TYLENOL) tablet 650 mg  650 mg Oral Q6H PRN Berry, Jason A, NP   650 mg at 04/02/17 0757  . alum & mag hydroxide-simeth (MAALOX/MYLANTA) 200-200-20 MG/5ML suspension 30 mL  30 mL Oral Q4H PRN Berry, Jason A, NP      . benztropine (COGENTIN) tablet 0.5 mg  0.5 mg Oral Daily Simon, Spencer E, PA-C   0.5 mg at 04/01/17 2350  . haloperidol (HALDOL) tablet 5 mg  5 mg Oral BID [name], [name], MD   5 mg at 04/02/17 0755  . hydrOXYzine (ATARAX/VISTARIL) tablet 25 mg  25 mg Oral TID PRN Berry, Jason A, NP   25 mg at 04/02/17 0947  . labetalol (NORMODYNE) tablet 100 mg  100 mg Oral TID Berry, Jason A, NP   100 mg at 04/02/17 0755  . lithium carbonate capsule 900 mg  900 mg Oral QHS [name], [name], MD   900 mg at 04/01/17 2114  . magnesium hydroxide (MILK OF MAGNESIA) suspension 30 mL  30 mL Oral Daily PRN Berry, Jason A, NP      . QUEtiapine (SEROQUEL) tablet 800 mg  800 mg Oral QHS  [name], [name], MD   800 mg at 04/01/17 2114  . rivaroxaban (XARELTO) tablet 20 mg  20 mg Oral Daily Berry, Jason A, NP   20 mg at 04/02/17 0756  . traZODone (DESYREL) tablet 300 mg  300 mg Oral QHS PRN Berry, Jason A, NP   300 mg at 03/30/17 2354    Lab Results:  No results found for this or any previous visit (from the past 48 hour(s)).  Blood Alcohol level:  Lab Results  Component Value Date   ETH <10 03/30/2017   ETH <10 03/28/2017    Metabolic Disorder Labs: Lab Results  Component Value Date   HGBA1C 5.4 03/31/2017   MPG 108.28 03/31/2017   MPG 108 05/07/2016     Lab Results  Component Value Date   PROLACTIN 86.9 (H) 05/07/2016   PROLACTIN 19.4 05/06/2016   Lab Results  Component Value Date   CHOL 201 (H) 03/31/2017   TRIG 122 03/31/2017   HDL 42 03/31/2017   CHOLHDL 4.8 03/31/2017   VLDL 24 03/31/2017   LDLCALC 135 (H) 03/31/2017   LDLCALC 96 05/06/2016    Physical Findings: AIMS: Facial and Oral Movements Muscles of Facial Expression: None, normal Lips and Perioral Area: None, normal Jaw: None, normal Tongue: None, normal,Extremity Movements Upper (arms, wrists, hands, fingers): None, normal Lower (legs, knees, ankles, toes): Mild, Trunk Movements Neck, shoulders, hips: None, normal, Overall Severity Severity of abnormal movements (highest score from questions above): Mild Incapacitation due to abnormal movements: Minimal Patient's awareness of abnormal movements (rate only patient's report): Aware, mild distress, Dental Status Current problems with teeth and/or dentures?: No Does patient usually wear dentures?: No  CIWA:    COWS:     Musculoskeletal: Strength & Muscle Tone: within normal limits Gait & Station: normal Patient leans: N/A  Psychiatric Specialty Exam: Physical Exam  Nursing note and vitals reviewed. Constitutional: She is oriented to person, place, and time. She appears well-developed and well-nourished.  Cardiovascular: Normal  rate.  Respiratory: Effort normal.  Musculoskeletal: Normal range of motion.  Neurological: She is alert and oriented to person, place, and time.  Skin: Skin is warm.    Review of Systems  Constitutional: Negative.   HENT: Negative.   Eyes: Negative.   Respiratory: Negative.   Cardiovascular: Negative.   Gastrointestinal: Negative.   Genitourinary: Negative.   Musculoskeletal: Negative.   Skin: Negative.   Neurological: Negative.   Endo/Heme/Allergies: Negative.   Psychiatric/Behavioral: Positive for depression and hallucinations. The patient is nervous/anxious.   reports mild headache, reports nausea, and states she vomited x 1 earlier today .   Blood pressure (!) 128/92, pulse 94, temperature 98.4 F (36.9 C), temperature source Oral, resp. rate 20, height 5' 7" (1.702 m), weight 89.8 kg (198 lb), not currently breastfeeding.Body mass index is 31.01 kg/m.  Repeat BP 120/90 , pulse 96  Capillary Glucose 96  General Appearance: Fairly Groomed  Eye Contact:  Good  Speech:  Normal Rate  Volume:  Normal  Mood:  depressed, anxious  Affect:  labile, intermittently tearful at times   Thought Process:  Linear and Descriptions of Associations: Intact  Orientation:  Full (Time, Place, and Person)  Thought Content:  reports auditory hallucinations, no delusions endorsed   Suicidal Thoughts:  No- denies current suicidal plan or intention, and contracts for safety on unit   Homicidal Thoughts:  No  Memory:  Recent and remote grossly intact   Judgement:  Fair- improving   Insight:  Fair- improving   Psychomotor Activity:  Normal  Concentration:  Concentration: Good and Attention Span: Good  Recall:  Good  Fund of Knowledge:  Good  Language:  Good  Akathisia:  No  Handed:  Right  AIMS (if indicated):     Assets:  Communication Skills Desire for Improvement Financial Resources/Insurance Social Support  ADL's:  Intact  Cognition:  WNL  Sleep:  Number of Hours: 6.25   Assessment  - 41 year old female, history of Mood Disorder, psychotic symptoms, which she states started about 2-3 years ago. History of substance abuse ( alcohol, opiates ) , states now sober x 8 weeks, in spite of which has experienced persistent  auditory hallucinations,  depression, anxiety. Denies active SI, contracts for safety on unit, and   is future oriented , wanting to go to an [facility] setting [facility] We reviewed medication regimen- patient reports she feels Seroquel, Haldol most effective medications, does not feel that Lithium is helping , and based on potential side effects/toxicity discussed , states she would rather stop it . Would prefer to change Seroquel to BID or TID dosing .  Treatment Plan Summary: Daily contact with patient to assess and evaluate symptoms and progress in treatment, Medication management and Plan is to:  -Encourage group ,milieu participation to work on coping skills and symptom reduction -Change Seroquel to 50 mgrs BID + 600 mgrs QHS for mood disorder and psychotic symptoms -Start Ativan 0.5 mgrs Q 6 hours PRN for anxiety as needed  -D/C Haldol - will attempt to manage symptoms with one antipsychotic agent .  -D/C Lithium -Decrease  Trazodone to 100 mg PO QHS for insomnia  -Treatment team working on disposition planning options  -Check EKG to monitor QTc    A , MD 04/02/2017, 12:02 PM   Patient ID: Megan Edwards, female   DOB: 12/11/1975, 41 y.o.   MRN: [id number]  

## 2017-04-02 NOTE — Progress Notes (Signed)
Patient ID: Megan Edwards, female   DOB: 1976/01/28, 42 y.o.   MRN: [id number]  DAR: Pt. Denies SI/HI and visual hallucinations. She continues to report auditory hallucinations. She reports that her sleep was poor, her appetite is poor, her energy level is low, and her concentration is poor. She rates her depression level 7/10, her hopelessness level is 7/10, and anxiety level is 7/10. Patient reports pain in her right arm and received PRN medication for this. At this time, per NP Money, patient can leave area open to air. Patient was notified to let staff know if she sees any drainage from the cuts. Patient verbalized understanding. At this time the area presents with no drainage. Support and encouragement provided to the patient. Scheduled medications and PRN anti-anxiety medication was provided to patient per physician's orders. Patient is currently cooperative and presents with no behavioral issues. She is seen in the milieu intermittently. Q15 minute checks are maintained for safety.

## 2017-04-03 DIAGNOSIS — R000 Tachycardia, unspecified: Secondary | ICD-10-CM

## 2017-04-03 DIAGNOSIS — G4700 Insomnia, unspecified: Secondary | ICD-10-CM

## 2017-04-03 MED ORDER — LABETALOL HCL 200 MG PO TABS
200.0000 mg | ORAL_TABLET | Freq: Two times a day (BID) | ORAL | Status: DC
  Administered 2017-04-03 – 2017-04-04 (×2): 200 mg via ORAL
  Filled 2017-04-03 (×6): qty 1
  Filled 2017-04-03: qty 2

## 2017-04-03 NOTE — Progress Notes (Signed)
Adult Psychoeducational Group Note  Date:  04/03/2017 Time:  10:44 AM  Group Topic/Focus:  Building Self Esteem:   The Focus of this group is helping patients become aware of the effects of self-esteem on their lives, the things they and others do that enhance or undermine their self-esteem, seeing the relationship between their level of self-esteem and the choices they make and learning ways to enhance self-esteem.  Participation Level:  Active  Participation Quality:  Appropriate and Attentive  Affect:  Appropriate  Cognitive:  Alert and Appropriate  Insight: Appropriate, Good and Improving  Engagement in Group:  Engaged  Modes of Intervention:  Activity  Additional Comments:  Pt did participate in all activities and discussions today.  Cailyn Houdek R Brittyn Salaz 04/03/2017, 10:44 AM

## 2017-04-03 NOTE — [facility] Group Notes (Signed)
[facility] LCSW Group Therapy Note  Date/Time: 04/03/17, 1315  Type of Therapy/Topic:  Group Therapy:  Balance in Life  Participation Level:  active  Description of Group:    This group will address the concept of balance and how it feels and looks when one is unbalanced. Patients will be encouraged to process areas in their lives that are out of balance, and identify reasons for remaining unbalanced. Facilitators will guide patients utilizing problem- solving interventions to address and correct the stressor making their life unbalanced. Understanding and applying boundaries will be explored and addressed for obtaining  and maintaining a balanced life. Patients will be encouraged to explore ways to assertively make their unbalanced needs known to significant others in their lives, using other group members and facilitator for support and feedback.  Therapeutic Goals: 1. Patient will identify two or more emotions or situations they have that consume much of in their lives. 2. Patient will identify signs/triggers that life has become out of balance:  3. Patient will identify two ways to set boundaries in order to achieve balance in their lives:  4. Patient will demonstrate ability to communicate their needs through discussion and/or role plays  Summary of Patient Progress:Pt shared that family and work/school are areas that are out of balance in her life.  Pt was active in discussion regarding ways to address out of balance areas in a positive way.          Therapeutic Modalities:   Cognitive Behavioral Therapy Solution-Focused Therapy Assertiveness Training  Daleen SquibbGreg Joshiah Traynham, [place]

## 2017-04-03 NOTE — Progress Notes (Signed)
Patient ID: Megan Edwards, female   DOB: September 27, 1975, 42 y.o.   MRN: [id number]  DAR: Pt. Denies SI/HI and A/V Hallucinations. She reports that her sleep last night was fair, her appetite is fair, her energy level is normal, and her concentration is poor. She rates her depression level 3/10, her hopelessness level is 3/10, and anxiety level is 7/10. She is receiving PRN Ativan for her anxiety. Patient does not report any pain this morning. Support and encouragement provided to the patient. Scheduled medications administered to patient per physician's orders. Patient is seen in the milieu and interacting with some of her peers. Q15 minute checks are maintained for safety.

## 2017-04-03 NOTE — Progress Notes (Signed)
Nursing Progress Note 1900-0730  D) Patient presents with animated affect and is pleasant this evening. Patient states, "I feel like I have been in a fog for two years. I went from thirteen medicines to four and now I feel fantastic". Patient did attend group. Patient is seen interactive in the milieu. Patient denies SI/HI/AVH or pain. Patient contracts for safety on the unit. Patient reports sleeping well with current regimen. Patient compliant with scheduled medications and requests Vistaril this evening.  A) Emotional support given. 1:1 interaction and active listening provided. Patient medicated as prescribed. Medications and plan of care reviewed with patient. Patient verbalized understanding without further questions. Snacks and fluids provided. Opportunities for questions or concerns presented to patient. Patient encouraged to continue to work on treatment goals. Labs, vital signs and patient behavior monitored throughout shift. Patient safety maintained with q15 min safety checks. Low fall risk precautions in place and reviewed with patient; patient verbalized understanding.  R) Patient receptive to interaction with nurse. Patient remains safe on the unit at this time. Patient denies any adverse medication reactions at this time. Patient is resting in bed without complaints. Will continue to monitor.

## 2017-04-03 NOTE — Progress Notes (Signed)
[facility] Group Notes:  (Nursing/MHT/Case Management/Adjunct)  Date:  04/03/2017  Time:  0830 Type of Therapy:  Nurse Education  Participation Level:  Active  Participation Quality:  Appropriate  Affect:  Appropriate  Cognitive:  Alert and Oriented  Insight:  Appropriate  Engagement in Group:  Engaged  Modes of Intervention:  Activity, Discussion, Education, Socialization and Support The purpose of this group is to educate and introduce patients to the benefits of aromatherapy. Pt participated and was engaged in group.  Summary of Progress/Problems:  Beatrix ShipperWright, Altie Savard Martin 04/03/2017, 5:08 PM

## 2017-04-03 NOTE — Progress Notes (Signed)
[facility]  04/03/2017 12:23 PM Megan HalstedKelly L Edwards  MRN:  [id number]   Subjective:  Patient reports that she is feeling much better. She denies any SI/HI/AVHa and contracts for safety. She denies any depression and only mild anxiety. She states the medication is working well and denies any side effects. She reports that she feels she is ready to discharge and will return home with her "son's father." She reports taht her CST team will be following her at home as well and has approved for her son's father Megan Edwards(Davis) to be contacted for collateral.  Objective: Patient's chart and findings reviewed and discussed with treatment team. Patient presents pleasant, cooperative, and with an appropriate affect. She has shown elevated BP and HR and will increase Labetalol to 200 mg BID.   Principal Problem: Schizoaffective disorder (HCC) Diagnosis:   Patient Active Problem List   Diagnosis Date Noted  . Schizoaffective disorder (HCC) [F25.9] 03/30/2017  . Adjustment disorder with disturbance of emotion [F43.29] 03/29/2017  . Alcohol abuse with alcohol-induced mood disorder (HCC) [F10.14] 01/18/2017  . Anxiety and depression [F41.9, F32.9] 01/14/2017  . Pulmonary embolism (HCC) [I26.99] 12/19/2016  . Alcohol withdrawal (HCC) [F10.239] 12/01/2016  . ADD (attention deficit disorder) [F98.8] 05/10/2016  . Opioid use disorder, mild, in controlled environment (HCC) [F11.10] 05/06/2016  . Essential hypertension [I10] 05/06/2016  . Alcohol use disorder, moderate, in early remission, dependence (HCC) [F10.21] 05/06/2016  . Bipolar I disorder, most recent episode manic, severe with psychotic features (HCC) [F31.2] 01/20/2016  . Chronic pain syndrome [G89.4]    Total Time spent with patient: 25 minutes  Past Psychiatric History: See H&P  Past Medical History:  Past Medical History:  Diagnosis Date  . Abnormal Pap smear   . Alcohol abuse   . Anxiety and depression 01/14/2017  . Bipolar 1 disorder  (HCC)   . Depression   . Genital herpes 2000  . Hypertension   . Infection    UTI  . Ovarian cyst   . Schizoaffective disorder (HCC) 05/05/2016  . SVT (supraventricular tachycardia) (HCC)     Past Surgical History:  Procedure Laterality Date  . addenoidectomy    . BREAST ENHANCEMENT SURGERY    . BREAST SURGERY    . KNEE SURGERY    . KNEE SURGERY Left   . TONSILLECTOMY    . WISDOM TOOTH EXTRACTION     Family History:  Family History  Problem Relation Age of Onset  . Alcoholism Unknown   . Leukemia Unknown   . Cervical cancer Unknown   . Bone cancer Unknown   . Cirrhosis Unknown   . Diabetes Mellitus II Unknown   . Depression Unknown   . Hypertension Unknown   . Thyroid cancer Unknown   . Diabetes Mellitus II Mother   . Hypertension Mother   . Alcoholism Father   . Mental illness Paternal Grandmother    Family Psychiatric  History: See H&P Social History:  Social History   Substance and Sexual Activity  Alcohol Use Yes  . Alcohol/week: 0.0 oz   Comment: states has not drank alcohol since 12/19/16     Social History   Substance and Sexual Activity  Drug Use Yes  . Types: Methamphetamines   Comment: former-opiate use    Social History   Socioeconomic History  . Marital status: Divorced    Spouse name: Not on file  . Number of children: Not on file  . Years of education: Not on file  . Highest education  level: Not on file  Social Needs  . Financial resource strain: Not on file  . Food insecurity - worry: Not on file  . Food insecurity - inability: Not on file  . Transportation needs - medical: Not on file  . Transportation needs - non-medical: Not on file  Occupational History  . Occupation: Control and instrumentation engineer: [employer]  Tobacco Use  . Smoking status: Never Smoker  . Smokeless tobacco: Never Used  Substance and Sexual Activity  . Alcohol use: Yes    Alcohol/week: 0.0 oz    Comment: states has not drank alcohol since 12/19/16  . Drug use:  Yes    Types: Methamphetamines    Comment: former-opiate use  . Sexual activity: Not Currently    Birth control/protection: Abstinence, None  Other Topics Concern  . Not on file  Social History Narrative  . Not on file   Additional Social History:                         Sleep: Good  Appetite:  Good  Current Medications: Current Facility-Administered Medications  Medication Dose Route Frequency Provider Last Rate Last Dose  . acetaminophen (TYLENOL) tablet 650 mg  650 mg Oral Q6H PRN Nira Conn A, NP   650 mg at 04/03/17 0531  . alum & mag hydroxide-simeth (MAALOX/MYLANTA) 200-200-20 MG/5ML suspension 30 mL  30 mL Oral Q4H PRN Nira Conn A, NP      . benztropine (COGENTIN) tablet 0.5 mg  0.5 mg Oral Daily Donell Sievert E, PA-C   0.5 mg at 04/03/17 0758  . hydrOXYzine (ATARAX/VISTARIL) tablet 25 mg  25 mg Oral TID PRN Nira Conn A, NP   25 mg at 04/02/17 2111  . labetalol (NORMODYNE) tablet 100 mg  100 mg Oral TID Nira Conn A, NP   100 mg at 04/03/17 1206  . LORazepam (ATIVAN) tablet 0.5 mg  0.5 mg Oral Q6H PRN Cobos, Rockey Situ, MD   0.5 mg at 04/03/17 1134  . magnesium hydroxide (MILK OF MAGNESIA) suspension 30 mL  30 mL Oral Daily PRN Nira Conn A, NP      . QUEtiapine (SEROQUEL) tablet 50 mg  50 mg Oral BID Cobos, Rockey Situ, MD   50 mg at 04/03/17 0758  . QUEtiapine (SEROQUEL) tablet 600 mg  600 mg Oral QHS Cobos, Rockey Situ, MD   600 mg at 04/02/17 2110  . rivaroxaban (XARELTO) tablet 20 mg  20 mg Oral Daily Nira Conn A, NP   20 mg at 04/03/17 0757  . traZODone (DESYREL) tablet 100 mg  100 mg Oral QHS PRN Cobos, Rockey Situ, MD        Lab Results:  Results for orders placed or performed during the hospital encounter of 03/30/17 (from the past 48 hour(s))  Glucose, capillary     Status: None   Collection Time: 04/02/17 12:18 PM  Result Value Ref Range   Glucose-Capillary 96 65 - 99 mg/dL    Blood Alcohol level:  Lab Results  Component Value Date    ETH <10 03/30/2017   ETH <10 03/28/2017    Metabolic Disorder Labs: Lab Results  Component Value Date   HGBA1C 5.4 03/31/2017   MPG 108.28 03/31/2017   MPG 108 05/07/2016   Lab Results  Component Value Date   PROLACTIN 86.9 (H) 05/07/2016   PROLACTIN 19.4 05/06/2016   Lab Results  Component Value Date   CHOL 201 (H) 03/31/2017  TRIG 122 03/31/2017   HDL 42 03/31/2017   CHOLHDL 4.8 03/31/2017   VLDL 24 03/31/2017   LDLCALC 135 (H) 03/31/2017   LDLCALC 96 05/06/2016    Physical Findings: AIMS: Facial and Oral Movements Muscles of Facial Expression: None, normal Lips and Perioral Area: None, normal Jaw: None, normal Tongue: None, normal,Extremity Movements Upper (arms, wrists, hands, fingers): None, normal Lower (legs, knees, ankles, toes): None, normal, Trunk Movements Neck, shoulders, hips: None, normal, Overall Severity Severity of abnormal movements (highest score from questions above): None, normal Incapacitation due to abnormal movements: None, normal Patient's awareness of abnormal movements (rate only patient's report): No Awareness, Dental Status Current problems with teeth and/or dentures?: No Does patient usually wear dentures?: No  CIWA:    COWS:     Musculoskeletal: Strength & Muscle Tone: within normal limits Gait & Station: normal Patient leans: N/A  Psychiatric Specialty Exam: Physical Exam  Nursing note and vitals reviewed. Constitutional: She is oriented to person, place, and time. She appears well-developed and well-nourished.  Cardiovascular:  Tachycardic and HTN  Respiratory: Effort normal.  Musculoskeletal: Normal range of motion.  Neurological: She is alert and oriented to person, place, and time.  Skin: Skin is warm.    Review of Systems  Constitutional: Negative.   HENT: Negative.   Eyes: Negative.   Respiratory: Negative.   Cardiovascular: Negative.   Gastrointestinal: Negative.   Genitourinary: Negative.    Musculoskeletal: Negative.   Skin: Negative.   Neurological: Negative.   Endo/Heme/Allergies: Negative.   Psychiatric/Behavioral: The patient is nervous/anxious.     Blood pressure (!) 140/110, pulse (!) 104, temperature 97.9 F (36.6 C), temperature source Oral, resp. rate 18, height 5\' 7"  (1.702 m), weight 89.8 kg (198 lb), not currently breastfeeding.Body mass index is 31.01 kg/m.  General Appearance: Casual  Eye Contact:  Good  Speech:  Clear and Coherent and Normal Rate  Volume:  Normal  Mood:  Anxious  Affect:  Congruent  Thought Process:  Goal Directed and Descriptions of Associations: Intact  Orientation:  Full (Time, Place, and Person)  Thought Content:  WDL  Suicidal Thoughts:  No  Homicidal Thoughts:  No  Memory:  Immediate;   Good Recent;   Good Remote;   Good  Judgement:  Good  Insight:  Good  Psychomotor Activity:  Normal  Concentration:  Concentration: Good and Attention Span: Good  Recall:  Good  Fund of Knowledge:  Good  Language:  Good  Akathisia:  No  Handed:  Right  AIMS (if indicated):     Assets:  Communication Skills Desire for Improvement Financial Resources/Insurance Housing Social Support Transportation  ADL's:  Intact  Cognition:  WNL  Sleep:  Number of Hours: 6.25   Problems Addressed: HTN Schizoaffective  Treatment Plan Summary: Daily contact with patient to assess and evaluate symptoms and progress in treatment, Medication management and Plan is to:  -Increase Labetalol 200 mg PO BID for HTN and tachycardia -Continue Seroquel 600 mg PO QHS and 50 mg PO BID for mood stability -Continue Cogentin 0.5 mg PO Daily for EPS -Continue Ativan 0.5 mg PO Q6H PRN for anxiety -Continue Vistaril 25 mg PO TID PRN for anxiety -Continue Trazodone 100 mg PO QHS for insomnia -Encourage group therapy participation  Maryfrances Bunnell, FNP 04/03/2017, 12:23 PM   Agree with Progress Note

## 2017-04-03 NOTE — [facility] Suicide Risk Assessment (Signed)
[facility] INPATIENT:  Family/Significant Other Suicide Prevention Education  Suicide Prevention Education:  Education Completed; Sherral HammersDavid Gilchrist, significant other, [phone], has been identified by the patient as the family member/significant other with whom the patient will be residing, and identified as the person(s) who will aid the patient in the event of a mental health crisis (suicidal ideations/suicide attempt).  With written consent from the patient, the family member/significant other has been provided the following suicide prevention education, prior to the and/or following the discharge of the patient.  The suicide prevention education provided includes the following:  Suicide risk factors  Suicide prevention and interventions  National Suicide Hotline telephone number  [facility] assessment telephone number  [facility] Assistance 911  [facility] and/or [facility] telephone number  Request made of family/significant other to:  Remove weapons (e.g., guns, rifles, knives), all items previously/currently identified as safety concern.  No guns in the home, per Onalee Huaavid.  Remove drugs/medications (over-the-counter, prescriptions, illicit drugs), all items previously/currently identified as a safety concern.  The family member/significant other verbalizes understanding of the suicide prevention education information provided.  The family member/significant other agrees to remove the items of safety concern listed above.  Onalee HuaDavid reports he visited with pt last night and she seemed like she was doing well.  He is willing to have her come back and does not have concerns about discharge.  Her mother would be the one to pick her up tomorrow.  Lorri FrederickWierda, Makynleigh Breslin Jon, LCSW 04/03/2017, 2:15 PM

## 2017-04-04 DIAGNOSIS — F1099 Alcohol use, unspecified with unspecified alcohol-induced disorder: Secondary | ICD-10-CM

## 2017-04-04 MED ORDER — QUETIAPINE FUMARATE 50 MG PO TABS: 50.0000 mg | ORAL_TABLET | Freq: Two times a day (BID) | ORAL | 0 refills | Status: DC

## 2017-04-04 MED ORDER — TRAZODONE HCL 100 MG PO TABS: 100.0000 mg | ORAL_TABLET | Freq: Every evening | ORAL | 0 refills | Status: DC | PRN

## 2017-04-04 MED ORDER — LABETALOL HCL 200 MG PO TABS: 200.0000 mg | ORAL_TABLET | Freq: Two times a day (BID) | ORAL | 0 refills | Status: DC

## 2017-04-04 MED ORDER — QUETIAPINE FUMARATE 300 MG PO TABS: 600.0000 mg | ORAL_TABLET | Freq: Every day | ORAL | 0 refills | Status: DC

## 2017-04-04 MED ORDER — BENZTROPINE MESYLATE 0.5 MG PO TABS: 0.5000 mg | ORAL_TABLET | Freq: Every day | ORAL | 0 refills | Status: DC

## 2017-04-04 NOTE — Progress Notes (Signed)
Recreation Therapy Notes  Date:  04/04/17 Time: 0930 Location: [address] Group Room  Group Topic: Stress Management  Goal Area(s) Addresses:  Patient will verbalize importance of using healthy stress management.  Patient will identify positive emotions associated with healthy stress management.  Intervention: Stress Management  Activity :  Progressive Muscle Relaxation.  LRT introduced the stress management technique of progressive muscle relaxation.  LRT read a script to guide patients through the technique which allowed them to tense and relax each muscle group individually.  Education:  Stress Management, Discharge Planning.   Education Outcome: Acknowledges edcuation/In group clarification offered/Needs additional education  Clinical Observations/Feedback: Pt did not attend group.     Caroll RancherMarjette Aamina Skiff, LRT/CTRS         Lillia AbedLindsay, Gurfateh Mcclain A 04/04/2017 11:06 AM

## 2017-04-04 NOTE — Progress Notes (Signed)
Patient verbalizes readiness for discharge. Follow up plan explained, AVS, transition record and SRA given along with prescriptions.  All belongings returned. Suicide Safety Plan completed, on chart and copy given to patient. Patient verbalizes understanding. Denies SI/HI and assures this Clinical research associatewriter she will seek assistance should that change. Patient discharged ambulatory and in stable condition to family.

## 2017-04-04 NOTE — Progress Notes (Signed)
D: Patient observed up and visible on the unit. Patient states she is ready for discharge and feels she has stabilized. Patient's affect appropriate, mood stable. Per self inventory and discussions with writer, rates depression, hopelessness and anxiety all at a 1/10. Rates sleep as fair, appetite as good, energy as normal and concentration as good.  States goal for today is to "discharge and structure." Denies pain, physical complaints.   A: Medicated per orders, no prns requested or received. Level III obs in place for safety. Emotional support offered and self inventory reviewed. Encouraged completion of Suicide Safety Plan and programming participation. Discussed POC with MD, SW.   R: Patient verbalizes understanding of POC. Patient denies SI/HI/AVH and remains safe on level III obs. Will continue to monitor closely and make verbal contact frequently. Awaiting discharge.

## 2017-04-04 NOTE — Discharge Summary (Signed)
Physician Discharge Summary Note  Patient:  Megan Edwards is an 42 y.o., female MRN:  [id number] DOB:  July 05, 1975 Patient phone:  [phone] (home)  Patient address:   [address] [place] [address],  Total Time spent with patient: 20 minutes  Date of Admission:  03/30/2017 Date of Discharge:  04/04/2017  Reason for Admission: Per HPI note: 42 y.o Caucasian female, divorced, unemployed, recently discharged from [facility], lives with his ex-husband and their two minor kids. Background history of Schizoaffective disorder and SUD. Presented to the ER with a lot of somatic complains. She was recently detoxed from Suboxone after being on it for ten years. Patient is reported to be having visual and auditory hallucinations. She is getting command to harm herself. She had expressed paranoia. She has not been sleeping well.  Routine labs shows slightly low sodium and low Lithium levels. No substances or alcohol on board.  Staff reports that she distressed last night. She required emergency medications. At interview, patient reports hearing two voices. They are giving her commands to end her life. They say derogatory things about her " no one loves you ,,,, jump off the bridge ". Patient feels everyone is working with her ex-husband. Feels he is out to get her. Says her mental illness has been used to limit her contact with her kids. She has not been eating much as she was worried he is trying to poison her. She also feels he might be messing with her medication. Patient says she had seen people and animals in his car which he could not see. Says she has been staying up at night as she is very scared and paranoid. She is scared she might harm herself. She denies any thoughts of harming her ex-husband or her kids. She has not used any substances since she went into rehab. She denies any other stressors. She does not have access to weapons.    Principal Problem: Schizoaffective disorder  [facility]) Discharge Diagnoses: Patient Active Problem List   Diagnosis Date Noted  . Schizoaffective disorder (HCC) [F25.9] 03/30/2017  . Adjustment disorder with disturbance of emotion [F43.29] 03/29/2017  . Alcohol abuse with alcohol-induced mood disorder (HCC) [F10.14] 01/18/2017  . Anxiety and depression [F41.9, F32.9] 01/14/2017  . Pulmonary embolism (HCC) [I26.99] 12/19/2016  . Alcohol withdrawal (HCC) [F10.239] 12/01/2016  . ADD (attention deficit disorder) [F98.8] 05/10/2016  . Opioid use disorder, mild, in controlled environment (HCC) [F11.10] 05/06/2016  . Essential hypertension [I10] 05/06/2016  . Alcohol use disorder, moderate, in early remission, dependence (HCC) [F10.21] 05/06/2016  . Bipolar I disorder, most recent episode manic, severe with psychotic features (HCC) [F31.2] 01/20/2016  . Chronic pain syndrome [G89.4]     Past Psychiatric History:   Past Medical History:  Past Medical History:  Diagnosis Date  . Abnormal Pap smear   . Alcohol abuse   . Anxiety and depression 01/14/2017  . Bipolar 1 disorder (HCC)   . Depression   . Genital herpes 2000  . Hypertension   . Infection    UTI  . Ovarian cyst   . Schizoaffective disorder (HCC) 05/05/2016  . SVT (supraventricular tachycardia) (HCC)     Past Surgical History:  Procedure Laterality Date  . addenoidectomy    . BREAST ENHANCEMENT SURGERY    . BREAST SURGERY    . KNEE SURGERY    . KNEE SURGERY Left   . TONSILLECTOMY    . WISDOM TOOTH EXTRACTION     Family History:  Family History  Problem Relation Age of Onset  . Alcoholism Unknown   . Leukemia Unknown   . Cervical cancer Unknown   . Bone cancer Unknown   . Cirrhosis Unknown   . Diabetes Mellitus II Unknown   . Depression Unknown   . Hypertension Unknown   . Thyroid cancer Unknown   . Diabetes Mellitus II Mother   . Hypertension Mother   . Alcoholism Father   . Mental illness Paternal Grandmother    Family Psychiatric  History:  Social  History:  Social History   Substance and Sexual Activity  Alcohol Use Yes  . Alcohol/week: 0.0 oz   Comment: states has not drank alcohol since 12/19/16     Social History   Substance and Sexual Activity  Drug Use Yes  . Types: Methamphetamines   Comment: former-opiate use    Social History   Socioeconomic History  . Marital status: Divorced    Spouse name: Not on file  . Number of children: Not on file  . Years of education: Not on file  . Highest education level: Not on file  Social Needs  . Financial resource strain: Not on file  . Food insecurity - worry: Not on file  . Food insecurity - inability: Not on file  . Transportation needs - medical: Not on file  . Transportation needs - non-medical: Not on file  Occupational History  . Occupation: Control and instrumentation engineer: [name]  Tobacco Use  . Smoking status: Never Smoker  . Smokeless tobacco: Never Used  Substance and Sexual Activity  . Alcohol use: Yes    Alcohol/week: 0.0 oz    Comment: states has not drank alcohol since 12/19/16  . Drug use: Yes    Types: Methamphetamines    Comment: former-opiate use  . Sexual activity: Not Currently    Birth control/protection: Abstinence, None  Other Topics Concern  . Not on file  Social History Narrative  . Not on file    Hospital Course:  Megan Edwards was admitted for Schizoaffective disorder [facility])  and crisis management.  Pt was treated discharged with the medications listed below under Medication List.  Medical problems were identified and treated as needed.  Home medications were restarted as appropriate.  Improvement was monitored by observation and Megan Edwards 's daily report of symptom reduction.  Emotional and mental status was monitored by daily self-inventory reports completed by Megan Edwards and clinical staff.         Megan Edwards was evaluated by the treatment team for stability and plans for continued recovery upon discharge. Megan Lawrence  Edwards 's motivation was an integral factor for scheduling further treatment. Employment, transportation, bed availability, health status, family support, and any pending legal issues were also considered during hospital stay. Pt was offered further treatment options upon discharge including but not limited to Residential, Intensive Outpatient, and Outpatient treatment.  Megan Edwards will follow up with the services as listed below under Follow Up Information.     Upon completion of this admission the patient was both mentally and medically stable for discharge denying suicidal/homicidal ideation, auditory/visual/tactile hallucinations, delusional thoughts and paranoia.   Megan Edwards responded well to treatment with Seroquel 600 mg QHS and 50mg  PO BID, Cogentin 0.5mg   and Trazodone 100 mg without adverse effects. Pt demonstrated improvement without reported or observed adverse effects to the point of stability appropriate for outpatient management. Pertinent labs include: Lipid  Panel  201  cholesterol and LDL cholesterol 135(high), for which outpatient follow-up is necessary for lab recheck as mentioned below. Reviewed CBC, CMP, BAL, and UDS; all unremarkable aside from noted exceptions.   Physical Findings: AIMS: Facial and Oral Movements Muscles of Facial Expression: None, normal Lips and Perioral Area: None, normal Jaw: None, normal Tongue: None, normal,Extremity Movements Upper (arms, wrists, hands, fingers): None, normal Lower (legs, knees, ankles, toes): None, normal, Trunk Movements Neck, shoulders, hips: None, normal, Overall Severity Severity of abnormal movements (highest score from questions above): None, normal Incapacitation due to abnormal movements: None, normal Patient's awareness of abnormal movements (rate only patient's report): No Awareness, Dental Status Current problems with teeth and/or dentures?: No Does patient usually wear dentures?: No  CIWA:    COWS:      Musculoskeletal: Strength & Muscle Tone: within normal limits Gait & Station: normal Patient leans: N/A  Psychiatric Specialty Exam: See SRA by MD Physical Exam  Nursing note and vitals reviewed. Constitutional: She is oriented to person, place, and time. She appears well-developed.  Neurological: She is alert and oriented to person, place, and time.  Psychiatric: She has a normal mood and affect. Her behavior is normal.    Review of Systems  Psychiatric/Behavioral: Positive for substance abuse. Negative for depression (stable) and suicidal ideas. The patient is not nervous/anxious (stable).     Blood pressure 133/86, pulse 94, temperature 97.9 F (36.6 C), temperature source Oral, resp. rate 18, height 5\' 7"  (1.702 m), weight 89.8 kg (198 lb), not currently breastfeeding.Body mass index is 31.01 kg/m.  Have you used any form of tobacco in the last 30 days? (Cigarettes, Smokeless Tobacco, Cigars, and/or Pipes): No  Has this patient used any form of tobacco in the last 30 days? (Cigarettes, Smokeless Tobacco, Cigars, and/or Pipes) Yes, No  Blood Alcohol level:  Lab Results  Component Value Date   ETH <10 03/30/2017   ETH <10 03/28/2017    Metabolic Disorder Labs:  Lab Results  Component Value Date   HGBA1C 5.4 03/31/2017   MPG 108.28 03/31/2017   MPG 108 05/07/2016   Lab Results  Component Value Date   PROLACTIN 86.9 (H) 05/07/2016   PROLACTIN 19.4 05/06/2016   Lab Results  Component Value Date   CHOL 201 (H) 03/31/2017   TRIG 122 03/31/2017   HDL 42 03/31/2017   CHOLHDL 4.8 03/31/2017   VLDL 24 03/31/2017   LDLCALC 135 (H) 03/31/2017   LDLCALC 96 05/06/2016    See Psychiatric Specialty Exam and Suicide Risk Assessment completed by Attending Physician prior to discharge.  Discharge destination:  Home reports she will follow-up with [facility]   Is patient on multiple antipsychotic therapies at discharge:  No   Has Patient had three or more failed trials of  antipsychotic monotherapy by history:  No  Recommended Plan for Multiple Antipsychotic Therapies: NA  Discharge Instructions    Diet - low sodium heart healthy   Complete by:  As directed    Diet - low sodium heart healthy   Complete by:  As directed    Discharge instructions   Complete by:  As directed    Take all medications as prescribed. Keep all follow-up appointments as scheduled.  Do not consume alcohol or use illegal drugs while on prescription medications. Report any adverse effects from your medications to your primary care provider promptly.  In the event of recurrent symptoms or worsening symptoms, call 911, a crisis hotline, or go to the nearest emergency department for evaluation.  Increase activity slowly   Complete by:  As directed    Increase activity slowly   Complete by:  As directed      Allergies as of 04/04/2017      Reactions   Penicillins Rash, Other (See Comments)   CHILDHOOD ALLERGY Has patient had a PCN reaction causing immediate rash, facial/tongue/throat swelling, SOB or lightheadedness with hypotension: Yes Has patient had a PCN reaction causing severe rash involving mucus membranes or skin necrosis: No Has patient had a PCN reaction that required hospitalization No Has patient had a PCN reaction occurring within the last 10 years: No If all of the above answers are "NO", then may proceed with Cephalosporin use.      Medication List    STOP taking these medications   busPIRone 15 MG tablet Commonly known as:  BUSPAR   ELIQUIS STARTER PACK 5 MG Tabs   gabapentin 100 MG capsule Commonly known as:  NEURONTIN   gabapentin 300 MG capsule Commonly known as:  NEURONTIN   lamoTRIgine 25 MG tablet Commonly known as:  LAMICTAL   lithium carbonate 300 MG capsule   paliperidone 9 MG 24 hr tablet Commonly known as:  INVEGA   PARoxetine 20 MG tablet Commonly known as:  PAXIL   prazosin 2 MG capsule Commonly known as:  MINIPRESS     TAKE  these medications     Indication  benztropine 0.5 MG tablet Commonly known as:  COGENTIN Take 1 tablet (0.5 mg total) by mouth daily. Start taking on:  04/05/2017  Indication:  Extrapyramidal Reaction caused by Medications   labetalol 200 MG tablet Commonly known as:  NORMODYNE Take 1 tablet (200 mg total) by mouth 2 (two) times daily. What changed:    medication strength  how much to take  how to take this  when to take this  additional instructions  Indication:  High Blood Pressure Disorder   QUEtiapine 300 MG tablet Commonly known as:  SEROQUEL Take 2 tablets (600 mg total) by mouth at bedtime. What changed:    how much to take  when to take this  Indication:  Depressive Phase of Manic-Depression   QUEtiapine 50 MG tablet Commonly known as:  SEROQUEL Take 1 tablet (50 mg total) by mouth 2 (two) times daily. What changed:  You were already taking a medication with the same name, and this prescription was added. Make sure you understand how and when to take each.  Indication:  Depressive Phase of Manic-Depression   traZODone 100 MG tablet Commonly known as:  DESYREL Take 1 tablet (100 mg total) by mouth at bedtime as needed for sleep. What changed:    medication strength  how much to take  when to take this  reasons to take this  Indication:  Trouble Sleeping   XARELTO 20 MG Tabs tablet Generic drug:  rivaroxaban Take 20 mg by mouth daily.  Indication:  Blockage of Blood Vessel to Lung by a Particle      Follow-up Information    [facility] Follow up on 04/07/2017.   Specialty:  Family Medicine Why:  Monday, 04/07/17 at 2:30pm.  Contact information: [address] [place] [address] [phone]           Follow-up recommendations:  Activity:  as tolerated Diet:  heart healthy  Comments:  Take all medications as prescribed. Keep all follow-up appointments as scheduled.  Do not consume alcohol or  use illegal drugs while  on prescription medications. Report any adverse effects from your medications to your primary care provider promptly.  In the event of recurrent symptoms or worsening symptoms, call 911, a crisis hotline, or go to the nearest emergency department for evaluation.   Signed: Oneta Rack, NP 04/04/2017, 1:22 PM  Patient seen, Suicide Assessment Completed.  Disposition Plan Reviewed

## 2017-04-04 NOTE — [facility] Suicide Risk Assessment (Addendum)
[facility]   Principal Problem: Schizoaffective disorder [facility](HCC) Discharge Diagnoses:  Patient Active Problem List   Diagnosis Date Noted  . Schizoaffective disorder (HCC) [F25.9] 03/30/2017  . Adjustment disorder with disturbance of emotion [F43.29] 03/29/2017  . Alcohol abuse with alcohol-induced mood disorder (HCC) [F10.14] 01/18/2017  . Anxiety and depression [F41.9, F32.9] 01/14/2017  . Pulmonary embolism (HCC) [I26.99] 12/19/2016  . Alcohol withdrawal (HCC) [F10.239] 12/01/2016  . ADD (attention deficit disorder) [F98.8] 05/10/2016  . Opioid use disorder, mild, in controlled environment (HCC) [F11.10] 05/06/2016  . Essential hypertension [I10] 05/06/2016  . Alcohol use disorder, moderate, in early remission, dependence (HCC) [F10.21] 05/06/2016  . Bipolar I disorder, most recent episode manic, severe with psychotic features (HCC) [F31.2] 01/20/2016  . Chronic pain syndrome [G89.4]     Total Time spent with patient: 30 minutes  Musculoskeletal: Strength & Muscle Tone: within normal limits Gait & Station: normal Patient leans: N/A  Psychiatric Specialty Exam: ROS denies headache, no chest pain, no shortness of breath, no vomiting  Blood pressure 123/83, pulse 96, temperature 97.9 F (36.6 C), temperature source Oral, resp. rate 18, height 5\' 7"  (1.702 m), weight 89.8 kg (198 lb), not currently breastfeeding.Body mass index is 31.01 kg/m.  General Appearance: Well Groomed  Eye Contact::  Good  Speech:  Normal Rate409  Volume:  Normal  Mood:  improved, euthymic today  Affect:  Appropriate and fuller in range  Thought Process:  Linear and Descriptions of Associations: Intact  Orientation:  Full (Time, Place, and Person)  Thought Content:  no hallucinations, no delusions, not internally preoccupied   Suicidal Thoughts:  No denies suicidal or self injurious ideations, no homicidal or violent ideations  Homicidal Thoughts:  No  Memory:  recent and  remote grossly intact   Judgement:  Other:  improving   Insight:  improved   Psychomotor Activity:  Normal  Concentration:  Good  Recall:  Good  Fund of Knowledge:Good  Language: Good  Akathisia:  Negative  Handed:  Right  AIMS (if indicated):   no current abnormal or involuntary movements noted or reported   Assets:  Communication Skills Desire for Improvement Resilience  Sleep:  Number of Hours: 6.5  Cognition: WNL  ADL's:  Intact   Mental Status Per Nursing Assessment::   On Admission:  Suicidal ideation indicated by patient, Self-harm behaviors  Demographic Factors:  42 year old , divorced, lives with BF and their two children, unemployed   Loss Factors: Family, financial stressors, limited resources   Historical Factors: Has been diagnosed with Schizoaffective Disorder and history of opiate and alcohol use disorder, now in remission  Risk Reduction Factors:   Responsible for children under 42 years of age, Living with another person, especially a relative and Positive coping skills or problem solving skills  Continued Clinical Symptoms:  At this time patient is alert, attentive, well related, pleasant, mood is described as improved and today presents euthymic, with a full range of affect, no thought disorder, no suicidal or self injurious ideations, no homicidal or violent ideations, no psychotic symptoms, future oriented . Behavior on unit in good control, no disruptive or agitated behaviors, pleasant on approach. Currently tolerating medications well, states she feels better after medication adjustments   Cognitive Features That Contribute To Risk:  No gross cognitive deficits noted upon discharge. Is alert , attentive, and oriented x 3    Suicide Risk:  Mild:  Suicidal ideation of limited frequency, intensity, duration, and specificity.  There are  no identifiable plans, no associated intent, mild dysphoria and related symptoms, good self-control (both objective and  subjective assessment), few other risk factors, and identifiable protective factors, including available and accessible social support.  Follow-up Information    [facility] Follow up on 04/07/2017.   Specialty:  Family Medicine Why:  Monday, 04/07/17 at 2:30pm.  Contact information: [address] [place] [address] [phone]           Plan Of Care/Follow-up recommendations:  Activity:  as tolerated  Diet:  heart healthy Tests:  NA Other:  See below  Patient is expressing readiness for discharge and is leaving unit in good spirits  Plans to return home Plans to follow up as above  She has an established PCP for management of medical issues.  Craige Cotta, MD 04/04/2017, 7:45 AM

## 2017-04-04 NOTE — Progress Notes (Signed)
  [facility] :  Will you be returning to the same living situation after discharge:  Yes,  own home At discharge, do you have transportation home?: Yes,  mother Do you have the ability to pay for your medications: Yes,  medicaid  Release of information consent forms completed and in the chart;  Patient's signature needed at discharge.  Patient to Follow up at: Follow-up Information    [facility] Follow up on 04/07/2017.   Specialty:  Family Medicine Why:  Monday, 04/07/17 at 2:30pm.  Contact information: [address] [place] [address] [place] [id number] [phone]           Next level of care provider has access to [facility] Link:no  Safety Planning and Suicide Prevention discussed: Yes,  with significant other  Have you used any form of tobacco in the last 30 days? (Cigarettes, Smokeless Tobacco, Cigars, and/or Pipes): No  Has patient been referred to the Quitline?: N/A patient is not a smoker  Patient has been referred for addiction treatment: Yes  Lorri FrederickWierda, Jessalyn Hinojosa Jon, LCSW 04/04/2017, 9:51 AM

## 2017-04-14 ENCOUNTER — Inpatient Hospital Stay (HOSPITAL_COMMUNITY)
Admission: RE | Admit: 2017-04-14 | Discharge: 2017-04-18 | DRG: 885 | Disposition: A | Discharge status: Home / Self Care | Payer: Medicaid Other | Attending: Psychiatry | Admitting: Psychiatry

## 2017-04-14 ENCOUNTER — Emergency Department (HOSPITAL_COMMUNITY)
Admission: EM | Admit: 2017-04-14 | Discharge: 2017-04-14 | Disposition: A | Discharge status: Home / Self Care | Payer: Medicaid Other | Attending: Emergency Medicine | Admitting: Emergency Medicine

## 2017-04-14 ENCOUNTER — Other Ambulatory Visit: Payer: Self-pay

## 2017-04-14 ENCOUNTER — Encounter (HOSPITAL_COMMUNITY): Payer: Self-pay | Admitting: Emergency Medicine

## 2017-04-14 ENCOUNTER — Encounter (HOSPITAL_COMMUNITY): Payer: Self-pay | Admitting: *Deleted

## 2017-04-14 DIAGNOSIS — Y903 Blood alcohol level of 60-79 mg/100 ml: Secondary | ICD-10-CM | POA: Insufficient documentation

## 2017-04-14 DIAGNOSIS — F250 Schizoaffective disorder, bipolar type: Secondary | ICD-10-CM | POA: Diagnosis not present

## 2017-04-14 DIAGNOSIS — F419 Anxiety disorder, unspecified: Secondary | ICD-10-CM | POA: Diagnosis present

## 2017-04-14 DIAGNOSIS — Z79899 Other long term (current) drug therapy: Secondary | ICD-10-CM | POA: Insufficient documentation

## 2017-04-14 DIAGNOSIS — F319 Bipolar disorder, unspecified: Secondary | ICD-10-CM | POA: Insufficient documentation

## 2017-04-14 DIAGNOSIS — A6000 Herpesviral infection of urogenital system, unspecified: Secondary | ICD-10-CM | POA: Diagnosis present

## 2017-04-14 DIAGNOSIS — G4700 Insomnia, unspecified: Secondary | ICD-10-CM | POA: Diagnosis not present

## 2017-04-14 DIAGNOSIS — Z818 Family history of other mental and behavioral disorders: Secondary | ICD-10-CM

## 2017-04-14 DIAGNOSIS — R45851 Suicidal ideations: Secondary | ICD-10-CM | POA: Diagnosis not present

## 2017-04-14 DIAGNOSIS — R45850 Homicidal ideations: Secondary | ICD-10-CM | POA: Insufficient documentation

## 2017-04-14 DIAGNOSIS — F329 Major depressive disorder, single episode, unspecified: Secondary | ICD-10-CM | POA: Insufficient documentation

## 2017-04-14 DIAGNOSIS — R450 Nervousness: Secondary | ICD-10-CM | POA: Diagnosis not present

## 2017-04-14 DIAGNOSIS — R443 Hallucinations, unspecified: Secondary | ICD-10-CM

## 2017-04-14 DIAGNOSIS — Z7901 Long term (current) use of anticoagulants: Secondary | ICD-10-CM | POA: Insufficient documentation

## 2017-04-14 DIAGNOSIS — F10231 Alcohol dependence with withdrawal delirium: Secondary | ICD-10-CM | POA: Insufficient documentation

## 2017-04-14 DIAGNOSIS — I10 Essential (primary) hypertension: Secondary | ICD-10-CM | POA: Diagnosis present

## 2017-04-14 DIAGNOSIS — G894 Chronic pain syndrome: Secondary | ICD-10-CM | POA: Diagnosis present

## 2017-04-14 DIAGNOSIS — F4329 Adjustment disorder with other symptoms: Secondary | ICD-10-CM | POA: Diagnosis present

## 2017-04-14 DIAGNOSIS — F1910 Other psychoactive substance abuse, uncomplicated: Secondary | ICD-10-CM | POA: Diagnosis not present

## 2017-04-14 DIAGNOSIS — R441 Visual hallucinations: Secondary | ICD-10-CM | POA: Insufficient documentation

## 2017-04-14 DIAGNOSIS — Z811 Family history of alcohol abuse and dependence: Secondary | ICD-10-CM | POA: Diagnosis not present

## 2017-04-14 DIAGNOSIS — F10239 Alcohol dependence with withdrawal, unspecified: Secondary | ICD-10-CM | POA: Diagnosis present

## 2017-04-14 DIAGNOSIS — R440 Auditory hallucinations: Secondary | ICD-10-CM | POA: Insufficient documentation

## 2017-04-14 LAB — ETHANOL: Alcohol, Ethyl (B): 70 mg/dL — ABNORMAL HIGH (ref <10)

## 2017-04-14 LAB — COMPREHENSIVE METABOLIC PANEL
ALBUMIN: 4.1 g/dL (ref 3.5–5.0)
ALT: 70 U/L — AB (ref 14–54)
AST: 31 U/L (ref 15–41)
Alkaline Phosphatase: 91 U/L (ref 38–126)
Anion gap: 10 (ref 5–15)
BUN: 20 mg/dL (ref 6–20)
CHLORIDE: 105 mmol/L (ref 101–111)
CO2: 23 mmol/L (ref 22–32)
CREATININE: 0.73 mg/dL (ref 0.44–1.00)
Calcium: 9 mg/dL (ref 8.9–10.3)
GFR calc Af Amer: >60 mL/min (ref >60)
GFR calc non Af Amer: >60 mL/min (ref >60)
GLUCOSE: 101 mg/dL — AB (ref 65–99)
Potassium: 4.2 mmol/L (ref 3.5–5.1)
Sodium: 138 mmol/L (ref 135–145)
Total Bilirubin: 0.3 mg/dL (ref 0.3–1.2)
Total Protein: 7.5 g/dL (ref 6.5–8.1)

## 2017-04-14 LAB — RAPID URINE DRUG SCREEN, HOSP PERFORMED
AMPHETAMINES: NOT DETECTED
BARBITURATES: NOT DETECTED
Benzodiazepines: NOT DETECTED
Cocaine: NOT DETECTED
Opiates: NOT DETECTED
TETRAHYDROCANNABINOL: NOT DETECTED

## 2017-04-14 LAB — ACETAMINOPHEN LEVEL: Acetaminophen (Tylenol), Serum: <10 ug/mL — ABNORMAL LOW (ref 10–30)

## 2017-04-14 LAB — CBC
HEMATOCRIT: 39.7 % (ref 36.0–46.0)
Hemoglobin: 13.4 g/dL (ref 12.0–15.0)
MCH: 28.8 pg (ref 26.0–34.0)
MCHC: 33.8 g/dL (ref 30.0–36.0)
MCV: 85.2 fL (ref 78.0–100.0)
Platelets: 260 10*3/uL (ref 150–400)
RBC: 4.66 MIL/uL (ref 3.87–5.11)
RDW: 14.1 % (ref 11.5–15.5)
WBC: 9.5 10*3/uL (ref 4.0–10.5)

## 2017-04-14 LAB — I-STAT BETA HCG BLOOD, ED (MC, WL, AP ONLY): I-stat hCG, quantitative: <5 m[IU]/mL (ref <5)

## 2017-04-14 LAB — SALICYLATE LEVEL: Salicylate Lvl: <7 mg/dL (ref 2.8–30.0)

## 2017-04-14 MED ORDER — ADULT MULTIVITAMIN W/MINERALS CH
1.0000 | ORAL_TABLET | Freq: Every day | ORAL | Status: DC
  Administered 2017-04-14 – 2017-04-18 (×5): 1 via ORAL
  Filled 2017-04-14 (×8): qty 1

## 2017-04-14 MED ORDER — LORAZEPAM 1 MG PO TABS: 0.0000 mg | ORAL_TABLET | Freq: Two times a day (BID) | ORAL | Status: DC

## 2017-04-14 MED ORDER — THIAMINE HCL 100 MG/ML IJ SOLN: 100.0000 mg | Freq: Every day | INTRAMUSCULAR | Status: DC

## 2017-04-14 MED ORDER — LORAZEPAM 1 MG PO TABS
1.0000 mg | ORAL_TABLET | Freq: Four times a day (QID) | ORAL | Status: AC
  Administered 2017-04-14 – 2017-04-15 (×6): 1 mg via ORAL
  Filled 2017-04-14 (×6): qty 1

## 2017-04-14 MED ORDER — LORAZEPAM 1 MG PO TABS
1.0000 mg | ORAL_TABLET | Freq: Every day | ORAL | Status: AC
  Administered 2017-04-18: 1 mg via ORAL
  Filled 2017-04-14: qty 1

## 2017-04-14 MED ORDER — LORAZEPAM 1 MG PO TABS
1.0000 mg | ORAL_TABLET | Freq: Four times a day (QID) | ORAL | Status: AC | PRN
  Administered 2017-04-16 – 2017-04-17 (×2): 1 mg via ORAL
  Filled 2017-04-14 (×2): qty 1

## 2017-04-14 MED ORDER — LOPERAMIDE HCL 2 MG PO CAPS: 2.0000 mg | ORAL_CAPSULE | ORAL | Status: AC | PRN

## 2017-04-14 MED ORDER — THIAMINE HCL 100 MG/ML IJ SOLN
100.0000 mg | Freq: Once | INTRAMUSCULAR | Status: AC
  Administered 2017-04-14: 100 mg via INTRAMUSCULAR
  Filled 2017-04-14: qty 2

## 2017-04-14 MED ORDER — QUETIAPINE FUMARATE 50 MG PO TABS
50.0000 mg | ORAL_TABLET | Freq: Two times a day (BID) | ORAL | Status: DC
  Administered 2017-04-14: 50 mg via ORAL
  Filled 2017-04-14: qty 1

## 2017-04-14 MED ORDER — BENZTROPINE MESYLATE 0.5 MG PO TABS
0.5000 mg | ORAL_TABLET | Freq: Every day | ORAL | Status: DC
  Administered 2017-04-14: 0.5 mg via ORAL
  Filled 2017-04-14: qty 1

## 2017-04-14 MED ORDER — HYDROXYZINE HCL 25 MG PO TABS
25.0000 mg | ORAL_TABLET | Freq: Four times a day (QID) | ORAL | Status: DC | PRN
  Administered 2017-04-14 – 2017-04-16 (×3): 25 mg via ORAL
  Filled 2017-04-14 (×3): qty 1

## 2017-04-14 MED ORDER — SODIUM CHLORIDE 0.9 % IV BOLUS (SEPSIS)
2000.0000 mL | Freq: Once | INTRAVENOUS | Status: AC
  Administered 2017-04-14: 2000 mL via INTRAVENOUS

## 2017-04-14 MED ORDER — QUETIAPINE FUMARATE 50 MG PO TABS
150.0000 mg | ORAL_TABLET | Freq: Every evening | ORAL | Status: DC | PRN
  Administered 2017-04-14 (×2): 150 mg via ORAL
  Filled 2017-04-14 (×8): qty 1

## 2017-04-14 MED ORDER — SODIUM CHLORIDE 0.9 % IV SOLN: INTRAVENOUS | Status: DC

## 2017-04-14 MED ORDER — LORAZEPAM 1 MG PO TABS
1.0000 mg | ORAL_TABLET | Freq: Three times a day (TID) | ORAL | Status: AC
  Administered 2017-04-16 (×3): 1 mg via ORAL
  Filled 2017-04-14 (×3): qty 1

## 2017-04-14 MED ORDER — QUETIAPINE FUMARATE 300 MG PO TABS: 600.0000 mg | ORAL_TABLET | Freq: Every day | ORAL | Status: DC

## 2017-04-14 MED ORDER — LORAZEPAM 1 MG PO TABS
1.0000 mg | ORAL_TABLET | Freq: Two times a day (BID) | ORAL | Status: DC
  Administered 2017-04-17: 1 mg via ORAL
  Filled 2017-04-14: qty 1

## 2017-04-14 MED ORDER — LORAZEPAM 2 MG/ML IJ SOLN: 0.0000 mg | Freq: Four times a day (QID) | INTRAMUSCULAR | Status: DC

## 2017-04-14 MED ORDER — ONDANSETRON 4 MG PO TBDP: 4.0000 mg | ORAL_TABLET | Freq: Four times a day (QID) | ORAL | Status: AC | PRN

## 2017-04-14 MED ORDER — VITAMIN B-1 100 MG PO TABS
100.0000 mg | ORAL_TABLET | Freq: Every day | ORAL | Status: DC
  Administered 2017-04-15 – 2017-04-18 (×4): 100 mg via ORAL
  Filled 2017-04-14 (×6): qty 1

## 2017-04-14 MED ORDER — HALOPERIDOL 5 MG PO TABS: 5.0000 mg | ORAL_TABLET | Freq: Four times a day (QID) | ORAL | Status: DC | PRN

## 2017-04-14 MED ORDER — LABETALOL HCL 200 MG PO TABS
200.0000 mg | ORAL_TABLET | Freq: Two times a day (BID) | ORAL | Status: DC
  Administered 2017-04-14: 200 mg via ORAL
  Filled 2017-04-14: qty 1

## 2017-04-14 MED ORDER — LORAZEPAM 1 MG PO TABS
0.0000 mg | ORAL_TABLET | Freq: Four times a day (QID) | ORAL | Status: DC
  Administered 2017-04-14: 2 mg via ORAL
  Filled 2017-04-14: qty 2

## 2017-04-14 MED ORDER — RIVAROXABAN 20 MG PO TABS
20.0000 mg | ORAL_TABLET | Freq: Every day | ORAL | Status: DC
  Administered 2017-04-14: 20 mg via ORAL
  Filled 2017-04-14: qty 1

## 2017-04-14 MED ORDER — BENZTROPINE MESYLATE 1 MG PO TABS: 1.0000 mg | ORAL_TABLET | Freq: Four times a day (QID) | ORAL | Status: DC | PRN

## 2017-04-14 MED ORDER — TRAZODONE HCL 100 MG PO TABS: 100.0000 mg | ORAL_TABLET | Freq: Every evening | ORAL | Status: DC | PRN

## 2017-04-14 MED ORDER — VITAMIN B-1 100 MG PO TABS
100.0000 mg | ORAL_TABLET | Freq: Every day | ORAL | Status: DC
  Administered 2017-04-14: 100 mg via ORAL
  Filled 2017-04-14: qty 1

## 2017-04-14 MED ORDER — LORAZEPAM 2 MG/ML IJ SOLN
2.0000 mg | Freq: Once | INTRAMUSCULAR | Status: AC
  Administered 2017-04-14: 2 mg via INTRAVENOUS
  Filled 2017-04-14: qty 1

## 2017-04-14 MED ORDER — LORAZEPAM 2 MG/ML IJ SOLN: 0.0000 mg | Freq: Two times a day (BID) | INTRAMUSCULAR | Status: DC

## 2017-04-14 NOTE — Progress Notes (Signed)
Behavioral Health Medical Screening Exam  Megan Edwards is an 42 y.o. female.  Total Time spent with patient: 30 minutes  Psychiatric Specialty Exam: Physical Exam  Constitutional: She is oriented to person, place, and time. She appears well-developed and well-nourished.  HENT:  Head: Normocephalic and atraumatic.  Eyes: Conjunctivae and EOM are normal. Pupils are equal, round, and reactive to light.  Neck: Normal range of motion. Neck supple.  Cardiovascular: Normal heart sounds and intact distal pulses.  tachycardic  Respiratory: Effort normal and breath sounds normal. No respiratory distress. She has no wheezes. She exhibits no tenderness.  GI: Soft.  Musculoskeletal: Normal range of motion.  Neurological: She is alert and oriented to person, place, and time. She has normal reflexes.  Skin: Skin is warm and dry. No rash noted.    Review of Systems  Constitutional: Positive for malaise/fatigue. Negative for chills, diaphoresis, fever and weight loss.  HENT: Negative.  Negative for congestion, hearing loss, nosebleeds and sore throat.   Eyes: Negative.  Negative for blurred vision.  Respiratory: Negative.  Negative for cough, shortness of breath and wheezing.   Cardiovascular: Negative.  Negative for chest pain, palpitations and PND.  Gastrointestinal: Positive for diarrhea, heartburn, nausea and vomiting. Negative for abdominal pain and constipation.  Genitourinary: Negative for dysuria.  Musculoskeletal: Positive for myalgias. Negative for falls.  Skin: Negative.   Neurological: Positive for dizziness and weakness. Negative for tingling, focal weakness, seizures, loss of consciousness and headaches.  Psychiatric/Behavioral: Positive for depression, hallucinations, substance abuse and suicidal ideas. The patient is nervous/anxious and has insomnia.     not currently breastfeeding.There is no height or weight on file to calculate BMI.  General Appearance: Disheveled  Eye  Contact:  Fair  Speech:  Clear and Coherent and Pressured  Volume:  Increased  Mood:  Anxious, Dysphoric and Hopeless  Affect:  Congruent, Inappropriate and Labile  Thought Process:  Coherent, Linear and Descriptions of Associations: Intact  Orientation:  Full (Time, Place, and Person)  Thought Content:  Hallucinations: Command:  telling her to hurt self and others, being poisoned, Paranoid Ideation and Rumination  Suicidal Thoughts:  Yes.  without intent/plan  Homicidal Thoughts:  Yes.  without intent/plan, hearing voices telling her to poison but not acting on them  Memory:  Immediate;   Fair Recent;   Fair Remote;   Fair  Judgement:  Poor  Insight:  Lacking  Psychomotor Activity:  Mannerisms  Concentration: Concentration: Fair and Attention Span: Fair  Recall:  [employer]Fair  Fund of Knowledge:Fair  Language: Fair  Akathisia:  No  Handed:  Right  AIMS (if indicated):     Assets:  Desire for Improvement Housing Social Support  Sleep:       Musculoskeletal: Strength & Muscle Tone: within normal limits Gait & Station: unsteady Patient leans: N/A  not currently breastfeeding.  Recommendations: Patient is psychotic, hearing voices telling her that she is being poisoned and to poison others. Patient is tachycardic, has h/o PE, is vomiting and needs medical stabilization. Once stable, needs inpatient psychiatric admission Based on my evaluation the patient appears to have an emergency medical condition for which I recommend the patient be transferred to the emergency department for further evaluation.  Nelly RoutArchana Meldon Hanzlik, MD 04/14/2017, 9:27 AM

## 2017-04-14 NOTE — ED Notes (Signed)
Pt has three belongings bags and orange duffle bag by nurses' station by RES B.

## 2017-04-14 NOTE — ED Notes (Signed)
[facility]  [facility] called for transport.
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
## 2017-04-14 NOTE — Tx Team (Signed)
Initial Treatment Plan 04/14/2017 5:39 PM Pablo LawrenceKelly L Monsivais [id number]    PATIENT STRESSORS: Marital or family conflict Medication change or noncompliance Substance abuse   PATIENT STRENGTHS: Ability for insight Average or above average intelligence Capable of independent living General fund of knowledge Motivation for treatment/growth   PATIENT IDENTIFIED PROBLEMS: Depression Substance Abuse Suicidal thoughts Auditory hallucinations "How to get voices to stop" "Stay clean"                     DISCHARGE CRITERIA:  Ability to meet basic life and health needs Improved stabilization in mood, thinking, and/or behavior Verbal commitment to aftercare and medication compliance Withdrawal symptoms are absent or subacute and managed without 24-hour nursing intervention  PRELIMINARY DISCHARGE PLAN: Attend aftercare/continuing care group Return to previous living arrangement  PATIENT/FAMILY INVOLVEMENT: This treatment plan has been presented to and reviewed with the patient, Myrla HalstedKelly L Hosick, and/or family member, .  The patient and family have been given the opportunity to ask questions and make suggestions.  Nochum Fenter, [place], [place]RN 04/14/2017, 5:39 PM

## 2017-04-14 NOTE — ED Provider Notes (Signed)
[facility]-EMERGENCY DEPT Provider Note   CSN: [id number] Arrival date & time: 04/14/17  0944     History   Chief Complaint Chief Complaint  Patient presents with  . Hallucinations  . Delirium Tremens (DTS)    HPI Megan Edwards is a 42 y.o. female.  42 year old female with history of alcohol abuse and schizoaffective disorder presents with suicidal ideations with plan to jump off a bridge.  Patient states that she normally drinks 9 shots of liquor a day and that her last consumption was about 12 hours ago.  Has had increased auditory as well as visual hallucinations.  Denies any tactile hallucinations.  Denies any current suicide attempt.  She also has homicidal ideations towards her relative due to the voices telling her to harm that person.  Denies any current history of drug use.  States that her psychiatrist has been adjusting her medications.      Past Medical History:  Diagnosis Date  . Abnormal Pap smear   . Alcohol abuse   . Anxiety and depression 01/14/2017  . Bipolar 1 disorder (HCC)   . Depression   . Genital herpes 2000  . Hypertension   . Infection    UTI  . Ovarian cyst   . Schizoaffective disorder (HCC) 05/05/2016  . SVT (supraventricular tachycardia) [facility](HCC)     Patient Active Problem List   Diagnosis Date Noted  . Schizoaffective disorder (HCC) 03/30/2017  . Adjustment disorder with disturbance of emotion 03/29/2017  . Alcohol abuse with alcohol-induced mood disorder (HCC) 01/18/2017  . Anxiety and depression 01/14/2017  . Pulmonary embolism (HCC) 12/19/2016  . Alcohol withdrawal (HCC) 12/01/2016  . ADD (attention deficit disorder) 05/10/2016  . Opioid use disorder, mild, in controlled environment (HCC) 05/06/2016  . Essential hypertension 05/06/2016  . Alcohol use disorder, moderate, in early remission, dependence (HCC) 05/06/2016  . Bipolar I disorder, most recent episode manic, severe with psychotic features (HCC)  01/20/2016  . Chronic pain syndrome     Past Surgical History:  Procedure Laterality Date  . addenoidectomy    . BREAST ENHANCEMENT SURGERY    . BREAST SURGERY    . KNEE SURGERY    . KNEE SURGERY Left   . TONSILLECTOMY    . WISDOM TOOTH EXTRACTION      OB History    Gravida Para Term Preterm AB Living   5 2 2  0 3 2   SAB TAB Ectopic Multiple Live Births   1 2 0 0 2       Home Medications    Prior to Admission medications   Medication Sig Start Date End Date Taking? Authorizing Provider  benztropine (COGENTIN) 0.5 MG tablet Take 1 tablet (0.5 mg total) by mouth daily. 04/05/17   Oneta RackLewis, Tanika N, NP  labetalol (NORMODYNE) 200 MG tablet Take 1 tablet (200 mg total) by mouth 2 (two) times daily. 04/04/17   Oneta RackLewis, Tanika N, NP  QUEtiapine (SEROQUEL) 300 MG tablet Take 2 tablets (600 mg total) by mouth at bedtime. 04/04/17   Oneta RackLewis, Tanika N, NP  QUEtiapine (SEROQUEL) 50 MG tablet Take 1 tablet (50 mg total) by mouth 2 (two) times daily. 04/04/17   Oneta RackLewis, Tanika N, NP  traZODone (DESYREL) 100 MG tablet Take 1 tablet (100 mg total) by mouth at bedtime as needed for sleep. 04/04/17   Oneta RackLewis, Tanika N, NP  XARELTO 20 MG TABS tablet Take 20 mg by mouth daily. 02/13/17   [provider]    Family  History Family History  Problem Relation Age of Onset  . Alcoholism Unknown   . Leukemia Unknown   . Cervical cancer Unknown   . Bone cancer Unknown   . Cirrhosis Unknown   . Diabetes Mellitus II Unknown   . Depression Unknown   . Hypertension Unknown   . Thyroid cancer Unknown   . Diabetes Mellitus II Mother   . Hypertension Mother   . Alcoholism Father   . Mental illness Paternal Grandmother     Social History Social History   Tobacco Use  . Smoking status: Never Smoker  . Smokeless tobacco: Never Used  Substance Use Topics  . Alcohol use: Yes    Alcohol/week: 0.0 oz    Comment: states has not drank alcohol since 12/19/16  . Drug use: Yes    Types: Methamphetamines      Comment: former-opiate use     Allergies   Penicillins   Review of Systems Review of Systems  All other systems reviewed and are negative.    Physical Exam Updated Vital Signs BP 127/84 (BP Location: Right Arm)   Pulse (!) 130   Temp (!) 97.4 F (36.3 C) (Oral)   Resp 20   SpO2 98%   Physical Exam  Constitutional: She is oriented to person, place, and time. She appears well-developed and well-nourished.  Non-toxic appearance. No distress.  HENT:  Head: Normocephalic and atraumatic.  Eyes: Conjunctivae, EOM and lids are normal. Pupils are equal, round, and reactive to light.  Neck: Normal range of motion. Neck supple. No tracheal deviation present. No thyroid mass present.  Cardiovascular: Normal rate, regular rhythm and normal heart sounds. Exam reveals no gallop.  No murmur heard. Pulmonary/Chest: Effort normal and breath sounds normal. No stridor. No respiratory distress. She has no decreased breath sounds. She has no wheezes. She has no rhonchi. She has no rales.  Abdominal: Soft. Normal appearance and bowel sounds are normal. She exhibits no distension. There is no tenderness. There is no rebound and no CVA tenderness.  Musculoskeletal: Normal range of motion. She exhibits no edema or tenderness.  Neurological: She is alert and oriented to person, place, and time. She has normal strength. No cranial nerve deficit or sensory deficit. GCS eye subscore is 4. GCS verbal subscore is 5. GCS motor subscore is 6.  Skin: Skin is warm and dry. No abrasion and no rash noted.  Psychiatric: Her affect is blunt. Her speech is delayed. She is slowed and actively hallucinating. She expresses homicidal and suicidal ideation. She expresses suicidal plans and homicidal plans. She is attentive.  Nursing note and vitals reviewed.    ED Treatments / Results  Labs (all labs ordered are listed, but only abnormal results are displayed) Labs Reviewed  COMPREHENSIVE METABOLIC PANEL   ETHANOL  SALICYLATE LEVEL  ACETAMINOPHEN LEVEL  CBC  RAPID URINE DRUG SCREEN, HOSP PERFORMED  I-STAT BETA HCG BLOOD, ED ([facility], [facility], [facility] ONLY)    EKG  EKG Interpretation None       Radiology No results found.  Procedures Procedures (including critical care time)  Medications Ordered in ED Medications  0.9 %  sodium chloride infusion (not administered)  sodium chloride 0.9 % bolus 2,000 mL (not administered)  LORazepam (ATIVAN) injection 2 mg (not administered)     Initial Impression / Assessment and Plan / ED Course  I have reviewed the triage vital signs and the nursing notes.  Pertinent labs & imaging results that were available during my care of the patient  were reviewed by me and considered in my medical decision making (see chart for details).    Patient given IV fluids as well as Ativan here and feels better.  Heart rate has improved.  She is not complaining of any shortness of breath or chest pain.  Do not think that patient has a PE.  Think that a lot of her anxiety was from possible dehydration versus anxiety.  Patient's alcohol withdrawal score was 15.  Patient reassessed multiple times and has no signs of DTs at this time.  She is medically clear for psychiatric disposition  Final Clinical Impressions(s) / ED Diagnoses   Final diagnoses:  None    ED Discharge Orders    None       Lorre Nick, MD 04/14/17 1227

## 2017-04-14 NOTE — ED Triage Notes (Addendum)
Pt verbalizes ongoing hallucinations for a while. Hallucinations telling her that they are going to kill her. Has not eaten related to thinking something is in the food but "drank a gallon of alcohol yesterday." Pt tachycardic in triage and verbalizes anxiety.   With triage pt verbalizing SI "to take pills so they don't kill me the way they want."

## 2017-04-14 NOTE — BH Assessment (Signed)
[facility] Assessment Progress Note  Per Nelly RoutArchana Kumar, MD, this pt requires psychiatric hospitalization at this time.  Lanny CrampLinsey S, RN, AC [facility]; [facility] will be ready to receive pt at 18:30.  Pt has signed Voluntary Admission and Consent for Treatment, as well as Consent to Release Information to her counselor, Real Consell Slaughter, and her physician, Dr Midge AverPavelock, and notification calls have been placed.  [facility].  Pt's nurse, Aram BeechamCynthia, has been notified, and agrees to send original paperwork along with pt via [facility] [place], and to call report to [phone].  Doylene Canninghomas Gage Weant, [place] Behavioral Health Coordinator [phone]

## 2017-04-14 NOTE — Progress Notes (Signed)
Megan Edwards is a 42 year old female pt admitted on voluntary basis. She reports feeling paranoid and thinking that her mother is doing things to her medications and spoke about how she has not been taking her medications as prescribed. She reports not sleeping well and spoke about how she self medicates with alcohol. She does endorse auditory hallucinations and reports she would like help in getting the voices to stop and help staying clean. She reports that she lives with the father of her child but reports that she is unsure if she wants to go back there. She denies any SI during admission process and is able to contract for safety while in the hospital. Megan Edwards was oriented to the unit and safety maintained.

## 2017-04-14 NOTE — ED Notes (Signed)
Patient moved to Acute Care area room 43.  Patient is alert, calm eating her lunch.  Reports she is having thoughts of killing her mother and taking pills and jumping [place] [address]  Patient was released from Behavioral Health to live with her mother, but patient states "it is not working out."  Patient also said that she started drinking because she was only sleeping one hour at a time and would wake up screaming.

## 2017-04-14 NOTE — Progress Notes (Signed)
Pt invited, but declined wrap-up group this evening.  

## 2017-04-14 NOTE — ED Notes (Signed)
[facility] Expected date:  Expected time:  Means of arrival:  Comments: RES B

## 2017-04-15 DIAGNOSIS — G4700 Insomnia, unspecified: Secondary | ICD-10-CM

## 2017-04-15 DIAGNOSIS — R450 Nervousness: Secondary | ICD-10-CM

## 2017-04-15 DIAGNOSIS — Z811 Family history of alcohol abuse and dependence: Secondary | ICD-10-CM

## 2017-04-15 DIAGNOSIS — F250 Schizoaffective disorder, bipolar type: Principal | ICD-10-CM

## 2017-04-15 DIAGNOSIS — R45851 Suicidal ideations: Secondary | ICD-10-CM

## 2017-04-15 DIAGNOSIS — Z818 Family history of other mental and behavioral disorders: Secondary | ICD-10-CM

## 2017-04-15 DIAGNOSIS — F1910 Other psychoactive substance abuse, uncomplicated: Secondary | ICD-10-CM

## 2017-04-15 DIAGNOSIS — F419 Anxiety disorder, unspecified: Secondary | ICD-10-CM

## 2017-04-15 MED ORDER — CHLORPROMAZINE HCL 25 MG PO TABS
50.0000 mg | ORAL_TABLET | ORAL | Status: DC | PRN
  Administered 2017-04-15: 50 mg via ORAL
  Filled 2017-04-15: qty 2

## 2017-04-15 MED ORDER — QUETIAPINE FUMARATE 50 MG PO TABS
150.0000 mg | ORAL_TABLET | Freq: Every day | ORAL | Status: DC
  Filled 2017-04-15: qty 3

## 2017-04-15 MED ORDER — CHLORPROMAZINE HCL 25 MG/ML IJ SOLN: 50.0000 mg | INTRAMUSCULAR | Status: DC | PRN

## 2017-04-15 MED ORDER — CHLORPROMAZINE HCL 50 MG PO TABS
50.0000 mg | ORAL_TABLET | Freq: Two times a day (BID) | ORAL | Status: DC
  Administered 2017-04-15 – 2017-04-17 (×5): 50 mg via ORAL
  Filled 2017-04-15: qty 1
  Filled 2017-04-15: qty 28
  Filled 2017-04-15 (×4): qty 1
  Filled 2017-04-15: qty 2
  Filled 2017-04-15: qty 1
  Filled 2017-04-15: qty 28
  Filled 2017-04-15: qty 1

## 2017-04-15 MED ORDER — RIVAROXABAN 20 MG PO TABS
20.0000 mg | ORAL_TABLET | Freq: Every day | ORAL | Status: DC
  Administered 2017-04-15 – 2017-04-18 (×4): 20 mg via ORAL
  Filled 2017-04-15 (×3): qty 1
  Filled 2017-04-15: qty 14
  Filled 2017-04-15: qty 1

## 2017-04-15 MED ORDER — OLANZAPINE 5 MG PO TBDP
5.0000 mg | ORAL_TABLET | Freq: Three times a day (TID) | ORAL | Status: DC | PRN
  Administered 2017-04-15: 5 mg via ORAL
  Filled 2017-04-15: qty 1

## 2017-04-15 MED ORDER — QUETIAPINE FUMARATE 300 MG PO TABS
600.0000 mg | ORAL_TABLET | Freq: Every day | ORAL | Status: DC
  Administered 2017-04-15 – 2017-04-17 (×3): 600 mg via ORAL
  Filled 2017-04-15 (×3): qty 2
  Filled 2017-04-15: qty 28
  Filled 2017-04-15: qty 2

## 2017-04-15 MED ORDER — BENZTROPINE MESYLATE 0.5 MG PO TABS
0.5000 mg | ORAL_TABLET | Freq: Two times a day (BID) | ORAL | Status: DC | PRN
  Administered 2017-04-15 – 2017-04-16 (×2): 0.5 mg via ORAL
  Filled 2017-04-15: qty 1
  Filled 2017-04-15: qty 28
  Filled 2017-04-15: qty 1

## 2017-04-15 NOTE — Progress Notes (Signed)
Did not attend group 
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
## 2017-04-15 NOTE — [facility] Group Notes (Signed)
[facility] Group Notes:  (Nursing/MHT/Case Management/Adjunct)  Date:  04/15/2017  Time:  10:29 AM  Type of Therapy:  Nurse Education  Participation Level:  Active  Participation Quality:  Intrusive and Monopolizing  Affect:  Angry, Defensive and Irritable  Cognitive:  Disorganized  Insight:  Lacking  Engagement in Group:  Defensive, Lacking and Resistant  Modes of Intervention:  Discussion, Limit-setting and Orientation  Summary of Progress/Problems:  Megan Edwards 04/15/2017, 10:29 AM

## 2017-04-15 NOTE — [facility] Suicide Risk Assessment (Signed)
[facility] Admission Suicide Risk Assessment   Nursing information obtained from:    Demographic factors:    Current Mental Status:    Loss Factors:    Historical Factors:    Risk Reduction Factors:     Total Time spent with patient: 1 hour Principal Problem: Bipolar I disorder, most recent episode manic, severe with psychotic features [facility]) Diagnosis:   Patient Active Problem List   Diagnosis Date Noted  . Schizoaffective disorder (HCC) [F25.9] 03/30/2017  . Adjustment disorder with disturbance of emotion [F43.29] 03/29/2017  . Alcohol abuse with alcohol-induced mood disorder (HCC) [F10.14] 01/18/2017  . Anxiety and depression [F41.9, F32.9] 01/14/2017  . Pulmonary embolism (HCC) [I26.99] 12/19/2016  . Alcohol withdrawal (HCC) [F10.239] 12/01/2016  . ADD (attention deficit disorder) [F98.8] 05/10/2016  . Opioid use disorder, mild, in controlled environment (HCC) [F11.10] 05/06/2016  . Essential hypertension [I10] 05/06/2016  . Alcohol use disorder, moderate, in early remission, dependence (HCC) [F10.21] 05/06/2016  . Bipolar I disorder, most recent episode manic, severe with psychotic features (HCC) [F31.2] 01/20/2016  . Chronic pain syndrome [G89.4]    Subjective Data:   Leanore Biggers is a 42 y/o F with history of schizoaffective disorder bipolar type who was admitted voluntarily with worsening depression, AH, delusions, and SI. Pt has relevant history of DC from [facility] about 10 days ago, and she reports that she was adherent to her medications after discharge. She experienced increasing AH, and she began use alcohol in response. She had delusions that she was being poisoned in her food and shampoo by her roommate, and she had intrusive thoughts of self harm and SI to jump from a bridge, so she returned to [facility] for additional treatment.  Upon evaluation, pt shares, "I've been hearing voices and hearing things on TV showing me that my family is poisoning me, and I can't get out of bed - I  have no motivation - I feel like arsenic is keeping me in bed." She endorses AH of multiple voices and she denies VH. She endorses SI with plan to jump from a bridge, and she denies HI. She reports anxiety, depressed mood, low motivation, low energy, poor concentration, and poor appetite. She reports poor sleep with initial insomnia. She denies other symptoms of mania, OCD, and PTSD. She reports drinking about "5 to 10 shots" of alcohol daily since her discharge.  Discussed with patient about treatment options. She reports taking seroquel 100mg  in the morning , 100mg  in the afternoon, and 600mg  at bedtime with good adherence since her discharge. Discussed with patient about addition of another antipsychotic medication to her regimen, and she was in agreement to attempt trial of thorazine. She had no further questions, comments, or concerns.   Continued Clinical Symptoms:  Alcohol Use Disorder Identification Test Final Score (AUDIT): 35 The "Alcohol Use Disorders Identification Test", Guidelines for Use in Primary Care, Second Edition.  World Science writer [employer]). Score between 0-7:  no or low risk or alcohol related problems. Score between 8-15:  moderate risk of alcohol related problems. Score between 16-19:  high risk of alcohol related problems. Score 20 or above:  warrants further diagnostic evaluation for alcohol dependence and treatment.   CLINICAL FACTORS:   Severe Anxiety and/or Agitation Bipolar Disorder:   Mixed State Schizophrenia:   Paranoid or undifferentiated type More than one psychiatric diagnosis Currently Psychotic   Musculoskeletal: Strength & Muscle Tone: within normal limits Gait & Station: normal Patient leans: N/A  Psychiatric Specialty Exam: Physical Exam  Nursing  note and vitals reviewed.   Review of Systems  Constitutional: Negative for chills and fever.  Respiratory: Negative for cough.   Cardiovascular: Negative for chest pain.  Gastrointestinal:  Negative for abdominal pain, heartburn, nausea and vomiting.  Psychiatric/Behavioral: Positive for depression, hallucinations, substance abuse and suicidal ideas. The patient is nervous/anxious.     Blood pressure (!) 110/56, pulse (!) 121, temperature 98.2 F (36.8 C), temperature source Oral, resp. rate 18, height 5\' 7"  (1.702 m), weight 99.8 kg (220 lb), not currently breastfeeding.Body mass index is 34.46 kg/m.  General Appearance: Casual and Fairly Groomed  Eye Contact:  Fair  Speech:  Clear and Coherent and Normal Rate  Volume:  Normal  Mood:  Anxious and Depressed  Affect:  Congruent, Constricted and Flat  Thought Process:  Coherent, Goal Directed and Descriptions of Associations: Loose  Orientation:  Full (Time, Place, and Person)  Thought Content:  Delusions, Hallucinations: Auditory and Ideas of Reference:   Paranoia Delusions  Suicidal Thoughts:  Yes.  with intent/plan  Homicidal Thoughts:  No  Memory:  Immediate;   Fair Recent;   Fair Remote;   Fair  Judgement:  Poor  Insight:  Lacking  Psychomotor Activity:  Normal  Concentration:  Concentration: Fair  Recall:  Fis[employer]Fair  Fund of Knowledge:  Fair  Language:  Fair  Akathisia:  No  Handed:    AIMS (if indicated):     Assets:  Manufacturing systems engineerCommunication Skills Physical Health Resilience Social Support  ADL's:  Intact  Cognition:  WNL  Sleep:         COGNITIVE FEATURES THAT CONTRIBUTE TO RISK:  None    SUICIDE RISK:   Moderate:  Frequent suicidal ideation with limited intensity, and duration, some specificity in terms of plans, no associated intent, good self-control, limited dysphoria/symptomatology, some risk factors present, and identifiable protective factors, including available and accessible social support.  PLAN OF CARE:   - Admit to inpatient level of care  - Schizoaffective disorder, bipolar type   - Restart seroquel 600mg  po qhs  - Start thorazine 50mg  po BID  - Alcohol use disorder/withdrawal  - Start CIWA  with ativan  - Anxiety  - Start atarax 25mg  po q6h prn anxiety  - Encourage participation in groups and the therapeutic milieu  -Discharge planning will be ongoing  I certify that inpatient services furnished can reasonably be expected to improve the patient's condition.   Micheal Likenshristopher T Conroy Goracke, MD 04/15/2017, 5:37 PM

## 2017-04-15 NOTE — Progress Notes (Signed)
Recreation Therapy Notes  Date: 04/15/17 Time: 1000 Location: 500 Hall Dayroom  Group Topic: Coping Skills  Goal Area(s) Addresses:  Patients will be able to identify positive coping skills. Patients will be able to identify types of positive coping skills. Patients will be able to identify benefits of using coping skills post d/c.  Intervention: Pencils, web worksheet  Activity: OrthoptistWeb Design.  Patients were to identify all the things they have been dealing with that have gotten them struck and write them inside the spider web.  Patients were to then identify 20 coping skills they could use to combat the things they had been dealing with.  During processing, patients were to identify 3 of the things they had been struggling with and 5 of their coping skills.  Education:Coping Skills, Discharge Planning.   Education Outcome: Acknowledges understanding/In group clarification offered/Needs additional education.   Clinical Observations/Feedback:  Pt did not attend group.    Caroll RancherMarjette Taray Normoyle, LRT/CTRS         [name], Bradleigh Sonnen A 04/15/2017 12:34 PM
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
## 2017-04-15 NOTE — [facility] Group Notes (Signed)
[facility] Group Notes:  (Nursing/MHT/Case Management/Adjunct)  Date:  04/15/2017  Time:  6:21 PM  Type of Therapy:  Psychoeducational Skills  Participation Level:  Active  Participation Quality:  Attentive  Affect:  Appropriate  Cognitive:  Appropriate  Insight:  Appropriate  Engagement in Group:  Engaged  Modes of Intervention:  Discussion  Summary of Progress/Problems: The was on coping when hearing voices.  Gennett Garcia O Mairyn Lenahan 04/15/2017, 6:21 PM 

## 2017-04-15 NOTE — [facility] Group Notes (Signed)
[facility] Group Notes:  (Nursing/MHT/Case Management/Adjunct)  Date:  04/15/2017  Time:  6:21 PM  Type of Therapy:  Psychoeducational Skills  Participation Level:  Active  Participation Quality:  Attentive  Affect:  Appropriate  Cognitive:  Appropriate  Insight:  Appropriate  Engagement in Group:  Engaged  Modes of Intervention:  Discussion  Summary of Progress/Problems: The was on coping when hearing voices.  Bethann PunchesJane O Johncharles Fusselman 04/15/2017, 6:21 PM

## 2017-04-15 NOTE — Progress Notes (Signed)
Patient ID: Megan Edwards, female   DOB: 08/06/1975, 42 y.o.   MRN: [id number] DAR Note: Pt observed in resting with eye closed. Pt endorsed command auditory hallucinations; "they keep asking to kill myself and someone else." Pt contracts for safety. Pt also endorsed moderate anxiety and depression.  Pt may be med seeking; became very angry and loud because she couldn't get 600 mg of Seroquel and 300 mg Trazodone-"this is crazy; all I'm getting is 300 of Seroquel? That is ridiculous." Medications offered as prescribed.  Support, encouragement, and safe environment provided.  15-minute safety checks continue. Pt was med compliant.

## 2017-04-15 NOTE — [facility] Group Notes (Signed)
LCSW Group Therapy Note   04/15/2017 1:15pm   Type of Therapy and Topic:  Group Therapy:  Positive Affirmations   Participation Level:  Did Not Attend  Description of Group: This group addressed positive affirmation toward self and others. Patients went around the room and identified two positive things about themselves and two positive things about a peer in the room. Patients reflected on how it felt to share something positive with others, to identify positive things about themselves, and to hear positive things from others. Patients were encouraged to have a daily reflection of positive characteristics or circumstances.  Therapeutic Goals 1. Patient will verbalize two of their positive qualities 2. Patient will demonstrate empathy for others by stating two positive qualities about a peer in the group 3. Patient will verbalize their feelings when voicing positive self affirmations and when voicing positive affirmations of others 4. Patients will discuss the potential positive impact on their wellness/recovery of focusing on positive traits of self and others. Summary of Patient Progress:    Therapeutic Modalities Cognitive Behavioral Therapy Motivational Interviewing  Connie Lasater B Ellias Mcelreath, LCSW 04/15/2017 1:40 PM  

## 2017-04-16 MED ORDER — NALTREXONE HCL 50 MG PO TABS
50.0000 mg | ORAL_TABLET | Freq: Every day | ORAL | Status: DC
  Administered 2017-04-16 – 2017-04-18 (×3): 50 mg via ORAL
  Filled 2017-04-16 (×5): qty 1

## 2017-04-16 MED ORDER — LABETALOL HCL 100 MG PO TABS
200.0000 mg | ORAL_TABLET | Freq: Two times a day (BID) | ORAL | Status: DC
  Administered 2017-04-16 – 2017-04-18 (×4): 200 mg via ORAL
  Filled 2017-04-16 (×2): qty 56
  Filled 2017-04-16 (×3): qty 1
  Filled 2017-04-16: qty 28
  Filled 2017-04-16: qty 1
  Filled 2017-04-16: qty 2
  Filled 2017-04-16: qty 28

## 2017-04-16 MED ORDER — HYDROXYZINE HCL 50 MG PO TABS
50.0000 mg | ORAL_TABLET | Freq: Four times a day (QID) | ORAL | Status: DC | PRN
  Administered 2017-04-17 (×2): 50 mg via ORAL
  Filled 2017-04-16 (×2): qty 1
  Filled 2017-04-16: qty 20

## 2017-04-16 MED ORDER — GABAPENTIN 300 MG PO CAPS
300.0000 mg | ORAL_CAPSULE | Freq: Three times a day (TID) | ORAL | Status: DC
  Administered 2017-04-16 – 2017-04-18 (×5): 300 mg via ORAL
  Filled 2017-04-16 (×4): qty 1
  Filled 2017-04-16 (×2): qty 42
  Filled 2017-04-16 (×2): qty 1
  Filled 2017-04-16: qty 42

## 2017-04-16 MED ORDER — GABAPENTIN 600 MG PO TABS
300.0000 mg | ORAL_TABLET | Freq: Three times a day (TID) | ORAL | Status: DC
  Filled 2017-04-16 (×2): qty 0.5

## 2017-04-16 NOTE — [facility] Group Notes (Signed)
[facility] Group Notes:  (Nursing/MHT/Case Management/Adjunct)  Date:  04/16/2017  Time:  3:04 PM  Type of Therapy:  Group Therapy  Participation Level:  Did Not Attend  Participation Quality:    Affect:    Cognitive:   Insight:    Engagement in Group:    Modes of Intervention:    Summary of Progress/Problems:  Patient did not attend, patient with SW.   Earline MayotteKnight, Rodney Yera Shephard 04/16/2017, 3:04 PM

## 2017-04-16 NOTE — Progress Notes (Signed)
The patient verbalized that she had an "adventurous day". She states that she felt more comfortable today with her peers. In terms of the theme of the day, her personal development will involve continuing to feel positive both in the hospital as well as outside of it.

## 2017-04-16 NOTE — Progress Notes (Addendum)
Recreation Therapy Notes  INPATIENT RECREATION THERAPY ASSESSMENT  Patient Details Name: Megan Edwards MRN: [id number] DOB: [birth year] Today's Date: 04/16/2017  Patient Stressors: Family  Pt stated she was here for hearing voices.  Coping Skills:   Isolate, Arguments, Substance Abuse, Avoidance, Self-Injury, Exercise, Talking, Music, Sports  Personal Challenges: Anger, Communication, Concentration, Decision-Making, Relationships, Social Interaction, Stress Management, Substance Abuse, Time Management, Trusting Others  Leisure Interests (2+):  Social - Family, Exercise - Walking, Social - Friends  LawyerAwareness of Community Resources:  Yes  Community Resources:  Gym, Park, Other (Comment)(science center, walking trails, tennis courts)  Current Use: Yes  Patient Strengths:  Determined; Easily adaptable  Patient Identified Areas of Improvement:  Self-esteem; Stigmas to diagnosis  Current Recreation Participation:  Everyday  Patient Goal for Hospitalization:  "Get meds better to perform to the best of my abilities".  [place]  County of Residence:  [place]  Current [place] (including self-harm):  No  Current HI:  No  Consent to Intern Participation: N/A   [name], LRT/CTRS  [name], Newell Wafer A 04/16/2017, 1:47 PM

## 2017-04-16 NOTE — [facility] Group Notes (Signed)
LCSW Group Therapy Note   04/16/2017 1:15pm   Type of Therapy and Topic:  Group Therapy:  Trust and Honesty  Participation Level:  Did Not Attend  Description of Group:    In this group patients will be asked to explore the value of being honest.  Patients will be guided to discuss their thoughts, feelings, and behaviors related to honesty and trusting in others. Patients will process together how trust and honesty relate to forming relationships with peers, family members, and self. Each patient will be challenged to identify and express feelings of being vulnerable. Patients will discuss reasons why people are dishonest and identify alternative outcomes if one was truthful (to self or others). This group will be process-oriented, with patients participating in exploration of their own experiences, giving and receiving support, and processing challenge from other group members.   Therapeutic Goals: 1. Patient will identify why honesty is important to relationships and how honesty overall affects relationships.  2. Patient will identify a situation where they lied or were lied too and the  feelings, thought process, and behaviors surrounding the situation 3. Patient will identify the meaning of being vulnerable, how that feels, and how that correlates to being honest with self and others. 4. Patient will identify situations where they could have told the truth, but instead lied and explain reasons of dishonesty.   Summary of Patient Progress    Therapeutic Modalities:   Cognitive Behavioral Therapy Solution Focused Therapy Motivational Interviewing Brief Therapy  Megan RogueRodney B [name], LCSW 04/16/2017 1:10 PM

## 2017-04-16 NOTE — Progress Notes (Signed)
D: When asked about her day the pt stated, "finally got the voices quieted down a little bit in my head". However, pt appeared to be slightly anxious and walked away while talking to the Clinical research associatewriter. Pt has no questions or concerns.    A:  Support and encouragement was offered. 15 min checks continued for safety.  R: Pt remains safe.

## 2017-04-16 NOTE — Progress Notes (Signed)
Recreation Therapy Notes  Date: 04/16/17 Time: 1000 Location: 500 Hall Dayroom  Group Topic: Communication  Goal Area(s) Addresses:  Patient will effectively communicate with peers in group.  Patient will verbalize benefit of healthy communication. Patient will verbalize positive effect of healthy communication on post d/c goals.  Patient will identify communication techniques that made activity effective for group.   Behavioral Response: Engaged  Intervention: Futures traderGeometrical pictures, pencils  Activity: Back to Back Drawings.  Patients were paired up in groups of two.  One person was the speaker and the other was the listener.  The person speaking was given a geometrical picture to describe to their partner.  The listener was to draw the picture as it was described to them but they could not ask any clarifying questions.  Education: Communication, Discharge Planning  Education Outcome: Acknowledges understanding/In group clarification offered/Needs additional education.   Clinical Observations/Feedback: Pt was active and engaged during group.  Pt was bright and able to focus on the task.  Pt stated her partner's instructions were "clear, her delivery was slow and she wasn't just throwing out instructions".  Pt also stated "body language can affect how you are viewed".  Pt was pleasant throughout activity.    Caroll RancherMarjette Audelia Knape, LRT/CTRS      Lillia AbedLindsay, [place] A 04/16/2017 11:59 AM

## 2017-04-16 NOTE — Plan of Care (Signed)
  Progressing Safety: Periods of time without injury will increase 04/16/2017 1053 - Progressing by Layla BarterWhite, Maria Coin L, RN Health Behavior/Discharge Planning: Compliance with therapeutic regimen will improve 04/16/2017 1053 - Progressing by Layla BarterWhite, Korrine Sicard L, RN Role Relationship: Ability to communicate needs accurately will improve 04/16/2017 1053 - Progressing by Layla BarterWhite, Mallissa Lorenzen L, RN

## 2017-04-16 NOTE — Progress Notes (Signed)
Pt presents with a flat affect and anxious mood. Pt reports decreased depression 2/10 and increased anxiety 10/10. Pt endorses SI with a plan to jump off a bridge. Pt verbally contracts for safety. Pt denies HI. Pt endorses AVH "seeing images of people that are not there and hearing voices telling her not to eat". Pt reports withdrawal symptoms of tremors and anxiety. Pt reports good sleep last night.  Medications reviewed with pt. Pt denies any side effects to meds. Medications administered as ordered per MD. Verbal support provided. Pt encouraged to attend groups. 15 minute checks performed for safety.

## 2017-04-16 NOTE — Tx Team (Signed)
Interdisciplinary Treatment and Diagnostic Plan Update  04/16/2017 Time of Session: 3:01 PM  Megan Edwards MRN: [id number]  Principal Diagnosis: Schizoaffective disorder [facility])  Secondary Diagnoses: Principal Problem:   Schizoaffective disorder (HCC)   Current Medications:  Current Facility-Administered Medications  Medication Dose Route Frequency Provider Last Rate Last Dose  . benztropine (COGENTIN) tablet 0.5 mg  0.5 mg Oral BID PRN Micheal Likens, MD   0.5 mg at 04/15/17 2223  . chlorproMAZINE (THORAZINE) tablet 50 mg  50 mg Oral Q4H PRN Micheal Likens, MD   50 mg at 04/15/17 1501   Or  . chlorproMAZINE (THORAZINE) injection 50 mg  50 mg Intramuscular Q4H PRN Micheal Likens, MD      . chlorproMAZINE (THORAZINE) tablet 50 mg  50 mg Oral BID Micheal Likens, MD   50 mg at 04/16/17 0759  . gabapentin (NEURONTIN) capsule 300 mg  300 mg Oral TID Micheal Likens, MD      . hydrOXYzine (ATARAX/VISTARIL) tablet 50 mg  50 mg Oral Q6H PRN Micheal Likens, MD      . labetalol (NORMODYNE) tablet 200 mg  200 mg Oral BID Jolyne Loa T, MD      . loperamide (IMODIUM) capsule 2-4 mg  2-4 mg Oral PRN Laveda Abbe, NP      . LORazepam (ATIVAN) tablet 1 mg  1 mg Oral Q6H PRN Laveda Abbe, NP      . LORazepam (ATIVAN) tablet 1 mg  1 mg Oral TID Laveda Abbe, NP   1 mg at 04/16/17 1137   Followed by  . [START ON 04/17/2017] LORazepam (ATIVAN) tablet 1 mg  1 mg Oral BID Laveda Abbe, NP       Followed by  . [START ON 04/18/2017] LORazepam (ATIVAN) tablet 1 mg  1 mg Oral Daily Laveda Abbe, NP      . multivitamin with minerals tablet 1 tablet  1 tablet Oral Daily Laveda Abbe, NP   1 tablet at 04/16/17 0801  . naltrexone (DEPADE) tablet 50 mg  50 mg Oral Daily Micheal Likens, MD      . ondansetron (ZOFRAN-ODT) disintegrating tablet 4 mg  4 mg Oral Q6H PRN Laveda Abbe, NP      . QUEtiapine (SEROQUEL) tablet 600 mg  600 mg Oral QHS Micheal Likens, MD   600 mg at 04/15/17 2222  . rivaroxaban (XARELTO) tablet 20 mg  20 mg Oral Daily Micheal Likens, MD   20 mg at 04/16/17 0801  . thiamine (VITAMIN B-1) tablet 100 mg  100 mg Oral Daily Laveda Abbe, NP   100 mg at 04/16/17 0801    PTA Medications: Medications Prior to Admission  Medication Sig Dispense Refill Last Dose  . atomoxetine (STRATTERA) 80 MG capsule Take 80 mg by mouth daily.  6 Past Month at Unknown time  . benztropine (COGENTIN) 0.5 MG tablet Take 1 tablet (0.5 mg total) by mouth daily. 30 tablet 0 04/14/2017 at Unknown time  . gabapentin (NEURONTIN) 300 MG capsule Take 300 mg by mouth at bedtime as needed (restless legs).   0 Past Week at Unknown time  . labetalol (NORMODYNE) 200 MG tablet Take 1 tablet (200 mg total) by mouth 2 (two) times daily. 60 tablet 0 04/13/2017 at 1400  . prazosin (MINIPRESS) 2 MG capsule Take 2 mg by mouth at bedtime.  5 Past Week at Unknown time  . promethazine (PHENERGAN) 25  MG tablet TAKE ONE TABLET BY MOUTH ONCE DAILY AS NEEDED FOR NAUSEA  2 04/13/2017 at Unknown time  . QUEtiapine (SEROQUEL) 300 MG tablet Take 2 tablets (600 mg total) by mouth at bedtime. 60 tablet 0 04/13/2017 at Unknown time  . QUEtiapine (SEROQUEL) 50 MG tablet Take 1 tablet (50 mg total) by mouth 2 (two) times daily. 60 tablet 0 04/13/2017 at Unknown time  . traZODone (DESYREL) 100 MG tablet Take 1 tablet (100 mg total) by mouth at bedtime as needed for sleep. 30 tablet 0 04/12/2017  . XARELTO 20 MG TABS tablet Take 20 mg by mouth daily.  0 04/13/2017 at 0800  . zolpidem (AMBIEN) 10 MG tablet Take 10 mg by mouth at bedtime as needed for sleep.   04/13/2017 at Unknown time    Patient Stressors: Marital or family conflict Medication change or noncompliance Substance abuse  Patient Strengths: Ability for insight Average or above average intelligence Capable of  independent living [employer] Motivation for treatment/growth  Treatment Modalities: Medication Management, Group therapy, Case management,  1 to 1 session with clinician, Psychoeducation, Recreational therapy.   Physician Treatment Plan for Primary Diagnosis: Schizoaffective disorder (HCC) Long Term Goal(s): Improvement in symptoms so as ready for discharge  Short Term Goals: Ability to identify changes in lifestyle to reduce recurrence of condition will improve Ability to verbalize feelings will improve Ability to disclose and discuss suicidal ideas Ability to identify and develop effective coping behaviors will improve Compliance with prescribed medications will improve Ability to identify triggers associated with substance abuse/mental health issues will improve  Medication Management: Evaluate patient's response, side effects, and tolerance of medication regimen.  Therapeutic Interventions: 1 to 1 sessions, Unit Group sessions and Medication administration.  Evaluation of Outcomes: Progressing  Physician Treatment Plan for Secondary Diagnosis: Principal Problem:   Schizoaffective disorder (HCC)   Long Term Goal(s): Improvement in symptoms so as ready for discharge  Short Term Goals: Ability to identify changes in lifestyle to reduce recurrence of condition will improve Ability to verbalize feelings will improve Ability to disclose and discuss suicidal ideas Ability to identify and develop effective coping behaviors will improve Compliance with prescribed medications will improve Ability to identify triggers associated with substance abuse/mental health issues will improve  Medication Management: Evaluate patient's response, side effects, and tolerance of medication regimen.  Therapeutic Interventions: 1 to 1 sessions, Unit Group sessions and Medication administration.  Evaluation of Outcomes: Progressing   RN Treatment Plan for Primary Diagnosis:  Schizoaffective disorder (HCC) Long Term Goal(s): Knowledge of disease and therapeutic regimen to maintain health will improve  Short Term Goals: Ability to remain free from injury will improve, Ability to participate in decision making will improve, Ability to disclose and discuss suicidal ideas, Ability to identify and develop effective coping behaviors will improve and Compliance with prescribed medications will improve  Medication Management: RN will administer medications as ordered by provider, will assess and evaluate patient's response and provide education to patient for prescribed medication. RN will report any adverse and/or side effects to prescribing provider.  Therapeutic Interventions: 1 on 1 counseling sessions, Psychoeducation, Medication administration, Evaluate responses to treatment, Monitor vital signs and CBGs as ordered, Perform/monitor CIWA, COWS, AIMS and Fall Risk screenings as ordered, Perform wound care treatments as ordered.  Evaluation of Outcomes: Progressing   LCSW Treatment Plan for Primary Diagnosis: Schizoaffective disorder (HCC) Long Term Goal(s): Safe transition to appropriate next level of care at discharge, Engage patient in therapeutic group addressing interpersonal  concerns.  Short Term Goals: Engage patient in aftercare planning with referrals and resources, Increase emotional regulation, Facilitate acceptance of mental health diagnosis and concerns, Identify triggers associated with mental health/substance abuse issues and Increase skills for wellness and recovery  Therapeutic Interventions: Assess for all discharge needs, 1 to 1 time with Social worker, Explore available resources and support systems, Assess for adequacy in community support network, Educate family and significant other(s) on suicide prevention, Complete Psychosocial Assessment, Interpersonal group therapy.  Evaluation of Outcomes: Progressing   Progress in Treatment: Attending  groups: Yes Participating in groups: Yes Taking medication as prescribed: Yes Toleration medication: Yes, no side effects reported at this time Family/Significant other contact made: No  Patient understands diagnosis: Yes AEB by asking for help with psychosis  Discussing patient identified problems/goals with staff: Yes Medical problems stabilized or resolved: Yes Denies suicidal/homicidal ideation: Yes Issues/concerns per patient self-inventory: None Other: N/A  New problem(s) identified: None identified at this time.   New Short Term/Long Term Goal(s): "I want relief from the voices.  I also don't want to go home from here because I don't treust anyoine there.  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  She does not wish to return home.      Reason for Continuation of Hospitalization: Delusions  Depression Hallucinations Medication stabilization Suicidal ideation   Estimated Length of Stay: 04/18/17  Attendees: Patient: Megan Edwards 04/16/2017  3:01 PM  Physician: Jolyne Loa, MD 04/16/2017  3:01 PM  Nursing: Roddie Mc, RN 04/16/2017  3:01 PM  RN Care Manager: Onnie Boer, RN 04/16/2017  3:01 PM  Social Worker: Richelle Ito 04/16/2017  3:01 PM  Recreational Therapist: Aggie Cosier 04/16/2017  3:01 PM  Other: Tomasita Morrow 04/16/2017  3:01 PM  Other:  04/16/2017  3:01 PM    Scribe for Treatment Team:  Daryel Gerald LCSW 04/16/2017 3:01 PM

## 2017-04-16 NOTE — Progress Notes (Signed)
[facility] MD Progress Note  04/16/2017 2:01 PM Megan Edwards  MRN:  [id number] Subjective:    Megan Edwards is a 42 y/o F with history of schizoaffective disorder bipolar type who was admitted voluntarily with worsening depression, AH, delusions, and SI. Pt has relevant history of DC from [facility] about 10 days ago, and she reports that she was adherent to her medications after discharge. She experienced increasing AH, and she began use alcohol in response. She had delusions that she was being poisoned in her food and shampoo by her roommate, and she had intrusive thoughts of self harm and SI to jump from a bridge, so she returned to [facility] for additional treatment. She agreed to be started on thorazine in addition to home medication of seroquel.  Upon evaluation today, pt shares, "I'm a little better today." She reports AH "are kind of weird today  - they are like 60%." Pt shares that when she first came to the hospital her AH were "100%" intensity. She endorses VH of seeing "shadows" but they have also diminished in intensity. She endorses SI without plan today, and she denies HI. She continues to feel anxiety throughout the day and she describes having cravings for "opiates and alcohol." Pt has met with SW team, and she expressed interest in receiving substance use treatment at [facility], and she was accepted for Friday morning.   Discussed with patient about treatment plan. She feels that she is improving and will likely be ready to go Friday morning to [facility]. Pt would like to continue on her current regimen of seroquel and thorazine. She would like additional help with treatment of anxiety, and she was in agreement to start trial of gabapentin as well as to increase dose of vistaril. She will also start trial of naltrexone to address symptoms of cravings. Pt was in agreement with the above plan, and she had no further questions, comments, or concerns.   Principal Problem: Schizoaffective disorder  ([place]) Diagnosis:   Patient Active Problem List   Diagnosis Date Noted  . Schizoaffective disorder ([place]) [F25.9] 03/30/2017  . Adjustment disorder with disturbance of emotion [F43.29] 03/29/2017  . Alcohol abuse with alcohol-induced mood disorder ([place]) [F10.14] 01/18/2017  . Anxiety and depression [F41.9, F32.9] 01/14/2017  . Pulmonary embolism ([place]) [I26.99] 12/19/2016  . Alcohol withdrawal ([place]) [F10.239] 12/01/2016  . ADD (attention deficit disorder) [F98.8] 05/10/2016  . Opioid use disorder, mild, in controlled environment ([place]) [F11.10] 05/06/2016  . Essential hypertension [I10] 05/06/2016  . Alcohol use disorder, moderate, in early remission, dependence ([place]) [F10.21] 05/06/2016  . Bipolar I disorder, most recent episode manic, severe with psychotic features ([place]) [F31.2] 01/20/2016  . Chronic pain syndrome [G89.4]    Total Time spent with patient: 30 minutes  Past Psychiatric History: see H&P  Past Medical History:  Past Medical History:  Diagnosis Date  . Abnormal Pap smear   . Alcohol abuse   . Anxiety and depression 01/14/2017  . Bipolar 1 disorder ([place])   . Depression   . Genital herpes 2000  . Hypertension   . Infection    UTI  . Ovarian cyst   . Schizoaffective disorder ([place]) 05/05/2016  . SVT (supraventricular tachycardia) ([facility])     Past Surgical History:  Procedure Laterality Date  . addenoidectomy    . BREAST ENHANCEMENT SURGERY    . BREAST SURGERY    . KNEE SURGERY    . KNEE SURGERY Left   . TONSILLECTOMY    . WISDOM TOOTH EXTRACTION  Family History:  Family History  Problem Relation Age of Onset  . Alcoholism Unknown   . Leukemia Unknown   . Cervical cancer Unknown   . Bone cancer Unknown   . Cirrhosis Unknown   . Diabetes Mellitus II Unknown   . Depression Unknown   . Hypertension Unknown   . Thyroid cancer Unknown   . Diabetes Mellitus II Mother   . Hypertension Mother   . Alcoholism Father   . Mental illness Paternal Grandmother     Family Psychiatric  History: see H&P Social History:  Social History   Substance and Sexual Activity  Alcohol Use Yes  . Alcohol/week: 0.0 oz   Comment: states has not drank alcohol since 12/19/16     Social History   Substance and Sexual Activity  Drug Use Yes  . Types: Methamphetamines   Comment: former-opiate use    Social History   Socioeconomic History  . Marital status: Divorced    Spouse name: None  . Number of children: None  . Years of education: None  . Highest education level: None  Social Needs  . Financial resource strain: None  . Food insecurity - worry: None  . Food insecurity - inability: None  . Transportation needs - medical: None  . Transportation needs - non-medical: None  Occupational History  . Occupation: Metallurgist: [name]  Tobacco Use  . Smoking status: Never Smoker  . Smokeless tobacco: Never Used  Substance and Sexual Activity  . Alcohol use: Yes    Alcohol/week: 0.0 oz    Comment: states has not drank alcohol since 12/19/16  . Drug use: Yes    Types: Methamphetamines    Comment: former-opiate use  . Sexual activity: Not Currently    Birth control/protection: Abstinence, None  Other Topics Concern  . None  Social History Narrative  . None   Additional Social History:    Pain Medications: see PTA meds Prescriptions: see PTA meds Over the Counter: see PTA meds History of alcohol / drug use?: Yes Name of Substance 1: alcohol  1 - Age of First Use: 21 1 - Amount (size/oz): varies (several shots of liquor and glasses of wine) 1 - Frequency: daily 1 - Duration: ongoing 1 - Last Use / Amount: this AM                  Sleep: Good  Appetite:  Good  Current Medications: Current Facility-Administered Medications  Medication Dose Route Frequency Provider Last Rate Last Dose  . benztropine (COGENTIN) tablet 0.5 mg  0.5 mg Oral BID PRN Pennelope Bracken, MD   0.5 mg at 04/15/17 2223  .  chlorproMAZINE (THORAZINE) tablet 50 mg  50 mg Oral Q4H PRN Pennelope Bracken, MD   50 mg at 04/15/17 1501   Or  . chlorproMAZINE (THORAZINE) injection 50 mg  50 mg Intramuscular Q4H PRN Pennelope Bracken, MD      . chlorproMAZINE (THORAZINE) tablet 50 mg  50 mg Oral BID Pennelope Bracken, MD   50 mg at 04/16/17 0759  . gabapentin (NEURONTIN) tablet 300 mg  300 mg Oral TID Pennelope Bracken, MD      . hydrOXYzine (ATARAX/VISTARIL) tablet 50 mg  50 mg Oral Q6H PRN Pennelope Bracken, MD      . labetalol (NORMODYNE) tablet 200 mg  200 mg Oral BID Maris Berger T, MD      . loperamide (IMODIUM) capsule 2-4 mg  2-4 mg  Oral PRN Ethelene Hal, NP      . LORazepam (ATIVAN) tablet 1 mg  1 mg Oral Q6H PRN Ethelene Hal, NP      . LORazepam (ATIVAN) tablet 1 mg  1 mg Oral TID Ethelene Hal, NP   1 mg at 04/16/17 1137   Followed by  . [START ON 04/17/2017] LORazepam (ATIVAN) tablet 1 mg  1 mg Oral BID Ethelene Hal, NP       Followed by  . [START ON 04/18/2017] LORazepam (ATIVAN) tablet 1 mg  1 mg Oral Daily Ethelene Hal, NP      . multivitamin with minerals tablet 1 tablet  1 tablet Oral Daily Ethelene Hal, NP   1 tablet at 04/16/17 0801  . naltrexone (DEPADE) tablet 50 mg  50 mg Oral Daily Pennelope Bracken, MD      . ondansetron (ZOFRAN-ODT) disintegrating tablet 4 mg  4 mg Oral Q6H PRN Ethelene Hal, NP      . QUEtiapine (SEROQUEL) tablet 600 mg  600 mg Oral QHS Pennelope Bracken, MD   600 mg at 04/15/17 2222  . rivaroxaban (XARELTO) tablet 20 mg  20 mg Oral Daily Pennelope Bracken, MD   20 mg at 04/16/17 0801  . thiamine (VITAMIN B-1) tablet 100 mg  100 mg Oral Daily Ethelene Hal, NP   100 mg at 04/16/17 9622    Lab Results: No results found for this or any previous visit (from the past 48 hour(s)).  Blood Alcohol level:  Lab Results  Component Value Date   ETH 70 (H)  04/14/2017   ETH <10 29/79/8921    Metabolic Disorder Labs: Lab Results  Component Value Date   HGBA1C 5.4 03/31/2017   MPG 108.28 03/31/2017   MPG 108 05/07/2016   Lab Results  Component Value Date   PROLACTIN 86.9 (H) 05/07/2016   PROLACTIN 19.4 05/06/2016   Lab Results  Component Value Date   CHOL 201 (H) 03/31/2017   TRIG 122 03/31/2017   HDL 42 03/31/2017   CHOLHDL 4.8 03/31/2017   VLDL 24 03/31/2017   LDLCALC 135 (H) 03/31/2017   LDLCALC 96 05/06/2016    Physical Findings: AIMS: Facial and Oral Movements Muscles of Facial Expression: None, normal Lips and Perioral Area: None, normal Jaw: None, normal Tongue: None, normal,Extremity Movements Upper (arms, wrists, hands, fingers): None, normal Lower (legs, knees, ankles, toes): None, normal, Trunk Movements Neck, shoulders, hips: None, normal, Overall Severity Severity of abnormal movements (highest score from questions above): None, normal Incapacitation due to abnormal movements: None, normal Patient's awareness of abnormal movements (rate only patient's report): No Awareness, Dental Status Current problems with teeth and/or dentures?: No Does patient usually wear dentures?: No  CIWA:  CIWA-Ar Total: 2 COWS:     Musculoskeletal: Strength & Muscle Tone: within normal limits Gait & Station: normal Patient leans: N/A  Psychiatric Specialty Exam: Physical Exam  Nursing note and vitals reviewed.   Review of Systems  Constitutional: Negative for chills and fever.  Respiratory: Negative for cough and shortness of breath.   Cardiovascular: Negative for chest pain.  Gastrointestinal: Negative for heartburn and nausea.  Psychiatric/Behavioral: Positive for depression, hallucinations and suicidal ideas. The patient is nervous/anxious.     Blood pressure (!) 137/111, pulse (!) 117, temperature (!) 97.5 F (36.4 C), resp. rate 16, height '5\' 7"'$  (1.702 m), weight 99.8 kg (220 lb), not currently breastfeeding.Body  mass index is 34.46 kg/m.  General  Appearance: Casual and Fairly Groomed  Eye Contact:  Good  Speech:  Clear and Coherent and Normal Rate  Volume:  Normal  Mood:  Anxious and Depressed  Affect:  Appropriate, Congruent and Constricted  Thought Process:  Coherent and Goal Directed  Orientation:  Full (Time, Place, and Person)  Thought Content:  Hallucinations: Auditory Visual  Suicidal Thoughts:  Yes.  without intent/plan  Homicidal Thoughts:  No  Memory:  Immediate;   Good Recent;   Good Remote;   Good  Judgement:  Fair  Insight:  Fair  Psychomotor Activity:  Normal  Concentration:  Concentration: Fair  Recall:  [employer] of Knowledge:  Fair  Language:  Fair  Akathisia:  No  Handed:    AIMS (if indicated):     Assets:  Armed forces logistics/support/administrative officer Physical Health Resilience Social Support  ADL's:  Intact  Cognition:  WNL  Sleep:  Number of Hours: 6     Treatment Plan Summary: Daily contact with patient to assess and evaluate symptoms and progress in treatment and Medication management. Pt reports improvement overall since admission, but she continues to report SI without plan, AH, VH, anxiety and cravings. She agrees to increase dose of vistaril. She agrees to start trials of gabapentin for anxiety and naltrexone for cravings. She is in agreement for transfer to [facility] on Friday AM.  - Continue to inpatient level of care  - Schizoaffective disorder, bipolar type              - Continue seroquel '600mg'$  po qhs             - Continue thorazine '50mg'$  po BID  - Alcohol use disorder/withdrawal             - Continue CIWA with ativan    - Start naltrexone '50mg'$  po qDay for cravings  - Anxiety             - Change atarax '25mg'$  po q6h prn anxiety to atarax '50mg'$  po q6h prn anxiety    - Start gabapentin '300mg'$  po TID  -EPS     - Continue cogentin 0.'5mg'$  BID prn EPS/tremors  - Continue other medications for medical conditions such as labetalol '200mg'$  BID and xarelto '20mg'$  qDay  -  Encourage participation in groups and the therapeutic milieu  -Discharge planning will be ongoing   Pennelope Bracken, MD 04/16/2017, 2:01 PM

## 2017-04-16 NOTE — [facility] Group Notes (Signed)
[facility] Group Notes:  (Nursing/MHT/Case Management/Adjunct)  Date:  04/16/2017  Time:  5:24 PM  Type of Therapy:  Psychoeducational Skills  Participation Level:  Did Not Attend  Participation Quality:    Affect:    Cognitive:    Insight:    Engagement in Group:    Modes of Intervention:    Summary of Progress/Problems:   Did not attend  Earline MayotteKnight, Brendy Ficek Shephard 04/16/2017, 5:24 PM

## 2017-04-16 NOTE — Progress Notes (Signed)
Patient ID: Myrla HalstedKelly L Encina, female   DOB: 1976/03/18, 42 y.o.   MRN: [id number]   D: Patient pleasant on approach tonight. Reports still having feelings of depression with some SI thoughts on and off. Contracts on unit. Interacting well with other peers. No overt psychosis tonight. Attending wrap up group. A: Staff will monitor on q 15 minute checks, follow treatment plan, and give medications as ordered. R: Cooperative on the unit. Taking medication without issue

## 2017-04-17 MED ORDER — GABAPENTIN 300 MG PO CAPS: 300.0000 mg | ORAL_CAPSULE | Freq: Three times a day (TID) | ORAL | 0 refills | Status: DC

## 2017-04-17 MED ORDER — LABETALOL HCL 200 MG PO TABS: 200.0000 mg | ORAL_TABLET | Freq: Two times a day (BID) | ORAL | 0 refills | Status: DC

## 2017-04-17 MED ORDER — CHLORPROMAZINE HCL 50 MG PO TABS
50.0000 mg | ORAL_TABLET | Freq: Three times a day (TID) | ORAL | Status: DC
  Administered 2017-04-17 – 2017-04-18 (×3): 50 mg via ORAL
  Filled 2017-04-17 (×6): qty 1
  Filled 2017-04-17: qty 2

## 2017-04-17 MED ORDER — HYDROXYZINE HCL 25 MG PO TABS
25.0000 mg | ORAL_TABLET | Freq: Four times a day (QID) | ORAL | Status: DC | PRN
  Filled 2017-04-17: qty 20

## 2017-04-17 MED ORDER — QUETIAPINE FUMARATE 300 MG PO TABS: 600.0000 mg | ORAL_TABLET | Freq: Every day | ORAL | 0 refills | Status: DC

## 2017-04-17 MED ORDER — RIVAROXABAN 20 MG PO TABS: 20.0000 mg | ORAL_TABLET | Freq: Every day | ORAL | 0 refills | Status: DC

## 2017-04-17 MED ORDER — NALTREXONE HCL 50 MG PO TABS: 50.0000 mg | ORAL_TABLET | Freq: Every day | ORAL | 0 refills | Status: DC

## 2017-04-17 MED ORDER — BENZTROPINE MESYLATE 0.5 MG PO TABS: 0.5000 mg | ORAL_TABLET | Freq: Two times a day (BID) | ORAL | 0 refills | Status: DC | PRN

## 2017-04-17 MED ORDER — HYDROXYZINE HCL 25 MG PO TABS: ORAL_TABLET | ORAL | 0 refills | Status: DC

## 2017-04-17 MED ORDER — CHLORPROMAZINE HCL 50 MG PO TABS: 50.0000 mg | ORAL_TABLET | Freq: Three times a day (TID) | ORAL | 0 refills | Status: DC

## 2017-04-17 NOTE — Progress Notes (Signed)
Recreation Therapy Notes  Date: 04/17/17 Time: 1000 Location: 500 Hall Dayroom   Group Topic: Communication, Team Building, Problem Solving  Goal Area(s) Addresses:  Patient will effectively work with peer towards shared goal.  Patient will identify skill used to make activity successful.  Patient will identify how skills used during activity can be used to reach post d/c goals.   Intervention: STEM Activity   Activity: [place] [place]. Patients were provided the following materials: 5 drinking straws, 5 rubber bands, 5 paper clips, 2 index cards, 2 drinking cups, and 2 toilet paper rolls. Using the provided materials patients were asked to build a launching mechanisms to launch a ping pong ball approximately 12 feet. Patients were divided into teams of 3-5.   Education:Social Skills, Discharge Planning.   Education Outcome: Acknowledges education/In group clarification offered/Needs additional education.   Clinical Observations/Feedback: Pt did not attend group.    Caroll RancherMarjette Caridad Silveira, LRT/CTRS          [name], Kalana Yust A 04/17/2017 12:40 PM

## 2017-04-17 NOTE — Progress Notes (Signed)
Goals group  Attended and participated  Patient set a goal to work on focusing and concentrating more.

## 2017-04-17 NOTE — Progress Notes (Signed)
  [facility]  Will you be returning to the same living situation after discharge:  No. At discharge, do you have transportation home?: Yes,  cab Do you have the ability to pay for your medications: Yes,  MCD  Release of information consent forms completed and in the chart;  Patient's signature needed at discharge.  Patient to Follow up at: Follow-up Information    [facility] Follow up on 04/18/2017.   Why:  You have a screening for possible admission on Friday 1/25 at 8:00AM. Please bring: photo [place]/proof of [place], medicaid card, and medication/prescription supply provided by the hospital. Thank you.  Contact information: [address] [place] [address] [phone]        [facility].   Specialty:  Family Medicine Why:  Follow up with them for medication management [facility] Contact information: [address] [place] [address] [phone]        [facility] Follow up.   Why:  You signed up for Transitional Care Team.  contact Thalia PartyJashella Sessoms when you get out fo [facility] for help with setting up an outpatient appointment at your choice of mental health providers, getting transportation and any other mental health needs Contact information: [address] [place] [address] [phone]           Next level of care provider [facility] Link:no  Safety Planning and Suicide Prevention discussed: Yes,  yews  Have you used any form of tobacco in the last 30 days? (Cigarettes, Smokeless Tobacco, Cigars, and/or Pipes): No  Has patient been referred to the Quitline?: N/A patient is not a smoker  Patient has been referred for addiction treatment: Yes  Ida RogueRodney B Zaevion Parke, LCSW 04/17/2017, 2:09 PM

## 2017-04-17 NOTE — [facility] Group Notes (Signed)
LCSW Group Therapy 04/17/2017 1:15pm  Type of Therapy and Topic:  Group Therapy:  Change and Accountability  Participation Level:  Active  Description of Group In this group, patients discussed power and accountability for change.  The group identified the challenges related to accountability and the difficulty of accepting the outcomes of negative behaviors.  Patients were encouraged to openly discuss a challenge/change they could take responsibility for.  Patients discussed the use of "change talk" and positive thinking as ways to support achievement of personal goals.  The group discussed ways to give support and empowerment to peers.  Therapeutic Goals: 1. Patients will state the relationship between personal power and accountability in the change process 2. Patients will identify the positive and negative consequences of a personal choice they have made 3. Patients will identify one challenge/choice they will take responsibility for making 4. Patients will discuss the role of "change talk" and the impact of positive thinking as it supports successful personal change 5. Patients will verbalize support and affirmation of change efforts in peers  Summary of Patient Progress:  Megan Edwards came to group and was there the entire time.  She listened quietly to the story.  She shared that she has previous substance use issues and that her family does not understand her diagnosis.  She asked the speaker how she can get her family on board with her treatment and have them believe her when she states that she is not on drugs.  She was advised to invite her family to her therapy appointments.   Therapeutic Modalities Solution Focused Brief Therapy Motivational Interviewing Cognitive Behavioral Therapy  Megan Heraldngel M Janny Edwards, Student-Social Work 04/17/2017 1:33 PM
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
## 2017-04-17 NOTE — Progress Notes (Signed)
dult Psychoeducational Group Note  Date:  04/17/2017 Time:  1620  Group Topic/Focus:  Leisure and lifestyle changes  Participation Level:  Active  Participation Quality:  Appropriate  Affect:  Appropriate  Cognitive:  Appropriate  Insight: Appropriate  Engagement in Group:  Engaged  Modes of Intervention:  Discussion and Education  Additional Comments: Activity: What would you like your life to be like one month from now?   

## 2017-04-17 NOTE — [facility] Suicide Risk Assessment (Signed)
[facility]   Principal Problem: Schizoaffective disorder [facility](HCC) Discharge Diagnoses:  Patient Active Problem List   Diagnosis Date Noted  . Schizoaffective disorder (HCC) [F25.9] 03/30/2017  . Adjustment disorder with disturbance of emotion [F43.29] 03/29/2017  . Alcohol abuse with alcohol-induced mood disorder (HCC) [F10.14] 01/18/2017  . Anxiety and depression [F41.9, F32.9] 01/14/2017  . Pulmonary embolism (HCC) [I26.99] 12/19/2016  . Alcohol withdrawal (HCC) [F10.239] 12/01/2016  . ADD (attention deficit disorder) [F98.8] 05/10/2016  . Opioid use disorder, mild, in controlled environment (HCC) [F11.10] 05/06/2016  . Essential hypertension [I10] 05/06/2016  . Alcohol use disorder, moderate, in early remission, dependence (HCC) [F10.21] 05/06/2016  . Bipolar I disorder, most recent episode manic, severe with psychotic features (HCC) [F31.2] 01/20/2016  . Chronic pain syndrome [G89.4]     Total Time spent with patient: 30 minutes  Musculoskeletal: Strength & Muscle Tone: within normal limits Gait & Station: normal Patient leans: N/A  Psychiatric Specialty Exam: Review of Systems  Constitutional: Negative for chills and fever.  Respiratory: Negative for cough and shortness of breath.   Cardiovascular: Negative for chest pain.  Gastrointestinal: Negative for heartburn and nausea.  Psychiatric/Behavioral: Positive for hallucinations. Negative for depression and suicidal ideas. The patient is nervous/anxious and has insomnia.     Blood pressure 103/62, pulse (!) 104, temperature 97.9 F (36.6 C), temperature source Oral, resp. rate 18, height 5\' 7"  (1.702 m), weight 99.8 kg (220 lb), not currently breastfeeding.Body mass index is 34.46 kg/m.  General Appearance: Casual and Fairly Groomed  Patent attorneyye Contact::  Good  Speech:  Clear and Coherent and Normal Rate  Volume:  Normal  Mood:  Euthymic  Affect:  Appropriate and Congruent  Thought Process:  Coherent  and Goal Directed  Orientation:  Full (Time, Place, and Person)  Thought Content:  Hallucinations: Auditory and Ideas of Reference:   Paranoia  Suicidal Thoughts:  No  Homicidal Thoughts:  No  Memory:  Immediate;   Fair Recent;   Fair Remote;   Fair  Judgement:  Fair  Insight:  Fair  Psychomotor Activity:  Normal  Concentration:  Good  Recall:  Good  Fund of Knowledge:Good  Language: Good  Akathisia:  No  Handed:    AIMS (if indicated):     Assets:  Communication Skills Leisure Time Physical Health Resilience Social Support  Sleep:  Number of Hours: 5  Cognition: WNL  ADL's:  Intact   Mental Status Per Nursing Assessment::   On Admission:     Demographic Factors:  Caucasian and Low socioeconomic status  Loss Factors: Financial problems/change in socioeconomic status  Historical Factors: Impulsivity  Risk Reduction Factors:   Positive social support, Positive therapeutic relationship and Positive coping skills or problem solving skills  Continued Clinical Symptoms:  Anorexia Nervosa Alcohol/Substance Abuse/Dependencies Obsessive-Compulsive Disorder More than one psychiatric diagnosis Currently Psychotic Unstable or Poor Therapeutic Relationship  Cognitive Features That Contribute To Risk:  None    Suicide Risk:  Minimal: No identifiable suicidal ideation.  Patients presenting with no risk factors but with morbid ruminations; may be classified as minimal risk based on the severity of the depressive symptoms  Follow-up Information    Services, [facility] Follow up on 04/18/2017.   Why:  You have a screening for possible admission on Friday 1/25 at 8:00AM. Please bring: photo ID/proof of [place] residency, medicaid card, and medication/prescription supply provided by the hospital. Thank you.  Contact information: [address] [place] [address] [phone]  [facility] Follow up.   Specialty:  Family  Medicine Why:  Follow up with them for medication management when you return home from [facility] information: [address] [place] [address] [phone]        [facility] Follow up.   Why:  You signed up for Transitional Care Team.  contact Thalia Party when you get out fo [facility] for help with setting up an outpatient appointment at your choice of mental health providers, getting transportation and any other mental health needs Contact information: [address] [place] [address] [phone]         Subjective Data:  Megan Edwards is a 42 y/o F with history of schizoaffective disorder bipolar type who was admitted voluntarily with worsening depression, AH, delusions, and SI. Pt has relevant history of DC from [facility] about 10 days ago, and she reports that she was adherent to her medications after discharge. She experienced increasing AH, and she began use alcohol in response. She had delusions that she was being poisoned in her food and shampoo by her roommate, and she had intrusive thoughts of self harm and SI to jump from a bridge, so she returned to [facility] for additional treatment. She agreed to be started on thorazine in addition to home medication of seroquel.  Upon evaluation today, pt shares, "I didn't sleep at all last night; I woke up like clockwork, but I feel pretty good today." Pt shares that she continues to have AH which tell her statements such as "Don't eat the food - they put stuff in the food," however, pt has been able to eat regularly and participate in groups. She denies SI/HI/VH. She reports that her medications are helpful, but she feels that having additional thorazine during the daytime would be helpful for her. Pt continues to express desire to discharge to [facility] in the morning. She expresses some concern about her attention, and her ability to participate in the programming at [facility] due to her poor attention, and she  requests to be started on Vyvanse or a similar medication. Discussed with patient that her attention should improve with better management of her anxiety and hallucinations, as well as improved sleep, so we will first focus on those symptoms and she can work on any ongoing difficulties with attention with her outpatient provider, and she verbalized good understanding. She was able to engage in safety planning including plan to return to [facility] or contact emergency services if she feels unable to maintain her own safety or the safety of others. Pt had no further questions, comments, or concerns.   Plan Of Care/Follow-up recommendations:    - Discharge to outpatient level of care int he AM (pt will discharge directly to [facility])  - Schizoaffective disorder, bipolar type - Continue seroquel 600mg  po qhs - Change thorazine 50mg  po BID to thorazine 50mg  TID  - Alcohol use disorder/withdrawal             - Continue naltrexone 50mg  po qDay for cravings  - Anxiety - Continue atarax 50mg  po q6h prn anxiety             - Continue gabapentin 300mg  po TID  -EPS             - Continue cogentin 0.5mg  BID prn EPS/tremors  - Continue other medications for medical conditions such as labetalol 200mg  BID and xarelto 20mg  qDay  Activity:  as tolerated Diet:  normal Tests:  NA Other:  See above for DC plan  Micheal Likens, MD 04/17/2017, 3:08 PM

## 2017-04-17 NOTE — Progress Notes (Signed)
Patient walked to the nursing station and complaint of agitation and anxiety.. She rated her anxiety level at 7  on the a scale of 1-10 with 10 the worst. her pulse was 104. PRN Ativan 1 mg given. Patient received medication without difficulty.

## 2017-04-17 NOTE — B[facility] Suicide Risk Assessment (Signed)
B[facility] INPATIENT:  Family/Significant Other Suicide Prevention Education  Suicide Prevention Education:  Education Completed; Megan GlassmanDavid Edwards (pt's ex fiance) [phone] has been identified by the patient as the family member/significant other with whom the patient will be residing, and identified as the person(s) who will aid the patient in the event of a mental health crisis (suicidal ideations/suicide attempt).  With written consent from the patient, the family member/significant other has been provided the following suicide prevention education, prior to the and/or following the discharge of the patient.  The suicide prevention education provided includes the following:  Suicide risk factors  Suicide prevention and interventions  National Suicide Hotline telephone number  [facility] assessment telephone number  [facility] Emergency Assistance 911  [facility] and/or Residential Mobile Crisis Unit telephone number  Request made of family/significant other to:  Remove weapons (e.g., guns, rifles, knives), all items previously/currently identified as safety concern.    Remove drugs/medications (over-the-counter, prescriptions, illicit drugs), all items previously/currently identified as a safety concern.  The family member/significant other verbalizes understanding of the suicide prevention education information provided.  The family member/significant other agrees to remove the items of safety concern listed above.  SPE and aftercare plan reviewed with Megan Huaavid. He reports no concerns regarding pt's safety at discharge and is relieved to hear that patient [facility]. He is unable to provide transportation due to having to care for their two young children and asked that CSW provide pt with taxi voucher. He reports that pt does not have access to weapons/firearms. He reports that pt has been sounding much better on the phone and seems  to be motivated to pursue treatment.   Megan Gilham N Smart LCSW 04/17/2017, 10:00 AM

## 2017-04-17 NOTE — Progress Notes (Signed)
Pt presents with an animated affect and anxious mood. Pt reports feeling paranoid like the phones and t.v's are tapped. Pt report throwing up her food because she thinks someone put something in her food. Pt stated that she prefer to eat package foods. Pt denies SI. Pt denies AVH. Pt c/o withdrawal symptoms of anxiety.  Medications reviewed with pt. Medications administered as ordered per MD. Verbal support provided. Pt encouraged to attend groups. 15 minute checks performed for safety.

## 2017-04-18 MED ORDER — IBUPROFEN 600 MG PO TABS
600.0000 mg | ORAL_TABLET | Freq: Four times a day (QID) | ORAL | Status: DC | PRN
  Administered 2017-04-18: 600 mg via ORAL
  Filled 2017-04-18: qty 1

## 2017-04-18 MED ORDER — TRAZODONE HCL 50 MG PO TABS
50.0000 mg | ORAL_TABLET | Freq: Once | ORAL | Status: AC
Start: 2017-04-18 — End: 2017-04-18
  Administered 2017-04-18: 50 mg via ORAL
  Filled 2017-04-18 (×2): qty 1

## 2017-04-18 NOTE — Progress Notes (Signed)
Pt attend wrap up group. Her day was a 9. Her goal was to stay focus. She enjoyed her visit today.

## 2017-04-18 NOTE — Progress Notes (Signed)
Patient ID: Myrla HalstedKelly L Wessinger, female   DOB: 08/30/1975, 42 y.o.   MRN: [id number] D: Pt at the time of assessment was observed in dayroom not interacting. Pt denied depression, SI, HI or AVH. Pt however, endorsed mild anxiety and L. leg pain; states, "I will be leaving tomorrow; I feel better but a little anxious." Pt remained bright and cooperative. A: Medications offered as prescribed.  Support, encouragement, and safe environment provided.  15-minute safety checks continue. R: Pt was med compliant.  Pt attended wrap-up group. Safety checks continue.

## 2017-04-18 NOTE — Progress Notes (Signed)
Patient ID: Megan Edwards, female   DOB: [birth year], 42 y.o.   MRN: [id number]  Discharge Note.  Pt at the time of discharge was alert and oriented x4. Pt agreeable to discharge. AVS, suicide risk assessment package, discharge summary and prescriptions provided and reviewed. Pt verbalizes understanding of discharge instructions. Pt offers no questions or concerns. Pt. Denies SI, HI, pain or discomfort. Pt. Belongings returned and signed for. Pt ambulated off the unit. All Pt's belonging signed for and returned to Pt. [facility] via [employer] [facility] services.

## 2017-04-24 ENCOUNTER — Emergency Department (HOSPITAL_BASED_OUTPATIENT_CLINIC_OR_DEPARTMENT_OTHER)
Admission: EM | Admit: 2017-04-24 | Discharge: 2017-04-24 | Disposition: A | Discharge status: Home / Self Care | Payer: Medicaid Other | Attending: Physician Assistant | Admitting: Physician Assistant

## 2017-04-24 ENCOUNTER — Other Ambulatory Visit: Payer: Self-pay

## 2017-04-24 ENCOUNTER — Emergency Department (HOSPITAL_BASED_OUTPATIENT_CLINIC_OR_DEPARTMENT_OTHER): Payer: Medicaid Other

## 2017-04-24 ENCOUNTER — Encounter (HOSPITAL_BASED_OUTPATIENT_CLINIC_OR_DEPARTMENT_OTHER): Payer: Self-pay

## 2017-04-24 DIAGNOSIS — I10 Essential (primary) hypertension: Secondary | ICD-10-CM | POA: Diagnosis not present

## 2017-04-24 DIAGNOSIS — J111 Influenza due to unidentified influenza virus with other respiratory manifestations: Secondary | ICD-10-CM | POA: Insufficient documentation

## 2017-04-24 DIAGNOSIS — R112 Nausea with vomiting, unspecified: Secondary | ICD-10-CM | POA: Diagnosis not present

## 2017-04-24 DIAGNOSIS — Z7901 Long term (current) use of anticoagulants: Secondary | ICD-10-CM | POA: Diagnosis not present

## 2017-04-24 DIAGNOSIS — R509 Fever, unspecified: Secondary | ICD-10-CM | POA: Diagnosis present

## 2017-04-24 DIAGNOSIS — Z79899 Other long term (current) drug therapy: Secondary | ICD-10-CM | POA: Insufficient documentation

## 2017-04-24 LAB — COMPREHENSIVE METABOLIC PANEL
ALBUMIN: 3.9 g/dL (ref 3.5–5.0)
ALT: 92 U/L — ABNORMAL HIGH (ref 14–54)
AST: 77 U/L — AB (ref 15–41)
Alkaline Phosphatase: 129 U/L — ABNORMAL HIGH (ref 38–126)
Anion gap: 11 (ref 5–15)
BUN: 12 mg/dL (ref 6–20)
CHLORIDE: 95 mmol/L — AB (ref 101–111)
CO2: 24 mmol/L (ref 22–32)
Calcium: 8.8 mg/dL — ABNORMAL LOW (ref 8.9–10.3)
Creatinine, Ser: 0.9 mg/dL (ref 0.44–1.00)
GFR calc Af Amer: >60 mL/min (ref >60)
GFR calc non Af Amer: >60 mL/min (ref >60)
GLUCOSE: 142 mg/dL — AB (ref 65–99)
Potassium: 3.3 mmol/L — ABNORMAL LOW (ref 3.5–5.1)
SODIUM: 130 mmol/L — AB (ref 135–145)
Total Bilirubin: 0.7 mg/dL (ref 0.3–1.2)
Total Protein: 7.4 g/dL (ref 6.5–8.1)

## 2017-04-24 LAB — CBC WITH DIFFERENTIAL/PLATELET
BASOS PCT: 0 %
Basophils Absolute: 0 10*3/uL (ref 0.0–0.1)
EOS PCT: 1 %
Eosinophils Absolute: 0.1 10*3/uL (ref 0.0–0.7)
HCT: 39.8 % (ref 36.0–46.0)
Hemoglobin: 13.5 g/dL (ref 12.0–15.0)
Lymphocytes Relative: 3 %
Lymphs Abs: 0.4 10*3/uL — ABNORMAL LOW (ref 0.7–4.0)
MCH: 28.2 pg (ref 26.0–34.0)
MCHC: 33.9 g/dL (ref 30.0–36.0)
MCV: 83.1 fL (ref 78.0–100.0)
MONO ABS: 0.8 10*3/uL (ref 0.1–1.0)
Monocytes Relative: 7 %
Neutro Abs: 11.2 10*3/uL — ABNORMAL HIGH (ref 1.7–7.7)
Neutrophils Relative %: 89 %
PLATELETS: 210 10*3/uL (ref 150–400)
RBC: 4.79 MIL/uL (ref 3.87–5.11)
RDW: 14.1 % (ref 11.5–15.5)
WBC: 12.5 10*3/uL — ABNORMAL HIGH (ref 4.0–10.5)

## 2017-04-24 LAB — URINALYSIS, ROUTINE W REFLEX MICROSCOPIC
Bilirubin Urine: NEGATIVE
GLUCOSE, UA: NEGATIVE mg/dL
Ketones, ur: NEGATIVE mg/dL
Leukocytes, UA: NEGATIVE
Nitrite: NEGATIVE
PH: 6 (ref 5.0–8.0)
PROTEIN: NEGATIVE mg/dL
Specific Gravity, Urine: 1.025 (ref 1.005–1.030)

## 2017-04-24 LAB — URINALYSIS, MICROSCOPIC (REFLEX)

## 2017-04-24 LAB — PREGNANCY, URINE: Preg Test, Ur: NEGATIVE

## 2017-04-24 MED ORDER — ONDANSETRON 4 MG PO TBDP
4.0000 mg | ORAL_TABLET | Freq: Once | ORAL | Status: AC
  Administered 2017-04-24: 4 mg via ORAL
  Filled 2017-04-24: qty 1

## 2017-04-24 MED ORDER — ACETAMINOPHEN 325 MG PO TABS
650.0000 mg | ORAL_TABLET | Freq: Once | ORAL | Status: AC
  Administered 2017-04-24: 650 mg via ORAL
  Filled 2017-04-24: qty 2

## 2017-04-24 MED ORDER — SODIUM CHLORIDE 0.9 % IV BOLUS (SEPSIS)
1000.0000 mL | Freq: Once | INTRAVENOUS | Status: AC
  Administered 2017-04-24: 1000 mL via INTRAVENOUS

## 2017-04-24 MED ORDER — PROCHLORPERAZINE EDISYLATE 5 MG/ML IJ SOLN
5.0000 mg | Freq: Once | INTRAMUSCULAR | Status: AC
  Administered 2017-04-24: 5 mg via INTRAVENOUS
  Filled 2017-04-24: qty 2

## 2017-04-24 MED ORDER — DIPHENHYDRAMINE HCL 50 MG/ML IJ SOLN
25.0000 mg | Freq: Once | INTRAMUSCULAR | Status: AC
  Administered 2017-04-24: 25 mg via INTRAVENOUS
  Filled 2017-04-24: qty 1

## 2017-04-24 MED ORDER — ONDANSETRON 4 MG PO TBDP: 4.0000 mg | ORAL_TABLET | Freq: Three times a day (TID) | ORAL | 0 refills | Status: DC | PRN

## 2017-04-24 MED ORDER — IBUPROFEN 800 MG PO TABS
800.0000 mg | ORAL_TABLET | Freq: Once | ORAL | Status: AC
  Administered 2017-04-24: 800 mg via ORAL
  Filled 2017-04-24: qty 1

## 2017-04-24 NOTE — ED Triage Notes (Addendum)
C/o n/v/d, chills, HA x 4 days-last dose tylenol this am per pt-NAD-steady gait-at [facility] x 6 days for ETOH/drug use

## 2017-04-24 NOTE — Discharge Instructions (Signed)
Please make that you are taking ibuprofen and Tylenol back and forth around the clock so you are able to keep your fever down.  Please use the nausea medication as needed.  Return with any concerns.  However we think you likely have a flu.  Be sure to use lots of handwashing so as not to spread it.

## 2017-04-24 NOTE — ED Provider Notes (Addendum)
[facility] EMERGENCY DEPARTMENT Provider Note   CSN: [id number] Arrival date & time: 04/24/17  1617     History   Chief Complaint Chief Complaint  Patient presents with  . Vomiting    HPI Myrla HalstedKelly L Gallaway is a 42 y.o. female.  HPI   Patient is a 42 year old female presenting with anxiety depression both bipolar disorder presenting today with flulike symptoms.  [facility] with her [facility].  She reports that everyone there has been 6.  3 days ago she started with nausea vomiting.  And high fevers.  This is the typical flulike symptoms this years.  No abdominal pain. ALready went through alcohol withdrawal 3 weeks ago.   Past Medical History:  Diagnosis Date  . Abnormal Pap smear   . Alcohol abuse   . Anxiety and depression 01/14/2017  . Bipolar 1 disorder (HCC)   . Depression   . Genital herpes 2000  . Hypertension   . Infection    UTI  . Ovarian cyst   . Schizoaffective disorder (HCC) 05/05/2016  . SVT (supraventricular tachycardia) [facility](HCC)     Patient Active Problem List   Diagnosis Date Noted  . Schizoaffective disorder (HCC) 03/30/2017  . Adjustment disorder with disturbance of emotion 03/29/2017  . Alcohol abuse with alcohol-induced mood disorder (HCC) 01/18/2017  . Anxiety and depression 01/14/2017  . Pulmonary embolism (HCC) 12/19/2016  . Alcohol withdrawal (HCC) 12/01/2016  . ADD (attention deficit disorder) 05/10/2016  . Opioid use disorder, mild, in controlled environment (HCC) 05/06/2016  . Essential hypertension 05/06/2016  . Alcohol use disorder, moderate, in early remission, dependence (HCC) 05/06/2016  . Bipolar I disorder, most recent episode manic, severe with psychotic features (HCC) 01/20/2016  . Chronic pain syndrome     Past Surgical History:  Procedure Laterality Date  . addenoidectomy    . BREAST ENHANCEMENT SURGERY    . BREAST SURGERY    . KNEE SURGERY    . KNEE SURGERY Left     . TONSILLECTOMY    . WISDOM TOOTH EXTRACTION      OB History    Gravida Para Term Preterm AB Living   5 2 2  0 3 2   SAB TAB Ectopic Multiple Live Births   1 2 0 0 2       Home Medications    Prior to Admission medications   Medication Sig Start Date End Date Taking? Authorizing Provider  benztropine (COGENTIN) 0.5 MG tablet Take 1 tablet (0.5 mg total) by mouth 2 (two) times daily as needed for tremors (EPS). 04/17/17   Armandina StammerNwoko, Agnes I, NP  chlorproMAZINE (THORAZINE) 50 MG tablet Take 1 tablet (50 mg total) by mouth 3 (three) times daily. For agitation/mood control 04/17/17   Armandina StammerNwoko, Agnes I, NP  gabapentin (NEURONTIN) 300 MG capsule Take 1 capsule (300 mg total) by mouth 3 (three) times daily. For agitation 04/17/17   Armandina StammerNwoko, Agnes I, NP  hydrOXYzine (ATARAX/VISTARIL) 25 MG tablet Take 1 tablet ( 25 mg) by mouth four times daily as needed: For anxiety 04/17/17   Armandina StammerNwoko, Agnes I, NP  labetalol (NORMODYNE) 200 MG tablet Take 1 tablet (200 mg total) by mouth 2 (two) times daily. For high blood pressure 04/17/17   Nwoko, Nicole KindredAgnes I, NP  naltrexone (DEPADE) 50 MG tablet Take 1 tablet (50 mg total) by mouth daily. For alcoholism 04/18/17   Armandina StammerNwoko, Agnes I, NP  QUEtiapine (SEROQUEL) 300 MG tablet Take 2  tablets (600 mg total) by mouth at bedtime. For mood control 04/17/17   Armandina Stammer I, NP  rivaroxaban (XARELTO) 20 MG TABS tablet Take 1 tablet (20 mg total) by mouth daily. For prevention of blood clot 04/18/17   Sanjuana Kava, NP    Family History Family History  Problem Relation Age of Onset  . Alcoholism Unknown   . Leukemia Unknown   . Cervical cancer Unknown   . Bone cancer Unknown   . Cirrhosis Unknown   . Diabetes Mellitus II Unknown   . Depression Unknown   . Hypertension Unknown   . Thyroid cancer Unknown   . Diabetes Mellitus II Mother   . Hypertension Mother   . Alcoholism Father   . Mental illness Paternal Grandmother     Social History Social History   Tobacco Use  .  Smoking status: Never Smoker  . Smokeless tobacco: Never Used  Substance Use Topics  . Alcohol use: Yes    Alcohol/week: 0.0 oz    Comment: in rehab  . Drug use: No    Comment: in rehab     Allergies   Penicillins   Review of Systems Review of Systems  Constitutional: Positive for fatigue and fever. Negative for activity change.  HENT: Positive for congestion.   Respiratory: Negative for shortness of breath.   Cardiovascular: Negative for chest pain.  Gastrointestinal: Positive for diarrhea and vomiting. Negative for abdominal pain.  Musculoskeletal: Positive for arthralgias and myalgias. Negative for neck pain and neck stiffness.  All other systems reviewed and are negative.    Physical Exam Updated Vital Signs BP 111/74   Pulse 92   Temp (!) 101.7 F (38.7 C) (Oral)   Resp 16   LMP 04/25/2015 (Approximate) Comment: no LMP since 2017  SpO2 99%   Physical Exam  Constitutional: She is oriented to person, place, and time. She appears well-developed and well-nourished.  HENT:  Head: Normocephalic and atraumatic.  Eyes: EOM are normal. Pupils are equal, round, and reactive to light. Right eye exhibits no discharge. Left eye exhibits no discharge.  Cardiovascular: Regular rhythm.  Tachycardia.  Pulmonary/Chest: Effort normal and breath sounds normal. No stridor. No respiratory distress.  Abdominal: Soft. There is no tenderness.  Neurological: She is oriented to person, place, and time.  Skin: Skin is warm and dry. She is not diaphoretic.  Psychiatric: She has a normal mood and affect.  Nursing note and vitals reviewed.    ED Treatments / Results  Labs (all labs ordered are listed, but only abnormal results are displayed) Labs Reviewed  CBC WITH DIFFERENTIAL/PLATELET - Abnormal; Notable for the following components:      Result Value   WBC 12.5 (*)    Neutro Abs 11.2 (*)    Lymphs Abs 0.4 (*)    All other components within normal limits  COMPREHENSIVE  METABOLIC PANEL - Abnormal; Notable for the following components:   Sodium 130 (*)    Potassium 3.3 (*)    Chloride 95 (*)    Glucose, Bld 142 (*)    Calcium 8.8 (*)    AST 77 (*)    ALT 92 (*)    Alkaline Phosphatase 129 (*)    All other components within normal limits  URINALYSIS, ROUTINE W REFLEX MICROSCOPIC - Abnormal; Notable for the following components:   Hgb urine dipstick SMALL (*)    All other components within normal limits  URINALYSIS, MICROSCOPIC (REFLEX) - Abnormal; Notable for the following components:  Bacteria, UA RARE (*)    Squamous Epithelial / LPF 0-5 (*)    All other components within normal limits  PREGNANCY, URINE  INFLUENZA PANEL BY PCR (TYPE A & B)    EKG  EKG Interpretation None       Radiology Dg Chest 2 View  Result Date: 04/24/2017 CLINICAL DATA:  Fever, headache, cough EXAM: CHEST  2 VIEW COMPARISON:  Chest radiograph 01/13/2017 FINDINGS: Normal cardiac and mediastinal contours. No consolidative pulmonary opacities. No pleural effusion or pneumothorax. Regional skeleton is unremarkable. IMPRESSION: No acute cardiopulmonary process. Electronically Signed   By: Annia Belt M.D.   On: 04/24/2017 18:43    Procedures Procedures (including critical care time)  Medications Ordered in ED Medications  ibuprofen (ADVIL,MOTRIN) tablet 800 mg (800 mg Oral Given 04/24/17 1630)  ondansetron (ZOFRAN-ODT) disintegrating tablet 4 mg (4 mg Oral Given 04/24/17 1630)  sodium chloride 0.9 % bolus 1,000 mL (0 mLs Intravenous Stopped 04/24/17 1811)  prochlorperazine (COMPAZINE) injection 5 mg (5 mg Intravenous Given 04/24/17 1739)  diphenhydrAMINE (BENADRYL) injection 25 mg (25 mg Intravenous Given 04/24/17 1739)  acetaminophen (TYLENOL) tablet 650 mg (650 mg Oral Given 04/24/17 1837)  sodium chloride 0.9 % bolus 1,000 mL (1,000 mLs Intravenous New Bag/Given 04/24/17 2034)     Initial Impression / Assessment and Plan / ED Course  I have reviewed the triage vital  signs and the nursing notes.  Pertinent labs & imaging results that were available during my care of the patient were reviewed by me and considered in my medical decision making (see chart for details).    Patient is a 42 year old female presenting with anxiety depression both bipolar disorder presenting today with flulike symptoms.  Patient has been seen at the [facility] in order to help with her substance abuse.  She reports that everyone there has been 6.  3 days ago she started with nausea vomiting.  And high fevers.  This is the typical flulike symptoms this years.  No abdominal pain. ALready went through alcohol withdrawal 3 weeks ago.  5:27 PM Early patient has a flu.  Outside Tamiflu peroid  Will give of fluids.  Patient asking for something her headache.  Will Compazine plus Benadryl to help.  /9:07 PM Feels much improved.  After fluids ibuprofen and Tylenol.  Normalization of vital signs.  Will discharge home with flu precautions.  Eating and drinking, walking to the bathroom.   Final Clinical Impressions(s) / ED Diagnoses   Final diagnoses:  None    ED Discharge Orders    None       Abelino Derrick, MD 04/24/17 2108    Abelino Derrick, MD 04/24/17 2109

## 2017-04-24 NOTE — ED Notes (Signed)
Pt ambulating to bathroom without difficulty. eating and drinking without difficulty.

## 2017-04-24 NOTE — ED Notes (Signed)
Pt. returned from XR. 

## 2017-04-25 LAB — INFLUENZA PANEL BY PCR (TYPE A & B)
INFLAPCR: NEGATIVE
INFLBPCR: NEGATIVE

## 2017-05-25 ENCOUNTER — Other Ambulatory Visit (HOSPITAL_COMMUNITY): Payer: Self-pay | Admitting: Psychiatry

## 2017-08-31 ENCOUNTER — Encounter (HOSPITAL_COMMUNITY): Payer: Self-pay | Admitting: Emergency Medicine

## 2017-08-31 ENCOUNTER — Emergency Department (HOSPITAL_COMMUNITY)
Admission: EM | Admit: 2017-08-31 | Discharge: 2017-08-31 | Disposition: A | Discharge status: Home / Self Care | Payer: Medicaid Other | Attending: Emergency Medicine | Admitting: Emergency Medicine

## 2017-08-31 DIAGNOSIS — Z7901 Long term (current) use of anticoagulants: Secondary | ICD-10-CM | POA: Diagnosis not present

## 2017-08-31 DIAGNOSIS — I10 Essential (primary) hypertension: Secondary | ICD-10-CM | POA: Insufficient documentation

## 2017-08-31 DIAGNOSIS — R55 Syncope and collapse: Secondary | ICD-10-CM | POA: Diagnosis present

## 2017-08-31 DIAGNOSIS — R42 Dizziness and giddiness: Secondary | ICD-10-CM | POA: Diagnosis not present

## 2017-08-31 DIAGNOSIS — Z79899 Other long term (current) drug therapy: Secondary | ICD-10-CM | POA: Diagnosis not present

## 2017-08-31 LAB — URINALYSIS, ROUTINE W REFLEX MICROSCOPIC
Bilirubin Urine: NEGATIVE
Glucose, UA: NEGATIVE mg/dL
KETONES UR: NEGATIVE mg/dL
Nitrite: NEGATIVE
PH: 7 (ref 5.0–8.0)
PROTEIN: NEGATIVE mg/dL
Specific Gravity, Urine: 1.009 (ref 1.005–1.030)

## 2017-08-31 LAB — I-STAT BETA HCG BLOOD, ED (MC, WL, AP ONLY)

## 2017-08-31 LAB — CBC
HEMATOCRIT: 37.6 % (ref 36.0–46.0)
Hemoglobin: 12.4 g/dL (ref 12.0–15.0)
MCH: 27.7 pg (ref 26.0–34.0)
MCHC: 33 g/dL (ref 30.0–36.0)
MCV: 84.1 fL (ref 78.0–100.0)
Platelets: 287 10*3/uL (ref 150–400)
RBC: 4.47 MIL/uL (ref 3.87–5.11)
RDW: 13.5 % (ref 11.5–15.5)
WBC: 15.8 10*3/uL — AB (ref 4.0–10.5)

## 2017-08-31 LAB — BASIC METABOLIC PANEL
Anion gap: 8 (ref 5–15)
BUN: 11 mg/dL (ref 6–20)
CHLORIDE: 98 mmol/L — AB (ref 101–111)
CO2: 32 mmol/L (ref 22–32)
CREATININE: 0.84 mg/dL (ref 0.44–1.00)
Calcium: 9.1 mg/dL (ref 8.9–10.3)
GFR calc Af Amer: >60 mL/min (ref >60)
GFR calc non Af Amer: >60 mL/min (ref >60)
Glucose, Bld: 123 mg/dL — ABNORMAL HIGH (ref 65–99)
POTASSIUM: 3.8 mmol/L (ref 3.5–5.1)
Sodium: 138 mmol/L (ref 135–145)

## 2017-08-31 LAB — CBG MONITORING, ED: Glucose-Capillary: 131 mg/dL — ABNORMAL HIGH (ref 65–99)

## 2017-08-31 LAB — LITHIUM LEVEL: Lithium Lvl: 0.59 mmol/L — ABNORMAL LOW (ref 0.60–1.20)

## 2017-08-31 MED ORDER — PROPRANOLOL HCL 20 MG PO TABS
20.00 | ORAL_TABLET | ORAL | Status: DC
Start: 2017-08-29 — End: 2017-08-31

## 2017-08-31 MED ORDER — ALBUTEROL SULFATE (2.5 MG/3ML) 0.083% IN NEBU
2.50 | INHALATION_SOLUTION | RESPIRATORY_TRACT | Status: DC
Start: ? — End: 2017-08-31

## 2017-08-31 MED ORDER — LITHIUM CARBONATE ER 300 MG PO TBCR
300.00 | EXTENDED_RELEASE_TABLET | ORAL | Status: DC
Start: 2017-08-30 — End: 2017-08-31

## 2017-08-31 MED ORDER — LORAZEPAM 2 MG/ML IJ SOLN
1.0000 mg | Freq: Once | INTRAMUSCULAR | Status: AC
  Administered 2017-08-31: 1 mg via INTRAVENOUS
  Filled 2017-08-31: qty 1

## 2017-08-31 MED ORDER — GUAIFENESIN 100 MG/5ML PO SYRP
200.00 | ORAL_SOLUTION | ORAL | Status: DC
Start: ? — End: 2017-08-31

## 2017-08-31 MED ORDER — ACETAMINOPHEN 325 MG PO TABS
650.00 | ORAL_TABLET | ORAL | Status: DC
Start: ? — End: 2017-08-31

## 2017-08-31 MED ORDER — DOCUSATE SODIUM 100 MG PO CAPS
100.00 | ORAL_CAPSULE | ORAL | Status: DC
Start: ? — End: 2017-08-31

## 2017-08-31 MED ORDER — ONDANSETRON HCL 8 MG PO TABS
4.00 | ORAL_TABLET | ORAL | Status: DC
Start: ? — End: 2017-08-31

## 2017-08-31 MED ORDER — BUPRENORPHINE HCL-NALOXONE HCL 8-2 MG SL SUBL
1.00 | SUBLINGUAL_TABLET | SUBLINGUAL | Status: DC
Start: 2017-08-30 — End: 2017-08-31

## 2017-08-31 MED ORDER — LITHIUM CARBONATE ER 300 MG PO TBCR
600.00 | EXTENDED_RELEASE_TABLET | ORAL | Status: DC
Start: 2017-08-29 — End: 2017-08-31

## 2017-08-31 MED ORDER — RIVAROXABAN 20 MG PO TABS
20.00 | ORAL_TABLET | ORAL | Status: DC
Start: 2017-08-29 — End: 2017-08-31

## 2017-08-31 MED ORDER — ONDANSETRON HCL 8 MG PO TABS
4.00 | ORAL_TABLET | ORAL | Status: DC
Start: 2017-08-30 — End: 2017-08-31

## 2017-08-31 MED ORDER — OLANZAPINE 10 MG PO TABS
10.00 | ORAL_TABLET | ORAL | Status: DC
Start: 2017-08-29 — End: 2017-08-31

## 2017-08-31 MED ORDER — POLYETHYLENE GLYCOL 3350 17 G PO PACK
17.00 | PACK | ORAL | Status: DC
Start: ? — End: 2017-08-31

## 2017-08-31 MED ORDER — GENERIC EXTERNAL MEDICATION
Status: DC
Start: ? — End: 2017-08-31

## 2017-08-31 MED ORDER — HYDROXYZINE HCL 25 MG PO TABS
50.00 | ORAL_TABLET | ORAL | Status: DC
Start: ? — End: 2017-08-31

## 2017-08-31 MED ORDER — SODIUM CHLORIDE 0.9 % IV BOLUS
1000.0000 mL | Freq: Once | INTRAVENOUS | Status: AC
Start: 2017-08-31 — End: 2017-08-31
  Administered 2017-08-31: 1000 mL via INTRAVENOUS

## 2017-08-31 NOTE — ED Provider Notes (Signed)
[facility]-EMERGENCY DEPT Provider Note   CSN: [id number] Arrival date & time: 08/31/17  1155     History   Chief Complaint Chief Complaint  Patient presents with  . Near Syncope    HPI Megan Edwards is a 42 y.o. female.  HPI Patient is a 42 year old female who reports near syncope and lightheadedness of the past 3 days.  She was just recently released from the hospital 4 days ago and treated for pneumonia.  Her Seroquel was recently discontinued.  She is on lithium.  She denies fevers or chills.  No productive cough.  No palpitations.  She currently is on Suboxone for history of opioid addiction.  She is been compliant with all of her meds otherwise.  Denies black or bloody stools.  No heavy menstrual cycles.  Is on Xarelto for history of ulnar embolism.  Symptoms are mild in severity.  She feels slightly anxious at this time.   Past Medical History:  Diagnosis Date  . Abnormal Pap smear   . Alcohol abuse   . Anxiety and depression 01/14/2017  . Bipolar 1 disorder (HCC)   . Depression   . Genital herpes 2000  . Hypertension   . Infection    UTI  . Ovarian cyst   . Schizoaffective disorder (HCC) 05/05/2016  . SVT (supraventricular tachycardia) [facility](HCC)     Patient Active Problem List   Diagnosis Date Noted  . Schizoaffective disorder (HCC) 03/30/2017  . Adjustment disorder with disturbance of emotion 03/29/2017  . Alcohol abuse with alcohol-induced mood disorder (HCC) 01/18/2017  . Anxiety and depression 01/14/2017  . Pulmonary embolism (HCC) 12/19/2016  . Alcohol withdrawal (HCC) 12/01/2016  . ADD (attention deficit disorder) 05/10/2016  . Opioid use disorder, mild, in controlled environment (HCC) 05/06/2016  . Essential hypertension 05/06/2016  . Alcohol use disorder, moderate, in early remission, dependence (HCC) 05/06/2016  . Bipolar I disorder, most recent episode manic, severe with psychotic features (HCC) 01/20/2016  . Chronic pain  syndrome     Past Surgical History:  Procedure Laterality Date  . addenoidectomy    . BREAST ENHANCEMENT SURGERY    . BREAST SURGERY    . KNEE SURGERY    . KNEE SURGERY Left   . TONSILLECTOMY    . WISDOM TOOTH EXTRACTION       OB History    Gravida  5   Para  2   Term  2   Preterm  0   AB  3   Living  2     SAB  1   TAB  2   Ectopic  0   Multiple  0   Live Births  2            Home Medications    Prior to Admission medications   Medication Sig Start Date End Date Taking? Authorizing Provider  hydrOXYzine (ATARAX/VISTARIL) 25 MG tablet Take 1 tablet ( 25 mg) by mouth four times daily as needed: For anxiety 04/17/17  Yes Nwoko, Nicole KindredAgnes I, NP  lithium carbonate (LITHOBID) 300 MG CR tablet TK 1 T PO QAM AND T 2 TS PO NIGHTLY 08/28/17  Yes [provider]  OLANZapine (ZYPREXA) 5 MG tablet TK 1 T PO QAM AND 3 TS QHS 08/07/17  Yes [provider]  ondansetron (ZOFRAN ODT) 4 MG disintegrating tablet Take 1 tablet (4 mg total) by mouth every 8 (eight) hours as needed for nausea or vomiting. 04/24/17  Yes Mackuen, Cindee Saltourteney Lyn, MD  propranolol (INDERAL) 20 MG tablet  08/28/17  Yes [provider]  QUEtiapine (SEROQUEL) 100 MG tablet Take 100 mg by mouth at bedtime. 08/07/17  Yes [provider]  rivaroxaban (XARELTO) 20 MG TABS tablet Take 1 tablet (20 mg total) by mouth daily. For prevention of blood clot 04/18/17  Yes Nwoko, Agnes I, NP  SUBOXONE 8-2 MG FILM PLACE 1 FILM UNDER THE TONGUE BID FOR 28 DAYS 07/29/17  Yes [provider]  benztropine (COGENTIN) 0.5 MG tablet Take 1 tablet (0.5 mg total) by mouth 2 (two) times daily as needed for tremors (EPS). Patient not taking: Reported on 08/31/2017 04/17/17   Armandina Stammer I, NP  chlorproMAZINE (THORAZINE) 50 MG tablet Take 1 tablet (50 mg total) by mouth 3 (three) times daily. For agitation/mood control Patient not taking: Reported on 08/31/2017 04/17/17   Armandina Stammer I, NP  gabapentin  (NEURONTIN) 300 MG capsule Take 1 capsule (300 mg total) by mouth 3 (three) times daily. For agitation Patient not taking: Reported on 08/31/2017 04/17/17   Armandina Stammer I, NP  labetalol (NORMODYNE) 200 MG tablet Take 1 tablet (200 mg total) by mouth 2 (two) times daily. For high blood pressure Patient not taking: Reported on 08/31/2017 04/17/17   Armandina Stammer I, NP  naltrexone (DEPADE) 50 MG tablet Take 1 tablet (50 mg total) by mouth daily. For alcoholism Patient not taking: Reported on 08/31/2017 04/18/17   Armandina Stammer I, NP  QUEtiapine (SEROQUEL) 300 MG tablet Take 2 tablets (600 mg total) by mouth at bedtime. For mood control Patient not taking: Reported on 08/31/2017 04/17/17   Sanjuana Kava, NP    Family History Family History  Problem Relation Age of Onset  . Alcoholism Unknown   . Leukemia Unknown   . Cervical cancer Unknown   . Bone cancer Unknown   . Cirrhosis Unknown   . Diabetes Mellitus II Unknown   . Depression Unknown   . Hypertension Unknown   . Thyroid cancer Unknown   . Diabetes Mellitus II Mother   . Hypertension Mother   . Alcoholism Father   . Mental illness Paternal Grandmother     Social History Social History   Tobacco Use  . Smoking status: Never Smoker  . Smokeless tobacco: Never Used  Substance Use Topics  . Alcohol use: Yes    Alcohol/week: 0.0 oz    Comment: in rehab  . Drug use: No    Comment: in rehab     Allergies   Penicillins   Review of Systems Review of Systems  All other systems reviewed and are negative.    Physical Exam Updated Vital Signs BP (!) 123/94   Pulse 87   Temp 98.8 F (37.1 C) (Oral)   Resp 18   SpO2 93%   Physical Exam  Constitutional: She is oriented to person, place, and time. She appears well-developed and well-nourished. No distress.  HENT:  Head: Normocephalic and atraumatic.  Eyes: EOM are normal.  Neck: Normal range of motion.  Cardiovascular: Normal rate, regular rhythm and normal heart sounds.    Pulmonary/Chest: Effort normal and breath sounds normal.  Abdominal: Soft. She exhibits no distension. There is no tenderness.  Musculoskeletal: Normal range of motion.  Mild tremor  Neurological: She is alert and oriented to person, place, and time.  5 out of 5 strength in her bilateral upper and lower extremity major muscle groups  Skin: Skin is warm and dry.  Psychiatric:  Anxious appearing  Nursing note  and vitals reviewed.    ED Treatments / Results  Labs (all labs ordered are listed, but only abnormal results are displayed) Labs Reviewed  BASIC METABOLIC PANEL - Abnormal; Notable for the following components:      Result Value   Chloride 98 (*)    Glucose, Bld 123 (*)    All other components within normal limits  CBC - Abnormal; Notable for the following components:   WBC 15.8 (*)    All other components within normal limits  URINALYSIS, ROUTINE W REFLEX MICROSCOPIC - Abnormal; Notable for the following components:   APPearance HAZY (*)    Hgb urine dipstick SMALL (*)    Leukocytes, UA TRACE (*)    Bacteria, UA RARE (*)    All other components within normal limits  CBG MONITORING, ED - Abnormal; Notable for the following components:   Glucose-Capillary 131 (*)    All other components within normal limits  LITHIUM LEVEL  I-STAT BETA HCG BLOOD, ED ([facility], [facility], [facility] ONLY)    EKG EKG Interpretation  Date/Time:  Sunday August 31 2017 12:09:09 EDT Ventricular Rate:  94 PR Interval:    QRS Duration: 87 QT Interval:  340 QTC Calculation: 426 R Axis:   80 Text Interpretation:  Sinus rhythm Anteroseptal infarct, age indeterminate No significant change was found Confirmed by Azalia Bilis ([id number]) on 08/31/2017 4:05:09 PM   Radiology No results found.  Procedures Procedures (including critical care time)  Medications Ordered in ED Medications  sodium chloride 0.9 % bolus 1,000 mL (0 mLs Intravenous Stopped 08/31/17 1409)  LORazepam (ATIVAN) injection 1 mg (1 mg Intravenous  Given 08/31/17 1458)     Initial Impression / Assessment and Plan / ED Course  I have reviewed the triage vital signs and the nursing notes.  Pertinent labs & imaging results that were available during my care of the patient were reviewed by me and considered in my medical decision making (see chart for details).     Overall well-appearing.  Slightly anxious.  IV fluids.  No significant orthostasis noted.  Labs without significant abnormality.  Lithium level pending.  Overall well-appearing.  Feels better after Ativan.  Final Clinical Impressions(s) / ED Diagnoses   Final diagnoses:  None    ED Discharge Orders    None       Azalia Bilis, MD 08/31/17 [phone]

## 2017-08-31 NOTE — ED Notes (Signed)
ED Provider at bedside. 

## 2017-08-31 NOTE — ED Triage Notes (Signed)
Pt reports that she has had near syncope episodes of the past 3 days. Reports she got out of hospital 4-5 days ago for PNA. Reports she was in home for rehab for her opioid addiction but cant go back there due to having to bad of medical problems.

## 2017-08-31 NOTE — ED Notes (Signed)
Pt's  EKG was given to Dr. Patria Maneampos.

## 2017-08-31 NOTE — ED Notes (Signed)
RN waiting for Lithium level to populate.

## 2017-10-03 ENCOUNTER — Encounter (HOSPITAL_COMMUNITY): Payer: Self-pay | Admitting: Emergency Medicine

## 2017-10-03 ENCOUNTER — Other Ambulatory Visit: Payer: Self-pay

## 2017-10-03 ENCOUNTER — Emergency Department (HOSPITAL_COMMUNITY): Payer: Medicaid Other

## 2017-10-03 ENCOUNTER — Emergency Department (HOSPITAL_COMMUNITY)
Admission: EM | Admit: 2017-10-03 | Discharge: 2017-10-03 | Disposition: A | Discharge status: Home / Self Care | Payer: Medicaid Other | Attending: Emergency Medicine | Admitting: Emergency Medicine

## 2017-10-03 DIAGNOSIS — Z7901 Long term (current) use of anticoagulants: Secondary | ICD-10-CM | POA: Insufficient documentation

## 2017-10-03 DIAGNOSIS — I10 Essential (primary) hypertension: Secondary | ICD-10-CM | POA: Diagnosis not present

## 2017-10-03 DIAGNOSIS — Z79899 Other long term (current) drug therapy: Secondary | ICD-10-CM | POA: Diagnosis not present

## 2017-10-03 DIAGNOSIS — R251 Tremor, unspecified: Secondary | ICD-10-CM

## 2017-10-03 DIAGNOSIS — F10929 Alcohol use, unspecified with intoxication, unspecified: Secondary | ICD-10-CM | POA: Diagnosis present

## 2017-10-03 LAB — COMPREHENSIVE METABOLIC PANEL
ALK PHOS: 82 U/L (ref 38–126)
ALT: 66 U/L — AB (ref 0–44)
AST: 58 U/L — AB (ref 15–41)
Albumin: 3.9 g/dL (ref 3.5–5.0)
Anion gap: 15 (ref 5–15)
BUN: 12 mg/dL (ref 6–20)
CALCIUM: 8.7 mg/dL — AB (ref 8.9–10.3)
CHLORIDE: 100 mmol/L (ref 98–111)
CO2: 25 mmol/L (ref 22–32)
CREATININE: 0.73 mg/dL (ref 0.44–1.00)
Glucose, Bld: 103 mg/dL — ABNORMAL HIGH (ref 70–99)
Potassium: 4.3 mmol/L (ref 3.5–5.1)
Sodium: 140 mmol/L (ref 135–145)
Total Bilirubin: 0.5 mg/dL (ref 0.3–1.2)
Total Protein: 7.2 g/dL (ref 6.5–8.1)

## 2017-10-03 LAB — CBC WITH DIFFERENTIAL/PLATELET
Basophils Absolute: 0 10*3/uL (ref 0.0–0.1)
Basophils Relative: 0 %
EOS ABS: 0 10*3/uL (ref 0.0–0.7)
EOS PCT: 0 %
HCT: 40.4 % (ref 36.0–46.0)
Hemoglobin: 13.6 g/dL (ref 12.0–15.0)
LYMPHS ABS: 1.7 10*3/uL (ref 0.7–4.0)
Lymphocytes Relative: 21 %
MCH: 27.4 pg (ref 26.0–34.0)
MCHC: 33.7 g/dL (ref 30.0–36.0)
MCV: 81.3 fL (ref 78.0–100.0)
MONOS PCT: 6 %
Monocytes Absolute: 0.5 10*3/uL (ref 0.1–1.0)
Neutro Abs: 5.7 10*3/uL (ref 1.7–7.7)
Neutrophils Relative %: 73 %
PLATELETS: 325 10*3/uL (ref 150–400)
RBC: 4.97 MIL/uL (ref 3.87–5.11)
RDW: 14.9 % (ref 11.5–15.5)
WBC: 7.9 10*3/uL (ref 4.0–10.5)

## 2017-10-03 LAB — RAPID URINE DRUG SCREEN, HOSP PERFORMED
AMPHETAMINES: NOT DETECTED
Benzodiazepines: NOT DETECTED
Cocaine: NOT DETECTED
OPIATES: NOT DETECTED
Tetrahydrocannabinol: NOT DETECTED

## 2017-10-03 LAB — ETHANOL

## 2017-10-03 LAB — MAGNESIUM: Magnesium: 1.8 mg/dL (ref 1.7–2.4)

## 2017-10-03 LAB — I-STAT BETA HCG BLOOD, ED (MC, WL, AP ONLY)

## 2017-10-03 LAB — LITHIUM LEVEL: Lithium Lvl: <0.06 mmol/L — ABNORMAL LOW (ref 0.60–1.20)

## 2017-10-03 MED ORDER — SODIUM CHLORIDE 0.9 % IV BOLUS (SEPSIS)
1000.0000 mL | Freq: Once | INTRAVENOUS | Status: AC
  Administered 2017-10-03: 1000 mL via INTRAVENOUS

## 2017-10-03 MED ORDER — LORAZEPAM 2 MG/ML IJ SOLN
1.0000 mg | Freq: Once | INTRAMUSCULAR | Status: AC
  Administered 2017-10-03: 1 mg via INTRAVENOUS
  Filled 2017-10-03: qty 1

## 2017-10-03 MED ORDER — SODIUM CHLORIDE 0.9 % IV SOLN
1000.0000 mL | INTRAVENOUS | Status: DC
  Administered 2017-10-03: 1000 mL via INTRAVENOUS

## 2017-10-03 MED ORDER — LORAZEPAM 1 MG PO TABS: 1.0000 mg | ORAL_TABLET | Freq: Once | ORAL | Status: DC

## 2017-10-03 NOTE — ED Notes (Signed)
Patient has been ambulating to and from restroom this morning with steady gait and no assistance.

## 2017-10-03 NOTE — ED Notes (Signed)
Bed: [facility] Expected date:  Expected time:  Means of arrival:  Comments: 

## 2017-10-03 NOTE — ED Provider Notes (Signed)
[facility]-EMERGENCY DEPT Provider Note   CSN: [id number] Arrival date & time: 10/03/17  1191     History   Chief Complaint Chief Complaint  Patient presents with  . Alcohol Intoxication    HPI Megan Edwards is a 42 y.o. female.  HPI Patient presents to the emergency room for evaluation of weakness and shakiness.  Patient is an alcoholic.  She also has a history of opiate abuse and is on Suboxone.  Patient states she began drinking alcohol again last night after abstaining for several months.  Patient states she started having episodes of vomiting and diarrhea yesterday.  She feels very weak and shaky today.  She is having intermittent tremors and also feels like her muscles start to give way.  She denies any trouble with any chest pain.  No headache or head injury.  No numbness.  No abdominal pain.  No fevers. Past Medical History:  Diagnosis Date  . Abnormal Pap smear   . Alcohol abuse   . Anxiety and depression 01/14/2017  . Bipolar 1 disorder (HCC)   . Depression   . Genital herpes 2000  . Hypertension   . Infection    UTI  . Ovarian cyst   . Schizoaffective disorder (HCC) 05/05/2016  . SVT (supraventricular tachycardia) [facility])     Patient Active Problem List   Diagnosis Date Noted  . Schizoaffective disorder (HCC) 03/30/2017  . Adjustment disorder with disturbance of emotion 03/29/2017  . Alcohol abuse with alcohol-induced mood disorder (HCC) 01/18/2017  . Anxiety and depression 01/14/2017  . Pulmonary embolism (HCC) 12/19/2016  . Alcohol withdrawal (HCC) 12/01/2016  . ADD (attention deficit disorder) 05/10/2016  . Opioid use disorder, mild, in controlled environment (HCC) 05/06/2016  . Essential hypertension 05/06/2016  . Alcohol use disorder, moderate, in early remission, dependence (HCC) 05/06/2016  . Bipolar I disorder, most recent episode manic, severe with psychotic features (HCC) 01/20/2016  . Chronic pain syndrome     Past  Surgical History:  Procedure Laterality Date  . addenoidectomy    . BREAST ENHANCEMENT SURGERY    . BREAST SURGERY    . KNEE SURGERY    . KNEE SURGERY Left   . TONSILLECTOMY    . WISDOM TOOTH EXTRACTION       OB History    Gravida  5   Para  2   Term  2   Preterm  0   AB  3   Living  2     SAB  1   TAB  2   Ectopic  0   Multiple  0   Live Births  2            Home Medications    Prior to Admission medications   Medication Sig Start Date End Date Taking? Authorizing Provider  hydrOXYzine (ATARAX/VISTARIL) 25 MG tablet Take 1 tablet ( 25 mg) by mouth four times daily as needed: For anxiety Patient taking differently: Take 50 mg by mouth 3 (three) times daily.  04/17/17  Yes Armandina Stammer I, NP  lithium carbonate (LITHOBID) 300 MG CR tablet TAKE 1 TABLET IN THE MORNING AND 2 TABLETS AT NIGHT 08/28/17  Yes [provider]  OLANZapine (ZYPREXA) 10 MG tablet Take 10 mg by mouth 2 (two) times daily.  08/28/17  Yes [provider]  propranolol (INDERAL) 20 MG tablet Take 20 mg by mouth 2 (two) times daily.  08/28/17  Yes [provider]  QUEtiapine (SEROQUEL) 100 MG  tablet Take 100 mg by mouth at bedtime. 08/07/17  Yes [provider]  rivaroxaban (XARELTO) 20 MG TABS tablet Take 1 tablet (20 mg total) by mouth daily. For prevention of blood clot 04/18/17  Yes Nwoko, Agnes I, NP  SUBOXONE 8-2 MG FILM PLACE 1 FILM UNDER THE TONGUE TWICE DAILY 07/29/17  Yes [provider]  benztropine (COGENTIN) 0.5 MG tablet Take 1 tablet (0.5 mg total) by mouth 2 (two) times daily as needed for tremors (EPS). Patient not taking: Reported on 08/31/2017 04/17/17   Armandina Stammer I, NP  chlorproMAZINE (THORAZINE) 50 MG tablet Take 1 tablet (50 mg total) by mouth 3 (three) times daily. For agitation/mood control Patient not taking: Reported on 08/31/2017 04/17/17   Armandina Stammer I, NP  gabapentin (NEURONTIN) 300 MG capsule Take 1 capsule (300 mg total) by mouth 3  (three) times daily. For agitation Patient not taking: Reported on 08/31/2017 04/17/17   Armandina Stammer I, NP  labetalol (NORMODYNE) 200 MG tablet Take 1 tablet (200 mg total) by mouth 2 (two) times daily. For high blood pressure Patient not taking: Reported on 08/31/2017 04/17/17   Armandina Stammer I, NP  naltrexone (DEPADE) 50 MG tablet Take 1 tablet (50 mg total) by mouth daily. For alcoholism Patient not taking: Reported on 08/31/2017 04/18/17   Armandina Stammer I, NP  ondansetron (ZOFRAN ODT) 4 MG disintegrating tablet Take 1 tablet (4 mg total) by mouth every 8 (eight) hours as needed for nausea or vomiting. Patient not taking: Reported on 10/03/2017 04/24/17   Mackuen, Courteney Lyn, MD  QUEtiapine (SEROQUEL) 300 MG tablet Take 2 tablets (600 mg total) by mouth at bedtime. For mood control Patient not taking: Reported on 08/31/2017 04/17/17   Sanjuana Kava, NP    Family History Family History  Problem Relation Age of Onset  . Alcoholism Unknown   . Leukemia Unknown   . Cervical cancer Unknown   . Bone cancer Unknown   . Cirrhosis Unknown   . Diabetes Mellitus II Unknown   . Depression Unknown   . Hypertension Unknown   . Thyroid cancer Unknown   . Diabetes Mellitus II Mother   . Hypertension Mother   . Alcoholism Father   . Mental illness Paternal Grandmother     Social History Social History   Tobacco Use  . Smoking status: Never Smoker  . Smokeless tobacco: Never Used  Substance Use Topics  . Alcohol use: Yes    Alcohol/week: 0.0 oz    Comment: in rehab  . Drug use: No    Comment: in rehab     Allergies   Penicillins   Review of Systems Review of Systems  All other systems reviewed and are negative.    Physical Exam Updated Vital Signs BP (!) 134/97   Pulse 77   Temp 98.2 F (36.8 C) (Oral)   Resp 15   LMP 08/03/2017   SpO2 98%   Physical Exam  Constitutional: She appears well-developed and well-nourished. No distress.  HENT:  Head: Normocephalic and atraumatic.    Right Ear: External ear normal.  Left Ear: External ear normal.  Eyes: Conjunctivae are normal. Right eye exhibits no discharge. Left eye exhibits no discharge. No scleral icterus.  Neck: Neck supple. No tracheal deviation present.  Cardiovascular: Normal rate, regular rhythm and intact distal pulses.  Pulmonary/Chest: Effort normal and breath sounds normal. No stridor. No respiratory distress. She has no wheezes. She has no rales.  Abdominal: Soft. Bowel sounds are normal.  She exhibits no distension. There is no tenderness. There is no rebound and no guarding.  Musculoskeletal: She exhibits no edema or tenderness.  Neurological: She is alert. She displays tremor. No cranial nerve deficit (no facial droop, extraocular movements intact, no slurred speech) or sensory deficit. She exhibits normal muscle tone. She displays no seizure activity. Coordination normal.  Generalized weakness although the patient is able to lift both her legs and arms off the bed, she does have intermittent myoclonus, No seizure activity  Skin: Skin is warm and dry. No rash noted.  Psychiatric: She has a normal mood and affect.  Nursing note and vitals reviewed.    ED Treatments / Results  Labs (all labs ordered are listed, but only abnormal results are displayed) Labs Reviewed  COMPREHENSIVE METABOLIC PANEL - Abnormal; Notable for the following components:      Result Value   Glucose, Bld 103 (*)    Calcium 8.7 (*)    AST 58 (*)    ALT 66 (*)    All other components within normal limits  RAPID URINE DRUG SCREEN, HOSP PERFORMED - Abnormal; Notable for the following components:   Barbiturates   (*)    Value: Result not available. Reagent lot number recalled by manufacturer.   All other components within normal limits  LITHIUM LEVEL - Abnormal; Notable for the following components:   Lithium Lvl <0.06 (*)    All other components within normal limits  ETHANOL  CBC WITH DIFFERENTIAL/PLATELET  MAGNESIUM   I-STAT BETA HCG BLOOD, ED ([facility], [facility], [facility] ONLY)    EKG EKG Interpretation  Date/Time:  Friday October 03 2017 08:20:07 EDT Ventricular Rate:  106 PR Interval:    QRS Duration: 92 QT Interval:  350 QTC Calculation: 465 R Axis:   79 Text Interpretation:  Sinus tachycardia Low voltage, precordial leads Since last tracing rate faster Confirmed by Linwood Dibbles [phone]) on 10/03/2017 8:25:44 AM Also confirmed by Linwood Dibbles [phone]), editor Barbette Hair [phone])  on 10/03/2017 11:20:57 AM   Radiology Ct Head Wo Contrast  Result Date: 10/03/2017 CLINICAL DATA:  Head trauma. EXAM: CT HEAD WITHOUT CONTRAST TECHNIQUE: Contiguous axial images were obtained from the base of the skull through the vertex without intravenous contrast. COMPARISON:  12/03/2016 FINDINGS: Brain: No evidence of acute infarction, hemorrhage, hydrocephalus, extra-axial collection or mass lesion/mass effect. Vascular: No hyperdense vessel or unexpected calcification. Skull: No osseous abnormality. Sinuses/Orbits: Visualized paranasal sinuses are clear. Visualized mastoid sinuses are clear. Visualized orbits demonstrate no focal abnormality. Other: None IMPRESSION: No acute intracranial pathology. Electronically Signed   By: Elige Ko   On: 10/03/2017 11:30    Procedures Procedures (including critical care time)  Medications Ordered in ED Medications  sodium chloride 0.9 % bolus 1,000 mL (0 mLs Intravenous Stopped 10/03/17 1045)    Followed by  sodium chloride 0.9 % bolus 1,000 mL (0 mLs Intravenous Stopped 10/03/17 1045)    Followed by  0.9 %  sodium chloride infusion (0 mLs Intravenous Stopped 10/03/17 1325)  LORazepam (ATIVAN) injection 1 mg (1 mg Intravenous Given 10/03/17 1045)     Initial Impression / Assessment and Plan / ED Course  I have reviewed the triage vital signs and the nursing notes.  Pertinent labs & imaging results that were available during my care of the patient were reviewed by me and considered in my medical  decision making (see chart for details).  Clinical Course as of Oct 03 1325  Fri Oct 03, 2017  1026 Patient  is requesting something for anxiety.  I will give her a dose of benzodiazepine.  Patient is tachycardic.  She does not appear tremulous now to suggest alcohol withdrawal but there could be a component of that.  Patient indicates she is not sure if she hit her head.  She is on anticoagulants so we will add a CT of her head.  Also will check her lithium level   [JK]    Clinical Course User Index [JK] Linwood DibblesKnapp, Shawndell Varas, MD   Patient presented to the emergency room this morning with concerns of weakness and shakiness after recently drinking alcohol again after a long period of sobriety.  Patient is ED work-up is reassuring.  No signs of dehydration, anemia or significant electrolyte abnormalities.  Patient's lithium levels not elevated.  I did do a CT scan because of her tremors and weakness and possible injury.  No acute findings noted on CT scan.  Patient does not appear to be withdrawing as she is only started to drink the last day or 2 and has been sober for a long period of time.  She has been able to walk around the ED without difficulties.  She is stable for discharge.   Final Clinical Impressions(s) / ED Diagnoses   Final diagnoses:  Tremor    ED Discharge Orders    None       Linwood DibblesKnapp, Yeray Tomas, MD 10/03/17 1327

## 2017-10-03 NOTE — Discharge Instructions (Addendum)
Continue your current medications, your lithium level was low so make sure to take that regularly have your doctor check on the levels

## 2017-10-03 NOTE — ED Notes (Signed)
PT unable to urinate at this time. Will attempt again after bolus.
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
## 2017-10-03 NOTE — ED Notes (Signed)
Patient is waiting on her ride. He said he would be here within the hour.

## 2017-10-13 ENCOUNTER — Emergency Department (HOSPITAL_COMMUNITY)
Admission: EM | Admit: 2017-10-13 | Discharge: 2017-10-13 | Disposition: A | Discharge status: Home / Self Care | Payer: Medicaid Other | Attending: Emergency Medicine | Admitting: Emergency Medicine

## 2017-10-13 ENCOUNTER — Encounter (HOSPITAL_COMMUNITY): Payer: Self-pay | Admitting: Family Medicine

## 2017-10-13 ENCOUNTER — Emergency Department (HOSPITAL_COMMUNITY): Payer: Medicaid Other

## 2017-10-13 DIAGNOSIS — M542 Cervicalgia: Secondary | ICD-10-CM | POA: Insufficient documentation

## 2017-10-13 DIAGNOSIS — T71193A Asphyxiation due to mechanical threat to breathing due to other causes, assault, initial encounter: Secondary | ICD-10-CM | POA: Diagnosis not present

## 2017-10-13 DIAGNOSIS — I10 Essential (primary) hypertension: Secondary | ICD-10-CM | POA: Diagnosis not present

## 2017-10-13 DIAGNOSIS — Z3202 Encounter for pregnancy test, result negative: Secondary | ICD-10-CM | POA: Diagnosis not present

## 2017-10-13 DIAGNOSIS — Y929 Unspecified place or not applicable: Secondary | ICD-10-CM | POA: Insufficient documentation

## 2017-10-13 DIAGNOSIS — Z79899 Other long term (current) drug therapy: Secondary | ICD-10-CM | POA: Diagnosis not present

## 2017-10-13 DIAGNOSIS — Z7901 Long term (current) use of anticoagulants: Secondary | ICD-10-CM | POA: Insufficient documentation

## 2017-10-13 DIAGNOSIS — Y9389 Activity, other specified: Secondary | ICD-10-CM | POA: Diagnosis not present

## 2017-10-13 DIAGNOSIS — R1110 Vomiting, unspecified: Secondary | ICD-10-CM | POA: Insufficient documentation

## 2017-10-13 LAB — I-STAT CHEM 8, ED
BUN: 11 mg/dL (ref 6–20)
CALCIUM ION: 1.1 mmol/L — AB (ref 1.15–1.40)
Chloride: 105 mmol/L (ref 98–111)
Creatinine, Ser: 0.9 mg/dL (ref 0.44–1.00)
Glucose, Bld: 107 mg/dL — ABNORMAL HIGH (ref 70–99)
HCT: 45 % (ref 36.0–46.0)
HEMOGLOBIN: 15.3 g/dL — AB (ref 12.0–15.0)
Potassium: 4.2 mmol/L (ref 3.5–5.1)
SODIUM: 141 mmol/L (ref 135–145)
TCO2: 26 mmol/L (ref 22–32)

## 2017-10-13 LAB — CBC WITH DIFFERENTIAL/PLATELET
BASOS ABS: 0 10*3/uL (ref 0.0–0.1)
BASOS PCT: 1 %
EOS ABS: 0.1 10*3/uL (ref 0.0–0.7)
Eosinophils Relative: 2 %
HCT: 43.7 % (ref 36.0–46.0)
Hemoglobin: 14.5 g/dL (ref 12.0–15.0)
Lymphocytes Relative: 30 %
Lymphs Abs: 2.5 10*3/uL (ref 0.7–4.0)
MCH: 27.6 pg (ref 26.0–34.0)
MCHC: 33.2 g/dL (ref 30.0–36.0)
MCV: 83.2 fL (ref 78.0–100.0)
MONO ABS: 0.3 10*3/uL (ref 0.1–1.0)
MONOS PCT: 3 %
Neutro Abs: 5.4 10*3/uL (ref 1.7–7.7)
Neutrophils Relative %: 64 %
Platelets: 299 10*3/uL (ref 150–400)
RBC: 5.25 MIL/uL — ABNORMAL HIGH (ref 3.87–5.11)
RDW: 14.6 % (ref 11.5–15.5)
WBC: 8.3 10*3/uL (ref 4.0–10.5)

## 2017-10-13 LAB — POC URINE PREG, ED: Preg Test, Ur: NEGATIVE

## 2017-10-13 MED ORDER — SODIUM CHLORIDE 0.9 % IV BOLUS
1000.0000 mL | Freq: Once | INTRAVENOUS | Status: AC
  Administered 2017-10-13: 1000 mL via INTRAVENOUS

## 2017-10-13 MED ORDER — ONDANSETRON HCL 4 MG/2ML IJ SOLN
4.0000 mg | Freq: Once | INTRAMUSCULAR | Status: AC
  Administered 2017-10-13: 4 mg via INTRAVENOUS
  Filled 2017-10-13: qty 2

## 2017-10-13 MED ORDER — LORAZEPAM 2 MG/ML IJ SOLN
0.5000 mg | Freq: Once | INTRAMUSCULAR | Status: AC
  Administered 2017-10-13: 0.5 mg via INTRAVENOUS
  Filled 2017-10-13: qty 1

## 2017-10-13 MED ORDER — ONDANSETRON HCL 4 MG PO TABS: 4.0000 mg | ORAL_TABLET | Freq: Four times a day (QID) | ORAL | 0 refills | Status: DC

## 2017-10-13 MED ORDER — IOPAMIDOL (ISOVUE-370) INJECTION 76%
100.0000 mL | Freq: Once | INTRAVENOUS | Status: AC | PRN
  Administered 2017-10-13: 75 mL via INTRAVENOUS

## 2017-10-13 MED ORDER — IOPAMIDOL (ISOVUE-370) INJECTION 76%
INTRAVENOUS | Status: AC
  Filled 2017-10-13: qty 100
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
## 2017-10-13 NOTE — ED Notes (Signed)
Discharge instructions reviewed with patient. Patient verbalizes understanding. VSS.   

## 2017-11-03 ENCOUNTER — Emergency Department (HOSPITAL_COMMUNITY)
Admission: EM | Admit: 2017-11-03 | Discharge: 2017-11-04 | Disposition: A | Discharge status: Other Acute Inpatient Hospital | Payer: Medicaid Other | Attending: Emergency Medicine | Admitting: Emergency Medicine

## 2017-11-03 ENCOUNTER — Other Ambulatory Visit: Payer: Self-pay

## 2017-11-03 ENCOUNTER — Encounter (HOSPITAL_COMMUNITY): Payer: Self-pay

## 2017-11-03 DIAGNOSIS — R45850 Homicidal ideations: Secondary | ICD-10-CM | POA: Insufficient documentation

## 2017-11-03 DIAGNOSIS — F332 Major depressive disorder, recurrent severe without psychotic features: Secondary | ICD-10-CM | POA: Insufficient documentation

## 2017-11-03 DIAGNOSIS — Z79899 Other long term (current) drug therapy: Secondary | ICD-10-CM | POA: Insufficient documentation

## 2017-11-03 DIAGNOSIS — I10 Essential (primary) hypertension: Secondary | ICD-10-CM | POA: Insufficient documentation

## 2017-11-03 DIAGNOSIS — R45851 Suicidal ideations: Secondary | ICD-10-CM | POA: Insufficient documentation

## 2017-11-03 DIAGNOSIS — Z7901 Long term (current) use of anticoagulants: Secondary | ICD-10-CM | POA: Insufficient documentation

## 2017-11-03 LAB — CBC
HEMATOCRIT: 44.4 % (ref 36.0–46.0)
Hemoglobin: 14.8 g/dL (ref 12.0–15.0)
MCH: 27.9 pg (ref 26.0–34.0)
MCHC: 33.3 g/dL (ref 30.0–36.0)
MCV: 83.6 fL (ref 78.0–100.0)
PLATELETS: 394 10*3/uL (ref 150–400)
RBC: 5.31 MIL/uL — ABNORMAL HIGH (ref 3.87–5.11)
RDW: 13.4 % (ref 11.5–15.5)
WBC: 11.8 10*3/uL — ABNORMAL HIGH (ref 4.0–10.5)

## 2017-11-03 LAB — COMPREHENSIVE METABOLIC PANEL
ALBUMIN: 4.4 g/dL (ref 3.5–5.0)
ALT: 26 U/L (ref 0–44)
AST: 22 U/L (ref 15–41)
Alkaline Phosphatase: 86 U/L (ref 38–126)
Anion gap: 12 (ref 5–15)
BILIRUBIN TOTAL: 0.5 mg/dL (ref 0.3–1.2)
BUN: 10 mg/dL (ref 6–20)
CHLORIDE: 105 mmol/L (ref 98–111)
CO2: 24 mmol/L (ref 22–32)
CREATININE: 0.82 mg/dL (ref 0.44–1.00)
Calcium: 9.6 mg/dL (ref 8.9–10.3)
GFR calc Af Amer: >60 mL/min (ref >60)
GLUCOSE: 119 mg/dL — AB (ref 70–99)
POTASSIUM: 3.4 mmol/L — AB (ref 3.5–5.1)
Sodium: 141 mmol/L (ref 135–145)
Total Protein: 7.5 g/dL (ref 6.5–8.1)

## 2017-11-03 LAB — I-STAT BETA HCG BLOOD, ED (MC, WL, AP ONLY)

## 2017-11-03 LAB — RAPID URINE DRUG SCREEN, HOSP PERFORMED
Amphetamines: NOT DETECTED
Barbiturates: NOT DETECTED
Benzodiazepines: POSITIVE — AB
Cocaine: NOT DETECTED
Opiates: NOT DETECTED
TETRAHYDROCANNABINOL: NOT DETECTED

## 2017-11-03 LAB — ACETAMINOPHEN LEVEL: Acetaminophen (Tylenol), Serum: <10 ug/mL — ABNORMAL LOW (ref 10–30)

## 2017-11-03 LAB — SALICYLATE LEVEL: Salicylate Lvl: <7 mg/dL (ref 2.8–30.0)

## 2017-11-03 LAB — I-STAT TROPONIN, ED
TROPONIN I, POC: 0 ng/mL (ref 0.00–0.08)
Troponin i, poc: 0 ng/mL (ref 0.00–0.08)

## 2017-11-03 LAB — ETHANOL: ALCOHOL ETHYL (B): 119 mg/dL — AB (ref <10)

## 2017-11-03 MED ORDER — ALUM & MAG HYDROXIDE-SIMETH 200-200-20 MG/5ML PO SUSP: 30.0000 mL | Freq: Four times a day (QID) | ORAL | Status: DC | PRN

## 2017-11-03 MED ORDER — ONDANSETRON HCL 4 MG PO TABS
4.0000 mg | ORAL_TABLET | Freq: Three times a day (TID) | ORAL | Status: DC | PRN
  Administered 2017-11-03: 4 mg via ORAL
  Filled 2017-11-03: qty 1

## 2017-11-03 MED ORDER — HYDROXYZINE HCL 50 MG PO TABS
50.0000 mg | ORAL_TABLET | Freq: Three times a day (TID) | ORAL | Status: DC
  Administered 2017-11-03 – 2017-11-04 (×2): 50 mg via ORAL
  Filled 2017-11-03 (×2): qty 1

## 2017-11-03 MED ORDER — NICOTINE 21 MG/24HR TD PT24
21.0000 mg | MEDICATED_PATCH | Freq: Every day | TRANSDERMAL | Status: DC
  Filled 2017-11-03 (×2): qty 1

## 2017-11-03 MED ORDER — PROPRANOLOL HCL 40 MG PO TABS
20.0000 mg | ORAL_TABLET | Freq: Two times a day (BID) | ORAL | Status: DC
Start: 2017-11-03 — End: 2017-11-04
  Administered 2017-11-03 – 2017-11-04 (×2): 20 mg via ORAL
  Filled 2017-11-03 (×2): qty 1

## 2017-11-03 MED ORDER — ZOLPIDEM TARTRATE 5 MG PO TABS: 5.0000 mg | ORAL_TABLET | Freq: Every evening | ORAL | Status: DC | PRN

## 2017-11-03 MED ORDER — OLANZAPINE 5 MG PO TABS
10.0000 mg | ORAL_TABLET | Freq: Two times a day (BID) | ORAL | Status: DC
  Administered 2017-11-03 – 2017-11-04 (×2): 10 mg via ORAL
  Filled 2017-11-03 (×2): qty 2

## 2017-11-03 MED ORDER — LORAZEPAM 1 MG PO TABS: 0.0000 mg | ORAL_TABLET | Freq: Two times a day (BID) | ORAL | Status: DC

## 2017-11-03 MED ORDER — BUPRENORPHINE HCL-NALOXONE HCL 8-2 MG SL SUBL
1.0000 | SUBLINGUAL_TABLET | Freq: Two times a day (BID) | SUBLINGUAL | Status: DC
  Administered 2017-11-03 – 2017-11-04 (×2): 1 via SUBLINGUAL
  Filled 2017-11-03 (×2): qty 1

## 2017-11-03 MED ORDER — VITAMIN B-1 100 MG PO TABS
100.0000 mg | ORAL_TABLET | Freq: Every day | ORAL | Status: DC
  Administered 2017-11-03 – 2017-11-04 (×2): 100 mg via ORAL
  Filled 2017-11-03 (×2): qty 1

## 2017-11-03 MED ORDER — LABETALOL HCL 200 MG PO TABS
200.0000 mg | ORAL_TABLET | Freq: Two times a day (BID) | ORAL | Status: DC
  Administered 2017-11-03 – 2017-11-04 (×2): 200 mg via ORAL
  Filled 2017-11-03 (×2): qty 1

## 2017-11-03 MED ORDER — LORAZEPAM 1 MG PO TABS
1.0000 mg | ORAL_TABLET | Freq: Once | ORAL | Status: AC
  Administered 2017-11-03: 1 mg via ORAL
  Filled 2017-11-03: qty 1

## 2017-11-03 MED ORDER — LORAZEPAM 2 MG/ML IJ SOLN: 0.0000 mg | Freq: Four times a day (QID) | INTRAMUSCULAR | Status: DC

## 2017-11-03 MED ORDER — LORAZEPAM 2 MG/ML IJ SOLN: 0.0000 mg | Freq: Two times a day (BID) | INTRAMUSCULAR | Status: DC

## 2017-11-03 MED ORDER — ACETAMINOPHEN 325 MG PO TABS: 650.0000 mg | ORAL_TABLET | ORAL | Status: DC | PRN

## 2017-11-03 MED ORDER — THIAMINE HCL 100 MG/ML IJ SOLN: 100.0000 mg | Freq: Every day | INTRAMUSCULAR | Status: DC

## 2017-11-03 MED ORDER — BUPRENORPHINE HCL-NALOXONE HCL 8-2 MG SL FILM
1.0000 | ORAL_FILM | Freq: Two times a day (BID) | SUBLINGUAL | Status: DC
Start: 2017-11-03 — End: 2017-11-03

## 2017-11-03 MED ORDER — LITHIUM CARBONATE ER 300 MG PO TBCR: 300.0000 mg | EXTENDED_RELEASE_TABLET | ORAL | Status: DC

## 2017-11-03 MED ORDER — RIVAROXABAN 20 MG PO TABS
20.0000 mg | ORAL_TABLET | Freq: Every day | ORAL | Status: DC
  Administered 2017-11-03 – 2017-11-04 (×2): 20 mg via ORAL
  Filled 2017-11-03 (×3): qty 1

## 2017-11-03 MED ORDER — QUETIAPINE FUMARATE 200 MG PO TABS
600.0000 mg | ORAL_TABLET | Freq: Every day | ORAL | Status: DC
  Administered 2017-11-03: 600 mg via ORAL
  Filled 2017-11-03: qty 3

## 2017-11-03 MED ORDER — LORAZEPAM 1 MG PO TABS
0.0000 mg | ORAL_TABLET | Freq: Four times a day (QID) | ORAL | Status: DC
  Administered 2017-11-03 – 2017-11-04 (×2): 1 mg via ORAL
  Filled 2017-11-03 (×2): qty 1

## 2017-11-03 NOTE — ED Notes (Signed)
Pt signed medical clearance policy  

## 2017-11-03 NOTE — ED Notes (Signed)
Regular dinner tray ordered 

## 2017-11-03 NOTE — ED Notes (Signed)
Pts belongings inventoried and stored in locker #1. Pt has valuables stored with security.

## 2017-11-03 NOTE — ED Provider Notes (Signed)
[facility] Provider Note   CSN: [id number] Arrival date & time: 11/03/17  1338   History   Chief Complaint Chief Complaint  Patient presents with  . Suicidal    HPI Megan Edwards is a 42 y.o. female with a hx of schizoaffective disorder, anxiety, depression, alcohol abuse, bipolar 1 disorder, hypertension, SVT, and pulmonary embolism with protein C deficiency on chronic Xarelto who presents to the ED with suicidal ideations over the past few days.  Patient states that she had a lot of stress going on lately, her family is threatening to take her children away, this ihas led to her suicidal ideations. Reports plan to 'take a lot of pills" which she has access to.  She also reports homicidal ideations with desire to hurt her mother.  Denies hallucinations.  Patient also reports that last night she felt a bit short of breath when she was trying to sleep.  She states at this time she was feeling very anxious and upset and tearful.  She states that she has had similar dyspnea with anxiety.  She states this is completely resolved at present.  She did not have any complaints of chest pain at any point in time.  She does have a history of prior pulmonary embolism approximately 8 months ago, currently on chronic Xarelto, however she has not had this for 10 days.  She states that her dyspnea last night did not feel at all similar to previous pulmonary embolism and definitely felt similar to previous anxiety problems. Denies leg pain/swelling, hemoptysis, recent surgery/trauma, recent long travel, hormone use, or personal hx of cancer.       HPI  Past Medical History:  Diagnosis Date  . Abnormal Pap smear   . Alcohol abuse   . Anxiety and depression 01/14/2017  . Bipolar 1 disorder (HCC)   . Depression   . Genital herpes 2000  . Hypertension   . Infection    UTI  . Ovarian cyst   . Schizoaffective disorder (HCC) 05/05/2016  . SVT (supraventricular  tachycardia) [facility](HCC)     Patient Active Problem List   Diagnosis Date Noted  . Schizoaffective disorder (HCC) 03/30/2017  . Adjustment disorder with disturbance of emotion 03/29/2017  . Alcohol abuse with alcohol-induced mood disorder (HCC) 01/18/2017  . Anxiety and depression 01/14/2017  . Pulmonary embolism (HCC) 12/19/2016  . Alcohol withdrawal (HCC) 12/01/2016  . ADD (attention deficit disorder) 05/10/2016  . Opioid use disorder, mild, in controlled environment (HCC) 05/06/2016  . Essential hypertension 05/06/2016  . Alcohol use disorder, moderate, in early remission, dependence (HCC) 05/06/2016  . Bipolar I disorder, most recent episode manic, severe with psychotic features (HCC) 01/20/2016  . Chronic pain syndrome     Past Surgical History:  Procedure Laterality Date  . addenoidectomy    . BREAST ENHANCEMENT SURGERY    . BREAST SURGERY    . KNEE SURGERY    . KNEE SURGERY Left   . TONSILLECTOMY    . WISDOM TOOTH EXTRACTION       OB History    Gravida  5   Para  2   Term  2   Preterm  0   AB  3   Living  2     SAB  1   TAB  2   Ectopic  0   Multiple  0   Live Births  2            Home Medications  Prior to Admission medications   Medication Sig Start Date End Date Taking? Authorizing Provider  hydrOXYzine (ATARAX/VISTARIL) 25 MG tablet Take 1 tablet ( 25 mg) by mouth four times daily as needed: For anxiety Patient taking differently: Take 50 mg by mouth 3 (three) times daily.  04/17/17   Armandina StammerNwoko, Agnes I, NP  labetalol (NORMODYNE) 200 MG tablet Take 1 tablet (200 mg total) by mouth 2 (two) times daily. For high blood pressure 04/17/17   Nwoko, Nicole KindredAgnes I, NP  lithium carbonate (LITHOBID) 300 MG CR tablet TAKE 1 TABLET IN THE MORNING AND 2 TABLETS AT NIGHT 08/28/17   [provider]  OLANZapine (ZYPREXA) 10 MG tablet Take 10 mg by mouth 2 (two) times daily.  08/28/17   [provider]  ondansetron (ZOFRAN) 4 MG tablet Take 1 tablet (4 mg  total) by mouth every 6 (six) hours. 10/13/17   Terrilee FilesButler, Michael C, MD  propranolol (INDERAL) 20 MG tablet Take 20 mg by mouth 2 (two) times daily.  08/28/17   [provider]  QUEtiapine (SEROQUEL) 300 MG tablet Take 2 tablets (600 mg total) by mouth at bedtime. For mood control 04/17/17   Armandina StammerNwoko, Agnes I, NP  rivaroxaban (XARELTO) 20 MG TABS tablet Take 1 tablet (20 mg total) by mouth daily. For prevention of blood clot 04/18/17   Armandina StammerNwoko, Agnes I, NP  SUBOXONE 8-2 MG FILM PLACE 1 FILM UNDER THE TONGUE TWICE DAILY 07/29/17   [provider]    Family History Family History  Problem Relation Age of Onset  . Alcoholism Unknown   . Leukemia Unknown   . Cervical cancer Unknown   . Bone cancer Unknown   . Cirrhosis Unknown   . Diabetes Mellitus II Unknown   . Depression Unknown   . Hypertension Unknown   . Thyroid cancer Unknown   . Diabetes Mellitus II Mother   . Hypertension Mother   . Alcoholism Father   . Mental illness Paternal Grandmother     Social History Social History   Tobacco Use  . Smoking status: Never Smoker  . Smokeless tobacco: Never Used  Substance Use Topics  . Alcohol use: Not Currently    Alcohol/week: 0.0 standard drinks    Comment: From older records, patient was in rehab. Butt, denies ETOH use for this visit.   . Drug use: Not Currently    Comment: From older records, patient was in rehab. But, denies any use.      Allergies   Penicillins   Review of Systems Review of Systems  Constitutional: Negative for fever.  Respiratory: Positive for shortness of breath (resolved at present).   Cardiovascular: Negative for chest pain, palpitations and leg swelling.  Gastrointestinal: Negative for abdominal pain, nausea and vomiting.  Neurological: Negative for syncope, weakness and numbness.  Psychiatric/Behavioral: Positive for sleep disturbance and suicidal ideas. Negative for hallucinations. The patient is nervous/anxious.   All other systems  reviewed and are negative.    Physical Exam Updated Vital Signs BP (!) 143/103 (BP Location: Right Arm)   Pulse (!) 113   Temp 98.6 F (37 C)   Resp 18   SpO2 95%   Physical Exam  Constitutional: She appears well-developed and well-nourished.  Non-toxic appearance. No distress.  HENT:  Head: Normocephalic and atraumatic.  Eyes: Conjunctivae are normal. Right eye exhibits no discharge. Left eye exhibits no discharge.  Neck: Neck supple.  Cardiovascular: Regular rhythm and intact distal pulses. Tachycardia present.  Pulmonary/Chest: Effort normal and breath sounds  normal. No respiratory distress. She has no wheezes. She has no rhonchi. She has no rales.  Respiration even and unlabored  Abdominal: Soft. She exhibits no distension. There is no tenderness.  Musculoskeletal: She exhibits no edema or tenderness.  Neurological: She is alert.  Clear speech.   Skin: Skin is warm and dry. No rash noted.  Psychiatric: Her behavior is normal. Her mood appears anxious. She expresses homicidal and suicidal ideation. She expresses suicidal plans.  Somewhat tearful throughout conversation.  Nursing note and vitals reviewed.   ED Treatments / Results  Labs (all labs ordered are listed, but only abnormal results are displayed) Labs Reviewed  CBC - Abnormal; Notable for the following components:      Result Value   WBC 11.8 (*)    RBC 5.31 (*)    All other components within normal limits  COMPREHENSIVE METABOLIC PANEL - Abnormal; Notable for the following components:   Potassium 3.4 (*)    Glucose, Bld 119 (*)    All other components within normal limits  ETHANOL - Abnormal; Notable for the following components:   Alcohol, Ethyl (B) 119 (*)    All other components within normal limits  ACETAMINOPHEN LEVEL - Abnormal; Notable for the following components:   Acetaminophen (Tylenol), Serum <10 (*)    All other components within normal limits  RAPID URINE DRUG SCREEN, HOSP PERFORMED -  Abnormal; Notable for the following components:   Benzodiazepines POSITIVE (*)    All other components within normal limits  SALICYLATE LEVEL  I-STAT TROPONIN, ED  I-STAT BETA HCG BLOOD, ED ([facility], [facility], [facility] ONLY)    EKG EKG Interpretation  Date/Time:  Monday November 03 2017 13:59:01 EDT Ventricular Rate:  109 PR Interval:  130 QRS Duration: 100 QT Interval:  326 QTC Calculation: 439 R Axis:   66 Text Interpretation:  Sinus tachycardia Otherwise normal ECG No significant change since last tracing Confirmed by Gwyneth Sprout ([id number]) on 11/03/2017 4:48:47 PM   Radiology No results found.  Procedures Procedures (including critical care time)  Medications Ordered in ED Medications  LORazepam (ATIVAN) tablet 1 mg (1 mg Oral Given 11/03/17 1529)     Initial Impression / Assessment and Plan / ED Course  I have reviewed the triage vital signs and the nursing notes.  Pertinent labs & imaging results that were available during my care of the patient were reviewed by me and considered in my medical decision making (see chart for details).  Patient presents to the emergency department with suicidal ideations requiring medical clearance.  Patient has had increased anxiety and stress secondary to possible removal of her children from her care.  Patient mildly tachycardic, however tearful and anxious appearing, also EtOH on board. Work-up per triage has been reviewed: Patient has mild hypokalemia at 3.4, otherwise no electrolyte abnormalities.  Nonspecific leukocytosis at 11.8.  No anemia.  Alcohol level is elevated at 119.  Salicylate and acetaminophen levels are within normal limits.  She is not pregnant.  Her i-STAT troponin was negative and she did not have ischemic changes on her EKG to raise concern for ACS, additionally she did not have any chest pain at any point time. Patient shortness of breath is reportedly similar to previous problems with anxiety, she was feeling anxious/upset with it  last evening, states this was not at all similar to her previous pulmonary embolism, her shortness of breath is completely resolved, she was not having chest pain at any point in time, she is not hypoxic,  I doubt recurrence of pulmonary embolism.  Patient resting comfortably in the emergency department.  Patient is medically clear for TTS evaluation.  Disposition per behavioral health.  Findings and plan of care discussed with supervising physician Dr. Anitra Lauth who is in agreement.   Per behavioral health, patient meets inpatient criteria, pending placement.  Final Clinical Impressions(s) / ED Diagnoses   Final diagnoses:  Suicidal ideation    ED Discharge Orders    None       Cherly Anderson, PA-C 11/04/17 0007    Gwyneth Sprout, MD 11/04/17 0010

## 2017-11-03 NOTE — ED Provider Notes (Signed)
MSE was initiated and I personally evaluated the patient and placed orders (if any) at  4:22 PM on November 03, 2017.  The patient appears stable so that the remainder of the MSE may be completed by another provider.  Patient placed in Quick Look pathway, seen and evaluated   Chief Complaint: Suicidal  HPI:   Patient is a 42 year old female with history of schizoaffective disorder and protein C deficiency (on chronic Xarelto) presenting for suicidality.  Patient reports he called EMS today because she thought about taking her life by "taking pills".  Patient reports one prior attempt 6 months ago with cutting.  Patient reports she is been out of her Seroquel and lithium.  Additionally, patient has not taken Xarelto in 10 days.  HI or AVH.  Patient reports life stressors being her children taken away.  Reports feeling extremely anxious, but denies any chest pain, shortness of breath.  Patient reports nausea, which she reports is typical for her when she is anxious.  ROS: See HPI (one)  Physical Exam:   Gen: No distress  Neuro: Awake and Alert  Skin: Warm    Focused Exam: Patient alert and oriented x4.  Normal affect.  Speech is goal-directed and thought content normal.  Patient tearful throughout exam.  Heart regular rate and rhythm.  Lungs clear to auscultation.  Abdomen nontender.   Initiation of care has begun. The patient has been counseled on the process, plan, and necessity for staying for the completion/evaluation, and the remainder of the medical screening examination    Delia ChimesMurray, Brittinie Wherley B, PA-C 11/03/17 1627    Terrilee FilesButler, Michael C, MD 11/03/17 1747

## 2017-11-03 NOTE — ED Triage Notes (Signed)
Pt tearful in triage stating "I don't wanna live" repetitively. She state her children were taken from her and she doesn't want to live anymore.

## 2017-11-03 NOTE — Progress Notes (Signed)
Pt meets inpatient criteria per Fransisca KaufmannLaura Davis, NP. Referral information has been sent to the following hospitals for review: [facility]  [facility]  [facility]  [facility]  [facility]  [facility]  [facility]   CSW will continue to assist with placement needs.   Wells GuilesSarah Fiana Gladu, LCSW, LCAS Disposition CSW [facility] [phone]

## 2017-11-03 NOTE — ED Notes (Signed)
Patient at desk making phone call-Monique,RN  
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
## 2017-11-04 ENCOUNTER — Encounter (HOSPITAL_COMMUNITY): Payer: Self-pay

## 2017-11-04 ENCOUNTER — Inpatient Hospital Stay (HOSPITAL_COMMUNITY)
Admission: AD | Admit: 2017-11-04 | Discharge: 2017-11-10 | DRG: 885 | Disposition: A | Discharge status: Home / Self Care | Payer: Medicaid Other | Source: Intra-hospital | Attending: Psychiatry | Admitting: Psychiatry

## 2017-11-04 ENCOUNTER — Other Ambulatory Visit: Payer: Self-pay

## 2017-11-04 DIAGNOSIS — Z880 Allergy status to penicillin: Secondary | ICD-10-CM

## 2017-11-04 DIAGNOSIS — F419 Anxiety disorder, unspecified: Secondary | ICD-10-CM | POA: Diagnosis present

## 2017-11-04 DIAGNOSIS — Z86711 Personal history of pulmonary embolism: Secondary | ICD-10-CM

## 2017-11-04 DIAGNOSIS — Z811 Family history of alcohol abuse and dependence: Secondary | ICD-10-CM

## 2017-11-04 DIAGNOSIS — Z7901 Long term (current) use of anticoagulants: Secondary | ICD-10-CM | POA: Diagnosis not present

## 2017-11-04 DIAGNOSIS — F4329 Adjustment disorder with other symptoms: Secondary | ICD-10-CM | POA: Diagnosis present

## 2017-11-04 DIAGNOSIS — F10230 Alcohol dependence with withdrawal, uncomplicated: Secondary | ICD-10-CM | POA: Diagnosis not present

## 2017-11-04 DIAGNOSIS — F1123 Opioid dependence with withdrawal: Secondary | ICD-10-CM | POA: Diagnosis not present

## 2017-11-04 DIAGNOSIS — G894 Chronic pain syndrome: Secondary | ICD-10-CM | POA: Diagnosis present

## 2017-11-04 DIAGNOSIS — R45851 Suicidal ideations: Secondary | ICD-10-CM | POA: Diagnosis present

## 2017-11-04 DIAGNOSIS — F1990 Other psychoactive substance use, unspecified, uncomplicated: Secondary | ICD-10-CM | POA: Diagnosis present

## 2017-11-04 DIAGNOSIS — F250 Schizoaffective disorder, bipolar type: Secondary | ICD-10-CM | POA: Diagnosis present

## 2017-11-04 DIAGNOSIS — G4700 Insomnia, unspecified: Secondary | ICD-10-CM | POA: Diagnosis present

## 2017-11-04 DIAGNOSIS — Z915 Personal history of self-harm: Secondary | ICD-10-CM

## 2017-11-04 DIAGNOSIS — A6000 Herpesviral infection of urogenital system, unspecified: Secondary | ICD-10-CM | POA: Diagnosis present

## 2017-11-04 DIAGNOSIS — I10 Essential (primary) hypertension: Secondary | ICD-10-CM | POA: Diagnosis present

## 2017-11-04 MED ORDER — CHLORDIAZEPOXIDE HCL 25 MG PO CAPS
25.0000 mg | ORAL_CAPSULE | Freq: Four times a day (QID) | ORAL | Status: AC
  Administered 2017-11-04 (×3): 25 mg via ORAL
  Filled 2017-11-04 (×3): qty 1

## 2017-11-04 MED ORDER — LOPERAMIDE HCL 2 MG PO CAPS
2.0000 mg | ORAL_CAPSULE | ORAL | Status: AC | PRN
  Administered 2017-11-06: 4 mg via ORAL
  Filled 2017-11-04: qty 2

## 2017-11-04 MED ORDER — THIAMINE HCL 100 MG/ML IJ SOLN
100.0000 mg | Freq: Once | INTRAMUSCULAR | Status: AC
  Administered 2017-11-04: 100 mg via INTRAMUSCULAR
  Filled 2017-11-04: qty 2

## 2017-11-04 MED ORDER — HYDROXYZINE HCL 25 MG PO TABS
25.0000 mg | ORAL_TABLET | Freq: Four times a day (QID) | ORAL | Status: DC | PRN
  Administered 2017-11-04: 25 mg via ORAL
  Filled 2017-11-04: qty 1

## 2017-11-04 MED ORDER — LITHIUM CARBONATE ER 300 MG PO TBCR
600.0000 mg | EXTENDED_RELEASE_TABLET | Freq: Every day | ORAL | Status: DC
  Administered 2017-11-04: 600 mg via ORAL
  Filled 2017-11-04 (×3): qty 2

## 2017-11-04 MED ORDER — LABETALOL HCL 200 MG PO TABS
200.0000 mg | ORAL_TABLET | Freq: Two times a day (BID) | ORAL | Status: DC
  Administered 2017-11-04 – 2017-11-10 (×12): 200 mg via ORAL
  Filled 2017-11-04: qty 2
  Filled 2017-11-04: qty 28
  Filled 2017-11-04 (×4): qty 2
  Filled 2017-11-04: qty 1
  Filled 2017-11-04: qty 28
  Filled 2017-11-04: qty 2
  Filled 2017-11-04 (×2): qty 1
  Filled 2017-11-04: qty 2
  Filled 2017-11-04: qty 1
  Filled 2017-11-04: qty 2
  Filled 2017-11-04 (×2): qty 1
  Filled 2017-11-04 (×3): qty 2

## 2017-11-04 MED ORDER — NICOTINE 21 MG/24HR TD PT24
21.0000 mg | MEDICATED_PATCH | Freq: Every day | TRANSDERMAL | Status: DC
  Filled 2017-11-04 (×7): qty 1

## 2017-11-04 MED ORDER — QUETIAPINE FUMARATE 300 MG PO TABS
600.0000 mg | ORAL_TABLET | Freq: Every day | ORAL | Status: DC
  Administered 2017-11-04: 600 mg via ORAL
  Filled 2017-11-04 (×3): qty 2

## 2017-11-04 MED ORDER — CHLORDIAZEPOXIDE HCL 25 MG PO CAPS: 25.0000 mg | ORAL_CAPSULE | Freq: Four times a day (QID) | ORAL | Status: AC | PRN

## 2017-11-04 MED ORDER — OLANZAPINE 10 MG PO TABS
10.0000 mg | ORAL_TABLET | Freq: Two times a day (BID) | ORAL | Status: DC
  Administered 2017-11-04 – 2017-11-05 (×2): 10 mg via ORAL
  Filled 2017-11-04 (×7): qty 1

## 2017-11-04 MED ORDER — RIVAROXABAN 20 MG PO TABS
20.0000 mg | ORAL_TABLET | Freq: Every day | ORAL | Status: DC
  Administered 2017-11-04 – 2017-11-09 (×6): 20 mg via ORAL
  Filled 2017-11-04: qty 14
  Filled 2017-11-04 (×8): qty 1

## 2017-11-04 MED ORDER — CHLORDIAZEPOXIDE HCL 25 MG PO CAPS
25.0000 mg | ORAL_CAPSULE | ORAL | Status: AC
  Administered 2017-11-06 (×2): 25 mg via ORAL
  Filled 2017-11-04 (×2): qty 1

## 2017-11-04 MED ORDER — VITAMIN B-1 100 MG PO TABS
100.0000 mg | ORAL_TABLET | Freq: Every day | ORAL | Status: DC
  Administered 2017-11-05 – 2017-11-10 (×6): 100 mg via ORAL
  Filled 2017-11-04 (×8): qty 1

## 2017-11-04 MED ORDER — CHLORDIAZEPOXIDE HCL 25 MG PO CAPS
25.0000 mg | ORAL_CAPSULE | Freq: Every day | ORAL | Status: AC
  Administered 2017-11-07: 25 mg via ORAL
  Filled 2017-11-04: qty 1

## 2017-11-04 MED ORDER — ONDANSETRON 4 MG PO TBDP
4.0000 mg | ORAL_TABLET | Freq: Four times a day (QID) | ORAL | Status: AC | PRN
  Administered 2017-11-06: 4 mg via ORAL
  Filled 2017-11-04: qty 1

## 2017-11-04 MED ORDER — ADULT MULTIVITAMIN W/MINERALS CH
1.0000 | ORAL_TABLET | Freq: Every day | ORAL | Status: DC
  Administered 2017-11-04 – 2017-11-10 (×7): 1 via ORAL
  Filled 2017-11-04 (×10): qty 1

## 2017-11-04 MED ORDER — PROPRANOLOL HCL 20 MG PO TABS
20.0000 mg | ORAL_TABLET | Freq: Two times a day (BID) | ORAL | Status: DC
  Administered 2017-11-04 – 2017-11-10 (×12): 20 mg via ORAL
  Filled 2017-11-04 (×6): qty 1
  Filled 2017-11-04: qty 28
  Filled 2017-11-04 (×2): qty 1
  Filled 2017-11-04: qty 28
  Filled 2017-11-04: qty 1
  Filled 2017-11-04: qty 2
  Filled 2017-11-04 (×4): qty 1
  Filled 2017-11-04: qty 2
  Filled 2017-11-04: qty 1

## 2017-11-04 MED ORDER — CHLORDIAZEPOXIDE HCL 25 MG PO CAPS
25.0000 mg | ORAL_CAPSULE | Freq: Three times a day (TID) | ORAL | Status: AC
  Administered 2017-11-05 (×3): 25 mg via ORAL
  Filled 2017-11-04 (×3): qty 1

## 2017-11-04 MED ORDER — LITHIUM CARBONATE ER 300 MG PO TBCR
300.0000 mg | EXTENDED_RELEASE_TABLET | Freq: Every day | ORAL | Status: DC
  Administered 2017-11-05: 300 mg via ORAL
  Filled 2017-11-04 (×3): qty 1

## 2017-11-04 NOTE — ED Notes (Signed)
Lunch tray at bedside. ?

## 2017-11-04 NOTE — Tx Team (Signed)
Initial Treatment Plan 11/04/2017 2:16 PM Megan LawrenceKelly L Edwards ZOX:[id number]    PATIENT STRESSORS: Financial difficulties Marital or family conflict   PATIENT STRENGTHS: Ability for insight General fund of knowledge   PATIENT IDENTIFIED PROBLEMS: "I want to work on my coping skills and staying safe"  "I need to get mentally stable for my children."                   DISCHARGE CRITERIA:  Improved stabilization in mood, thinking, and/or behavior  PRELIMINARY DISCHARGE PLAN: Placement in alternative living arrangements  PATIENT/FAMILY INVOLVEMENT: This treatment plan has been presented to and reviewed with the patient, Megan Edwards, and/or family member,   The patient and family have been given the opportunity to ask questions and make suggestions.  Jerrye BushyLaRonica R Presley Summerlin, RN 11/04/2017, 2:16 PM

## 2017-11-04 NOTE — Progress Notes (Signed)
Nursing Progress Note: 7p-7a D: Pt currently presents with a depressed/anxious affect and behavior. Pt states "I still feel like hurting myself if I wasn't here right now." Interacting appropriately with the milieu. Pt reports good sleep during the previous night with current medication regimen. Pt did not attend wrap-up group.  A: Pt provided with medications per providers orders. Pt's labs and vitals were monitored throughout the night. Pt supported emotionally and encouraged to express concerns and questions. Pt educated on medications.  R: Pt's safety ensured with 15 minute and environmental checks. Pt currently denies HI and AVH and endorses passive SI. Pt verbally contracts to seek staff if SI,HI, or AVH occurs and to consult with staff before acting on any harmful thoughts. Will continue to monitor.

## 2017-11-04 NOTE — Progress Notes (Signed)
Did not attend group 

## 2017-11-04 NOTE — ED Notes (Signed)
Pt using phone at nurse's station

## 2017-11-04 NOTE — ED Notes (Signed)
[facility] called for transport shortly before 1300

## 2017-11-05 ENCOUNTER — Encounter (HOSPITAL_COMMUNITY): Payer: Self-pay

## 2017-11-05 DIAGNOSIS — R45851 Suicidal ideations: Secondary | ICD-10-CM

## 2017-11-05 MED ORDER — ZOLPIDEM TARTRATE 5 MG PO TABS
5.0000 mg | ORAL_TABLET | Freq: Every evening | ORAL | Status: DC | PRN
  Administered 2017-11-05 – 2017-11-08 (×4): 5 mg via ORAL
  Filled 2017-11-05 (×4): qty 1

## 2017-11-05 MED ORDER — HYDROXYZINE HCL 50 MG PO TABS
50.0000 mg | ORAL_TABLET | Freq: Four times a day (QID) | ORAL | Status: DC | PRN
  Administered 2017-11-06 – 2017-11-08 (×7): 50 mg via ORAL
  Filled 2017-11-05 (×6): qty 1
  Filled 2017-11-05: qty 20
  Filled 2017-11-05: qty 1

## 2017-11-05 MED ORDER — METHOCARBAMOL 500 MG PO TABS
500.0000 mg | ORAL_TABLET | Freq: Three times a day (TID) | ORAL | Status: DC | PRN
  Administered 2017-11-06 – 2017-11-09 (×4): 500 mg via ORAL
  Filled 2017-11-05 (×4): qty 1

## 2017-11-05 MED ORDER — QUETIAPINE FUMARATE 50 MG PO TABS
50.0000 mg | ORAL_TABLET | Freq: Two times a day (BID) | ORAL | Status: DC
  Administered 2017-11-06 – 2017-11-10 (×9): 50 mg via ORAL
  Filled 2017-11-05 (×7): qty 1
  Filled 2017-11-05: qty 28
  Filled 2017-11-05 (×5): qty 1
  Filled 2017-11-05: qty 28
  Filled 2017-11-05: qty 1

## 2017-11-05 MED ORDER — LITHIUM CARBONATE ER 300 MG PO TBCR
300.0000 mg | EXTENDED_RELEASE_TABLET | Freq: Two times a day (BID) | ORAL | Status: DC
  Administered 2017-11-05 – 2017-11-10 (×10): 300 mg via ORAL
  Filled 2017-11-05 (×10): qty 1
  Filled 2017-11-05 (×2): qty 28
  Filled 2017-11-05 (×3): qty 1

## 2017-11-05 MED ORDER — NAPROXEN 500 MG PO TABS: 500.0000 mg | ORAL_TABLET | Freq: Two times a day (BID) | ORAL | Status: DC | PRN

## 2017-11-05 MED ORDER — QUETIAPINE FUMARATE 400 MG PO TABS
400.0000 mg | ORAL_TABLET | Freq: Every day | ORAL | Status: DC
  Administered 2017-11-05: 400 mg via ORAL
  Filled 2017-11-05 (×3): qty 1

## 2017-11-05 MED ORDER — DICYCLOMINE HCL 20 MG PO TABS
20.0000 mg | ORAL_TABLET | Freq: Four times a day (QID) | ORAL | Status: DC | PRN
  Administered 2017-11-06 – 2017-11-08 (×5): 20 mg via ORAL
  Filled 2017-11-05 (×5): qty 1

## 2017-11-05 NOTE — Progress Notes (Addendum)
Nursing Progress Note: 7p-7a D: Pt currently presents with a sad/flat/depressed affect and behavior. Pt states "The doc decreased my Seroquel. I don't know how I am supposed to sleep without it." Interacting appropriately with the milieu. Pt reports good sleep during the previous night with current medication regimen. Pt did attend wrap-up group.  A: Pt provided with medications per providers orders. Pt's labs and vitals were monitored throughout the night. Pt supported emotionally and encouraged to express concerns and questions. Pt educated on medications.  R: Pt's safety ensured with 15 minute and environmental checks. Pt currently denies HI and AVH and endorses passive SI. Pt verbally contracts to seek staff if SI,HI, or AVH occurs and to consult with staff before acting on any harmful thoughts. Will continue to monitor.

## 2017-11-05 NOTE — BHH Suicide Risk Assessment (Signed)
[facility]   Nursing information obtained from:  Patient Demographic factors:  Living alone, Divorced or widowed, Caucasian, Unemployed, Access to firearms, Low socioeconomic status Current Mental Status:  Suicidal ideation indicated by patient, Suicide plan Loss Factors:  Loss of significant relationship, Financial problems / change in socioeconomic status Historical Factors:  Prior suicide attempts Risk Reduction Factors:  Responsible for children under 42 years of age, Sense of responsibility to family  Total Time spent with patient: 1 hour Principal Problem: Schizoaffective disorder, bipolar type (HCC) Diagnosis:   Patient Active Problem List   Diagnosis Date Noted  . Schizoaffective disorder, bipolar type (HCC) [F25.0] 03/30/2017  . Adjustment disorder with disturbance of emotion [F43.29] 03/29/2017  . Alcohol abuse with alcohol-induced mood disorder (HCC) [F10.14] 01/18/2017  . Anxiety and depression [F41.9, F32.9] 01/14/2017  . Pulmonary embolism (HCC) [I26.99] 12/19/2016  . Alcohol withdrawal (HCC) [F10.239] 12/01/2016  . ADD (attention deficit disorder) [F98.8] 05/10/2016  . Opioid use disorder, mild, in controlled environment (HCC) [F11.10] 05/06/2016  . Essential hypertension [I10] 05/06/2016  . Alcohol use disorder, moderate, in early remission, dependence (HCC) [F10.21] 05/06/2016  . Bipolar I disorder, most recent episode manic, severe with psychotic features (HCC) [F31.2] 01/20/2016  . Chronic pain syndrome [G89.4]    Subjective Data: See H&P for details  Continued Clinical Symptoms:  Alcohol Use Disorder Identification Test Final Score (AUDIT): 0 The "Alcohol Use Disorders Identification Test", Guidelines for Use in Primary Care, Second Edition.  World Science writerHealth Organization [facility](WHO). Score between 0-7:  no or low risk or alcohol related problems. Score between 8-15:  moderate risk of alcohol related problems. Score between 16-19:  high risk of  alcohol related problems. Score 20 or above:  warrants further diagnostic evaluation for alcohol dependence and treatment.   CLINICAL FACTORS:   Severe Anxiety and/or Agitation Bipolar Disorder:   Mixed State Schizophrenia:   Paranoid or undifferentiated type More than one psychiatric diagnosis Unstable or Poor Therapeutic Relationship  Psychiatric Specialty Exam: Physical Exam  Nursing note and vitals reviewed.   ROS- See H&P for details  Blood pressure (!) 120/93, pulse 84, temperature 98.7 F (37.1 C), temperature source Oral, resp. rate 18, height 5\' 7"  (1.702 m), weight 90.7 kg, SpO2 96 %.Body mass index is 31.32 kg/m.   COGNITIVE FEATURES THAT CONTRIBUTE TO RISK:  None    SUICIDE RISK:   Moderate:  Frequent suicidal ideation with limited intensity, and duration, some specificity in terms of plans, no associated intent, good self-control, limited dysphoria/symptomatology, some risk factors present, and identifiable protective factors, including available and accessible social support.  PLAN OF CARE: See H&P for details  I certify that inpatient services furnished can reasonably be expected to improve the patient's condition.   Micheal Likenshristopher T Joannie Medine, MD 11/05/2017, 5:00 PM

## 2017-11-05 NOTE — Progress Notes (Signed)
Patient denies SI, HI and AVH.  Patient has been engaged in groups, compliant with medications and has had no incidents of behavioral dsycontrol this shift.   Assess patient for safety, offer medications as prescribed, and engage patient in 1:1 staff talk.   Patient able to contract for safety.  Continue to monitor as planned. 
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
## 2017-11-05 NOTE — Progress Notes (Signed)
Adult Psychoeducational Group Note  Date:  11/05/2017 Time:  8:33 PM  Group Topic/Focus:  Wrap-Up Group:   The focus of this group is to help patients review their daily goal of treatment and discuss progress on daily workbooks.  Participation Level:  Active  Participation Quality:  Appropriate  Affect:  Appropriate  Cognitive:  Alert  Insight: Appropriate  Engagement in Group:  Engaged  Modes of Intervention:  Discussion  Additional Comments:  Pt stated that she had a rough day. According to patient, she is not being able to visit or talk with her children. Pt also stated that she feels hopeless and helpless because she can't interact with her children.   Megan HailstoneCOOKE, Megan Edwards 11/05/2017, 8:33 PM

## 2017-11-06 MED ORDER — CLONIDINE HCL 0.1 MG PO TABS
0.1000 mg | ORAL_TABLET | Freq: Every day | ORAL | Status: DC
  Filled 2017-11-06: qty 1

## 2017-11-06 MED ORDER — QUETIAPINE FUMARATE 400 MG PO TABS
500.0000 mg | ORAL_TABLET | Freq: Every day | ORAL | Status: DC
  Administered 2017-11-06 – 2017-11-09 (×4): 500 mg via ORAL
  Filled 2017-11-06 (×7): qty 1

## 2017-11-06 MED ORDER — CLONIDINE HCL 0.1 MG PO TABS
0.1000 mg | ORAL_TABLET | ORAL | Status: AC
  Administered 2017-11-08 – 2017-11-10 (×4): 0.1 mg via ORAL
  Filled 2017-11-06 (×6): qty 1

## 2017-11-06 MED ORDER — CLONIDINE HCL 0.1 MG PO TABS
0.1000 mg | ORAL_TABLET | Freq: Four times a day (QID) | ORAL | Status: AC
  Administered 2017-11-06 – 2017-11-08 (×8): 0.1 mg via ORAL
  Filled 2017-11-06 (×9): qty 1

## 2017-11-06 NOTE — [facility] Suicide Risk Assessment (Signed)
[facility] INPATIENT:  Family/Significant Other Suicide Prevention Education  Suicide Prevention Education:  Patient Refusal for Family/Significant Other Suicide Prevention Education: The patient Megan HalstedKelly L Tarnow has refused to provide written consent for family/significant other to be provided Family/Significant Other Suicide Prevention Education during admission and/or prior to discharge.  Physician notified.  Ida RogueRodney B Adaley Kiene 11/06/2017, 10:10 AM

## 2017-11-06 NOTE — Progress Notes (Signed)
Adult Psychoeducational Group Note  Date:  11/06/2017 Time:  8:51 PM  Group Topic/Focus:  Wrap-Up Group:   The focus of this group is to help patients review their daily goal of treatment and discuss progress on daily workbooks.  Participation Level:  Active  Participation Quality:  Appropriate  Affect:  Appropriate  Cognitive:  Appropriate  Insight: Appropriate  Engagement in Group:  Engaged  Modes of Intervention:  Discussion  Additional Comments:  Patient attended wrap-up group and said that her day was a 3.  Patient expressed that she had been feeling sick the entire day but she was excited that she was able to get placement. She spend the day in her room resting.   [place] Current W Chasten Blaze 11/06/2017, 8:51 PM
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
## 2017-11-06 NOTE — Progress Notes (Signed)
[facility]  11/06/2017 1:08 PM Megan Edwards  MRN:  [id number] Subjective:    Megan Edwards is a 42 yo female with a history of schizoaffective disorder bipolar type (elsewhere: bipolar 1), polysubstance use disorder, and history of suicide attempt who was [facility] [place] for increasing suicidal ideations with a plan to "take all of her pills" in the context of strained relationship with her significant other. Additionally, pt reported poor adherence to her outpatient medication regimen and increasing use of alcohol. Pt was medically cleared and [facility] and [facility]. Pt was restarted on previous home medications of seroquel and lithium. She was started on CIWA with librium and additionally the COWS with clonidine due to taking suboxone as an outpatient (which was stopped due to use of alcohol).  Today upon evaluation, pt shares, "I'm feeling rough." She reports malaise, chills, GI upset, abdominal cramping, and fatigue. She has intermittent diarrhea and constipation. She reports some ongoing depression with mild SI (no plan or intent), and she is able to contract for safety while she is in the hospital. She reports that otherwise she is doing well, and she remains future-oriented about seeking substance use treatment [facility]. She reports that her sleep was somewhat poor last night. She denies HI/AH/VH. We discussed increasing dose of seroquel tonight, and pt was in agreement. She had no further questions, comments, or concerns.   Principal Problem: Schizoaffective disorder, bipolar type (HCC) Diagnosis:   Patient Active Problem List   Diagnosis Date Noted  . Schizoaffective disorder, bipolar type (HCC) [F25.0] 03/30/2017  . Adjustment disorder with disturbance of emotion [F43.29] 03/29/2017  . Alcohol abuse with alcohol-induced mood disorder (HCC) [F10.14] 01/18/2017  .  Anxiety and depression [F41.9, F32.9] 01/14/2017  . Pulmonary embolism (HCC) [I26.99] 12/19/2016  . Alcohol withdrawal (HCC) [F10.239] 12/01/2016  . ADD (attention deficit disorder) [F98.8] 05/10/2016  . Opioid use disorder, mild, in controlled environment (HCC) [F11.10] 05/06/2016  . Essential hypertension [I10] 05/06/2016  . Alcohol use disorder, moderate, in early remission, dependence (HCC) [F10.21] 05/06/2016  . Bipolar I disorder, most recent episode manic, severe with psychotic features (HCC) [F31.2] 01/20/2016  . Chronic pain syndrome [G89.4]    Total Time spent with patient: 30 minutes  Past Psychiatric History: See H&P  Past Medical History:  Past Medical History:  Diagnosis Date  . Abnormal Pap smear   . Alcohol abuse   . Anxiety and depression 01/14/2017  . Bipolar 1 disorder (HCC)   . Depression   . Genital herpes 2000  . Hypertension   . Infection    UTI  . Ovarian cyst   . Schizoaffective disorder (HCC) 05/05/2016  . SVT (supraventricular tachycardia) (HCC)     Past Surgical History:  Procedure Laterality Date  . addenoidectomy    . BREAST ENHANCEMENT SURGERY    . BREAST SURGERY    . KNEE SURGERY    . KNEE SURGERY Left   . TONSILLECTOMY    . WISDOM TOOTH EXTRACTION     Family History:  Family History  Problem Relation Age of Onset  . Alcoholism Unknown   . Leukemia Unknown   . Cervical cancer Unknown   . Bone cancer Unknown   . Cirrhosis Unknown   . Diabetes Mellitus II Unknown   . Depression Unknown   . Hypertension Unknown   . Thyroid cancer Unknown   . Diabetes Mellitus II Mother   .  Hypertension Mother   . Alcoholism Father   . Mental illness Paternal Grandmother    Family Psychiatric  History: see H&P Social History:  Social History   Substance and Sexual Activity  Alcohol Use Not Currently  . Alcohol/week: 0.0 standard drinks   Comment: From older records, patient was in rehab. Butt, denies ETOH use for this visit.      Social  History   Substance and Sexual Activity  Drug Use Not Currently   Comment: From older records, patient was in rehab. But, denies any use.     Social History   Socioeconomic History  . Marital status: Divorced    Spouse name: Not on file  . Number of children: Not on file  . Years of education: Not on file  . Highest education level: Not on file  Occupational History  . 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 Financial resource strain: Not on file  . Food insecurity:    Worry: Not on file    Inability: Not on file  . Transportation needs:    Medical: Not on file    Non-medical: Not on file  Tobacco Use  . Smoking status: Never Smoker  . Smokeless tobacco: Never Used  Substance and Sexual Activity  . Alcohol use: Not Currently    Alcohol/week: 0.0 standard drinks    Comment: From older records, patient was in rehab. Butt, denies ETOH use for this visit.   . Drug use: Not Currently    Comment: From older records, patient was in rehab. But, denies any use.   Marland Kitchen Sexual activity: Not on file  Lifestyle  . Physical activity:    Days per week: Not on file    Minutes per session: Not on file  . Stress: Not on file  Relationships  . Social connections:    Talks on phone: Not on file    Gets together: Not on file    Attends religious service: Not on file    Active member of club or organization: Not on file    Attends meetings of clubs or organizations: Not on file    Relationship status: Not on file  Other Topics Concern  . Not on file  Social History Narrative  . Not on file   Additional Social History:                         Sleep: Poor  Appetite:  Fair  Current Medications: Current Facility-Administered Medications  Medication Dose Route Frequency Provider Last Rate Last Dose  . chlordiazePOXIDE (LIBRIUM) capsule 25 mg  25 mg Oral Q6H PRN Rankin, Shuvon B, NP      . chlordiazePOXIDE (LIBRIUM) capsule 25 mg  25 mg Oral  BH-qamhs Rankin, Shuvon B, NP   25 mg at 11/06/17 0738   Followed by  . [START ON 11/07/2017] chlordiazePOXIDE (LIBRIUM) capsule 25 mg  25 mg Oral Daily Rankin, Shuvon B, NP      . cloNIDine (CATAPRES) tablet 0.1 mg  0.1 mg Oral QID Micheal Likens, MD   0.1 mg at 11/06/17 1033   Followed by  . [START ON 11/08/2017] cloNIDine (CATAPRES) tablet 0.1 mg  0.1 mg Oral BH-qamhs Micheal Likens, MD       Followed by  . [START ON 11/11/2017] cloNIDine (CATAPRES) tablet 0.1 mg  0.1 mg Oral QAC breakfast Micheal Likens, MD      . dicyclomine (BENTYL)  tablet 20 mg  20 mg Oral Q6H PRN Micheal Likensainville, Maire Govan T, MD   20 mg at 11/06/17 0732  . hydrOXYzine (ATARAX/VISTARIL) tablet 50 mg  50 mg Oral Q6H PRN Micheal Likensainville, Shuntavia Yerby T, MD   50 mg at 11/06/17 0732  . labetalol (NORMODYNE) tablet 200 mg  200 mg Oral BID Rankin, Shuvon B, NP   200 mg at 11/06/17 0732  . lithium carbonate (LITHOBID) CR tablet 300 mg  300 mg Oral BID Micheal Likens[place], Khamille Beynon T, MD   300 mg at 11/06/17 0731  . loperamide (IMODIUM) capsule 2-4 mg  2-4 mg Oral PRN Rankin, Shuvon B, NP   4 mg at 11/06/17 0732  . methocarbamol (ROBAXIN) tablet 500 mg  500 mg Oral Q8H PRN Micheal Likens[place], Gilmer Kaminsky T, MD   500 mg at 11/06/17 0732  . multivitamin with minerals tablet 1 tablet  1 tablet Oral Daily Rankin, Shuvon B, NP   1 tablet at 11/06/17 0732  . nicotine (NICODERM CQ - dosed in mg/24 hours) patch 21 mg  21 mg Transdermal Daily Rankin, Shuvon B, NP      . ondansetron (ZOFRAN-ODT) disintegrating tablet 4 mg  4 mg Oral Q6H PRN Rankin, Shuvon B, NP      . propranolol (INDERAL) tablet 20 mg  20 mg Oral BID Rankin, Shuvon B, NP   20 mg at 11/06/17 0731  . QUEtiapine (SEROQUEL) tablet 400 mg  400 mg Oral QHS Micheal Likens[place], Taleia Sadowski T, MD   400 mg at 11/05/17 2100  . QUEtiapine (SEROQUEL) tablet 50 mg  50 mg Oral BID Micheal Likens[place], Spring San T, MD   50 mg at 11/06/17 0731  . rivaroxaban (XARELTO) tablet 20 mg  20 mg Oral Q supper  Micheal Likens[place], Banesa Tristan T, MD   20 mg at 11/05/17 1626  . thiamine (VITAMIN B-1) tablet 100 mg  100 mg Oral Daily Rankin, Shuvon B, NP   100 mg at 11/06/17 0739  . zolpidem (AMBIEN) tablet 5 mg  5 mg Oral QHS PRN Micheal Likens[place], Evangela Heffler T, MD   5 mg at 11/05/17 2100    Lab Results: No results found for this or any previous visit (from the past 48 hour(s)).  Blood Alcohol level:  Lab Results  Component Value Date   ETH 119 (H) 11/03/2017   ETH <10 10/03/2017    Metabolic Disorder Labs: Lab Results  Component Value Date   HGBA1C 5.4 03/31/2017   MPG 108.28 03/31/2017   MPG 108 05/07/2016   Lab Results  Component Value Date   PROLACTIN 86.9 (H) 05/07/2016   PROLACTIN 19.4 05/06/2016   Lab Results  Component Value Date   CHOL 201 (H) 03/31/2017   TRIG 122 03/31/2017   HDL 42 03/31/2017   CHOLHDL 4.8 03/31/2017   VLDL 24 03/31/2017   LDLCALC 135 (H) 03/31/2017   LDLCALC 96 05/06/2016    Physical Findings: AIMS: Facial and Oral Movements Muscles of Facial Expression: None, normal Lips and Perioral Area: None, normal Jaw: None, normal Tongue: None, normal,Extremity Movements Upper (arms, wrists, hands, fingers): None, normal Lower (legs, knees, ankles, toes): None, normal, Trunk Movements Neck, shoulders, hips: None, normal, Overall Severity Severity of abnormal movements (highest score from questions above): None, normal Incapacitation due to abnormal movements: None, normal Patient's awareness of abnormal movements (rate only patient's report): No Awareness, Dental Status Current problems with teeth and/or dentures?: No Does patient usually wear dentures?: No  CIWA:  CIWA-Ar Total: 0 COWS:  COWS Total Score: 0  Musculoskeletal: Strength & Muscle Tone: within  normal limits Gait & Station: normal Patient leans: N/A  Psychiatric Specialty Exam: Physical Exam  Nursing note and vitals reviewed.   Review of Systems  Constitutional: Positive for chills and  malaise/fatigue. Negative for fever.  Respiratory: Negative for cough and shortness of breath.   Cardiovascular: Negative for chest pain.  Gastrointestinal: Positive for abdominal pain, constipation and diarrhea. Negative for heartburn, nausea and vomiting.  Psychiatric/Behavioral: Positive for depression. Negative for hallucinations and suicidal ideas. The patient is nervous/anxious and has insomnia.     Blood pressure 125/85, pulse 88, temperature 98.1 F (36.7 C), temperature source Oral, resp. rate 18, height 5\' 7"  (1.702 m), weight 90.7 kg, SpO2 96 %.Body mass index is 31.32 kg/m.  General Appearance: Casual and Fairly Groomed  Eye Contact:  Good  Speech:  Clear and Coherent and Normal Rate  Volume:  Normal  Mood:  Anxious and Depressed  Affect:  Appropriate, Congruent and Constricted  Thought Process:  Coherent and Goal Directed  Orientation:  Full (Time, Place, and Person)  Thought Content:  Logical  Suicidal Thoughts:  Yes.  without intent/plan  Homicidal Thoughts:  No  Memory:  Immediate;   Fair Recent;   Fair Remote;   Fair  Judgement:  Fair  Insight:  Fair  Psychomotor Activity:  Normal  Concentration:  Concentration: Fair  Recall:  [employer] of Knowledge:  Fair  Language:  Fair  Akathisia:  No  Handed:    AIMS (if indicated):     Assets:  Resilience Social Support  ADL's:  Intact  Cognition:  WNL  Sleep:  Number of Hours: 6.75   Treatment Plan Summary: Daily contact with patient to assess and evaluate symptoms and progress in treatment and Medication management   -Continue inpatient hospitalization  -Schizoaffective disorder, bipolar type   -Change seroquel 400mg  po qhs to seroquel 500mg  po qhs   -Continue seroquel 50mg  po BID (at 0800 and 1400)   -Continue lithium CR 300mg  po BID (lithium level 8/18 AM)  -Alcohol use disorder, severe, in current withdrawal   -Continue CIWA with librium taper  -Opiate withdrawal (pt discontinued from suboxone upon  admission)   -Continue COWS with clonidine taper  -Anxiety    -Continue propranolol 20mg  po BID  -Insomnia   -Continue ambien 5mg  po qhs prn insomnia  -Other health issues:    -Continue xarelto 20mg  po qDay  -Encourage participation in groups and therapeutic milieu  -disposition planning will be ongoing  Micheal Likens, MD 11/06/2017, 1:08 PM

## 2017-11-06 NOTE — Plan of Care (Signed)
Problem: Health Behavior/Discharge Planning: Goal: Compliance with treatment plan for underlying cause of condition will improve Outcome: Progressing   Problem: Safety: Goal: Periods of time without injury will increase Outcome: Progressing D: Pt observed in hall on initial contact. Pt denies SI, HI and AVH "not right now but it's on and off". Verbally contracts for safety. Reports fair sleep, fair appetite, low energy and poor concentration level on self inventory sheet. Restless / fidgety on interactions.  A: Emotional support offered. Encouraged pt to voice concerns and comply with current treatment regimen. Scheduled and PRN (Zofran, Robaxin, Vistaril) for c/o active withdrawal symptoms (nausea, fatigue, medications given as ordered with verbal education and effects monitored. Q 15 minutes safety checks continues without self harm gestures or outburst. R: Pt remains medication compliant, denies adverse drug reactions when assessed. Cooperative with care on and off unit. Reports some relief with her withdrawal symptoms "it helps some". Tolerates all PO intake well. POC maintained for safety and mood stability.

## 2017-11-06 NOTE — [facility] Group Notes (Signed)
LCSW Group Therapy Note  11/06/2017 1:15pm  Type of Therapy and Topic:  Group Therapy:  Feelings around Relapse and Recovery  Participation Level:  Did Not Attend   Description of Group:    Patients in this group will discuss emotions they experience before and after a relapse. They will process how experiencing these feelings, or avoidance of experiencing them, relates to having a relapse. Facilitator will guide patients to explore emotions they have related to recovery. Patients will be encouraged to process which emotions are more powerful. They will be guided to discuss the emotional reaction significant others in their lives may have to their relapse or recovery. Patients will be assisted in exploring ways to respond to the emotions of others without this contributing to a relapse.  Therapeutic Goals: 1. Patient will identify two or more emotions that lead to a relapse for them 2. Patient will identify two emotions that result when they relapse 3. Patient will identify two emotions related to recovery 4. Patient will demonstrate ability to communicate their needs through discussion and/or role plays   Summary of Patient Progress:     Therapeutic Modalities:   Cognitive Behavioral Therapy Solution-Focused Therapy Assertiveness Training Relapse Prevention Therapy   Ida RogueRodney B Remell Giaimo, LCSW 11/06/2017 4:55 PM

## 2017-11-07 NOTE — Plan of Care (Signed)
  Problem: Education: Goal: Knowledge of [facility] Education information/materials will improve Outcome: Progressing Goal: Emotional status will improve Outcome: Progressing Goal: Mental status will improve Outcome: Progressing Goal: Verbalization of understanding the information provided will improve Outcome: Progressing   Problem: Activity: Goal: Interest or engagement in activities will improve Outcome: Progressing Goal: Sleeping patterns will improve Outcome: Progressing   Problem: Coping: Goal: Ability to verbalize frustrations and anger appropriately will improve Outcome: Progressing Goal: Ability to demonstrate self-control will improve Outcome: Progressing   Problem: Health Behavior/Discharge Planning: Goal: Identification of resources available to assist in meeting health care needs will improve Outcome: Progressing Goal: Compliance with treatment plan for underlying cause of condition will improve Outcome: Progressing   Problem: Physical Regulation: Goal: Ability to maintain clinical measurements within normal limits will improve Outcome: Progressing   Problem: Safety: Goal: Periods of time without injury will increase Outcome: Progressing   Problem: Education: Goal: Utilization of techniques to improve thought processes will improve Outcome: Progressing Goal: Knowledge of the prescribed therapeutic regimen will improve Outcome: Progressing   Problem: Activity: Goal: Interest or engagement in leisure activities will improve Outcome: Progressing Goal: Imbalance in normal sleep/wake cycle will improve Outcome: Progressing   Problem: Coping: Goal: Coping ability will improve Outcome: Progressing Goal: Will verbalize feelings Outcome: Progressing   Problem: Health Behavior/Discharge Planning: Goal: Ability to make decisions will improve Outcome: Progressing Goal: Compliance with therapeutic regimen will improve Outcome: Progressing    Problem: Role Relationship: Goal: Will demonstrate positive changes in social behaviors and relationships Outcome: Progressing   Problem: Safety: Goal: Ability to disclose and discuss suicidal ideas will improve Outcome: Progressing Goal: Ability to identify and utilize support systems that promote safety will improve Outcome: Progressing   Problem: Self-Concept: Goal: Will verbalize positive feelings about self Outcome: Progressing Goal: Level of anxiety will decrease Outcome: Progressing   Problem: Education: Goal: Ability to make informed decisions regarding treatment will improve Outcome: Progressing   Problem: Coping: Goal: Coping ability will improve Outcome: Progressing   Problem: Health Behavior/Discharge Planning: Goal: Identification of resources available to assist in meeting health care needs will improve Outcome: Progressing   Problem: Medication: Goal: Compliance with prescribed medication regimen will improve Outcome: Progressing   Problem: Self-Concept: Goal: Ability to disclose and discuss suicidal ideas will improve Outcome: Progressing Goal: Will verbalize positive feelings about self Outcome: Progressing   

## 2017-11-07 NOTE — Progress Notes (Signed)
[facility] MD Progress Note  11/07/2017 11:06 AM Megan Edwards  MRN:  [id number] Subjective:    Megan Edwards is a 42 yo female with a history of schizoaffective disorderbipolar type (elsewhere: bipolar 1), polysubstance usedisorder, and history of suicide attempt whowas admitted voluntarily fromthe [facility] for increasing suicidal ideations with a plan to "take all of her pills" in the contextofstrained relationship with her significant other. Additionally, pt reported poor adherence to her outpatient medication regimen and increasing use of alcohol.Pt was medically cleared and transferred to [facility] for additional treatment and stabilization. Pt was restarted on previous home medications of seroquel and lithium. She was started on CIWA with librium and additionally the COWS with clonidine due to taking suboxone as an outpatient (which was stopped due to use of alcohol).  Today upon evaluation, pt shares, "I'm still feeling rough." She reports ongoing physical complaints of malaise, chills, GI upset, abdominal cramping, and fatigue. She has some ongoing intermittent diarrhea as well. She reports that her physical symptoms of opiate withdrawal are worse today as compared to yesterday, but she thinks that clonidine has been helpful.  She reports depression has continued today, but she denies SI/HI/AH/VH. She is in agreement to continue to her current medications without changes. She will plan for likely discharge to [facility] for substance use treatment on AM of 11/10/17. She had no further questions, comments, or concerns.   Principal Problem: Schizoaffective disorder, bipolar type (HCC) Diagnosis:   Patient Active Problem List   Diagnosis Date Noted  . Schizoaffective disorder, bipolar type (HCC) [F25.0] 03/30/2017  . Adjustment disorder with disturbance of emotion [F43.29] 03/29/2017  . Alcohol abuse with alcohol-induced mood disorder (HCC) [F10.14] 01/18/2017  . Anxiety and depression [F41.9,  F32.9] 01/14/2017  . Pulmonary embolism (HCC) [I26.99] 12/19/2016  . Alcohol withdrawal (HCC) [F10.239] 12/01/2016  . ADD (attention deficit disorder) [F98.8] 05/10/2016  . Opioid use disorder, mild, in controlled environment (HCC) [F11.10] 05/06/2016  . Essential hypertension [I10] 05/06/2016  . Alcohol use disorder, moderate, in early remission, dependence (HCC) [F10.21] 05/06/2016  . Bipolar I disorder, most recent episode manic, severe with psychotic features (HCC) [F31.2] 01/20/2016  . Chronic pain syndrome [G89.4]    Total Time spent with patient: 30 minutes  Past Psychiatric History: see H&P  Past Medical History:  Past Medical History:  Diagnosis Date  . Abnormal Pap smear   . Alcohol abuse   . Anxiety and depression 01/14/2017  . Bipolar 1 disorder (HCC)   . Depression   . Genital herpes 2000  . Hypertension   . Infection    UTI  . Ovarian cyst   . Schizoaffective disorder (HCC) 05/05/2016  . SVT (supraventricular tachycardia) (HCC)     Past Surgical History:  Procedure Laterality Date  . addenoidectomy    . BREAST ENHANCEMENT SURGERY    . BREAST SURGERY    . KNEE SURGERY    . KNEE SURGERY Left   . TONSILLECTOMY    . WISDOM TOOTH EXTRACTION     Family History:  Family History  Problem Relation Age of Onset  . Alcoholism Unknown   . Leukemia Unknown   . Cervical cancer Unknown   . Bone cancer Unknown   . Cirrhosis Unknown   . Diabetes Mellitus II Unknown   . Depression Unknown   . Hypertension Unknown   . Thyroid cancer Unknown   . Diabetes Mellitus II Mother   . Hypertension Mother   . Alcoholism Father   . Mental illness  Paternal Grandmother    Family Psychiatric  History: see H&P Social History:  Social History   Substance and Sexual Activity  Alcohol Use Not Currently  . Alcohol/week: 0.0 standard drinks   Comment: From older records, patient was in rehab. Butt, denies ETOH use for this visit.      Social History   Substance and Sexual  Activity  Drug Use Not Currently   Comment: From older records, patient was in rehab. But, denies any use.     Social History   Socioeconomic History  . Marital status: Divorced    Spouse name: Not on file  . Number of children: Not on file  . Years of education: Not on file  . Highest education level: Not on file  Occupational History  . Occupation: Control and instrumentation engineererver    Employer: [employer]  Social Needs  . Financial resource strain: Not on file  . Food insecurity:    Worry: Not on file    Inability: Not on file  . Transportation needs:    Medical: Not on file    Non-medical: Not on file  Tobacco Use  . Smoking status: Never Smoker  . Smokeless tobacco: Never Used  Substance and Sexual Activity  . Alcohol use: Not Currently    Alcohol/week: 0.0 standard drinks    Comment: From older records, patient was in rehab. Butt, denies ETOH use for this visit.   . Drug use: Not Currently    Comment: From older records, patient was in rehab. But, denies any use.   Marland Kitchen. Sexual activity: Not on file  Lifestyle  . Physical activity:    Days per week: Not on file    Minutes per session: Not on file  . Stress: Not on file  Relationships  . Social connections:    Talks on phone: Not on file    Gets together: Not on file    Attends religious service: Not on file    Active member of club or organization: Not on file    Attends meetings of clubs or organizations: Not on file    Relationship status: Not on file  Other Topics Concern  . Not on file  Social History Narrative  . Not on file   Additional Social History:                         Sleep: Poor  Appetite:  Fair  Current Medications: Current Facility-Administered Medications  Medication Dose Route Frequency Provider Last Rate Last Dose  . chlordiazePOXIDE (LIBRIUM) capsule 25 mg  25 mg Oral Q6H PRN Rankin, Shuvon B, NP      . cloNIDine (CATAPRES) tablet 0.1 mg  0.1 mg Oral QID Micheal Likensainville, Alee Katen T, MD   0.1 mg at  11/07/17 0755   Followed by  . [START ON 11/08/2017] cloNIDine (CATAPRES) tablet 0.1 mg  0.1 mg Oral BH-qamhs Micheal Likensainville, Forrest Jaroszewski T, MD       Followed by  . [START ON 11/11/2017] cloNIDine (CATAPRES) tablet 0.1 mg  0.1 mg Oral QAC breakfast Riham Polyakov T, MD      . dicyclomine (BENTYL) tablet 20 mg  20 mg Oral Q6H PRN Micheal Likensainville, Edessa Jakubowicz T, MD   20 mg at 11/06/17 1502  . hydrOXYzine (ATARAX/VISTARIL) tablet 50 mg  50 mg Oral Q6H PRN Micheal Likens[place]ville, Leeyah Heather T, MD   50 mg at 11/07/17 1005  . labetalol (NORMODYNE) tablet 200 mg  200 mg Oral BID Rankin, Shuvon B, NP  200 mg at 11/07/17 0755  . lithium carbonate (LITHOBID) CR tablet 300 mg  300 mg Oral BID Micheal Likens, MD   300 mg at 11/07/17 0755  . loperamide (IMODIUM) capsule 2-4 mg  2-4 mg Oral PRN Rankin, Shuvon B, NP   4 mg at 11/06/17 0732  . methocarbamol (ROBAXIN) tablet 500 mg  500 mg Oral Q8H PRN Micheal Likens, MD   500 mg at 11/06/17 1502  . multivitamin with minerals tablet 1 tablet  1 tablet Oral Daily Rankin, Shuvon B, NP   1 tablet at 11/07/17 0755  . nicotine (NICODERM CQ - dosed in mg/24 hours) patch 21 mg  21 mg Transdermal Daily Rankin, Shuvon B, NP      . ondansetron (ZOFRAN-ODT) disintegrating tablet 4 mg  4 mg Oral Q6H PRN Rankin, Shuvon B, NP   4 mg at 11/06/17 1706  . propranolol (INDERAL) tablet 20 mg  20 mg Oral BID Rankin, Shuvon B, NP   20 mg at 11/07/17 0755  . QUEtiapine (SEROQUEL) tablet 50 mg  50 mg Oral BID Micheal Likens, MD   50 mg at 11/07/17 0755  . QUEtiapine (SEROQUEL) tablet 500 mg  500 mg Oral QHS Micheal Likens, MD   500 mg at 11/06/17 2116  . rivaroxaban (XARELTO) tablet 20 mg  20 mg Oral Q supper Micheal Likens, MD   20 mg at 11/06/17 1704  . thiamine (VITAMIN B-1) tablet 100 mg  100 mg Oral Daily Rankin, Shuvon B, NP   100 mg at 11/07/17 0755  . zolpidem (AMBIEN) tablet 5 mg  5 mg Oral QHS PRN Micheal Likens, MD   5 mg at  11/06/17 2108    Lab Results: No results found for this or any previous visit (from the past 48 hour(s)).  Blood Alcohol level:  Lab Results  Component Value Date   ETH 119 (H) 11/03/2017   ETH <10 10/03/2017    Metabolic Disorder Labs: Lab Results  Component Value Date   HGBA1C 5.4 03/31/2017   MPG 108.28 03/31/2017   MPG 108 05/07/2016   Lab Results  Component Value Date   PROLACTIN 86.9 (H) 05/07/2016   PROLACTIN 19.4 05/06/2016   Lab Results  Component Value Date   CHOL 201 (H) 03/31/2017   TRIG 122 03/31/2017   HDL 42 03/31/2017   CHOLHDL 4.8 03/31/2017   VLDL 24 03/31/2017   LDLCALC 135 (H) 03/31/2017   LDLCALC 96 05/06/2016    Physical Findings: AIMS: Facial and Oral Movements Muscles of Facial Expression: None, normal Lips and Perioral Area: None, normal Jaw: None, normal Tongue: None, normal,Extremity Movements Upper (arms, wrists, hands, fingers): None, normal Lower (legs, knees, ankles, toes): None, normal, Trunk Movements Neck, shoulders, hips: None, normal, Overall Severity Severity of abnormal movements (highest score from questions above): None, normal Incapacitation due to abnormal movements: None, normal Patient's awareness of abnormal movements (rate only patient's report): No Awareness, Dental Status Current problems with teeth and/or dentures?: No Does patient usually wear dentures?: No  CIWA:  CIWA-Ar Total: 2 COWS:  COWS Total Score: 0  Musculoskeletal: Strength & Muscle Tone: within normal limits Gait & Station: normal Patient leans: N/A  Psychiatric Specialty Exam: Physical Exam  Nursing note and vitals reviewed.   Review of Systems  Constitutional: Positive for chills. Negative for fever.  Respiratory: Negative for cough and shortness of breath.   Cardiovascular: Negative for chest pain.  Gastrointestinal: Positive for abdominal pain and diarrhea. Negative  for heartburn, nausea and vomiting.  Psychiatric/Behavioral: Positive  for depression. Negative for hallucinations and suicidal ideas. The patient is nervous/anxious. The patient does not have insomnia.     Blood pressure 115/83, pulse 64, temperature 98.1 F (36.7 C), temperature source Oral, resp. rate 18, height 5\' 7"  (1.702 m), weight 90.7 kg, SpO2 96 %.Body mass index is 31.32 kg/m.  General Appearance: Casual and Fairly Groomed  Eye Contact:  Good  Speech:  Clear and Coherent and Pressured  Volume:  Normal  Mood:  Anxious and Depressed  Affect:  Appropriate and Congruent  Thought Process:  Coherent and Goal Directed  Orientation:  Full (Time, Place, and Person)  Thought Content:  Logical  Suicidal Thoughts:  No  Homicidal Thoughts:  No  Memory:  Immediate;   Fair Recent;   Fair Remote;   Fair  Judgement:  Fair  Insight:  Fair  Psychomotor Activity:  Normal  Concentration:  Concentration: Fair  Recall:  Good  Fund of Knowledge:  Good  Language:  Good  Akathisia:  No  Handed:    AIMS (if indicated):     Assets:  Resilience Social Support  ADL's:  Intact  Cognition:  WNL  Sleep:  Number of Hours: 5.75   Treatment Plan Summary: Daily contact with patient to assess and evaluate symptoms and progress in treatment and Medication management   -Continue inpatient hospitalization ([facility] AM of [facility])  -Schizoaffective disorder, bipolar type             -Continue seroquel 500mg  po qhs             -Continue seroquel 50mg  po BID (at 0800 and 1400)             -Continue lithium CR 300mg  po BID (lithium level 8/18 AM)  -Alcohol use disorder, severe, in current withdrawal             -Continue CIWA with librium taper  -Opiate withdrawal (pt discontinued from suboxone upon admission)              -Continue COWS with clonidine taper  -Anxiety                        -Continue propranolol 20mg  po BID  -Insomnia             -Continue ambien 5mg  po qhs prn insomnia  -Anticoagulation              -Continue xarelto 20mg  po qDay  -Encourage participation in groups and therapeutic milieu  -disposition planning will be ongoing  Micheal Likenshristopher T Millie Shorb, MD 11/07/2017, 11:06 AM

## 2017-11-07 NOTE — Progress Notes (Addendum)
Nursing Progress Note: 7p-7a D: Pt currently presents with a sad/flat/depressed/brightens on approach affect and behavior. Pt states "The doc decreased my Seroquel. I don't know how I am supposed to sleep without it." Interacting appropriately with the milieu. Pt reports good sleep during the previous night with current medication regimen. Pt did attend wrap-up group.  A: Pt provided with medications per providers orders. Pt's labs and vitals were monitored throughout the night. Pt supported emotionally and encouraged to express concerns and questions. Pt educated on medications.  R: Pt's safety ensured with 15 minute and environmental checks. Pt currently denies SI HI and AVH. Pt verbally contracts to seek staff if SI,HI, or AVH occurs and to consult with staff before acting on any harmful thoughts. Will continue to monitor.

## 2017-11-07 NOTE — Progress Notes (Signed)
DAR NOTE: Patient presents with anxious affect and depressed mood.  Pt has been ibn the room most of the am, pt stated she is not been feeling well, increased anxiety and generalized weakness. Pt encouraged to stay up in the day room, but stated she is feeling weak. Denies pain, auditory and visual hallucinations. Reports fair sleep, fair appetite, low energy and poor concentration. Rates depression at 0, hopelessness at 2, and anxiety at 8.  Maintained on routine safety checks.  Medications given as prescribed.  Support and encouragement offered as needed.  Will continue to monitor.

## 2017-11-07 NOTE — [facility] Group Notes (Signed)
[facility] LCSW Group Therapy  11/07/2017  1:05 PM  Type of Therapy:  Group therapy  Participation Level:  Did not attend  Participation Quality:  Attentive  Affect:  Flat  Cognitive:  Oriented  Insight:  Limited  Engagement in Therapy:  Limited  Modes of Intervention:  Discussion, Socialization  Summary of Progress/Problems:  Chaplain was here to lead a group on themes of hope and courage.  [name], Caasi Giglia B 11/07/2017 1:44 PM

## 2017-11-08 DIAGNOSIS — F250 Schizoaffective disorder, bipolar type: Principal | ICD-10-CM

## 2017-11-08 NOTE — [facility] Group Notes (Signed)
[facility] Group Notes:  (Nursing)  Date:  11/08/2017  Time:  1:30 PM Type of Therapy:  Nurse Education  Participation Level:  Did Not Attend   Shela NevinValerie S Harvir Patry 11/08/2017, 1:44 PM

## 2017-11-08 NOTE — Progress Notes (Signed)
Writer spoke with patient 1:1 and she reported that her day had been good. She had been in bed asleep until she came to the dayroom for snacks and interacted with her roommate and other peers in the dayroom. She reported that today was a rough day for her. She reported that [facility]. She was compliant with her scheduled medications. Support given and safety maintained on unit with 15 min checks.

## 2017-11-08 NOTE — Progress Notes (Signed)
[facility]  11/08/2017 10:37 AM Megan Edwards  MRN:  [id number]   Subjective: Patient reports today that today is been the worst day for her withdrawals.  She is requesting some medications to help her.  Medications reviewed and patient is informed that she is on that the correct medications with tapering doses and patient request to have an Ativan.  This request was denied.  She states that she is had disrupted sleep due to her withdrawals but that she has had a very good appetite.  She denies any SI/HI/AVH and she contracts for safety while on the unit.  Patient continues to state that she just wishes she had more medications that she could take.  Objective: Patient's chart and findings reviewed and discussed with treatment team.  Patient presents in the day room watching TV and she is pleasant and cooperative.  Patient is kind of demanding on saving more medications.  Patient is asking for her Seroquel to be changed around to get 200 mg throughout the day with 400 mg at night, patient is requesting to have another medication to help with withdrawals, and patient has requested an Ativan all of which were denied.  Patient was educated and informed on withdrawal symptoms.  Patient stated agreement and understood and stated that she will be okay and hopes that the withdrawals will be over soon.  Principal Problem: Schizoaffective disorder, bipolar type (HCC) Diagnosis:   Patient Active Problem List   Diagnosis Date Noted  . Schizoaffective disorder, bipolar type (HCC) [F25.0] 03/30/2017  . Adjustment disorder with disturbance of emotion [F43.29] 03/29/2017  . Alcohol abuse with alcohol-induced mood disorder (HCC) [F10.14] 01/18/2017  . Anxiety and depression [F41.9, F32.9] 01/14/2017  . Pulmonary embolism (HCC) [I26.99] 12/19/2016  . Alcohol withdrawal (HCC) [F10.239] 12/01/2016  . ADD (attention deficit disorder) [F98.8] 05/10/2016  . Opioid use disorder, mild, in controlled  environment (HCC) [F11.10] 05/06/2016  . Essential hypertension [I10] 05/06/2016  . Alcohol use disorder, moderate, in early remission, dependence (HCC) [F10.21] 05/06/2016  . Bipolar I disorder, most recent episode manic, severe with psychotic features (HCC) [F31.2] 01/20/2016  . Chronic pain syndrome [G89.4]    Total Time spent with patient: 20 minutes  Past Psychiatric History: See H&P  Past Medical History:  Past Medical History:  Diagnosis Date  . Abnormal Pap smear   . Alcohol abuse   . Anxiety and depression 01/14/2017  . Bipolar 1 disorder (HCC)   . Depression   . Genital herpes 2000  . Hypertension   . Infection    UTI  . Ovarian cyst   . Schizoaffective disorder (HCC) 05/05/2016  . SVT (supraventricular tachycardia) (HCC)     Past Surgical History:  Procedure Laterality Date  . addenoidectomy    . BREAST ENHANCEMENT SURGERY    . BREAST SURGERY    . KNEE SURGERY    . KNEE SURGERY Left   . TONSILLECTOMY    . WISDOM TOOTH EXTRACTION     Family History:  Family History  Problem Relation Age of Onset  . Alcoholism Unknown   . Leukemia Unknown   . Cervical cancer Unknown   . Bone cancer Unknown   . Cirrhosis Unknown   . Diabetes Mellitus II Unknown   . Depression Unknown   . Hypertension Unknown   . Thyroid cancer Unknown   . Diabetes Mellitus II Mother   . Hypertension Mother   . Alcoholism Father   . Mental illness Paternal Grandmother  Family Psychiatric  History: See H&P Social History:  Social History   Substance and Sexual Activity  Alcohol Use Not Currently  . Alcohol/week: 0.0 standard drinks   Comment: From older records, patient was in rehab. Butt, denies ETOH use for this visit.      Social History   Substance and Sexual Activity  Drug Use Not Currently   Comment: From older records, patient was in rehab. But, denies any use.     Social History   Socioeconomic History  . Marital status: Divorced    Spouse name: Not on file  .  Number of children: Not on file  . Years of education: Not on file  . Highest education level: Not on file  Occupational History  . 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 Financial resource strain: Not on file  . Food insecurity:    Worry: Not on file    Inability: Not on file  . Transportation needs:    Medical: Not on file    Non-medical: Not on file  Tobacco Use  . Smoking status: Never Smoker  . Smokeless tobacco: Never Used  Substance and Sexual Activity  . Alcohol use: Not Currently    Alcohol/week: 0.0 standard drinks    Comment: From older records, patient was in rehab. Butt, denies ETOH use for this visit.   . Drug use: Not Currently    Comment: From older records, patient was in rehab. But, denies any use.   Marland Kitchen Sexual activity: Not on file  Lifestyle  . Physical activity:    Days per week: Not on file    Minutes per session: Not on file  . Stress: Not on file  Relationships  . Social connections:    Talks on phone: Not on file    Gets together: Not on file    Attends religious service: Not on file    Active member of club or organization: Not on file    Attends meetings of clubs or organizations: Not on file    Relationship status: Not on file  Other Topics Concern  . Not on file  Social History Narrative  . Not on file   Additional Social History:                         Sleep: Fair  Appetite:  Good  Current Medications: Current Facility-Administered Medications  Medication Dose Route Frequency Provider Last Rate Last Dose  . cloNIDine (CATAPRES) tablet 0.1 mg  0.1 mg Oral BH-qamhs Micheal Likens, MD       Followed by  . [START ON 11/11/2017] cloNIDine (CATAPRES) tablet 0.1 mg  0.1 mg Oral QAC breakfast Jolyne Loa T, MD      . dicyclomine (BENTYL) tablet 20 mg  20 mg Oral Q6H PRN Micheal Likens, MD   20 mg at 11/08/17 1610  . hydrOXYzine (ATARAX/VISTARIL) tablet 50 mg  50 mg Oral Q6H  PRN Micheal Likens, MD   50 mg at 11/08/17 0620  . labetalol (NORMODYNE) tablet 200 mg  200 mg Oral BID Rankin, Shuvon B, NP   200 mg at 11/08/17 0750  . lithium carbonate (LITHOBID) CR tablet 300 mg  300 mg Oral BID Micheal Likens, MD   300 mg at 11/08/17 0750  . methocarbamol (ROBAXIN) tablet 500 mg  500 mg Oral Q8H PRN Micheal Likens, MD   500 mg at 11/08/17 0620  .  multivitamin with minerals tablet 1 tablet  1 tablet Oral Daily Rankin, Shuvon B, NP   1 tablet at 11/08/17 0749  . nicotine (NICODERM CQ - dosed in mg/24 hours) patch 21 mg  21 mg Transdermal Daily Rankin, Shuvon B, NP      . propranolol (INDERAL) tablet 20 mg  20 mg Oral BID Rankin, Shuvon B, NP   20 mg at 11/08/17 0750  . QUEtiapine (SEROQUEL) tablet 50 mg  50 mg Oral BID Micheal Likensainville, Christopher T, MD   50 mg at 11/08/17 0750  . QUEtiapine (SEROQUEL) tablet 500 mg  500 mg Oral QHS Micheal Likensainville, Christopher T, MD   500 mg at 11/07/17 2121  . rivaroxaban (XARELTO) tablet 20 mg  20 mg Oral Q supper Micheal Likens[place]ville, Christopher T, MD   20 mg at 11/07/17 1644  . thiamine (VITAMIN B-1) tablet 100 mg  100 mg Oral Daily Rankin, Shuvon B, NP   100 mg at 11/08/17 0749  . zolpidem (AMBIEN) tablet 5 mg  5 mg Oral QHS PRN Micheal Likensainville, Christopher T, MD   5 mg at 11/07/17 2121    Lab Results: No results found for this or any previous visit (from the past 48 hour(s)).  Blood Alcohol level:  Lab Results  Component Value Date   ETH 119 (H) 11/03/2017   ETH <10 10/03/2017    Metabolic Disorder Labs: Lab Results  Component Value Date   HGBA1C 5.4 03/31/2017   MPG 108.28 03/31/2017   MPG 108 05/07/2016   Lab Results  Component Value Date   PROLACTIN 86.9 (H) 05/07/2016   PROLACTIN 19.4 05/06/2016   Lab Results  Component Value Date   CHOL 201 (H) 03/31/2017   TRIG 122 03/31/2017   HDL 42 03/31/2017   CHOLHDL 4.8 03/31/2017   VLDL 24 03/31/2017   LDLCALC 135 (H) 03/31/2017   LDLCALC 96 05/06/2016     Physical Findings: AIMS: Facial and Oral Movements Muscles of Facial Expression: None, normal Lips and Perioral Area: None, normal Jaw: None, normal Tongue: None, normal,Extremity Movements Upper (arms, wrists, hands, fingers): None, normal Lower (legs, knees, ankles, toes): None, normal, Trunk Movements Neck, shoulders, hips: None, normal, Overall Severity Severity of abnormal movements (highest score from questions above): None, normal Incapacitation due to abnormal movements: None, normal Patient's awareness of abnormal movements (rate only patient's report): No Awareness, Dental Status Current problems with teeth and/or dentures?: No Does patient usually wear dentures?: No  CIWA:  CIWA-Ar Total: 4 COWS:  COWS Total Score: 2  Musculoskeletal: Strength & Muscle Tone: within normal limits Gait & Station: normal Patient leans: N/A  Psychiatric Specialty Exam: Physical Exam  ROS  Blood pressure 106/75, pulse 76, temperature 98.6 F (37 C), temperature source Oral, resp. rate 20, height 5\' 7"  (1.702 m), weight 90.7 kg, SpO2 96 %.Body mass index is 31.32 kg/m.  General Appearance: Casual and Disheveled  Eye Contact:  Good  Speech:  Clear and Coherent and Normal Rate  Volume:  Normal  Mood:  Depressed  Affect:  Flat  Thought Process:  Goal Directed and Descriptions of Associations: Intact  Orientation:  Full (Time, Place, and Person)  Thought Content:  WDL  Suicidal Thoughts:  No  Homicidal Thoughts:  No  Memory:  Immediate;   Fair Recent;   Fair Remote;   Fair  Judgement:  Fair  Insight:  Fair  Psychomotor Activity:  Normal  Concentration:  Concentration: Good and Attention Span: Good  Recall:  Good  Fund of Knowledge:  Good  Language:  Good  Akathisia:  No  Handed:  Right  AIMS (if indicated):     Assets:  Communication Skills Desire for Improvement Physical Health Social Support  ADL's:  Intact  Cognition:  WNL  Sleep:  Number of Hours: 6.75   Problems  addressed Schizoaffective disorder bipolar type  Treatment Plan Summary: Daily contact with patient to assess and evaluate symptoms and progress in treatment, Medication management and Plan is to: -Continue clonidine detox protocol -Continue Vistaril 50 mg p.o. every 6 hours as needed for anxiety -Continue Lithobid 300 mg p.o. twice daily for mood stability -Continue Seroquel 500 mg p.o. nightly for mood stability -Continue Seroquel 50 mg p.o. twice daily for mood stability -Continue propanolol 20 mg p.o. twice daily -Continue Ambien to 5 mg p.o. nightly as needed for insomnia -Encourage group therapy participation  Maryfrances Bunnell, FNP 11/08/2017, 10:37 AM

## 2017-11-08 NOTE — Progress Notes (Addendum)
D. Pt presents with an anxious affect congruent with mood- friendly upon interactions- observed interacting well with peers in the dayroom. Pt complaining of withdrawal symptoms throughout the day- temporarily relieved by prn medications. Per pt's self inventory, pt rates her depression, hopelessness and anxiety a 5/5/7, respectively.  Pt currently denies SI/HI and AVH and agrees to contact staff before acting on any harmful thoughts.  A. Labs and vitals monitored. Pt compliant with medications. Pt supported emotionally and encouraged to express concerns and ask questions.   R. Pt remains safe with 15 minute checks. Will continue POC.

## 2017-11-08 NOTE — [facility] Group Notes (Signed)
LCSW Group Therapy Note  11/08/2017      Type of Therapy and Topic:  Group Therapy: Anger and Coping Skills  Participation Level:  Active   Description of Group:   In this group, patients learned how to recognize the physical, cognitive, emotional, and behavioral responses they have to anger-provoking situations.  They identified how they usually or often react when angered, and learned how healthy and unhealthy coping skills work initially, but the unhealthy ones stop working.   They analyzed how their frequently-chosen coping skill is possibly beneficial and how it is possibly unhelpful.  The group discussed a variety of healthier coping skills that could help in resolving the actual issues, as well as how to go about planning for the the possibility of future similar situations.  Therapeutic Goals: 1. Patients will identify one thing that makes them angry and how they feel emotionally and physically, what their thoughts are or tend to be in those situations, and what healthy or unhealthy coping mechanism they typically use 2. Patients will identify how their coping technique works for them, as well as how it works against them. 3. Patients will explore possible new behaviors to use in future anger situations. 4. Patients will learn that anger itself is normal and cannot be eliminated, and that healthier coping skills can assist with resolving conflict rather than worsening situations.  Summary of Patient Progress:  The patient shared that she was "angry" 4-5 days ago when some people she is staying with gave her a problem and she initially was quiet and withdrawn for 2 days, and then "blasted them."  This led to the hospitalization and she realizes that holding in her anger was not helpful.  Therapeutic Modalities:   Cognitive Behavioral Therapy Motivation Interviewing  Lynnell ChadMareida J Grossman-Orr  .

## 2017-11-08 NOTE — Progress Notes (Signed)
Nursing note 7p-7a  Pt observed interacting with peers on unit this shift. Displayed a flat affect with and anxious but pleasant mood upon interaction with this Clinical research associatewriter. Pt denies pain ,denies SI/HI, and also denies any audio or visual hallucinations at this time. Pt complains of anxiety and insomnia. Prn Vistaril and Ambien given per MD order see MAR for prn medication administration. Pt is able to verbally contract for safety with this RN. Pt is now resting in bed with eyes closed, with no signs or symptoms of pain or distress noted. Pt continues to remain safe on the unit and is observed by rounding every 15 min. RN will continue to monitor.

## 2017-11-09 LAB — LITHIUM LEVEL: LITHIUM LVL: 0.3 mmol/L — AB (ref 0.60–1.20)

## 2017-11-09 MED ORDER — GABAPENTIN 300 MG PO CAPS
300.0000 mg | ORAL_CAPSULE | Freq: Three times a day (TID) | ORAL | Status: DC
  Administered 2017-11-09 – 2017-11-10 (×3): 300 mg via ORAL
  Filled 2017-11-09 (×3): qty 1
  Filled 2017-11-09: qty 42
  Filled 2017-11-09 (×3): qty 1
  Filled 2017-11-09 (×2): qty 42

## 2017-11-09 MED ORDER — LABETALOL HCL 200 MG PO TABS: 200.0000 mg | ORAL_TABLET | Freq: Two times a day (BID) | ORAL | 0 refills | Status: AC

## 2017-11-09 MED ORDER — ZOLPIDEM TARTRATE 5 MG PO TABS
10.0000 mg | ORAL_TABLET | Freq: Every evening | ORAL | Status: DC | PRN
  Administered 2017-11-09: 10 mg via ORAL
  Filled 2017-11-09: qty 2

## 2017-11-09 MED ORDER — QUETIAPINE FUMARATE 300 MG PO TABS: 600.0000 mg | ORAL_TABLET | Freq: Every day | ORAL | 0 refills | Status: AC

## 2017-11-09 MED ORDER — QUETIAPINE FUMARATE 200 MG PO TABS
500.0000 mg | ORAL_TABLET | Freq: Every day | ORAL | Status: DC
  Filled 2017-11-09: qty 35

## 2017-11-09 MED ORDER — RIVAROXABAN 20 MG PO TABS: 20.0000 mg | ORAL_TABLET | Freq: Every day | ORAL | 0 refills | Status: AC

## 2017-11-09 MED ORDER — POLYETHYLENE GLYCOL 3350 17 G PO PACK
17.0000 g | PACK | Freq: Every day | ORAL | Status: DC
  Filled 2017-11-09 (×3): qty 1

## 2017-11-09 MED ORDER — PAROXETINE HCL 10 MG PO TABS: 10.0000 mg | ORAL_TABLET | Freq: Every day | ORAL | 0 refills | Status: AC

## 2017-11-09 MED ORDER — HYDROXYZINE HCL 50 MG PO TABS: 50.0000 mg | ORAL_TABLET | Freq: Four times a day (QID) | ORAL | 0 refills | Status: AC | PRN

## 2017-11-09 MED ORDER — QUETIAPINE FUMARATE 300 MG PO TABS
600.0000 mg | ORAL_TABLET | Freq: Every day | ORAL | Status: DC
  Filled 2017-11-09: qty 28

## 2017-11-09 MED ORDER — GABAPENTIN 300 MG PO CAPS: 300.0000 mg | ORAL_CAPSULE | Freq: Three times a day (TID) | ORAL | 0 refills | Status: AC

## 2017-11-09 MED ORDER — LITHIUM CARBONATE ER 300 MG PO TBCR: 300.0000 mg | EXTENDED_RELEASE_TABLET | Freq: Two times a day (BID) | ORAL | 0 refills | Status: AC

## 2017-11-09 MED ORDER — PAROXETINE HCL 10 MG PO TABS
10.0000 mg | ORAL_TABLET | Freq: Every day | ORAL | Status: DC
  Administered 2017-11-09 – 2017-11-10 (×2): 10 mg via ORAL
  Filled 2017-11-09 (×3): qty 1
  Filled 2017-11-09: qty 14
  Filled 2017-11-09: qty 1

## 2017-11-09 NOTE — Progress Notes (Addendum)
D. Pt presents with a flat affect and anxious mood- friendly, calm and cooperative during interactions- observed interacting well with peers in the dayroom. Pt reports having slept poorly last night- endorsing low energy and poor concentration. Per pt's self inventory, pt rates her depression, hopelessness and anxiety a 6/3/6, respectively. Pt observed participating in am group led by SW. Pt currently denies SI/HI and AVH and agrees to contact staff before acting on any harmful thoughts.  A. Labs and vitals monitored. Pt compliant with medications. Pt supported emotionally and encouraged to express concerns and ask questions.   R. Pt remains safe with 15 minute checks. Will continue POC.

## 2017-11-09 NOTE — Progress Notes (Signed)
Adult Psychoeducational Group Note  Date:  11/09/2017 Time:  10:51 PM  Group Topic/Focus:  Wrap-Up Group:   The focus of this group is to help patients review their daily goal of treatment and discuss progress on daily workbooks.  Participation Level:  Active  Participation Quality:  Appropriate  Affect:  Appropriate  Cognitive:  Appropriate  Insight: Appropriate  Engagement in Group:  Engaged  Modes of Intervention:  Socialization and Support  Additional Comments:  Patient attended and participated in group tonight. She reports having a good day. She watched television and talked with people in the day room. She talked to her kids today and they are doing well which made her feel better.  Megan Edwards, Megan Edwards [facility] 11/09/2017, 10:51 PM

## 2017-11-09 NOTE — Progress Notes (Signed)
Writer has observed patient up in the dayroom laughing and talking with her roommate and others. She is looking forward to discharge on tomorrow morning. She was compliant with medications and voiced no concerns. Support given and safety maintained on unit with 15 min checks.
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
## 2017-11-09 NOTE — [facility] Suicide Risk Assessment (Signed)
[facility]   Principal Problem: Schizoaffective disorder, bipolar type [facility](HCC) Discharge Diagnoses:  Patient Active Problem List   Diagnosis Date Noted  . Schizoaffective disorder, bipolar type (HCC) [F25.0] 03/30/2017  . Alcohol abuse with alcohol-induced mood disorder (HCC) [F10.14] 01/18/2017  . Pulmonary embolism (HCC) [I26.99] 12/19/2016  . Alcohol withdrawal (HCC) [F10.239] 12/01/2016  . ADD (attention deficit disorder) [F98.8] 05/10/2016  . Opioid use disorder, mild, in controlled environment (HCC) [F11.10] 05/06/2016  . Essential hypertension [I10] 05/06/2016  . Alcohol use disorder, moderate, in early remission, dependence (HCC) [F10.21] 05/06/2016  . Chronic pain syndrome [G89.4]     Total Time spent with patient: 20 minutes  Musculoskeletal: Strength & Muscle Tone: within normal limits Gait & Station: normal Patient leans: N/A  Psychiatric Specialty Exam: Review of Systems  All other systems reviewed and are negative.   Blood pressure 122/88, pulse 75, temperature 98.6 F (37 C), temperature source Oral, resp. rate 16, height 5\' 7"  (1.702 m), weight 90.7 kg, SpO2 96 %.Body mass index is 31.32 kg/m.  General Appearance: Casual  Eye Contact::  Good  Speech:  Normal Rate409  Volume:  Normal  Mood:  Euthymic  Affect:  Congruent  Thought Process:  Coherent and Descriptions of Associations: Intact  Orientation:  Full (Time, Place, and Person)  Thought Content:  Logical  Suicidal Thoughts:  No  Homicidal Thoughts:  No  Memory:  Immediate;   Fair Recent;   Fair Remote;   Fair  Judgement:  Intact  Insight:  Fair  Psychomotor Activity:  Normal  Concentration:  Fair  Recall:  [employer]  Fund of Knowledge:Fair  Language: Good  Akathisia:  Negative  Handed:  Right  AIMS (if indicated):     Assets:  Communication Skills Desire for Improvement Housing Physical Health Resilience  Sleep:  Number of Hours: 6.5  Cognition: WNL  ADL's:  Intact    Mental Status Per Nursing Assessment::   On Admission:  Suicidal ideation indicated by patient, Suicide plan  Demographic Factors:  Caucasian and Unemployed  Loss Factors: NA  Historical Factors: Prior suicide attempts and Impulsivity  Risk Reduction Factors:   Positive coping skills or problem solving skills  Continued Clinical Symptoms:  Alcohol/Substance Abuse/Dependencies  Cognitive Features That Contribute To Risk:  None    Suicide Risk:  Minimal: No identifiable suicidal ideation.  Patients presenting with no risk factors but with morbid ruminations; may be classified as minimal risk based on the severity of the depressive symptoms  Follow-up Information    [facility] Follow up on 11/10/2017.   Why:  Monday at 7:45 AM for your screening for admission appointment Contact information: [address] [place] [address] [phone]           Plan Of Care/Follow-up recommendations:  Activity:  ad lib  Antonieta PertGreg Lawson Latifah Padin, MD 11/09/2017, 1:39 PM

## 2017-11-09 NOTE — B[facility] Group Notes (Signed)
[facility] LCSW Group Therapy Note2  Date/Time:  11/09/2017  11:00AM-12:00PM  Type of Therapy and Topic:  Group Therapy:  Music and Mood  Participation Level:  Active   Description of Group: In this process group, members listened to a variety of genres of music and identified that different types of music evoke different responses.  Patients were encouraged to identify music that was soothing for them and music that was energizing for them.  Patients discussed how this knowledge can help with wellness and recovery in various ways including managing depression and anxiety as well as encouraging healthy sleep habits.    Therapeutic Goals: 1. Patients will explore the impact of different varieties of music on mood 2. Patients will verbalize the thoughts they have when listening to different types of music 3. Patients will identify music that is soothing to them as well as music that is energizing to them 4. Patients will discuss how to use this knowledge to assist in maintaining wellness and recovery 5. Patients will explore the use of music as a coping skill  Summary of Patient Progress:  At the beginning of group, patient expressed that she was optimistic about leaving tomorrow for a rehab program, but she was also anxiety about events in her life, and rated this a 7 out of 10.  At the end of group she said she felt better and rated her anxiety at a 4.  Therapeutic Modalities: Solution Focused Brief Therapy Activity   Ambrose MantleMareida Grossman-Orr, LCSW

## 2017-11-10 NOTE — Progress Notes (Signed)
[facility] Writer returned all her belongings from locker. Writer went over her discharge paperwork and explained medications and resources available if needed. She has her mother picking her up and is looking forward to attending [facility]. She currently denies si/hi/a/v hallucinations. She was discharged to lobby to mother

## 2017-11-10 NOTE — Progress Notes (Signed)
  [facility] :  Will you be returning to the same living situation after discharge:  No. [facility] At discharge, do you have transportation home?: Yes,  friend Do you have the ability to pay for your medications: Yes,  MCD  Release of information consent forms completed and in the chart;  Patient's signature needed at discharge.  Patient to Follow up at: Follow-up Information    [facility] Follow up on 11/10/2017.   Why:  Monday at 7:45 AM for your screening for admission appointment Contact information: [address] [place] [address] [phone]           Next level of care provider [facility] Link:no  Safety Planning and Suicide Prevention discussed: Yes,  yes  Have you used any form of tobacco in the last 30 days? (Cigarettes, Smokeless Tobacco, Cigars, and/or Pipes): No  Has patient been referred to the Quitline?: N/A patient is not a smoker  Patient has been referred for addiction treatment: Yes  Ida RogueRodney B Doral Ventrella, LCSW 11/10/2017, 8:19 AM
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
## 2018-02-09 IMAGING — CT CT HEAD WO/W CM
3 of 4 series · 14 of 47 positions shown, 16 images · IV contrast (iopamidol)
Comparison: Neck CT with contrast 12/14/2015. Head CT without
contrast 08/12/2015 and earlier.

CLINICAL DATA: 40-year-old female with hyperprolactin level and
amenorrhea. Headaches and blurred vision for 1 year. Facial
swelling.

EXAM:
CT HEAD WITHOUT AND WITH CONTRAST
TECHNIQUE: Contiguous axial images were obtained from the base of the skull
through the vertex without and with intravenous contrast
CONTRAST:  75mL 5O4IO2-TII IOPAMIDOL (5O4IO2-TII) INJECTION 61%

[Series 2: head wo · axial · 0.47mm/px · z∈[-97,+33]mm · 8 of 32 slices shown, 10 images]
[im 3/32  brain]
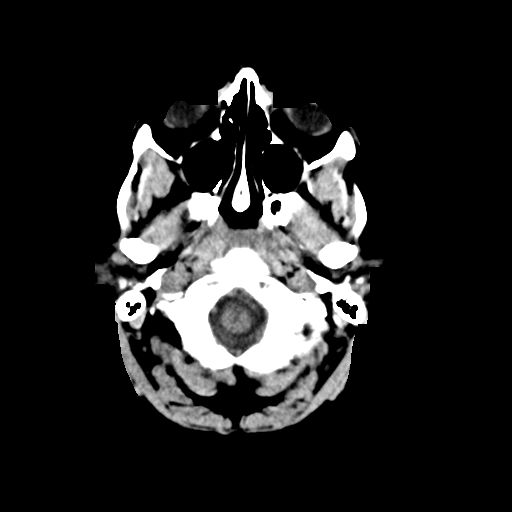
[im 3/32  bone]
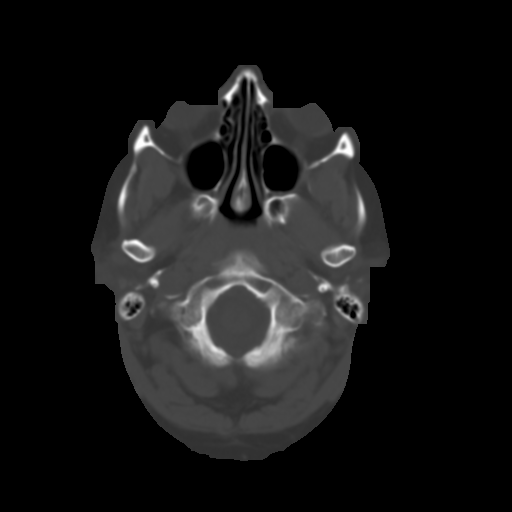
[im 7/32  brain]
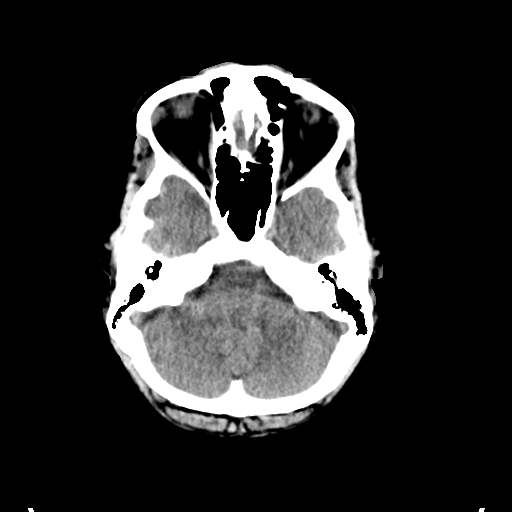
[im 12/32  brain]
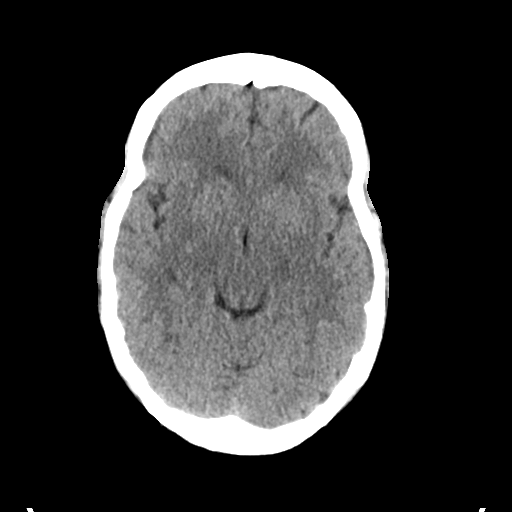
[im 14/32  brain]
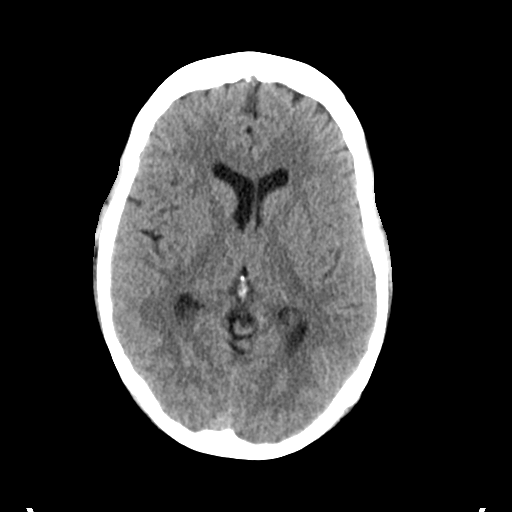
[im 18/32  brain]
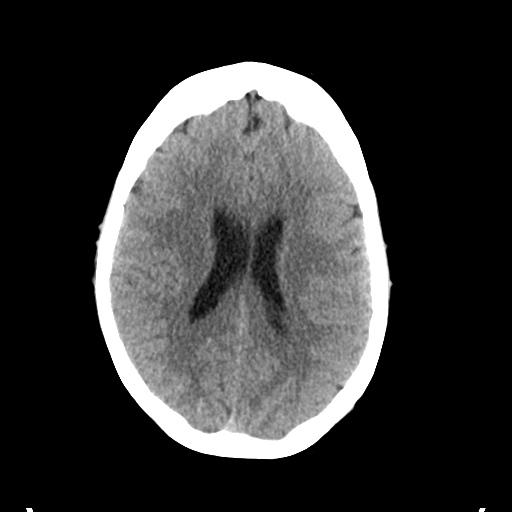
[im 18/32  bone]
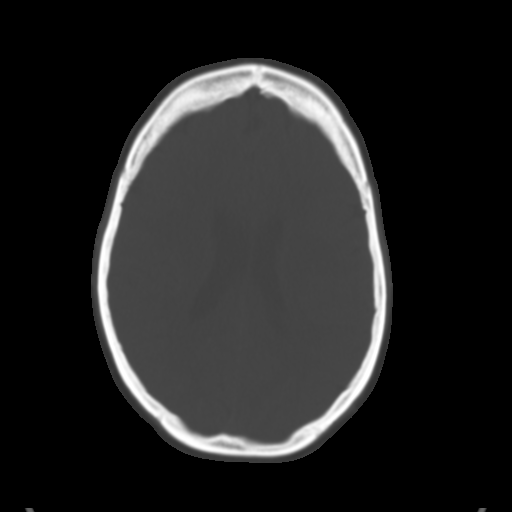
[im 20/32  brain]
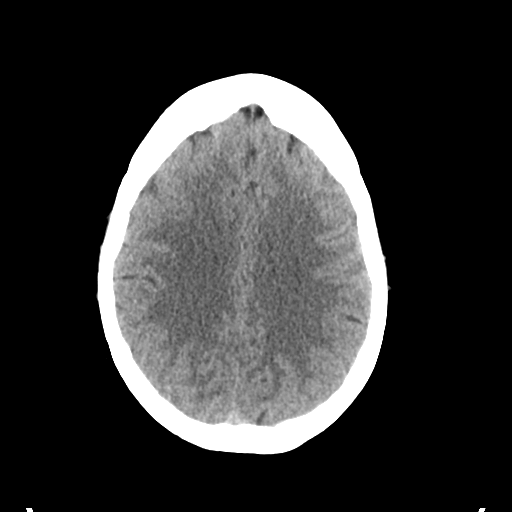
[im 25/32  brain]
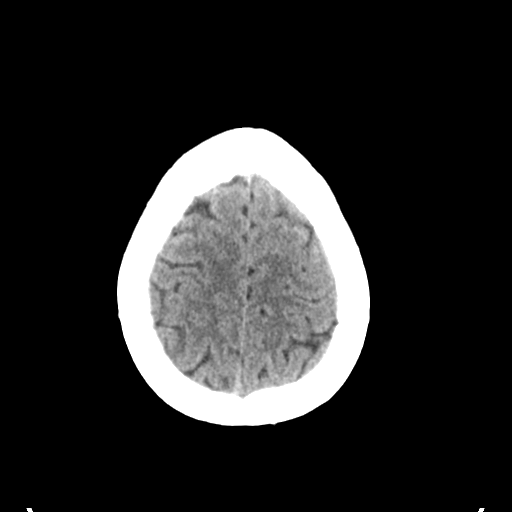
[im 29/32  brain]
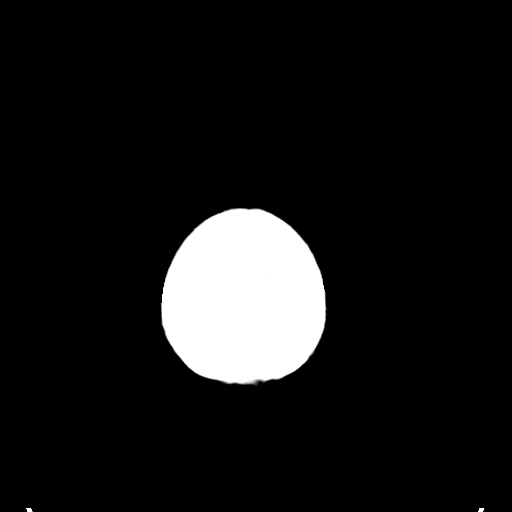

[Series 5: coronal soft tissue · coronal · 0.30mm/px · 3 of 69 slices shown]
[im 23/69  brain]
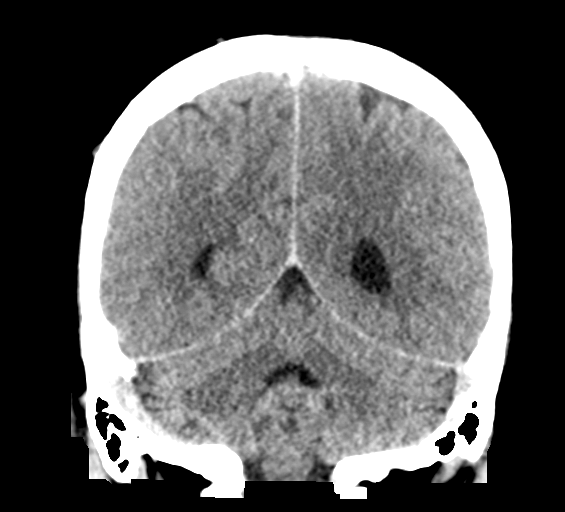
[im 31/69  brain]
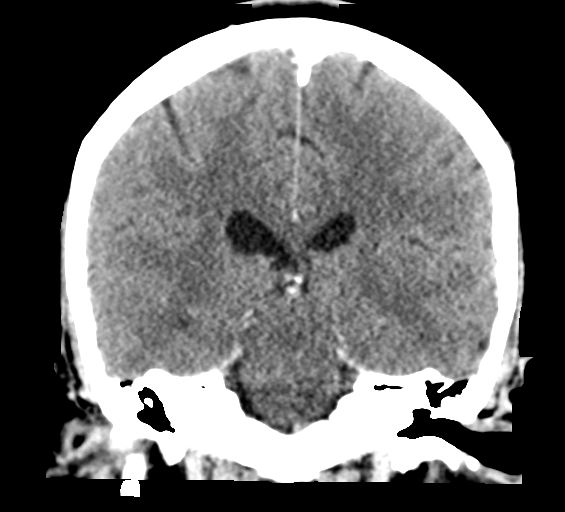
[im 38/69  brain]
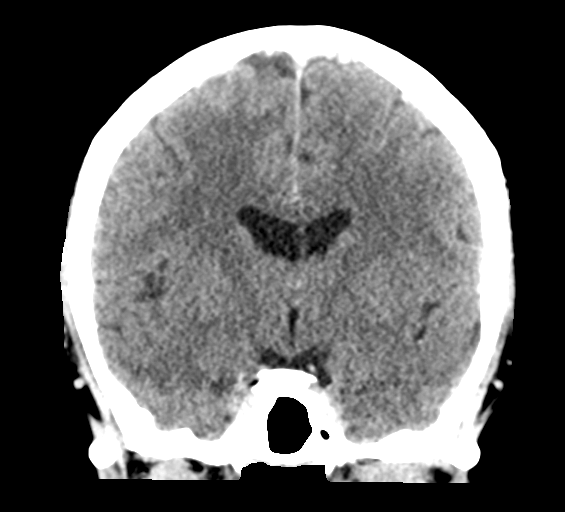

[Series 6: sagittal soft tissue · sagittal · 0.31mm/px · 3 of 67 slices shown]
[im 23/67  brain]
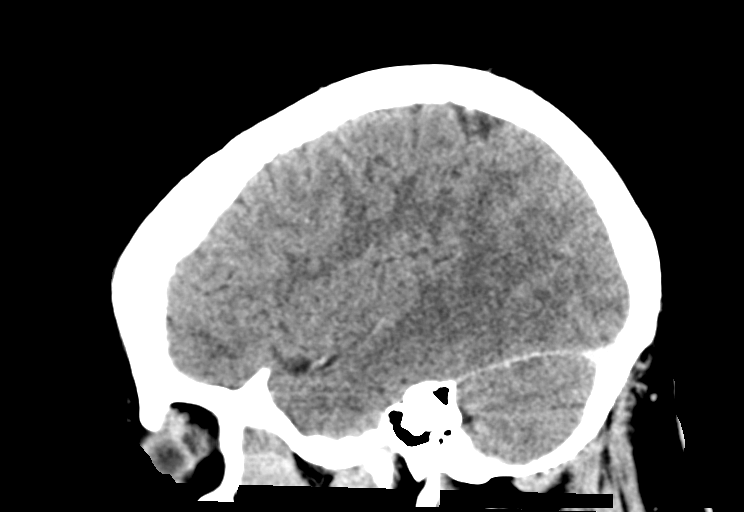
[im 34/67  brain]
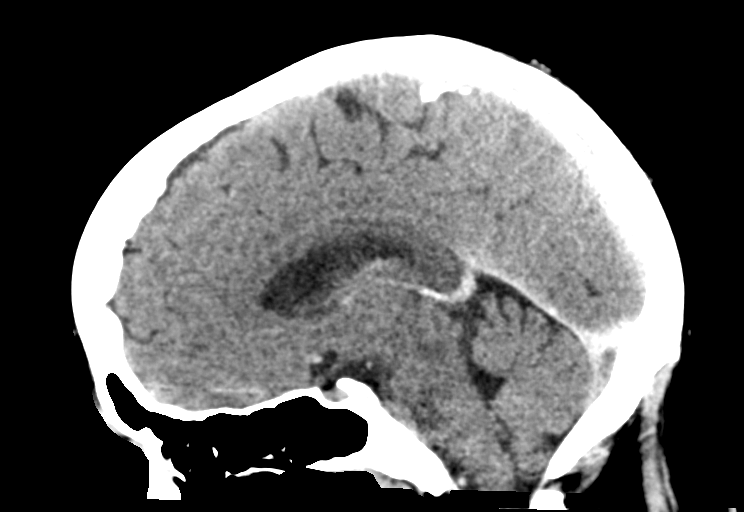
[im 45/67  brain]
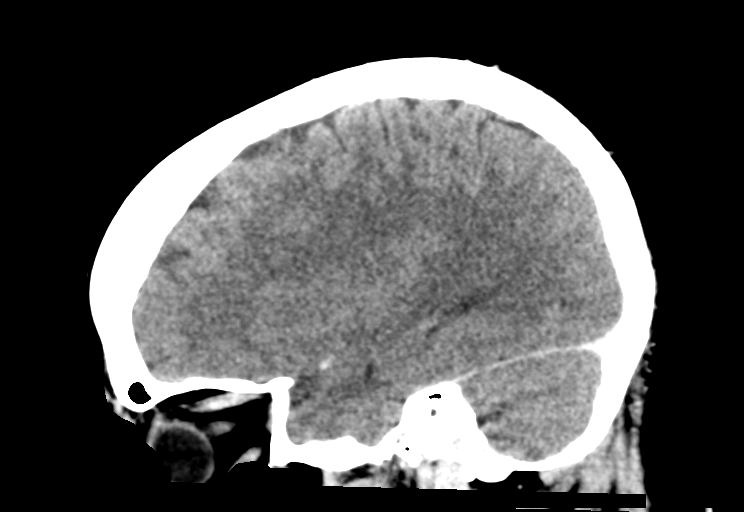

[14 of 47 positions shown; findings below may reference images not displayed]

FINDINGS: Brain: Normal CT appearance of the pituitary. There is no evidence
of sellar or suprasellar mass or mass effect. Stable cerebral
volume. No midline shift, ventriculomegaly, mass effect, evidence of
mass lesion, intracranial hemorrhage or evidence of cortically based
acute infarction. Gray-white matter differentiation is within normal
limits throughout the brain. No abnormal enhancement identified.

Vascular: Major intracranial vascular structures are enhancing.

Skull: Negative.  No osseous abnormality identified.

Sinuses/Orbits: Visualized paranasal sinuses and mastoids are stable
and well pneumatized.

Other: Negative visualized orbit and scalp soft tissues.
IMPRESSION: Normal CT appearance of the brain.

No pituitary lesion is evident, but a Brain MRI using dedicated
pituitary protocol without and with contrast would be most
sensitive.

## 2018-03-25 DEATH — deceased
